# Patient Record
Sex: Female | Born: 1945 | Race: Black or African American | Hispanic: No | Marital: Married | Smoking: Former smoker
Health system: Southern US, Community
[De-identification: ages and names within clinical notes are randomized; demographics above are authoritative.]

## PROBLEM LIST (undated history)

## (undated) DIAGNOSIS — I10 Essential (primary) hypertension: Secondary | ICD-10-CM

## (undated) DIAGNOSIS — K219 Gastro-esophageal reflux disease without esophagitis: Secondary | ICD-10-CM

## (undated) DIAGNOSIS — M542 Cervicalgia: Secondary | ICD-10-CM

## (undated) DIAGNOSIS — M549 Dorsalgia, unspecified: Secondary | ICD-10-CM

## (undated) DIAGNOSIS — M1990 Unspecified osteoarthritis, unspecified site: Secondary | ICD-10-CM

## (undated) DIAGNOSIS — R42 Dizziness and giddiness: Secondary | ICD-10-CM

## (undated) DIAGNOSIS — Z9289 Personal history of other medical treatment: Secondary | ICD-10-CM

## (undated) DIAGNOSIS — J449 Chronic obstructive pulmonary disease, unspecified: Secondary | ICD-10-CM

## (undated) DIAGNOSIS — C801 Malignant (primary) neoplasm, unspecified: Secondary | ICD-10-CM

## (undated) DIAGNOSIS — R0602 Shortness of breath: Secondary | ICD-10-CM

## (undated) DIAGNOSIS — R51 Headache: Secondary | ICD-10-CM

## (undated) DIAGNOSIS — Z8719 Personal history of other diseases of the digestive system: Secondary | ICD-10-CM

## (undated) DIAGNOSIS — G8929 Other chronic pain: Secondary | ICD-10-CM

## (undated) DIAGNOSIS — Z87442 Personal history of urinary calculi: Secondary | ICD-10-CM

## (undated) DIAGNOSIS — R059 Cough, unspecified: Secondary | ICD-10-CM

## (undated) DIAGNOSIS — R002 Palpitations: Secondary | ICD-10-CM

## (undated) DIAGNOSIS — R0600 Dyspnea, unspecified: Secondary | ICD-10-CM

## (undated) DIAGNOSIS — F419 Anxiety disorder, unspecified: Secondary | ICD-10-CM

## (undated) DIAGNOSIS — IMO0002 Reserved for concepts with insufficient information to code with codable children: Secondary | ICD-10-CM

## (undated) DIAGNOSIS — R05 Cough: Secondary | ICD-10-CM

## (undated) HISTORY — PX: COLONOSCOPY: SHX174

## (undated) HISTORY — PX: ABDOMINAL HYSTERECTOMY: SHX81

## (undated) HISTORY — PX: EYE SURGERY: SHX253

## (undated) HISTORY — PX: CHOLECYSTECTOMY: SHX55

## (undated) HISTORY — PX: OTHER SURGICAL HISTORY: SHX169

## (undated) HISTORY — PX: FRACTURE SURGERY: SHX138

## (undated) HISTORY — PX: TONSILLECTOMY: SUR1361

## (undated) HISTORY — PX: KNEE SURGERY: SHX244

---

## 1998-04-17 ENCOUNTER — Ambulatory Visit (HOSPITAL_COMMUNITY): Admission: RE | Admit: 1998-04-17 | Discharge: 1998-04-17 | Payer: Self-pay | Admitting: Gastroenterology

## 1998-04-28 ENCOUNTER — Emergency Department (HOSPITAL_COMMUNITY): Admission: EM | Admit: 1998-04-28 | Discharge: 1998-04-28 | Payer: Self-pay | Admitting: Emergency Medicine

## 1998-05-30 ENCOUNTER — Ambulatory Visit (HOSPITAL_BASED_OUTPATIENT_CLINIC_OR_DEPARTMENT_OTHER): Admission: RE | Admit: 1998-05-30 | Discharge: 1998-05-30 | Payer: Self-pay | Admitting: Urology

## 1998-08-21 ENCOUNTER — Ambulatory Visit (HOSPITAL_COMMUNITY): Admission: RE | Admit: 1998-08-21 | Discharge: 1998-08-21 | Payer: Self-pay | Admitting: Cardiovascular Disease

## 1998-09-01 ENCOUNTER — Ambulatory Visit (HOSPITAL_COMMUNITY): Admission: RE | Admit: 1998-09-01 | Discharge: 1998-09-01 | Payer: Self-pay | Admitting: Neurosurgery

## 1998-09-01 ENCOUNTER — Encounter: Payer: Self-pay | Admitting: Neurosurgery

## 1998-09-04 ENCOUNTER — Other Ambulatory Visit: Admission: RE | Admit: 1998-09-04 | Discharge: 1998-09-04 | Payer: Self-pay | Admitting: Obstetrics

## 1998-09-13 ENCOUNTER — Encounter: Payer: Self-pay | Admitting: Urology

## 1998-09-13 ENCOUNTER — Ambulatory Visit (HOSPITAL_COMMUNITY): Admission: RE | Admit: 1998-09-13 | Discharge: 1998-09-13 | Payer: Self-pay | Admitting: Urology

## 1998-10-28 ENCOUNTER — Encounter: Admission: RE | Admit: 1998-10-28 | Discharge: 1998-12-11 | Payer: Self-pay | Admitting: Neurosurgery

## 1999-01-22 ENCOUNTER — Ambulatory Visit (HOSPITAL_BASED_OUTPATIENT_CLINIC_OR_DEPARTMENT_OTHER): Admission: RE | Admit: 1999-01-22 | Discharge: 1999-01-22 | Payer: Self-pay | Admitting: *Deleted

## 1999-10-22 ENCOUNTER — Other Ambulatory Visit: Admission: RE | Admit: 1999-10-22 | Discharge: 1999-10-22 | Payer: Self-pay | Admitting: Urology

## 2000-02-09 ENCOUNTER — Encounter: Payer: Self-pay | Admitting: Neurosurgery

## 2000-02-09 ENCOUNTER — Encounter: Admission: RE | Admit: 2000-02-09 | Discharge: 2000-02-09 | Payer: Self-pay | Admitting: Neurosurgery

## 2000-02-13 ENCOUNTER — Encounter: Admission: RE | Admit: 2000-02-13 | Discharge: 2000-03-23 | Payer: Self-pay | Admitting: Neurosurgery

## 2000-04-21 ENCOUNTER — Encounter: Payer: Self-pay | Admitting: Cardiovascular Disease

## 2000-04-21 ENCOUNTER — Encounter: Admission: RE | Admit: 2000-04-21 | Discharge: 2000-04-21 | Payer: Self-pay | Admitting: Cardiovascular Disease

## 2000-09-21 ENCOUNTER — Ambulatory Visit (HOSPITAL_COMMUNITY): Admission: RE | Admit: 2000-09-21 | Discharge: 2000-09-21 | Payer: Self-pay | Admitting: Gastroenterology

## 2000-10-04 ENCOUNTER — Emergency Department (HOSPITAL_COMMUNITY): Admission: EM | Admit: 2000-10-04 | Discharge: 2000-10-05 | Payer: Self-pay | Admitting: Emergency Medicine

## 2000-10-04 ENCOUNTER — Encounter: Payer: Self-pay | Admitting: Gastroenterology

## 2000-10-04 ENCOUNTER — Encounter: Admission: RE | Admit: 2000-10-04 | Discharge: 2000-10-04 | Payer: Self-pay | Admitting: Gastroenterology

## 2000-10-05 ENCOUNTER — Encounter: Payer: Self-pay | Admitting: Emergency Medicine

## 2000-11-24 ENCOUNTER — Encounter: Admission: RE | Admit: 2000-11-24 | Discharge: 2000-11-24 | Payer: Self-pay | Admitting: Cardiovascular Disease

## 2000-11-24 ENCOUNTER — Encounter: Payer: Self-pay | Admitting: Cardiovascular Disease

## 2001-01-19 ENCOUNTER — Encounter: Payer: Self-pay | Admitting: Emergency Medicine

## 2001-01-19 ENCOUNTER — Emergency Department (HOSPITAL_COMMUNITY): Admission: EM | Admit: 2001-01-19 | Discharge: 2001-01-20 | Payer: Self-pay | Admitting: Emergency Medicine

## 2001-09-27 ENCOUNTER — Encounter: Payer: Self-pay | Admitting: Gastroenterology

## 2001-09-27 ENCOUNTER — Ambulatory Visit (HOSPITAL_COMMUNITY): Admission: RE | Admit: 2001-09-27 | Discharge: 2001-09-27 | Payer: Self-pay | Admitting: Gastroenterology

## 2001-11-01 ENCOUNTER — Encounter (INDEPENDENT_AMBULATORY_CARE_PROVIDER_SITE_OTHER): Payer: Self-pay

## 2001-11-01 ENCOUNTER — Observation Stay (HOSPITAL_COMMUNITY): Admission: RE | Admit: 2001-11-01 | Discharge: 2001-11-02 | Payer: Self-pay | Admitting: Surgery

## 2001-12-26 ENCOUNTER — Encounter: Admission: RE | Admit: 2001-12-26 | Discharge: 2001-12-26 | Payer: Self-pay | Admitting: Gastroenterology

## 2001-12-26 ENCOUNTER — Encounter: Payer: Self-pay | Admitting: Gastroenterology

## 2002-11-23 HISTORY — PX: KNEE ARTHROSCOPY: SHX127

## 2003-05-16 ENCOUNTER — Encounter: Admission: RE | Admit: 2003-05-16 | Discharge: 2003-05-16 | Payer: Self-pay | Admitting: Gastroenterology

## 2003-05-16 ENCOUNTER — Encounter: Payer: Self-pay | Admitting: Gastroenterology

## 2003-11-04 ENCOUNTER — Emergency Department (HOSPITAL_COMMUNITY): Admission: EM | Admit: 2003-11-04 | Discharge: 2003-11-04 | Payer: Self-pay | Admitting: Emergency Medicine

## 2003-11-22 ENCOUNTER — Emergency Department (HOSPITAL_COMMUNITY): Admission: EM | Admit: 2003-11-22 | Discharge: 2003-11-23 | Payer: Self-pay | Admitting: Emergency Medicine

## 2004-02-02 ENCOUNTER — Emergency Department (HOSPITAL_COMMUNITY): Admission: EM | Admit: 2004-02-02 | Discharge: 2004-02-03 | Payer: Self-pay | Admitting: Emergency Medicine

## 2004-05-10 ENCOUNTER — Emergency Department (HOSPITAL_COMMUNITY): Admission: EM | Admit: 2004-05-10 | Discharge: 2004-05-10 | Payer: Self-pay | Admitting: Family Medicine

## 2004-06-13 ENCOUNTER — Emergency Department (HOSPITAL_COMMUNITY): Admission: EM | Admit: 2004-06-13 | Discharge: 2004-06-14 | Payer: Self-pay | Admitting: Emergency Medicine

## 2004-06-17 ENCOUNTER — Encounter: Admission: RE | Admit: 2004-06-17 | Discharge: 2004-06-17 | Payer: Self-pay | Admitting: Cardiovascular Disease

## 2004-06-24 ENCOUNTER — Encounter: Admission: RE | Admit: 2004-06-24 | Discharge: 2004-06-24 | Payer: Self-pay | Admitting: Cardiovascular Disease

## 2005-04-20 ENCOUNTER — Emergency Department (HOSPITAL_COMMUNITY): Admission: EM | Admit: 2005-04-20 | Discharge: 2005-04-21 | Payer: Self-pay | Admitting: Emergency Medicine

## 2005-05-08 ENCOUNTER — Encounter: Admission: RE | Admit: 2005-05-08 | Discharge: 2005-05-08 | Payer: Self-pay | Admitting: Neurosurgery

## 2005-05-16 ENCOUNTER — Emergency Department (HOSPITAL_COMMUNITY): Admission: EM | Admit: 2005-05-16 | Discharge: 2005-05-16 | Payer: Self-pay | Admitting: Family Medicine

## 2005-09-19 ENCOUNTER — Emergency Department (HOSPITAL_COMMUNITY): Admission: EM | Admit: 2005-09-19 | Discharge: 2005-09-19 | Payer: Self-pay | Admitting: *Deleted

## 2005-11-23 ENCOUNTER — Emergency Department (HOSPITAL_COMMUNITY): Admission: EM | Admit: 2005-11-23 | Discharge: 2005-11-23 | Payer: Self-pay | Admitting: Family Medicine

## 2006-07-25 ENCOUNTER — Emergency Department (HOSPITAL_COMMUNITY): Admission: EM | Admit: 2006-07-25 | Discharge: 2006-07-25 | Payer: Self-pay | Admitting: Emergency Medicine

## 2006-09-15 ENCOUNTER — Encounter (INDEPENDENT_AMBULATORY_CARE_PROVIDER_SITE_OTHER): Payer: Self-pay | Admitting: Specialist

## 2006-09-15 ENCOUNTER — Ambulatory Visit (HOSPITAL_COMMUNITY): Admission: RE | Admit: 2006-09-15 | Discharge: 2006-09-15 | Payer: Self-pay | Admitting: Gastroenterology

## 2006-10-23 ENCOUNTER — Emergency Department (HOSPITAL_COMMUNITY): Admission: EM | Admit: 2006-10-23 | Discharge: 2006-10-23 | Payer: Self-pay | Admitting: Family Medicine

## 2006-12-01 ENCOUNTER — Emergency Department (HOSPITAL_COMMUNITY): Admission: EM | Admit: 2006-12-01 | Discharge: 2006-12-01 | Payer: Self-pay | Admitting: Emergency Medicine

## 2006-12-15 ENCOUNTER — Inpatient Hospital Stay (HOSPITAL_COMMUNITY): Admission: EM | Admit: 2006-12-15 | Discharge: 2006-12-16 | Payer: Self-pay | Admitting: Emergency Medicine

## 2007-07-31 ENCOUNTER — Encounter: Admission: RE | Admit: 2007-07-31 | Discharge: 2007-07-31 | Payer: Self-pay | Admitting: Neurosurgery

## 2007-08-14 ENCOUNTER — Encounter: Admission: RE | Admit: 2007-08-14 | Discharge: 2007-08-14 | Payer: Self-pay | Admitting: Neurosurgery

## 2007-08-25 ENCOUNTER — Encounter: Admission: RE | Admit: 2007-08-25 | Discharge: 2007-08-25 | Payer: Self-pay | Admitting: Cardiovascular Disease

## 2007-09-16 ENCOUNTER — Ambulatory Visit (HOSPITAL_BASED_OUTPATIENT_CLINIC_OR_DEPARTMENT_OTHER): Admission: RE | Admit: 2007-09-16 | Discharge: 2007-09-16 | Payer: Self-pay | Admitting: Urology

## 2007-09-23 ENCOUNTER — Ambulatory Visit: Payer: Self-pay | Admitting: *Deleted

## 2007-09-23 ENCOUNTER — Encounter (INDEPENDENT_AMBULATORY_CARE_PROVIDER_SITE_OTHER): Payer: Self-pay | Admitting: Cardiovascular Disease

## 2007-09-23 ENCOUNTER — Ambulatory Visit (HOSPITAL_COMMUNITY): Admission: RE | Admit: 2007-09-23 | Discharge: 2007-09-23 | Payer: Self-pay | Admitting: Cardiovascular Disease

## 2007-09-27 ENCOUNTER — Inpatient Hospital Stay (HOSPITAL_COMMUNITY): Admission: EM | Admit: 2007-09-27 | Discharge: 2007-09-29 | Payer: Self-pay | Admitting: Emergency Medicine

## 2008-04-30 ENCOUNTER — Emergency Department (HOSPITAL_COMMUNITY): Admission: EM | Admit: 2008-04-30 | Discharge: 2008-04-30 | Payer: Self-pay | Admitting: Emergency Medicine

## 2008-05-12 ENCOUNTER — Emergency Department (HOSPITAL_COMMUNITY): Admission: EM | Admit: 2008-05-12 | Discharge: 2008-05-12 | Payer: Self-pay | Admitting: Emergency Medicine

## 2008-07-13 ENCOUNTER — Emergency Department (HOSPITAL_COMMUNITY): Admission: EM | Admit: 2008-07-13 | Discharge: 2008-07-14 | Payer: Self-pay | Admitting: Plastic Surgery

## 2008-07-22 ENCOUNTER — Emergency Department (HOSPITAL_COMMUNITY): Admission: EM | Admit: 2008-07-22 | Discharge: 2008-07-22 | Payer: Self-pay | Admitting: Family Medicine

## 2008-10-06 ENCOUNTER — Emergency Department (HOSPITAL_COMMUNITY): Admission: EM | Admit: 2008-10-06 | Discharge: 2008-10-06 | Payer: Self-pay | Admitting: Emergency Medicine

## 2008-11-23 DIAGNOSIS — Z9289 Personal history of other medical treatment: Secondary | ICD-10-CM

## 2008-11-23 HISTORY — DX: Personal history of other medical treatment: Z92.89

## 2008-12-09 ENCOUNTER — Emergency Department (HOSPITAL_COMMUNITY): Admission: EM | Admit: 2008-12-09 | Discharge: 2008-12-09 | Payer: Self-pay | Admitting: Emergency Medicine

## 2008-12-31 ENCOUNTER — Inpatient Hospital Stay (HOSPITAL_COMMUNITY): Admission: AD | Admit: 2008-12-31 | Discharge: 2009-01-02 | Payer: Self-pay | Admitting: Cardiovascular Disease

## 2009-01-01 ENCOUNTER — Encounter (INDEPENDENT_AMBULATORY_CARE_PROVIDER_SITE_OTHER): Payer: Self-pay | Admitting: Cardiovascular Disease

## 2009-01-15 ENCOUNTER — Encounter: Admission: RE | Admit: 2009-01-15 | Discharge: 2009-01-15 | Payer: Self-pay | Admitting: Cardiovascular Disease

## 2009-04-11 ENCOUNTER — Encounter: Admission: RE | Admit: 2009-04-11 | Discharge: 2009-04-11 | Payer: Self-pay

## 2009-04-23 ENCOUNTER — Encounter: Admission: RE | Admit: 2009-04-23 | Discharge: 2009-05-16 | Payer: Self-pay | Admitting: Neurosurgery

## 2009-08-26 ENCOUNTER — Encounter: Admission: RE | Admit: 2009-08-26 | Discharge: 2009-08-26 | Payer: Self-pay | Admitting: Unknown Physician Specialty

## 2010-02-20 ENCOUNTER — Ambulatory Visit (HOSPITAL_BASED_OUTPATIENT_CLINIC_OR_DEPARTMENT_OTHER): Admission: RE | Admit: 2010-02-20 | Discharge: 2010-02-20 | Payer: Self-pay | Admitting: General Surgery

## 2010-04-03 ENCOUNTER — Emergency Department (HOSPITAL_COMMUNITY): Admission: EM | Admit: 2010-04-03 | Discharge: 2010-04-03 | Payer: Self-pay | Admitting: Emergency Medicine

## 2010-11-08 ENCOUNTER — Emergency Department (HOSPITAL_COMMUNITY)
Admission: EM | Admit: 2010-11-08 | Discharge: 2010-11-08 | Payer: Self-pay | Source: Home / Self Care | Admitting: Emergency Medicine

## 2010-12-11 ENCOUNTER — Encounter
Admission: RE | Admit: 2010-12-11 | Discharge: 2010-12-11 | Payer: Self-pay | Source: Home / Self Care | Attending: Specialist | Admitting: Specialist

## 2010-12-17 ENCOUNTER — Encounter
Admission: RE | Admit: 2010-12-17 | Discharge: 2010-12-23 | Payer: Self-pay | Source: Home / Self Care | Attending: Specialist | Admitting: Specialist

## 2010-12-24 ENCOUNTER — Ambulatory Visit: Payer: Medicare Other | Attending: Physical Medicine and Rehabilitation | Admitting: Rehabilitation

## 2010-12-24 DIAGNOSIS — M2560 Stiffness of unspecified joint, not elsewhere classified: Secondary | ICD-10-CM | POA: Insufficient documentation

## 2010-12-24 DIAGNOSIS — IMO0001 Reserved for inherently not codable concepts without codable children: Secondary | ICD-10-CM | POA: Insufficient documentation

## 2010-12-24 DIAGNOSIS — M2550 Pain in unspecified joint: Secondary | ICD-10-CM | POA: Insufficient documentation

## 2010-12-25 ENCOUNTER — Ambulatory Visit: Payer: Medicare Other | Admitting: Rehabilitation

## 2010-12-30 ENCOUNTER — Ambulatory Visit: Payer: Medicare Other | Admitting: Rehabilitation

## 2011-01-01 ENCOUNTER — Encounter: Payer: Medicare Other | Admitting: Rehabilitation

## 2011-01-06 ENCOUNTER — Ambulatory Visit: Payer: Medicare Other | Admitting: Rehabilitation

## 2011-01-08 ENCOUNTER — Encounter: Payer: Medicare Other | Admitting: Rehabilitation

## 2011-01-14 ENCOUNTER — Ambulatory Visit: Payer: Medicare Other | Admitting: Rehabilitation

## 2011-01-15 ENCOUNTER — Ambulatory Visit: Payer: Medicare Other | Admitting: Rehabilitation

## 2011-01-19 ENCOUNTER — Emergency Department (HOSPITAL_COMMUNITY)
Admission: EM | Admit: 2011-01-19 | Discharge: 2011-01-20 | Disposition: A | Discharge status: Home / Self Care | Payer: Medicare Other | Attending: Emergency Medicine | Admitting: Emergency Medicine

## 2011-01-19 DIAGNOSIS — E119 Type 2 diabetes mellitus without complications: Secondary | ICD-10-CM | POA: Insufficient documentation

## 2011-01-19 DIAGNOSIS — I10 Essential (primary) hypertension: Secondary | ICD-10-CM | POA: Insufficient documentation

## 2011-01-19 DIAGNOSIS — R42 Dizziness and giddiness: Secondary | ICD-10-CM | POA: Insufficient documentation

## 2011-01-19 DIAGNOSIS — R51 Headache: Secondary | ICD-10-CM | POA: Insufficient documentation

## 2011-01-19 DIAGNOSIS — K219 Gastro-esophageal reflux disease without esophagitis: Secondary | ICD-10-CM | POA: Insufficient documentation

## 2011-01-19 DIAGNOSIS — Z79899 Other long term (current) drug therapy: Secondary | ICD-10-CM | POA: Insufficient documentation

## 2011-01-20 ENCOUNTER — Ambulatory Visit: Payer: Medicare Other | Admitting: Rehabilitation

## 2011-01-20 ENCOUNTER — Emergency Department (HOSPITAL_COMMUNITY): Payer: Medicare Other

## 2011-01-20 LAB — DIFFERENTIAL
Eosinophils Absolute: 0.3 10*3/uL (ref 0.0–0.7)
Eosinophils Relative: 3 % (ref 0–5)
Lymphocytes Relative: 40 % (ref 12–46)
Lymphs Abs: 4.1 10*3/uL — ABNORMAL HIGH (ref 0.7–4.0)
Monocytes Absolute: 0.4 10*3/uL (ref 0.1–1.0)
Monocytes Relative: 4 % (ref 3–12)

## 2011-01-20 LAB — CBC
HCT: 35.6 % — ABNORMAL LOW (ref 36.0–46.0)
MCH: 29.3 pg (ref 26.0–34.0)
MCV: 89 fL (ref 78.0–100.0)
RDW: 12.6 % (ref 11.5–15.5)
WBC: 10.2 10*3/uL (ref 4.0–10.5)

## 2011-01-20 LAB — BASIC METABOLIC PANEL
BUN: 12 mg/dL (ref 6–23)
Creatinine, Ser: 1.03 mg/dL (ref 0.4–1.2)
GFR calc non Af Amer: 54 mL/min — ABNORMAL LOW (ref >60)
Glucose, Bld: 134 mg/dL — ABNORMAL HIGH (ref 70–99)
Potassium: 3.8 mEq/L (ref 3.5–5.1)

## 2011-01-22 ENCOUNTER — Encounter: Payer: Medicare Other | Admitting: Rehabilitation

## 2011-01-27 ENCOUNTER — Ambulatory Visit: Payer: Medicare Other | Attending: Specialist | Admitting: Rehabilitation

## 2011-01-27 DIAGNOSIS — M2550 Pain in unspecified joint: Secondary | ICD-10-CM | POA: Insufficient documentation

## 2011-01-27 DIAGNOSIS — IMO0001 Reserved for inherently not codable concepts without codable children: Secondary | ICD-10-CM | POA: Insufficient documentation

## 2011-01-27 DIAGNOSIS — M2560 Stiffness of unspecified joint, not elsewhere classified: Secondary | ICD-10-CM | POA: Insufficient documentation

## 2011-01-29 ENCOUNTER — Ambulatory Visit: Payer: Medicare Other | Admitting: Rehabilitation

## 2011-02-03 ENCOUNTER — Ambulatory Visit: Payer: Medicare Other | Admitting: Rehabilitation

## 2011-02-05 ENCOUNTER — Ambulatory Visit: Payer: Medicare Other | Admitting: Rehabilitation

## 2011-02-10 ENCOUNTER — Encounter: Payer: Medicare Other | Admitting: Rehabilitation

## 2011-02-12 ENCOUNTER — Ambulatory Visit: Payer: Medicare Other | Admitting: Rehabilitation

## 2011-02-17 ENCOUNTER — Ambulatory Visit: Payer: Medicare Other | Admitting: Rehabilitation

## 2011-02-19 ENCOUNTER — Encounter: Payer: Medicare Other | Admitting: Rehabilitation

## 2011-02-24 ENCOUNTER — Encounter: Payer: Medicare Other | Admitting: Rehabilitation

## 2011-02-26 ENCOUNTER — Encounter: Payer: Medicare Other | Admitting: Rehabilitation

## 2011-03-09 LAB — COMPREHENSIVE METABOLIC PANEL
ALT: 10 U/L (ref 0–35)
AST: 17 U/L (ref 0–37)
Albumin: 3.8 g/dL (ref 3.5–5.2)
Alkaline Phosphatase: 116 U/L (ref 39–117)
BUN: 13 mg/dL (ref 6–23)
CO2: 24 mEq/L (ref 19–32)
Calcium: 9 mg/dL (ref 8.4–10.5)
Chloride: 98 mEq/L (ref 96–112)
Creatinine, Ser: 1.22 mg/dL — ABNORMAL HIGH (ref 0.4–1.2)
GFR calc Af Amer: 54 mL/min — ABNORMAL LOW (ref >60)
GFR calc non Af Amer: 45 mL/min — ABNORMAL LOW (ref >60)
Glucose, Bld: 137 mg/dL — ABNORMAL HIGH (ref 70–99)
Potassium: 3.1 mEq/L — ABNORMAL LOW (ref 3.5–5.1)
Sodium: 133 mEq/L — ABNORMAL LOW (ref 135–145)
Total Bilirubin: 0.4 mg/dL (ref 0.3–1.2)
Total Protein: 7.4 g/dL (ref 6.0–8.3)

## 2011-03-09 LAB — DIFFERENTIAL
Basophils Absolute: 0.1 10*3/uL (ref 0.0–0.1)
Basophils Relative: 1 % (ref 0–1)
Eosinophils Absolute: 0.4 10*3/uL (ref 0.0–0.7)
Eosinophils Relative: 4 % (ref 0–5)
Lymphocytes Relative: 31 % (ref 12–46)
Lymphs Abs: 3.2 10*3/uL (ref 0.7–4.0)
Monocytes Absolute: 0.6 10*3/uL (ref 0.1–1.0)
Monocytes Relative: 6 % (ref 3–12)
Neutro Abs: 6.1 10*3/uL (ref 1.7–7.7)
Neutrophils Relative %: 58 % (ref 43–77)

## 2011-03-09 LAB — URINE MICROSCOPIC-ADD ON

## 2011-03-09 LAB — URINALYSIS, ROUTINE W REFLEX MICROSCOPIC
Glucose, UA: NEGATIVE mg/dL
Ketones, ur: 15 mg/dL — AB
Nitrite: NEGATIVE
Protein, ur: NEGATIVE mg/dL
Specific Gravity, Urine: 1.024 (ref 1.005–1.030)
Urobilinogen, UA: 1 mg/dL (ref 0.0–1.0)
pH: 5.5 (ref 5.0–8.0)

## 2011-03-09 LAB — CBC
HCT: 38.2 % (ref 36.0–46.0)
Hemoglobin: 12.9 g/dL (ref 12.0–15.0)
MCHC: 33.9 g/dL (ref 30.0–36.0)
MCV: 86.9 fL (ref 78.0–100.0)
Platelets: 394 10*3/uL (ref 150–400)
RBC: 4.39 MIL/uL (ref 3.87–5.11)
RDW: 13.5 % (ref 11.5–15.5)
WBC: 10.4 10*3/uL (ref 4.0–10.5)

## 2011-03-10 LAB — CARDIAC PANEL(CRET KIN+CKTOT+MB+TROPI)
CK, MB: 1.1 ng/mL (ref 0.3–4.0)
Relative Index: 0.8 (ref 0.0–2.5)
Relative Index: 1 (ref 0.0–2.5)
Relative Index: INVALID (ref 0.0–2.5)
Total CK: 110 U/L (ref 7–177)
Troponin I: <0.01 ng/mL (ref 0.00–0.06)
Troponin I: <0.01 ng/mL (ref 0.00–0.06)

## 2011-03-10 LAB — CBC
HCT: 37 % (ref 36.0–46.0)
HCT: 37.7 % (ref 36.0–46.0)
Hemoglobin: 11.5 g/dL — ABNORMAL LOW (ref 12.0–15.0)
Hemoglobin: 12.5 g/dL (ref 12.0–15.0)
Hemoglobin: 12.7 g/dL (ref 12.0–15.0)
MCHC: 34.3 g/dL (ref 30.0–36.0)
MCV: 87.9 fL (ref 78.0–100.0)
MCV: 88 fL (ref 78.0–100.0)
Platelets: 382 10*3/uL (ref 150–400)
RBC: 3.8 MIL/uL — ABNORMAL LOW (ref 3.87–5.11)
RBC: 4.21 MIL/uL (ref 3.87–5.11)
RDW: 13.8 % (ref 11.5–15.5)
RDW: 14.2 % (ref 11.5–15.5)
WBC: 8.1 10*3/uL (ref 4.0–10.5)
WBC: 9.1 10*3/uL (ref 4.0–10.5)

## 2011-03-10 LAB — LIPID PANEL: VLDL: 15 mg/dL (ref 0–40)

## 2011-03-10 LAB — GLUCOSE, CAPILLARY
Glucose-Capillary: 103 mg/dL — ABNORMAL HIGH (ref 70–99)
Glucose-Capillary: 124 mg/dL — ABNORMAL HIGH (ref 70–99)
Glucose-Capillary: 126 mg/dL — ABNORMAL HIGH (ref 70–99)

## 2011-03-10 LAB — DIFFERENTIAL
Basophils Absolute: 0 10*3/uL (ref 0.0–0.1)
Eosinophils Relative: 1 % (ref 0–5)
Lymphocytes Relative: 36 % (ref 12–46)
Lymphs Abs: 3.2 10*3/uL (ref 0.7–4.0)
Monocytes Absolute: 0.4 10*3/uL (ref 0.1–1.0)
Neutro Abs: 5.4 10*3/uL (ref 1.7–7.7)

## 2011-03-10 LAB — BASIC METABOLIC PANEL
Calcium: 9.4 mg/dL (ref 8.4–10.5)
Chloride: 101 mEq/L (ref 96–112)
Creatinine, Ser: 1.06 mg/dL (ref 0.4–1.2)
GFR calc Af Amer: >60 mL/min (ref >60)
GFR calc Af Amer: >60 mL/min (ref >60)
GFR calc non Af Amer: 60 mL/min — ABNORMAL LOW (ref >60)
Potassium: 3.3 mEq/L — ABNORMAL LOW (ref 3.5–5.1)
Potassium: 4 mEq/L (ref 3.5–5.1)
Sodium: 137 mEq/L (ref 135–145)
Sodium: 137 mEq/L (ref 135–145)

## 2011-03-10 LAB — HEMOGLOBIN A1C: Mean Plasma Glucose: 128 mg/dL

## 2011-03-10 LAB — MAGNESIUM: Magnesium: 2.2 mg/dL (ref 1.5–2.5)

## 2011-03-10 LAB — PROTIME-INR: Prothrombin Time: 13.2 seconds (ref 11.6–15.2)

## 2011-03-10 LAB — HEPARIN LEVEL (UNFRACTIONATED): Heparin Unfractionated: 0.31 IU/mL (ref 0.30–0.70)

## 2011-03-10 LAB — TSH: TSH: 0.824 u[IU]/mL (ref 0.350–4.500)

## 2011-03-10 LAB — HEPARIN ANTI-XA: Heparin LMW: 0.37 IU/mL

## 2011-04-07 NOTE — Discharge Summary (Signed)
[name], [name]                   ACCOUNT NO.:  [id number]   MEDICAL RECORD NO.:  [id number]          PATIENT TYPE:  INP   LOCATION:  2036                         FACILITY:  [facility]   PHYSICIAN:  [name], M.D.  DATE OF BIRTH:  07-14-1946   DATE OF ADMISSION:  09/27/2007  DATE OF DISCHARGE:  09/29/2007                               DISCHARGE SUMMARY   FINAL DIAGNOSES:  1. Chest pain, noncardiac.  2. Hypertension.  3. Cervical disk disease.  4. Lumbar disk disease.  5. Obesity.   DISCHARGE MEDICATIONS:  1. Hyzaar 100/12.5 mg half daily.  2. K-Dur 20 mEq 2 daily.  3. Norvasc 2.5 mg 1 daily.  4. Metformin 500 mg 1 daily.  5. Flexeril 10 mg half twice daily.  6. Vicodin 5/500 mg 1 twice daily as needed.   CONDITION ON DISCHARGE:  Improved.   FOLLOW UP:  Dr. Orpah Cobb in 1 month.   HISTORY:  This 65 year old black female was admitted for chest pain and  left arm numbness.  The patient has risk factors of hypertension and  diabetes.   PHYSICAL EXAMINATION:  GENERAL:  Patient is a well-developed, well-  nourished black female in no acute distress.  HEENT:  Grossly unremarkable.  Eyes brown.  Conjunctivae pink.  Sclerae  are white.  NECK:.  No JVD, no carotid bruit.  LUNGS:  Clear bilaterally.  HEART:  Normal S1-S2.  ABDOMEN:  Soft and nontender.  EXTREMITIES:  No edema, cyanosis, clubbing.  CNS:  The patient moves all 4 extremities and has bilateral equal grips.   LABORATORY DATA:  Essentially unremarkable.   DIAGNOSTICS:  1. Nuclear stress test:  No reversible ischemia with normal left      ventricular systolic function.  2. EKG:  Normal sinus rhythm.   HOSPITAL COURSE:  The patient was admitted to telemetry unit.  Myocardial infarction was ruled out.  She underwent nuclear stress test  that failed to show any reversible ischemia.  The patient was discharged  home in satisfactory condition with adjustment in her medications and  follow up by me in 1  month.      [name], M.D.  Electronically Signed     ASK/MEDQ  D:  09/29/2007  T:  09/29/2007  Job:  [id number]

## 2011-04-07 NOTE — Op Note (Signed)
NAMESUKHMAN, KOCHER                   ACCOUNT NO.:  [id number]   MEDICAL RECORD NO.:  [id number]          PATIENT TYPE:  AMB   LOCATION:  NESC                         FACILITY:  [facility]   PHYSICIAN:  Lindaann Slough, M.D.  DATE OF BIRTH:  Mar 22, 1946   DATE OF PROCEDURE:  09/16/2007  DATE OF DISCHARGE:                               OPERATIVE REPORT   PREOPERATIVE DIAGNOSES:  1. Pelvic pain.  2. Meatal stenosis.   POSTOPERATIVE DIAGNOSES:  1. Pelvic pain.  2. Meatal stenosis.   PROCEDURES:  1. Cystoscopy.  2. HOD.  3. Meatal dilation.   SURGEON:  Danae Chen, M.D.   ANESTHESIA:  General.   INDICATIONS:  The patient is a 65 year old female who has been  complaining of pelvic pain and hesitancy, decreased force of the urinary  stream, straining on urination.  She was treated for urinary tract  infection; however, her symptoms have persisted.  She is scheduled today  for cystoscopy and meatal dilation and bladder distention.   Under general anesthesia, the patient was prepped and draped and placed  in the dorsal lithotomy position.  A panendoscope was inserted in the  bladder.  The bladder mucosa is normal.  There is no stone or tumor in  the bladder.  The ureteral orifices are in normal position and shape  with clear efflux.  There is no evidence of submucosal hemorrhage.  The  bladder was then distended for about 5 minutes and the bladder capacity  was 1200 mL.  The cystoscope was then removed.  The urethra was dilated  up to a #32-French.  Then bimanual examination was done and there was no  evidence of pelvic mass.   The patient tolerated the procedure well and left the OR in satisfactory  condition to post anesthesia care unit.   The patient was sent home on Pyridium Plus one tablet 3 times a day, and  she will be followed in the office.      Lindaann Slough, M.D.  Electronically Signed    MN/MEDQ  D:  09/16/2007  T:  09/17/2007  Job:  [id number]

## 2011-04-10 NOTE — Op Note (Signed)
[name], [name]                   ACCOUNT NO.:  [id number]   MEDICAL RECORD NO.:  [id number]          PATIENT TYPE:  AMB   LOCATION:  ENDO                         FACILITY:  [facility]   PHYSICIAN:  Anselmo Rod, M.D.  DATE OF BIRTH:  07/14/1946   DATE OF PROCEDURE:  09/15/2006  DATE OF DISCHARGE:  09/15/2006                                 OPERATIVE REPORT   PROCEDURE PERFORMED:  Colonoscopy with cold biopsies x2.   ENDOSCOPIST:  Anselmo Rod, M.D.   INSTRUMENT USED:  Olympus video colonoscope.   INDICATION FOR PROCEDURE:  [age]-year-old African American female with  a personal history of tubular adenomas, undergoing a surveillance  colonoscopy to rule out colon polyps, masses, etc.   PREPROCEDURE PREPARATION:  Informed consent was procured from the patient.  The patient was fasted for 8 hours prior to the procedure and prepped with  Dulcolax pills and a gallon of TriLyte the night prior to the procedure.  Risks and benefits of the procedure including a 10% missed rate of cancer in  polyps were discussed with the patient as well.   PREPROCEDURE PHYSICAL:  VITAL SIGNS:  The patient had stable vital signs.  NECK:  Supple.  CHEST:  Clear to auscultation.  CARDIAC:  S1 and S2 regular.  ABDOMEN:  Soft with normal bowel sounds.   DESCRIPTION OF THE PROCEDURE:  The patient was placed in the left lateral  decubitus position and sedated with 125 mcg of fentanyl and 12.5 mg of  Versed in slow incremental doses.  Once the patient was adequately sedate  and maintained on low-flow oxygen and continuous cardiac monitoring, the  Olympus video colonoscope was advanced from the rectum to the cecum.  There  was some residual stool in the colon; multiple washings were done.  Two  polyps were removed from the left colon (cold biopsies x2).  The rest of the  colonic mucosa appeared healthy.  Prominent internal hemorrhoids were seen  on retroflexion.  An erythematous spot was seen in the  mid right colon; this  is not seem typical of an AVM, but was not biopsied for fear of bleeding.  The patient tolerated the procedure well without complications.   IMPRESSION:  1. Prominent internal hemorrhoids.  2. Small erythematous spot in the mid right colon, not biopsied.  3. Left colon polyps removed by cone biopsies x2.   RECOMMENDATIONS:  1. Await pathology results.  2. Avoid all nonsteroidals including aspirin for now.  3. Repeat colonoscopy, depending on pathology results.  4. Outpatient followup as need arises in the future.      Anselmo Rod, M.D.  Electronically Signed     JNM/MEDQ  D:  09/15/2006  T:  09/17/2006  Job:  [id number]   cc:   [name], M.D.

## 2011-04-10 NOTE — Op Note (Signed)
[facility]  Patient:    [name], BARRE Visit Number: [id number] MRN: [id number]          Service Type: SUR Location: 3W 0375 02 Attending Physician:  Shelly Rubenstein Dictated by:   Abigail Miyamoto, M.D. Proc. Date: 11/01/01 Admit Date:  11/01/2001   CC:         Anselmo Rod, M.D.   Operative Report  PREOPERATIVE DIAGNOSIS:  Biliary dyskinesia.  POSTOPERATIVE DIAGNOSIS:  Biliary dyskinesia.  OPERATION:  Laparoscopic cholecystectomy.  SURGEON:  Abigail Miyamoto, M.D.  ANESTHESIA:  General endotracheal anesthesia, 0.25% Marcaine.  ESTIMATED BLOOD LOSS:  Minimal.  DESCRIPTION OF PROCEDURE:  The patient was brought to the operating room and identified as Salome Arnt.  She was placed on the operating table and general anesthesia was induced.  Her abdomen was then prepped and draped in the usual sterile fashion.  Using a #15 blade a small vertical incision was made below the umbilicus through a previous incision.  The incision was carried down through the fascia which was then opened with the scalpel.  A hemostat was then used to pass through to the peritoneal cavity.  A 0 Vicryl pursestring suture was placed around the fascial opening.  The Hasson port was then placed through the opening and insufflation of the abdomen as begun.  An 11 mm port was placed in the patients epigastrium and two 5 mm ports placed in the patients right flank under direct vision.  The gallbladder was then grasped and retracted out of the liver bed.  Dissection was then carried out at the base.  The cystic duct was dissected out and clipped three times proximally and once distally and transected with the scissors.  The cystic artery was then identified and clipped twice proximally and once distally and transected as well.  The gallbladder was then dissected free from the liver bed with the electrocautery.  The gallbladder was entered during the dissection and bile was  aspirated from the gallbladder.  Once the gallbladder was completely removed, it was placed in an Endosac and removed from the umbilicus.  Again the liver was examined and hemostasis was achieved.  The abdomen was then irrigated with normal saline.  All ports were then removed under direct vision and the abdomen was deflated.  All incisions were anesthetized with 0.25% Marcaine and closed with 4-0 Monocryl subcuticular sutures.  Prior to this, the 4-0 Vicryl at the umbilicus was tied and placed closing the fascial defect.  Steri-Strips and Tegaderm were applied to all incisions.  The patient tolerated the procedure well.  All sponge, needle and instrument counts were correct at the end of the procedure.  The patient was then extubated in the operating room and taken in stable condition to the recovery room. Dictated by:   Abigail Miyamoto, M.D. Attending Physician:  Shelly Rubenstein DD:  11/01/01 TD:  11/01/01 Job: [id number]

## 2011-04-10 NOTE — Discharge Summary (Signed)
[name], [name]                   ACCOUNT NO.:  [id number]   MEDICAL RECORD NO.:  [id number]          PATIENT TYPE:  INP   LOCATION:  2036                         FACILITY:  [facility]   PHYSICIAN:  [name], M.D.  DATE OF BIRTH:  05/22/1946   DATE OF ADMISSION:  09/27/2007  DATE OF DISCHARGE:  09/29/2007                               DISCHARGE SUMMARY   FINAL DIAGNOSES:  1. Chest pain.  2. Hypertension.  3. Diabetes mellitus type 2.  4. Tobacco use disorder.  5. Marked obesity.  6. Hyperlipidemia.  7. Esophageal reflux disease.   DISCHARGE MEDICATIONS:  1. Hyzaar 100/12.5 mg one-half daily.  2. K-Dur 20 mEq 2 daily.  3. Nexium 40 mg 1 daily.  4. Vicodin 5/50 one b.i.d. p.r.n.  5. Flexeril 10 mg one-half b.i.d.  6. Metformin 500 mg daily.  7. Norvasc 2.5 mg daily.   DISCHARGE DIET:  Low-sodium, heart-healthy diet.   DISCHARGE ACTIVITY:  Patient to increase activity slowly.   SPECIAL INSTRUCTIONS:  Patient to stop any activity that causes chest  pain, shortness of breath, dizziness, sweating, or excessive weakness.   CONDITION ON DISCHARGE:  Improved.   FOLLOWUP:  Dr. Orpah Cobb in 1 month.  Patient to call [phone] for  appointment.   HISTORY:  This is a 65 year old black female who presented with  retrosternal chest pain radiating to the left side of neck and left arm.   PHYSICAL EXAMINATION:  GENERAL:  The patient was alert, oriented x3, and  in no acute distress.  VITAL SIGNS:  Blood pressure 115/52, pulse 85 and regular.  HEENT:  Conjunctivae pink.  NECK:  Supple.  No jugular venous distention.  LUNGS:  Clear bilaterally.  HEART:  Normal S1 and S2 and a soft systolic murmur.  ABDOMEN:  Soft.  Bowel sounds present.  Nontender.  EXTREMITIES:  Negative edema, cyanosis, or clubbing.   LABORATORY DATA:  Normal hemoglobin and hematocrit.  White blood cell  count slightly elevated at 11,500, and platelet count slightly elevated  at 426,000.  Subsequent WBC  count and platelet counts were normal.  PT  and INR were normal.  Electrolytes were normal except for a slightly low  potassium of 3.2.  Subsequent potassium of 3.9.  Liver enzymes were near  normal with a slightly low albumin of 3.2.  Hemoglobin A1c was slightly  elevated at 6.4.  Cardiac enzymes were normal x3.  Cholesterol level was  also normal except for a slightly high LDL cholesterol of 114.   Her EKG was normal sinus rhythm.   Stress test was without reversible ischemia.   Peripheral vascular study of lower extremity was without any evidence of  deep venous thrombosis with Baker cyst.   Chest x-ray was normal in no acute finding.   HOSPITAL COURSE:  The patient was admitted to telemetry unit.  Myocardial infarction was ruled out.  She underwent nuclear stress test  that failed to show any reversible ischemia; hence, the patient was  discharged home in satisfactory condition with adjustment of medications  and to be followed by me  in 1 month.      [name], M.D.  Electronically Signed     ASK/MEDQ  D:  11/26/2007  T:  11/26/2007  Job:  [id number]

## 2011-04-10 NOTE — H&P (Signed)
[name], [name]                   ACCOUNT NO.:  [id number]   MEDICAL RECORD NO.:  [id number]          PATIENT TYPE:  INP   LOCATION:  3707                         FACILITY:  [facility]   PHYSICIAN:  [name], M.D.  DATE OF BIRTH:  1946-08-16   DATE OF ADMISSION:  12/15/2006  DATE OF DISCHARGE:                              HISTORY & PHYSICAL   HISTORY:  Back pain with fever and urinary symptoms in spite of taking  Cipro x10 days for pyelonephritis.  The patient denies nausea or  vomiting.  Has history of back pain.   PAST MEDICAL HISTORY:  Negative for diabetes.  Positive for hypertension  for 10 years.  Positive for smoking half a pack of cigarettes per day  for 30 years.  No history of alcohol intake or drug abuse.   PAST SURGICAL HISTORY:  1. Gallbladder surgery in 2000.  2. Right shoulder surgery, 2002.  3. Right knee arthroscopic surgery in 2004.  4. Cervical spine fusion in 1998.  5. Partial hysterectomy in 1998.   PERSONAL HISTORY:  The patient is married x1 for 30 years.  Her husband  Leory Plowman is 54 years old and healthy.  The patient has to 2 sons and 3  daughters.   FAMILY HISTORY:  Mother living at age 63, father living at age 29.  The  patient has 3 brothers and 6 sisters, all living and well.   MEDICATIONS:  1. Hyzaar 100/25 mg one daily.  2. Sular 10 mg one daily.  3. Lipitor 10 mg one daily.  4. Nexium 40 mg one daily.  5. K-Dur 10 mEq twice daily.  6. Vicodin 5/500 mg one twice daily as needed.   ALLERGIES:  None.   REVIEW OF SYSTEMS:  The patient denies recent weight gain or weight  loss.  Wears glasses.  No dentures.  Wears wigs.  Has crowns on the  front teeth.  No history of hemoptysis or hematemesis.  Positive history  of cough, occasional chest pain.  No palpitation.  No leg edema.  No  nausea, vomiting, diarrhea or constipation.  Ano history of hepatitis,  blood transfusion, kidney stones, strokes, seizures or psychiatric  admissions.   PHYSICAL EXAMINATION:  GENERAL:  The patient is a well-built, well-  nourished black female in no acute distress.  The patient is alert and  oriented x3.  VITAL SIGNS:  Pulse 80, respirations 14, blood pressure 140/70.  Height  5 feet 7 inches.  Weight 220 pounds.  Temperature 98 degree F.  HEENT:  The patient is normocephalic, atraumatic, has brown eyes, wears  glasses, has crowns on the front teeth and also wears a wig.  NECK:  No JVD, no carotid bruit.  Full range of motion.  Mild tenderness  over the back of the neck.  LUNGS:  Clear bilaterally.  HEART:  Normal S1 and S2 with grade 2/6 systolic murmur.  ABDOMEN:  Soft.  There is scar of gallbladder surgery.  BACK:  Bilateral CVA tenderness.  EXTREMITIES:  No edema or cyanosis, but positive clubbing.  CNS:  Cranial nerves  grossly intact.  The patient has bilateral equal  grips.  She is right-handed.   LABORATORY DATA:  Pending.   IMPRESSION:  1. Pyelonephritis.  2. Back pain.  3. Cervical disk disease.  4. Hypertension.  5. Tobacco use disorder.   PLAN:  To admit the patient to telemetry unit and get blood cultures,  check urinalysis and C&S, start IV Rocephin.      [name], M.D.  Electronically Signed     ASK/MEDQ  D:  12/15/2006  T:  12/16/2006  Job:  [id number]

## 2011-04-10 NOTE — Discharge Summary (Signed)
NAMEMELLISA, [name]                   ACCOUNT NO.:  [id number]   MEDICAL RECORD NO.:  [id number]          PATIENT TYPE:  INP   LOCATION:  3705                         FACILITY:  [facility]   PHYSICIAN:  [name], M.D.  DATE OF BIRTH:  Sep 05, 1946   DATE OF ADMISSION:  12/31/2008  DATE OF DISCHARGE:  01/02/2009                               DISCHARGE SUMMARY   FINAL DIAGNOSES:  1. Native vessel coronary atherosclerosis.  2. Diabetes mellitus, type 2.  3. Hypertension.  4. Asthma.  5. Lumbar disk disease.  6. Obesity.  7. Hyperlipidemia.  8. Family history of ischemic heart disease.   PRINCIPAL PROCEDURES:  1. Left heart catheterization.  2. Selective coronary angiography.  3. Left renal function study, done by Dr. Orpah Cobb on January 01, 2009.   DISCHARGE MEDICATIONS:  1. Hyzaar 100/12.5 mg 1 daily.  2. KCl (potassium pill) 20 mEq 2 daily.  3. Nexium 40 mg 1 daily.  4. Lipitor 40 mg 1 in the evening.  5. Hydrocodone 5/500 mg 1 twice daily as needed.  6. Metformin 500 mg 1 twice daily.  7. Claritin 10 mg 1 daily.  8. Mucinex 600 mg 1 twice daily.   DISCHARGE DIET:  Low-sodium heart-healthy diet.   DISCHARGE ACTIVITY:  1. The patient is to increase activity slowly and stop any activity      that causes chest pain, shortness of breath, dizziness, sweating,      or excessive weakness.  2. The patient is to notify right groin pain, swelling, or discharge.  3. Followup by Dr. Orpah Cobb in 1 month.  The patient is to call      [phone] for appointment.   HISTORY:  This 65 year old black female presented with chest pain, left  sided, radiating to left arm without nausea, vomiting, or shortness of  breath.   PAST MEDICAL HISTORY:  Positive for:  1. Diabetes.  2. Hypertension.  3. Cervical disc disease.  4. Arthritis.   PHYSICAL EXAMINATION:  VITAL SIGNS:  Temperature 98, pulse 68,  respirations 15, blood pressure 150/80, height 5 feet 7 inches, weight  216  pounds.  GENERAL:  The patient is well-built, well-nourished black female, in  mild distress.  HEENT:  The patient is normocephalic, atraumatic with pupils equally  reacting to light.  Extraocular movement intact.  Conjunctivae pink and  sclerae nonicteric.  Eyes brown in color.  NECK:  Supple.  No JVD.  LUNGS:  Clear bilaterally.  HEART:  Normal S1 and S2.  ABDOMEN:  Soft and nontender but distended.  EXTREMITIES:  No edema, cyanosis, or clubbing.  NEUROLOGIC:  The patient is alert and oriented x3.  Cranial nerves  grossly intact.   LABORATORY DATA:  Normal hemoglobin/hematocrit, WBC count, platelet  count.  Normal electrolytes, BUN, creatinine.  B natriuretic peptide  less than 30.  INR 1.0.  Potassium after supplement was up to 4.0 mEq/L.  Hemoglobin A1c 6.1.  Cardiac enzymes negative x3.  Lipid profile  essentially normal except for LDL cholesterol of 125.  Thyroid  stimulating  hormone was 0.8.   Chest x-ray, no active lung disease.   EKG revealed normal sinus rhythm.   Cardiac catheterization revealed mild multivessel native vessel coronary  artery disease with a normal LV systolic function.   HOSPITAL COURSE:  The patient was admitted to telemetry unit.  She  underwent cardiac catheterization that showed mild multivessel native  vessel coronary artery disease.  Her medications were adjusted, and she  was discharged home in satisfactory condition with a followup by me in 1  month.      [name], M.D.  Electronically Signed     ASK/MEDQ  D:  02/14/2009  T:  02/14/2009  Job:  [id number]

## 2011-04-10 NOTE — Discharge Summary (Signed)
[name], [name]                   ACCOUNT NO.:  [id number]   MEDICAL RECORD NO.:  [id number]          PATIENT TYPE:  INP   LOCATION:  3707                         FACILITY:  [facility]   PHYSICIAN:  [name], M.D.  DATE OF BIRTH:  1946/10/03   DATE OF ADMISSION:  12/15/2006  DATE OF DISCHARGE:  12/16/2006                               DISCHARGE SUMMARY   FINAL DIAGNOSES:  1. Pyelonephritis.  2. Back pain.  3. Cervical disk disease.  4. Hypertension.  5. Tobacco use disorder.  6. Obesity.   DISCHARGE MEDICATIONS:  1. Vicodin 5/500 mg, one 4 times daily.  2. Hyzaar 100 stress 12.5 mg one daily.  3. Sular 10 mg one daily.  4. Potassium 20 mEq, two daily.  5. Nexium 40 mg, one daily.  6. Fibercon one scoop in 8-ounce water, once or twice daily.  7. Flexeril 10 mg half or 1 tablet twice daily.   DISCHARGE ACTIVITY:  The patient to increase activity slowly.   DISCHARGE DIET:  Low sodium heart healthy diet.   FOLLOW-UP:  By Dr. Orpah Cobb in 2 weeks.  The patient will call [phone] for appointment and Dr. Cabbell/neurosurgery in 1-2 weeks.   HISTORY:  This 65 year old black female presented with a fever and  urinary symptoms, in spite of taking Cipro for 10 days for  pyelonephritis. The patient has a history of back pain.  The patient  denied any nausea, vomiting.  She does not have any history of diabetes  or alcohol intake or drug abuse, but she has a history of hypertension  for 10 years and smoking half a pack of cigarettes per day for 30 years.   PHYSICAL EXAMINATION:  GENERAL:  The patient is well-built, well-  nourished black female in no acute distress.  She is alert, oriented x3,  pulse 80, respirations 14, blood pressure 140x70, height 5 feet 7  inches, weight 220 pounds, temperature 98 degrees Fahrenheit.  HEENT:  The patient is normocephalic, atraumatic with brown eyes.  Conjunctivae pink.  Sclerae are white.  She wears glasses, has crowns on  the front  teeth and also wears a wig.  NECK:  No JVD, no carotid bruits, has full range of motion.  Mild  tenderness over the back of the neck.  LUNGS:  Clear bilaterally.  HEART:  Normal S1-S2 with grade 2/6 systolic murmur.  ABDOMEN:  Soft with a scar of gallbladder surgery.  BACK:  Bilateral costovertebral angle stent tenderness.  EXTREMITIES:  No edema or cyanosis, mild clubbing.  CNS:  Cranial nerves grossly intact.  The patient has bilateral equal  grips and his right-handed.   LABORATORY DATA:  Revealed normal hemoglobin, hematocrit, WBC count,  platelet count near normal at 403,000.  PT/INR normal, electrolytes near  normal with a potassium of 3.1 and a urinalysis negative for nitrites  and WBC.   HOSPITAL COURSE:  The patient was admitted to regular floor.  She was  started on IV Rocephin 1 gram daily.  Her condition improved and 24  hours. Recheck on urinalysis did not  show any nitrites or leukocytes;  hence, on December 16, 2006 she was discharged home with pain medications  and follow-up by a neurosurgeon for her chronic degenerative disk  disease of the cervical and lumbar spine.      [name], M.D.  Electronically Signed     ASK/MEDQ  D:  02/18/2007  T:  02/18/2007  Job:  [id number]

## 2011-07-13 ENCOUNTER — Other Ambulatory Visit: Payer: Self-pay | Admitting: Cardiovascular Disease

## 2011-07-13 ENCOUNTER — Ambulatory Visit
Admission: RE | Admit: 2011-07-13 | Discharge: 2011-07-13 | Disposition: A | Discharge status: Home / Self Care | Payer: Medicare Other | Source: Ambulatory Visit | Attending: Cardiovascular Disease | Admitting: Cardiovascular Disease

## 2011-07-13 DIAGNOSIS — R109 Unspecified abdominal pain: Secondary | ICD-10-CM

## 2011-07-16 ENCOUNTER — Encounter (HOSPITAL_COMMUNITY)
Admission: RE | Admit: 2011-07-16 | Discharge: 2011-07-16 | Disposition: A | Discharge status: Home / Self Care | Payer: Medicare Other | Source: Ambulatory Visit | Attending: Orthopaedic Surgery | Admitting: Orthopaedic Surgery

## 2011-07-16 ENCOUNTER — Ambulatory Visit (HOSPITAL_COMMUNITY)
Admission: RE | Admit: 2011-07-16 | Discharge: 2011-07-16 | Disposition: A | Discharge status: Home / Self Care | Payer: Medicare Other | Source: Ambulatory Visit | Attending: Orthopaedic Surgery | Admitting: Orthopaedic Surgery

## 2011-07-16 ENCOUNTER — Other Ambulatory Visit (HOSPITAL_COMMUNITY): Payer: Self-pay | Admitting: Orthopaedic Surgery

## 2011-07-16 DIAGNOSIS — M75101 Unspecified rotator cuff tear or rupture of right shoulder, not specified as traumatic: Secondary | ICD-10-CM

## 2011-07-16 DIAGNOSIS — I10 Essential (primary) hypertension: Secondary | ICD-10-CM | POA: Insufficient documentation

## 2011-07-16 DIAGNOSIS — S43429A Sprain of unspecified rotator cuff capsule, initial encounter: Secondary | ICD-10-CM | POA: Insufficient documentation

## 2011-07-16 DIAGNOSIS — X58XXXA Exposure to other specified factors, initial encounter: Secondary | ICD-10-CM | POA: Insufficient documentation

## 2011-07-16 DIAGNOSIS — Z01818 Encounter for other preprocedural examination: Secondary | ICD-10-CM | POA: Insufficient documentation

## 2011-07-16 DIAGNOSIS — Z01812 Encounter for preprocedural laboratory examination: Secondary | ICD-10-CM | POA: Insufficient documentation

## 2011-07-16 LAB — CBC
HCT: 39.4 % (ref 36.0–46.0)
Hemoglobin: 13.4 g/dL (ref 12.0–15.0)
MCH: 30 pg (ref 26.0–34.0)
MCHC: 34 g/dL (ref 30.0–36.0)
MCV: 88.3 fL (ref 78.0–100.0)
RDW: 13.3 % (ref 11.5–15.5)

## 2011-07-16 LAB — BASIC METABOLIC PANEL
BUN: 9 mg/dL (ref 6–23)
Calcium: 10.1 mg/dL (ref 8.4–10.5)
Creatinine, Ser: 1.08 mg/dL (ref 0.50–1.10)
GFR calc Af Amer: >60 mL/min (ref >60)
GFR calc non Af Amer: 51 mL/min — ABNORMAL LOW (ref >60)
Glucose, Bld: 119 mg/dL — ABNORMAL HIGH (ref 70–99)

## 2011-07-20 HISTORY — PX: SHOULDER OPEN ROTATOR CUFF REPAIR: SHX2407

## 2011-07-21 ENCOUNTER — Ambulatory Visit (HOSPITAL_COMMUNITY)
Admission: RE | Admit: 2011-07-21 | Discharge: 2011-07-22 | Disposition: A | Discharge status: Home / Self Care | Payer: Medicare Other | Source: Ambulatory Visit | Attending: Orthopaedic Surgery | Admitting: Orthopaedic Surgery

## 2011-07-21 DIAGNOSIS — F17200 Nicotine dependence, unspecified, uncomplicated: Secondary | ICD-10-CM | POA: Insufficient documentation

## 2011-07-21 DIAGNOSIS — M75110 Incomplete rotator cuff tear or rupture of unspecified shoulder, not specified as traumatic: Secondary | ICD-10-CM | POA: Insufficient documentation

## 2011-07-21 DIAGNOSIS — N390 Urinary tract infection, site not specified: Secondary | ICD-10-CM | POA: Insufficient documentation

## 2011-07-21 DIAGNOSIS — I10 Essential (primary) hypertension: Secondary | ICD-10-CM | POA: Insufficient documentation

## 2011-07-21 DIAGNOSIS — M25819 Other specified joint disorders, unspecified shoulder: Secondary | ICD-10-CM | POA: Insufficient documentation

## 2011-07-21 DIAGNOSIS — E119 Type 2 diabetes mellitus without complications: Secondary | ICD-10-CM | POA: Insufficient documentation

## 2011-07-21 DIAGNOSIS — K219 Gastro-esophageal reflux disease without esophagitis: Secondary | ICD-10-CM | POA: Insufficient documentation

## 2011-07-21 DIAGNOSIS — E669 Obesity, unspecified: Secondary | ICD-10-CM | POA: Insufficient documentation

## 2011-07-21 DIAGNOSIS — M25519 Pain in unspecified shoulder: Secondary | ICD-10-CM | POA: Insufficient documentation

## 2011-07-21 LAB — GLUCOSE, CAPILLARY
Glucose-Capillary: 98 mg/dL (ref 70–99)
Glucose-Capillary: 107 mg/dL — ABNORMAL HIGH (ref 70–99)
Glucose-Capillary: 110 mg/dL — ABNORMAL HIGH (ref 70–99)

## 2011-07-22 LAB — GLUCOSE, CAPILLARY
Glucose-Capillary: 124 mg/dL — ABNORMAL HIGH (ref 70–99)
Glucose-Capillary: 102 mg/dL — ABNORMAL HIGH (ref 70–99)

## 2011-07-28 NOTE — Op Note (Signed)
NAMESHELLE, [name] NO.:  [id number]  MEDICAL RECORD NO.:  [id number]  LOCATION:  5022                         FACILITY:  [facility]  PHYSICIAN:  [name], M.D.DATE OF BIRTH:  18-Aug-1946  DATE OF PROCEDURE:  07/21/2011 DATE OF DISCHARGE:  07/16/2011                              OPERATIVE REPORT   PREOPERATIVE DIAGNOSIS:  Left shoulder partial thickness rotator cuff tear and impingement syndrome.  POSTOPERATIVE DIAGNOSIS:  Left shoulder partial thickness rotator cuff tear and impingement syndrome.  PROCEDURE:  Left shoulder arthroscopy with extensive debridement and subacromial decompression.  SURGEON:  Kirsten Panda. Magnus Ivan, MD.  ANESTHESIA: 1. General. 2. Local with 0.25% plain Sensorcaine, clonidine, and 0.25%     Sensorcaine with epi, 4 mg of morphine, 1 mg of clonidine.  BLOOD LOSS:  Minimal.  COMPLICATIONS:  None.  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  She has had problems with overhead activities and even had a fall and MRI back in March did show partial thickness rotator cuff tear and significant impingement with type III acromion.  We have tried a series of injections, rest, antiinflammatories, ice, head, and time and things were just not improving her shoulder.  At this point, she wished to proceed with an arthroscopic intervention.  The risks and benefits of the surgery was explained to her in detail, and she did wish to proceed with surgery.  DESCRIPTION OF PROCEDURE:  After informed consent was obtained, appropriate left shoulder was marked.  She was brought to the operating room, placed supine on the operating table.  General anesthesia was then obtained.  She was then fashioned in a beach chair position with appropriate positioning of the head and neck and padding was done on the operative right arm.  There was bending at the waist and knees and palpable pulses in her  feet.  Her left arm was then placed in in-line skeletal traction using fishing pole traction device with 10 pounds of traction.  It was prepped and draped with DuraPrep and drapes already. A time-out was called. She identify the correct patient and correct left shoulder.  I then made a posterolateral arthroscopic portal and entered the glenohumeral joint.  Right away, you could see there was significant inflamed tissue in the anterior aspect of her shoulder and at least partial thickness articular surface of the rotator cuff tearing. Anterior portal was then made between the biceps tendon and subscapularis tendon and a soft tissue ablation wand was carried outside.  I performed debridement of inflamed tissue throughout the glenohumeral joint including debriding the undersurface of the rotator cuff.  Next, I entered the subacromial space through the posterior portal and then made separate lateral portal.  There was significant type III acromion with bone spurs and bursitis throughout the subacromial space.  Using a soft tissue ablation wand and arthroscopic shavers, I was able to debride this sterile.  I then performed a subacromial decompression with partial acromioplasty.  We gave adequate space of the rotator cuff and found the bursal surface to just have some fraying but there was no full thickness tear.  I then allowed  __________ shoulder and then removed all instrumentation.  I closed the portal sites with interrupted nylon sutures.  Xeroform followed by well-padded sterile dressing was applied and the patient's arm was placed on a sling.  She was awakened, extubated, and taken to recovery room in stable condition.  She will be admitted for observation.  All final counts were correct, and there were no complications noted.     [name], M.D.     CYB/MEDQ  D:  07/21/2011  T:  07/21/2011  Job:  [id number]  Electronically Signed by Doneen Poisson M.D. on  07/28/2011 12:34:26 PM

## 2011-07-28 NOTE — H&P (Signed)
  NAMEARAIYA, TILMON                   ACCOUNT NO.:  [id number]  MEDICAL RECORD NO.:  [id number]  LOCATION:  [facility]SC                         FACILITY:  [facility]  PHYSICIAN:  Vanita Panda. Magnus Ivan, M.D.DATE OF BIRTH:  [birth year]  DATE OF ADMISSION:  07/21/2011 DATE OF DISCHARGE:                             HISTORY & PHYSICAL   CHIEF COMPLAINT:  Severe left shoulder pain.  HISTORY OF PRESENT ILLNESS:  Ms. Pizzini is someone well known to me.  She has had severe left shoulder pain for some time.  We did obtain an MRI in the past in March of 2012 that did show a type III acromion with significant bursitis, but there was also moderate tendinopathy with intrasubstance partial thickness tear of the supraspinatus and infraspinatus tendons of the rotator cuff.  We have tried a series of injections in her shoulder.  Physical Therapy, rest, and anti- inflammatory, and her shoulder is just not getting better.  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  At this point with the failure of conservative treatment, she wishes to proceed with an operative intervention.  PAST MEDICAL HISTORY: 1. Diabetes type II. 2. High blood pressure. 3. Acid reflux. 4. Arthritis.  CURRENT MEDICATIONS: 1. Metformin. 2. Losartan. 3. Flexeril.  DRUG ALLERGIES:  None.  PREVIOUS SURGERIES:  She has had arthroscopic surgery on her right knee before.  FAMILY MEDICAL HISTORY:  Diabetes, heart disease, and high blood pressure.  SOCIAL HISTORY:  She is married.  She does smoke for the last 40 years and does not drink alcohol.  REVIEW OF SYSTEMS:  Negative for chest pain, shortness of breath, fevers, chills, nausea, and vomiting.  PHYSICAL EXAMINATION:  VITAL SIGNS:  She is afebrile with stable vital signs and her vital signs can be seen on healthcare encounter form here at the bedside. GENERAL:  She is alert, oriented x3, in no acute distress. HEENT:  Normocephalic, atraumatic.  Pupils are equal,  round, reactive to light.  Extraocular movements are intact. NECK:  Supple. LUNGS:  Clear to auscultation bilaterally. HEART:  Regular rate and rhythm. ABDOMEN:  Benign. EXTREMITIES:  Her left shoulder has severe pain with throughout herarc of motion with positive Neer and Hawkin signs.  There is no weakness of the rotator cuff.  IMPRESSION:  This is a 65 year old female with left shoulder pain and impingement with partial thickness rotator cuff tear.  PLAN:  Given the failure of conservative treatment, we will proceed with an operative intervention today of her left shoulder which would include arthroscopy with subacromial decompression, debridement, and repair of the rotator cuff if necessary.  She understands the risks and benefits of this and does wish to proceed with surgery today.     Vanita Panda. Magnus Ivan, M.D.     CYB/MEDQ  D:  07/21/2011  T:  07/21/2011  Job:  [id number]  Electronically Signed by Doneen Poisson M.D. on 07/28/2011 12:34:24 PM

## 2011-07-28 NOTE — Discharge Summary (Signed)
  [name], [name] NO.:  [id number]  MEDICAL RECORD NO.:  [id number]  LOCATION:  5022                         FACILITY:  [facility]  PHYSICIAN:  Vanita Panda. Magnus Ivan, M.D.DATE OF BIRTH:  11-20-1946  DATE OF ADMISSION:  07/21/2011 DATE OF DISCHARGE:  07/22/2011                              DISCHARGE SUMMARY   ADMITTING DIAGNOSIS:  Partial-thickness rotator cuff tear, left shoulder and impingement syndrome.  DISCHARGE DIAGNOSIS:  Partial-thickness rotator cuff tear, left shoulder and impingement syndrome.  PROCEDURE:  Left shoulder arthroscopy with debridement and subacromial decompression on the day of admission.  HOSPITAL COURSE:  [name] is a 65 year old female with debilitating impingement of her left shoulder as well as partial-thickness rotator cuff tear.  She has had multiple injections, antiinflammatories, rest, time, and physical therapy and this has not helped for her.  She wishes to proceed with shoulder intervention at this point.  She was taken to the operating room on the day of admission where she underwent a successful left shoulder arthroscopy.  She was admitted for 23-hour observation for pain control purposes.  On postoperative day 1, she was much more comfortable.  Her incisions were clean, dry, and intact.  It was felt she can be discharged safe to home.  DISPOSITION:  Discharged to home.  DISCHARGE INSTRUCTIONS:  While she is at home, she can get incisions wet.  She can wear sling but she can come out for range of motion of her shoulder.  A followup appointment is establishment in our office in a week.  DISCHARGE MEDICATIONS:  Percocet as needed for pain.     Vanita Panda. Magnus Ivan, M.D.     CYB/MEDQ  D:  07/22/2011  T:  07/22/2011  Job:  [id number]  Electronically Signed by Doneen Poisson M.D. on 07/28/2011 12:34:28 PM

## 2011-08-20 LAB — POCT URINALYSIS DIP (DEVICE)
Bilirubin Urine: NEGATIVE
Nitrite: NEGATIVE
Protein, ur: NEGATIVE
pH: 7

## 2011-09-01 LAB — CBC
HCT: 35.3 — ABNORMAL LOW
HCT: 36.3
HCT: 40.8
HCT: 41.5
Hemoglobin: 12.2
Hemoglobin: 13.8
MCV: 87.4
MCV: 87.7
MCV: 89.1
Platelets: 318
Platelets: 410 — ABNORMAL HIGH
RBC: 4.14
RBC: 4.67
RBC: 4.69
WBC: 11.2 — ABNORMAL HIGH
WBC: 11.5 — ABNORMAL HIGH
WBC: 8
WBC: 8

## 2011-09-01 LAB — POCT CARDIAC MARKERS
CKMB, poc: 1.3
Myoglobin, poc: 111
Operator id: 257131
Operator id: 257131
Operator id: 257131
Troponin i, poc: <0.05

## 2011-09-01 LAB — I-STAT 8, (EC8 V) (CONVERTED LAB)
BUN: 22
Bicarbonate: 27.5 — ABNORMAL HIGH
Glucose, Bld: 113 — ABNORMAL HIGH
Sodium: 138
TCO2: 29
pCO2, Ven: 41.2 — ABNORMAL LOW

## 2011-09-01 LAB — BASIC METABOLIC PANEL
BUN: 20
CO2: 28
Chloride: 105
Creatinine, Ser: 1.06

## 2011-09-01 LAB — COMPREHENSIVE METABOLIC PANEL
ALT: 20
AST: 35
Albumin: 3.2 — ABNORMAL LOW
Calcium: 9
Chloride: 104
Creatinine, Ser: 1.13
GFR calc Af Amer: 59 — ABNORMAL LOW
Sodium: 141
Total Bilirubin: 1

## 2011-09-01 LAB — DIFFERENTIAL
Eosinophils Absolute: 0.1
Eosinophils Relative: 1
Lymphocytes Relative: 32
Lymphs Abs: 3.7 — ABNORMAL HIGH
Monocytes Absolute: 0.4
Monocytes Relative: 4

## 2011-09-01 LAB — CARDIAC PANEL(CRET KIN+CKTOT+MB+TROPI): Troponin I: 0.02

## 2011-09-01 LAB — CK TOTAL AND CKMB (NOT AT ARMC): Relative Index: 1.1

## 2011-09-01 LAB — TROPONIN I: Troponin I: 0.03

## 2011-09-01 LAB — LIPID PANEL
HDL: 44
Total CHOL/HDL Ratio: 3.8

## 2011-09-01 LAB — PROTIME-INR
INR: 0.9
Prothrombin Time: 12.2

## 2011-09-01 LAB — HEPARIN LEVEL (UNFRACTIONATED): Heparin Unfractionated: 0.52

## 2011-09-01 LAB — POCT I-STAT CREATININE: Creatinine, Ser: 1.2

## 2011-09-02 LAB — POCT I-STAT 4, (NA,K, GLUC, HGB,HCT)
Hemoglobin: 16 — ABNORMAL HIGH
Potassium: 4

## 2011-09-22 ENCOUNTER — Ambulatory Visit
Admission: RE | Admit: 2011-09-22 | Discharge: 2011-09-22 | Disposition: A | Discharge status: Home / Self Care | Payer: Medicare Other | Source: Ambulatory Visit | Attending: Cardiovascular Disease | Admitting: Cardiovascular Disease

## 2011-09-22 ENCOUNTER — Other Ambulatory Visit: Payer: Self-pay | Admitting: Cardiovascular Disease

## 2011-09-22 DIAGNOSIS — J40 Bronchitis, not specified as acute or chronic: Secondary | ICD-10-CM

## 2011-11-07 ENCOUNTER — Emergency Department (HOSPITAL_COMMUNITY): Payer: Medicare Other

## 2011-11-07 ENCOUNTER — Emergency Department (HOSPITAL_COMMUNITY)
Admission: EM | Admit: 2011-11-07 | Discharge: 2011-11-07 | Disposition: A | Discharge status: Home / Self Care | Payer: Medicare Other | Attending: Emergency Medicine | Admitting: Emergency Medicine

## 2011-11-07 ENCOUNTER — Other Ambulatory Visit: Payer: Self-pay

## 2011-11-07 DIAGNOSIS — Z79899 Other long term (current) drug therapy: Secondary | ICD-10-CM | POA: Insufficient documentation

## 2011-11-07 DIAGNOSIS — Z9889 Other specified postprocedural states: Secondary | ICD-10-CM | POA: Insufficient documentation

## 2011-11-07 DIAGNOSIS — R209 Unspecified disturbances of skin sensation: Secondary | ICD-10-CM | POA: Insufficient documentation

## 2011-11-07 DIAGNOSIS — K219 Gastro-esophageal reflux disease without esophagitis: Secondary | ICD-10-CM | POA: Insufficient documentation

## 2011-11-07 DIAGNOSIS — E119 Type 2 diabetes mellitus without complications: Secondary | ICD-10-CM | POA: Insufficient documentation

## 2011-11-07 DIAGNOSIS — R200 Anesthesia of skin: Secondary | ICD-10-CM

## 2011-11-07 DIAGNOSIS — I10 Essential (primary) hypertension: Secondary | ICD-10-CM | POA: Insufficient documentation

## 2011-11-07 DIAGNOSIS — M129 Arthropathy, unspecified: Secondary | ICD-10-CM | POA: Insufficient documentation

## 2011-11-07 DIAGNOSIS — F17200 Nicotine dependence, unspecified, uncomplicated: Secondary | ICD-10-CM | POA: Insufficient documentation

## 2011-11-07 HISTORY — DX: Reserved for concepts with insufficient information to code with codable children: IMO0002

## 2011-11-07 HISTORY — DX: Unspecified osteoarthritis, unspecified site: M19.90

## 2011-11-07 HISTORY — DX: Essential (primary) hypertension: I10

## 2011-11-07 HISTORY — DX: Gastro-esophageal reflux disease without esophagitis: K21.9

## 2011-11-07 LAB — COMPREHENSIVE METABOLIC PANEL
ALT: 17 U/L (ref 0–35)
AST: 19 U/L (ref 0–37)
Albumin: 3.7 g/dL (ref 3.5–5.2)
CO2: 28 mEq/L (ref 19–32)
Calcium: 9.8 mg/dL (ref 8.4–10.5)
Chloride: 98 mEq/L (ref 96–112)
Creatinine, Ser: 1.11 mg/dL — ABNORMAL HIGH (ref 0.50–1.10)
GFR calc non Af Amer: 51 mL/min — ABNORMAL LOW (ref >90)
Sodium: 137 mEq/L (ref 135–145)

## 2011-11-07 LAB — CBC
MCHC: 34.2 g/dL (ref 30.0–36.0)
Platelets: 412 10*3/uL — ABNORMAL HIGH (ref 150–400)
RDW: 12.9 % (ref 11.5–15.5)
WBC: 9.2 10*3/uL (ref 4.0–10.5)

## 2011-11-07 LAB — DIFFERENTIAL
Basophils Absolute: 0 10*3/uL (ref 0.0–0.1)
Basophils Relative: 0 % (ref 0–1)
Lymphocytes Relative: 33 % (ref 12–46)
Monocytes Absolute: 0.5 10*3/uL (ref 0.1–1.0)
Neutro Abs: 5.2 10*3/uL (ref 1.7–7.7)

## 2011-11-07 LAB — URINE MICROSCOPIC-ADD ON

## 2011-11-07 LAB — URINALYSIS, ROUTINE W REFLEX MICROSCOPIC
Bilirubin Urine: NEGATIVE
Glucose, UA: NEGATIVE mg/dL
Specific Gravity, Urine: 1.009 (ref 1.005–1.030)
Urobilinogen, UA: 0.2 mg/dL (ref 0.0–1.0)

## 2011-11-07 NOTE — ED Notes (Signed)
Pt returned from ct

## 2011-11-07 NOTE — ED Provider Notes (Signed)
History     CSN: [id number] Arrival date & time: 11/07/2011  1:56 PM   First MD Initiated Contact with Patient 11/07/11 1418      Chief Complaint  Patient presents with  . Numbness    (Consider location/radiation/quality/duration/timing/severity/associated sxs/prior treatment) HPI The patient presents with 5 days of facial dysesthesia. She notes that her symptoms began insidiously, since onset has been persistent, not relieved by anything. She describes dysesthesia about the entire face, otherwise no notable complaints. She denies any concurrent confusion, nausea, fevers, chills, cough, visual changes, unilateral weakness she saw her physician in the interim, and had no medication changes. Past Medical History  Diagnosis Date  . Diabetes mellitus   . Hypertension   . GERD (gastroesophageal reflux disease)   . Herniated disc   . Arthritis     Past Surgical History  Procedure Date  . Shoulder surgery   . Cholecystectomy   . Knee surgery     right  . Neck surgery     History reviewed. No pertinent family history.  History  Substance Use Topics  . Smoking status: Current Everyday Smoker  . Smokeless tobacco: Not on file  . Alcohol Use: No    OB History    Grav Para Term Preterm Abortions TAB SAB Ect Mult Living                  Review of Systems  Constitutional:       HPI  HENT:       HPI otherwise negative  Eyes: Negative.   Respiratory:       HPI, otherwise negative  Cardiovascular:       HPI, otherwise nmegative  Gastrointestinal: Negative for vomiting.  Genitourinary:       HPI, otherwise negative  Musculoskeletal:       HPI, otherwise negative  Skin: Negative.   Neurological: Positive for numbness. Negative for dizziness, tremors, seizures, syncope, facial asymmetry, speech difficulty, weakness, light-headedness and headaches.    Allergies  Review of patient's allergies indicates no known allergies.  Home Medications   Current Outpatient Rx    Name Route Sig Dispense Refill  . ATORVASTATIN CALCIUM 20 MG PO TABS Oral Take 20 mg by mouth daily.      . CYCLOBENZAPRINE HCL 10 MG PO TABS Oral Take 10 mg by mouth 2 (two) times daily as needed. For muscle spasms.     Marland Kitchen ESOMEPRAZOLE MAGNESIUM 40 MG PO CPDR Oral Take 40 mg by mouth daily before breakfast.      . LOSARTAN POTASSIUM-HCTZ 100-12.5 MG PO TABS Oral Take 1 tablet by mouth daily.      Marland Kitchen METFORMIN HCL 500 MG PO TABS Oral Take 500 mg by mouth 2 (two) times daily with a meal.      . METOPROLOL TARTRATE 50 MG PO TABS Oral Take 50 mg by mouth 2 (two) times daily.      Marland Kitchen MONTELUKAST SODIUM 10 MG PO TABS Oral Take 10 mg by mouth at bedtime.      Marland Kitchen POTASSIUM CHLORIDE 10 MEQ PO TBCR Oral Take 20 mEq by mouth daily.        BP 140/79  Pulse 66  Temp(Src) 98.1 F (36.7 C) (Oral)  Resp 20  Ht 5\' 7"  (1.702 m)  Wt 220 lb (99.791 kg)  BMI 34.46 kg/m2  SpO2 100%  Physical Exam  Nursing note and vitals reviewed. Constitutional: She is oriented to person, place, and time. She appears well-developed and well-nourished. No  distress.  HENT:  Head: Normocephalic and atraumatic.  Nose: Nose normal.  Mouth/Throat: Oropharynx is clear and moist. No oropharyngeal exudate.  Eyes: Conjunctivae and EOM are normal. Right eye exhibits no discharge. Left eye exhibits no discharge. No scleral icterus.  Neck: Neck supple.  Cardiovascular: Normal rate and regular rhythm.   Pulmonary/Chest: Effort normal and breath sounds normal. No stridor.  Abdominal: Soft.  Musculoskeletal: She exhibits no edema and no tenderness.  Neurological: She is alert and oriented to person, place, and time. She has normal strength and normal reflexes. No cranial nerve deficit or sensory deficit. She exhibits normal muscle tone. She displays a negative Romberg sign. Coordination and gait normal.  Skin: Skin is warm and dry. She is not diaphoretic.  Psychiatric: She has a normal mood and affect.    ED Course  Procedures  (including critical care time)   Labs Reviewed  CBC  DIFFERENTIAL  COMPREHENSIVE METABOLIC PANEL  URINALYSIS, ROUTINE W REFLEX MICROSCOPIC   No results found.   No diagnosis found.    MDM  This 65 year old female with diabetes, hypertension presents with several days of facial dysesthesia. On exam the patient is in no distress with unremarkable vital signs, and no notable physical exam findings, including no focal neurologic deficits. The patient has not complained of nor does she demonstrate any unilateral deficiencies, nor any dyspnea, dysphasia, dysphonia. Absent any focal complaints beyond dysesthesia, and with no focal findings on exam, and with laboratory evaluation which is consistent with patient's prior labs, as well as with negative CT brain the patient was discharged.  A specific etiology was not found, and this was discussed with the patient, requiring additional physician evaluation and management as an outpatient, including consideration of possible advanced imaging or medication realignment.       Gerhard Munch, MD 11/08/11 [phone]

## 2011-11-07 NOTE — ED Notes (Signed)
Pt. Has developed rt. Facial numbness . Symptoms began 4 days ago. No other neuro symptoms noted. Denies any pain or discomfort.

## 2011-11-07 NOTE — ED Notes (Signed)
Patient transported to CT 

## 2011-11-07 NOTE — ED Notes (Signed)
Printed two old ekgs from [facility]. 

## 2011-11-10 ENCOUNTER — Inpatient Hospital Stay (HOSPITAL_COMMUNITY)
Admission: AD | Admit: 2011-11-10 | Discharge: 2011-11-11 | DRG: 093 | Disposition: A | Discharge status: Home / Self Care | Payer: Medicare Other | Source: Ambulatory Visit | Attending: Cardiovascular Disease | Admitting: Cardiovascular Disease

## 2011-11-10 ENCOUNTER — Encounter (HOSPITAL_COMMUNITY): Payer: Self-pay | Admitting: General Practice

## 2011-11-10 DIAGNOSIS — M5090 Cervical disc disorder, unspecified, unspecified cervical region: Secondary | ICD-10-CM | POA: Diagnosis present

## 2011-11-10 DIAGNOSIS — J0100 Acute maxillary sinusitis, unspecified: Secondary | ICD-10-CM | POA: Diagnosis present

## 2011-11-10 DIAGNOSIS — F411 Generalized anxiety disorder: Secondary | ICD-10-CM | POA: Diagnosis present

## 2011-11-10 DIAGNOSIS — R51 Headache: Secondary | ICD-10-CM | POA: Diagnosis present

## 2011-11-10 DIAGNOSIS — R202 Paresthesia of skin: Secondary | ICD-10-CM | POA: Diagnosis present

## 2011-11-10 DIAGNOSIS — R519 Headache, unspecified: Secondary | ICD-10-CM | POA: Diagnosis present

## 2011-11-10 DIAGNOSIS — I10 Essential (primary) hypertension: Secondary | ICD-10-CM | POA: Diagnosis present

## 2011-11-10 DIAGNOSIS — E669 Obesity, unspecified: Secondary | ICD-10-CM | POA: Diagnosis present

## 2011-11-10 DIAGNOSIS — E119 Type 2 diabetes mellitus without complications: Secondary | ICD-10-CM | POA: Diagnosis present

## 2011-11-10 DIAGNOSIS — R0602 Shortness of breath: Secondary | ICD-10-CM

## 2011-11-10 DIAGNOSIS — R200 Anesthesia of skin: Secondary | ICD-10-CM | POA: Diagnosis present

## 2011-11-10 DIAGNOSIS — R209 Unspecified disturbances of skin sensation: Principal | ICD-10-CM | POA: Diagnosis present

## 2011-11-10 HISTORY — DX: Dorsalgia, unspecified: M54.9

## 2011-11-10 HISTORY — DX: Shortness of breath: R06.02

## 2011-11-10 HISTORY — DX: Personal history of other diseases of the digestive system: Z87.19

## 2011-11-10 HISTORY — DX: Headache: R51

## 2011-11-10 HISTORY — DX: Other chronic pain: G89.29

## 2011-11-10 LAB — DIFFERENTIAL
Basophils Relative: 1 % (ref 0–1)
Lymphocytes Relative: 50 % — ABNORMAL HIGH (ref 12–46)
Lymphs Abs: 4.5 10*3/uL — ABNORMAL HIGH (ref 0.7–4.0)
Monocytes Relative: 6 % (ref 3–12)
Neutro Abs: 3.3 10*3/uL (ref 1.7–7.7)
Neutrophils Relative %: 36 % — ABNORMAL LOW (ref 43–77)

## 2011-11-10 LAB — CBC
HCT: 36 % (ref 36.0–46.0)
MCHC: 33.9 g/dL (ref 30.0–36.0)
MCV: 87.8 fL (ref 78.0–100.0)
Platelets: 369 10*3/uL (ref 150–400)
RDW: 12.9 % (ref 11.5–15.5)

## 2011-11-10 LAB — COMPREHENSIVE METABOLIC PANEL
Albumin: 3.4 g/dL — ABNORMAL LOW (ref 3.5–5.2)
BUN: 15 mg/dL (ref 6–23)
Calcium: 9.4 mg/dL (ref 8.4–10.5)
Creatinine, Ser: 1.09 mg/dL (ref 0.50–1.10)
Total Protein: 7.5 g/dL (ref 6.0–8.3)

## 2011-11-10 MED ORDER — ONDANSETRON HCL 4 MG PO TABS: 4.0000 mg | ORAL_TABLET | Freq: Four times a day (QID) | ORAL | Status: DC | PRN

## 2011-11-10 MED ORDER — LOSARTAN POTASSIUM 50 MG PO TABS
100.0000 mg | ORAL_TABLET | Freq: Every day | ORAL | Status: DC
  Administered 2011-11-11: 100 mg via ORAL
  Filled 2011-11-10: qty 2

## 2011-11-10 MED ORDER — SODIUM CHLORIDE 0.9 % IV SOLN: 1.0000 mg/h | Freq: Once | INTRAVENOUS | Status: DC

## 2011-11-10 MED ORDER — SODIUM CHLORIDE 0.9 % IJ SOLN
3.0000 mL | Freq: Two times a day (BID) | INTRAMUSCULAR | Status: DC
  Administered 2011-11-10 – 2011-11-11 (×2): 3 mL via INTRAVENOUS

## 2011-11-10 MED ORDER — POTASSIUM CHLORIDE CRYS ER 10 MEQ PO TBCR
10.0000 meq | EXTENDED_RELEASE_TABLET | Freq: Every day | ORAL | Status: DC
  Administered 2011-11-11: 10 meq via ORAL
  Filled 2011-11-10: qty 1

## 2011-11-10 MED ORDER — POTASSIUM CHLORIDE CRYS ER 20 MEQ PO TBCR
40.0000 meq | EXTENDED_RELEASE_TABLET | Freq: Once | ORAL | Status: AC
  Administered 2011-11-10: 40 meq via ORAL
  Filled 2011-11-10: qty 2

## 2011-11-10 MED ORDER — ASPIRIN EC 81 MG PO TBEC
81.0000 mg | DELAYED_RELEASE_TABLET | Freq: Every day | ORAL | Status: DC
  Administered 2011-11-10 – 2011-11-11 (×2): 81 mg via ORAL
  Filled 2011-11-10 (×2): qty 1

## 2011-11-10 MED ORDER — ROSUVASTATIN CALCIUM 20 MG PO TABS
20.0000 mg | ORAL_TABLET | Freq: Every day | ORAL | Status: DC
  Administered 2011-11-10 – 2011-11-11 (×2): 20 mg via ORAL
  Filled 2011-11-10 (×2): qty 1

## 2011-11-10 MED ORDER — HYDROCHLOROTHIAZIDE 12.5 MG PO CAPS
12.5000 mg | ORAL_CAPSULE | Freq: Every day | ORAL | Status: DC
  Administered 2011-11-11: 12.5 mg via ORAL
  Filled 2011-11-10: qty 1

## 2011-11-10 MED ORDER — CYCLOBENZAPRINE HCL 10 MG PO TABS: 10.0000 mg | ORAL_TABLET | Freq: Two times a day (BID) | ORAL | Status: DC | PRN

## 2011-11-10 MED ORDER — ZOLPIDEM TARTRATE 5 MG PO TABS: 5.0000 mg | ORAL_TABLET | Freq: Every evening | ORAL | Status: DC | PRN

## 2011-11-10 MED ORDER — SODIUM CHLORIDE 0.9 % IV SOLN: 250.0000 mL | INTRAVENOUS | Status: DC | PRN

## 2011-11-10 MED ORDER — ADULT MULTIVITAMIN W/MINERALS CH
1.0000 | ORAL_TABLET | Freq: Every day | ORAL | Status: DC
  Administered 2011-11-10 – 2011-11-11 (×2): 1 via ORAL
  Filled 2011-11-10 (×2): qty 1

## 2011-11-10 MED ORDER — HYDROCODONE-ACETAMINOPHEN 5-325 MG PO TABS
1.0000 | ORAL_TABLET | ORAL | Status: DC | PRN
  Administered 2011-11-10: 2 via ORAL
  Filled 2011-11-10: qty 2

## 2011-11-10 MED ORDER — SODIUM CHLORIDE 0.9 % IJ SOLN: 3.0000 mL | INTRAMUSCULAR | Status: DC | PRN

## 2011-11-10 MED ORDER — DOCUSATE SODIUM 100 MG PO CAPS
100.0000 mg | ORAL_CAPSULE | Freq: Two times a day (BID) | ORAL | Status: DC
  Administered 2011-11-10 – 2011-11-11 (×2): 100 mg via ORAL
  Filled 2011-11-10 (×2): qty 1

## 2011-11-10 MED ORDER — DIPHENHYDRAMINE HCL 25 MG PO CAPS
25.0000 mg | ORAL_CAPSULE | ORAL | Status: DC | PRN
  Administered 2011-11-11: 25 mg via ORAL
  Filled 2011-11-10: qty 1

## 2011-11-10 MED ORDER — HYDROMORPHONE HCL PF 1 MG/ML IJ SOLN
INTRAMUSCULAR | Status: AC
  Administered 2011-11-10: 1 mg
  Filled 2011-11-10: qty 1

## 2011-11-10 MED ORDER — ALUM & MAG HYDROXIDE-SIMETH 200-200-20 MG/5ML PO SUSP: 30.0000 mL | Freq: Four times a day (QID) | ORAL | Status: DC | PRN

## 2011-11-10 MED ORDER — PANTOPRAZOLE SODIUM 40 MG PO TBEC
40.0000 mg | DELAYED_RELEASE_TABLET | Freq: Every day | ORAL | Status: DC
  Administered 2011-11-10 – 2011-11-11 (×2): 40 mg via ORAL
  Filled 2011-11-10 (×2): qty 1

## 2011-11-10 MED ORDER — METFORMIN HCL 500 MG PO TABS
500.0000 mg | ORAL_TABLET | Freq: Two times a day (BID) | ORAL | Status: DC
  Administered 2011-11-10 – 2011-11-11 (×2): 500 mg via ORAL
  Filled 2011-11-10 (×3): qty 1

## 2011-11-10 MED ORDER — ONDANSETRON HCL 4 MG/2ML IJ SOLN: 4.0000 mg | Freq: Four times a day (QID) | INTRAMUSCULAR | Status: DC | PRN

## 2011-11-10 MED ORDER — ENOXAPARIN SODIUM 40 MG/0.4ML ~~LOC~~ SOLN
40.0000 mg | SUBCUTANEOUS | Status: DC
  Administered 2011-11-10: 40 mg via SUBCUTANEOUS
  Filled 2011-11-10 (×2): qty 0.4

## 2011-11-10 NOTE — H&P (Signed)
[name] is an 65 y.o. female.   Chief Complaint: Right sided numbness including facial numbness.  HPI: 65 years old black female with 3 day history of right sided tingling and heaviness. Today she noticed facial numbness. No fever, no loss of consciousness. No speech or vision problem.  Past Medical History  Diagnosis Date  . Diabetes mellitus   . Hypertension   . GERD (gastroesophageal reflux disease)   . Herniated disc   . Arthritis   . Shortness of breath on exertion 11/10/11    "sometimes"  . H/O hiatal hernia   . Headache   . Hemorrhoids   . Chronic back pain greater than 3 months duration       Past Surgical History  Procedure Date  . Knee surgery     right  . Shoulder open rotator cuff repair 07/20/2011    left  . Fracture surgery 2004?    right shoulder  . Knee arthroscopy 2004    right  . Tonsillectomy   . Cholecystectomy ~ 2008  . Abdominal hysterectomy 1997?    "partial"    History reviewed. No pertinent family history. Social History:  reports that she has been smoking Cigarettes.  She has a 10 pack-year smoking history. She has never used smokeless tobacco. She reports that she does not drink alcohol or use illicit drugs.  Allergies: No Known Allergies  Medications Prior to Admission  Medication Dose Route Frequency Provider Last Rate Last Dose  . 0.9 %  sodium chloride infusion  250 mL Intravenous PRN [name], MD      . alum & mag hydroxide-simeth (MAALOX/MYLANTA) 200-200-20 MG/5ML suspension 30 mL  30 mL Oral Q6H PRN [name], MD      . aspirin EC tablet 81 mg  81 mg Oral Daily [name], MD      . cyclobenzaprine (FLEXERIL) tablet 10 mg  10 mg Oral BID PRN [name], MD      . docusate sodium (COLACE) capsule 100 mg  100 mg Oral BID [name], MD      . enoxaparin (LOVENOX) injection 40 mg  40 mg Subcutaneous Q24H [name], MD      . hydrochlorothiazide (MICROZIDE) capsule 12.5 mg  12.5 mg Oral Daily [name],  MD      . HYDROcodone-acetaminophen (NORCO) 5-325 MG per tablet 1-2 tablet  1-2 tablet Oral Q4H PRN [name], MD   2 tablet at 11/10/11 2024  . losartan (COZAAR) tablet 100 mg  100 mg Oral Daily [name], MD      . metFORMIN (GLUCOPHAGE) tablet 500 mg  500 mg Oral BID [name], MD      . mulitivitamin with minerals tablet 1 tablet  1 tablet Oral Daily [name], MD      . ondansetron (ZOFRAN) tablet 4 mg  4 mg Oral Q6H PRN [name], MD       Or  . ondansetron (ZOFRAN) injection 4 mg  4 mg Intravenous Q6H PRN [name], MD      . pantoprazole (PROTONIX) EC tablet 40 mg  40 mg Oral Q1200 [name], MD      . potassium chloride (K-DUR,KLOR-CON) CR tablet 10 mEq  10 mEq Oral Daily [name], MD      . rosuvastatin (CRESTOR) tablet 20 mg  20 mg Oral Daily [name], MD      .  sodium chloride 0.9 % injection 3 mL  3 mL Intravenous Q12H [name], MD      . sodium chloride 0.9 % injection 3 mL  3 mL Intravenous PRN [name], MD      . zolpidem (AMBIEN) tablet 5 mg  5 mg Oral QHS PRN [name], MD       Medications Prior to Admission  Medication Sig Dispense Refill  . atorvastatin (LIPITOR) 20 MG tablet Take 20 mg by mouth daily.        . cyclobenzaprine (FLEXERIL) 10 MG tablet Take 10 mg by mouth 2 (two) times daily as needed. For muscle spasms.       Marland Kitchen esomeprazole (NEXIUM) 40 MG capsule Take 40 mg by mouth daily before breakfast.        . losartan-hydrochlorothiazide (HYZAAR) 100-12.5 MG per tablet Take 1 tablet by mouth daily.        . metFORMIN (GLUCOPHAGE) 500 MG tablet Take 500 mg by mouth 2 (two) times daily with a meal.        . metoprolol (LOPRESSOR) 50 MG tablet Take 50 mg by mouth 2 (two) times daily.        . montelukast (SINGULAIR) 10 MG tablet Take 10 mg by mouth at bedtime.        . potassium chloride (KLOR-CON) 10 MEQ CR tablet Take 20 mEq by mouth daily.          Results for orders placed during the hospital  encounter of 11/10/11 (from the past 48 hour(s))  CBC     Status: Normal   Collection Time   11/10/11  8:18 PM      Component Value Range Comment   WBC 8.9  4.0 - 10.5 (K/uL)    RBC 4.10  3.87 - 5.11 (MIL/uL)    Hemoglobin 12.2  12.0 - 15.0 (g/dL)    HCT 04.5  40.9 - 81.1 (%)    MCV 87.8  78.0 - 100.0 (fL)    MCH 29.8  26.0 - 34.0 (pg)    MCHC 33.9  30.0 - 36.0 (g/dL)    RDW 91.4  78.2 - 95.6 (%)    Platelets 369  150 - 400 (K/uL)   COMPREHENSIVE METABOLIC PANEL     Status: Abnormal   Collection Time   11/10/11  8:18 PM      Component Value Range Comment   Sodium 135  135 - 145 (mEq/L)    Potassium 3.0 (*) 3.5 - 5.1 (mEq/L)    Chloride 98  96 - 112 (mEq/L)    CO2 28  19 - 32 (mEq/L)    Glucose, Bld 143 (*) 70 - 99 (mg/dL)    BUN 15  6 - 23 (mg/dL)    Creatinine, Ser 2.13  0.50 - 1.10 (mg/dL)    Calcium 9.4  8.4 - 10.5 (mg/dL)    Total Protein 7.5  6.0 - 8.3 (g/dL)    Albumin 3.4 (*) 3.5 - 5.2 (g/dL)    AST 15  0 - 37 (U/L)    ALT 11  0 - 35 (U/L)    Alkaline Phosphatase 139 (*) 39 - 117 (U/L)    Total Bilirubin 0.2 (*) 0.3 - 1.2 (mg/dL)    GFR calc non Af Amer 52 (*) >90 (mL/min)    GFR calc Af Amer 61 (*) >90 (mL/min)   DIFFERENTIAL     Status: Abnormal   Collection Time   11/10/11  8:18 PM  Component Value Range Comment   Neutrophils Relative 36 (*) 43 - 77 (%)    Neutro Abs 3.3  1.7 - 7.7 (K/uL)    Lymphocytes Relative 50 (*) 12 - 46 (%)    Lymphs Abs 4.5 (*) 0.7 - 4.0 (K/uL)    Monocytes Relative 6  3 - 12 (%)    Monocytes Absolute 0.6  0.1 - 1.0 (K/uL)    Eosinophils Relative 6 (*) 0 - 5 (%)    Eosinophils Absolute 0.6  0.0 - 0.7 (K/uL)    Basophils Relative 1  0 - 1 (%)    Basophils Absolute 0.1  0.0 - 0.1 (K/uL)    No results found.  @ROS @  Blood pressure 124/59, pulse 63, temperature 98.4 F (36.9 C), temperature source Oral, resp. rate 21, SpO2 100.00%. General appearance: alert, cooperative and appears stated age Head: Normocephalic, without  obvious abnormality, atraumatic Eyes: Brown eyes, conjunctivae/corneas clear. PERRL, EOM's intact.  Neck: no adenopathy, no carotid bruit, no JVD, supple, symmetrical, trachea midline and thyroid not enlarged, scar of surgery. Resp: clear to auscultation bilaterally Cardio: regular rate and rhythm, S1, S2 normal, II / VI systolic murmur,  GI: soft, non-tender; bowel sounds normal; no masses,  no organomegaly. Scar of GB surgery. Extremities: extremities normal, atraumatic, no cyanosis or edema. + clubbing. Skin: Skin color, texture, turgor normal. No rashes or lesions Neurologic: Alert and oriented X 3, normal strength and tone. Normal coordination and gait  Assessment Right sided numbness Facial numbness Anxiety Cervical disc disease DM, II Hypertension Obesity  Plan MRI/MRA brain Home medications  [place] Acklin S 11/10/2011, 9:26 PM

## 2011-11-11 ENCOUNTER — Inpatient Hospital Stay (HOSPITAL_COMMUNITY): Payer: Medicare Other

## 2011-11-11 LAB — BASIC METABOLIC PANEL
BUN: 16 mg/dL (ref 6–23)
CO2: 29 mEq/L (ref 19–32)
Chloride: 101 mEq/L (ref 96–112)
GFR calc Af Amer: 55 mL/min — ABNORMAL LOW (ref >90)
Glucose, Bld: 107 mg/dL — ABNORMAL HIGH (ref 70–99)
Potassium: 3.4 mEq/L — ABNORMAL LOW (ref 3.5–5.1)

## 2011-11-11 LAB — GLUCOSE, CAPILLARY: Glucose-Capillary: 120 mg/dL — ABNORMAL HIGH (ref 70–99)

## 2011-11-11 MED ORDER — HYDROMORPHONE HCL PF 1 MG/ML IJ SOLN
1.0000 mg | Freq: Once | INTRAMUSCULAR | Status: AC
  Administered 2011-11-11: 1 mg via INTRAVENOUS

## 2011-11-11 MED ORDER — LORATADINE-PSEUDOEPHEDRINE ER 5-120 MG PO TB12: 1.0000 | ORAL_TABLET | Freq: Two times a day (BID) | ORAL | Status: DC

## 2011-11-11 MED ORDER — HYDROMORPHONE HCL PF 1 MG/ML IJ SOLN
INTRAMUSCULAR | Status: AC
  Filled 2011-11-11: qty 1

## 2011-11-11 MED ORDER — AZITHROMYCIN 250 MG PO TABS: ORAL_TABLET | ORAL | Status: AC

## 2011-11-11 MED ORDER — POTASSIUM CHLORIDE 10 MEQ PO TBCR: 20.0000 meq | EXTENDED_RELEASE_TABLET | Freq: Every day | ORAL | Status: DC

## 2011-11-11 NOTE — Discharge Summary (Signed)
Physician Discharge Summary  Patient ID: [name] MRN: [id number] DOB/AGE: 64-Aug-1947 64 y.o.  Admit date: 11/10/2011 Discharge date: 11/11/2011  Admission Diagnoses: Principal Problem:  *Numbness and tingling in right hand Active Problems:  Hypertension  Cervical disc disease  Headache  DM II (diabetes mellitus, type II), controlled  Discharge Diagnoses:  Principal Problem:  *Numbness and tingling in right hand Active Problems:  Hypertension  Cervical disc disease  Headache  DM II (diabetes mellitus, type II), controlled Acute maxillary Sinusitis  Discharged Condition: good  Hospital Course: 65 years old black female was placed in observation for right sided numbness and facial twitching. MRI of brain showed acute sinusitis and no stroke or bleed.  Consults: none  Significant Diagnostic Studies: radiology: MRI: Brain- neg for stroke  Treatments: analgesia: Dilaudid  Discharge Exam: Blood pressure 115/61, pulse 63, temperature 97.9 F (36.6 C), temperature source Oral, resp. rate 18, weight 99.791 kg (220 lb), SpO2 97.00%. Head: Normocephalic, without obvious abnormality, atraumatic  Eyes: Brown eyes, conjunctivae/corneas clear. PERRL, EOM's intact.  Sinuses; Mild right maxillary tenderness. Neck: no adenopathy, no carotid bruit, no JVD, supple, symmetrical, trachea midline and thyroid not enlarged, scar of surgery.  Resp: clear to auscultation bilaterally  Cardio: regular rate and rhythm, S1, S2 normal, II / VI systolic murmur,  GI: soft, non-tender; bowel sounds normal; no masses, no organomegaly. Scar of GB surgery.  Extremities: extremities normal, atraumatic, no cyanosis or edema. + clubbing.  Skin: Skin color, texture, turgor normal. No rashes or lesions  Neurologic: Alert and oriented X 3, normal strength and tone. Normal coordination and gait    Disposition: Home or Self Care   Medication List  As of 11/11/2011  1:56 PM   START taking these  medications         azithromycin 250 MG tablet   Commonly known as: ZITHROMAX   Two tablets day one, the one daily      loratadine-pseudoephedrine 5-120 MG per tablet   Commonly known as: CLARITIN-D 12-hour   Take 1 tablet by mouth 2 (two) times daily.         CONTINUE taking these medications         atorvastatin 20 MG tablet   Commonly known as: LIPITOR      cyclobenzaprine 10 MG tablet   Commonly known as: FLEXERIL      esomeprazole 40 MG capsule   Commonly known as: NEXIUM      losartan-hydrochlorothiazide 100-12.5 MG per tablet   Commonly known as: HYZAAR      metFORMIN 500 MG tablet   Commonly known as: GLUCOPHAGE      metoprolol 50 MG tablet   Commonly known as: LOPRESSOR      montelukast 10 MG tablet   Commonly known as: SINGULAIR      potassium chloride 10 MEQ CR tablet   Commonly known as: KLOR-CON   Take 2 tablets (20 mEq total) by mouth daily.          Where to get your medications    These are the prescriptions that you need to pick up.   You [name] get these medications from any pharmacy.         azithromycin 250 MG tablet   loratadine-pseudoephedrine 5-120 MG per tablet   potassium chloride 10 MEQ CR tablet           Follow-up Information    Follow up with [facility] S, MD in 1 month.   Contact information:   [address] [place] [address] [phone]          Signed: [name] 11/11/2011, 1:56 PM

## 2011-11-11 NOTE — Progress Notes (Signed)
Discharged home,home discharge instruction given, no question verbalized. Alert and oriented,not in any distress, denies any pain.

## 2012-02-21 ENCOUNTER — Encounter (HOSPITAL_COMMUNITY): Payer: Self-pay

## 2012-02-21 ENCOUNTER — Emergency Department (HOSPITAL_COMMUNITY)
Admission: EM | Admit: 2012-02-21 | Discharge: 2012-02-21 | Disposition: A | Discharge status: Home / Self Care | Payer: Medicare Other | Attending: Emergency Medicine | Admitting: Emergency Medicine

## 2012-02-21 DIAGNOSIS — K219 Gastro-esophageal reflux disease without esophagitis: Secondary | ICD-10-CM | POA: Insufficient documentation

## 2012-02-21 DIAGNOSIS — X58XXXA Exposure to other specified factors, initial encounter: Secondary | ICD-10-CM | POA: Insufficient documentation

## 2012-02-21 DIAGNOSIS — I10 Essential (primary) hypertension: Secondary | ICD-10-CM | POA: Insufficient documentation

## 2012-02-21 DIAGNOSIS — E119 Type 2 diabetes mellitus without complications: Secondary | ICD-10-CM | POA: Insufficient documentation

## 2012-02-21 DIAGNOSIS — S335XXA Sprain of ligaments of lumbar spine, initial encounter: Secondary | ICD-10-CM | POA: Insufficient documentation

## 2012-02-21 DIAGNOSIS — IMO0002 Reserved for concepts with insufficient information to code with codable children: Secondary | ICD-10-CM

## 2012-02-21 LAB — URINALYSIS, ROUTINE W REFLEX MICROSCOPIC
Glucose, UA: NEGATIVE mg/dL
Ketones, ur: NEGATIVE mg/dL
Leukocytes, UA: NEGATIVE
Nitrite: NEGATIVE
Protein, ur: 30 mg/dL — AB
Specific Gravity, Urine: 1.021 (ref 1.005–1.030)
Urobilinogen, UA: 1 mg/dL (ref 0.0–1.0)
pH: 6 (ref 5.0–8.0)

## 2012-02-21 LAB — POCT I-STAT, CHEM 8
Chloride: 103 mEq/L (ref 96–112)
Creatinine, Ser: 1.2 mg/dL — ABNORMAL HIGH (ref 0.50–1.10)
Glucose, Bld: 114 mg/dL — ABNORMAL HIGH (ref 70–99)
HCT: 43 % (ref 36.0–46.0)
Potassium: 3.7 mEq/L (ref 3.5–5.1)

## 2012-02-21 LAB — CBC
HCT: 39 % (ref 36.0–46.0)
Hemoglobin: 13.1 g/dL (ref 12.0–15.0)
MCH: 29.3 pg (ref 26.0–34.0)
MCHC: 33.6 g/dL (ref 30.0–36.0)
MCV: 87.2 fL (ref 78.0–100.0)
Platelets: 348 10*3/uL (ref 150–400)
RBC: 4.47 MIL/uL (ref 3.87–5.11)
RDW: 14 % (ref 11.5–15.5)
WBC: 8.1 K/uL (ref 4.0–10.5)

## 2012-02-21 LAB — URINE MICROSCOPIC-ADD ON

## 2012-02-21 MED ORDER — FENTANYL CITRATE 0.05 MG/ML IJ SOLN
100.0000 ug | Freq: Once | INTRAMUSCULAR | Status: AC
  Administered 2012-02-21: 100 ug via INTRAVENOUS
  Filled 2012-02-21: qty 2

## 2012-02-21 MED ORDER — OXYCODONE-ACETAMINOPHEN 5-325 MG PO TABS: ORAL_TABLET | ORAL | Status: DC

## 2012-02-21 MED ORDER — KETOROLAC TROMETHAMINE 30 MG/ML IJ SOLN
INTRAMUSCULAR | Status: AC
  Administered 2012-02-21: 13:00:00
  Filled 2012-02-21: qty 1

## 2012-02-21 MED ORDER — KETOROLAC TROMETHAMINE 15 MG/ML IJ SOLN
15.0000 mg | INTRAMUSCULAR | Status: DC
  Filled 2012-02-21: qty 1

## 2012-02-21 MED ORDER — IBUPROFEN 400 MG PO TABS: 400.0000 mg | ORAL_TABLET | Freq: Three times a day (TID) | ORAL | Status: AC

## 2012-02-21 MED ORDER — DIAZEPAM 5 MG PO TABS
5.0000 mg | ORAL_TABLET | Freq: Once | ORAL | Status: AC
  Administered 2012-02-21: 5 mg via ORAL
  Filled 2012-02-21: qty 1

## 2012-02-21 NOTE — ED Notes (Signed)
Pt states that this morning at 0300 she had onset of left lower flank pain that woke her up. She denies any hematuria. No n/v/d. No meds pta.

## 2012-02-21 NOTE — ED Provider Notes (Signed)
History     CSN: [id number]  Arrival date & time 02/21/12  1120   First MD Initiated Contact with Patient 02/21/12 1239      Chief Complaint  Patient presents with  . Flank Pain    (Consider location/radiation/quality/duration/timing/severity/associated sxs/prior treatment) HPI Comments: Patient reports that around 4:00 this morning she was woken up with left-sided low back pain that radiated little bit into her left thigh region. She reports that she has had kidney stones in the past and this does not feel like kidney stone pain. She denies nausea or vomiting. She has had an increase in urinary frequency but denies dysuria or hematuria. She does have a history of diabetes type 2 and takes oral medications. She denies recent fall or injury. She does have a history of chronic back pain. She reports, move her poor so, bear weight, stand, walk all causes her back pain to get worse. She denies specifically numbness or distal weakness. No difficulty urinating.  No fevers or chills.  Patient is a 66 y.o. female presenting with flank pain. The history is provided by the patient.  Flank Pain Pertinent negatives include no abdominal pain.    Past Medical History  Diagnosis Date  . Diabetes mellitus   . Hypertension   . GERD (gastroesophageal reflux disease)   . Herniated disc   . Arthritis   . Shortness of breath on exertion 11/10/11    "sometimes"  . H/O hiatal hernia   . Headache   . Hemorrhoids   . Chronic back pain greater than 3 months duration     Past Surgical History  Procedure Date  . Knee surgery     right  . Shoulder open rotator cuff repair 07/20/2011    left  . Fracture surgery 2004?    right shoulder  . Knee arthroscopy 2004    right  . Tonsillectomy   . Cholecystectomy ~ 2008  . Abdominal hysterectomy 1997?    "partial"    History reviewed. No pertinent family history.  History  Substance Use Topics  . Smoking status: Current Everyday Smoker -- 0.2  packs/day for 40 years    Types: Cigarettes  . Smokeless tobacco: Never Used   Comment: smoking cessation consult entered  . Alcohol Use: No    OB History    Grav Para Term Preterm Abortions TAB SAB Ect Mult Living                  Review of Systems  Constitutional: Negative.  Negative for fever and chills.  Gastrointestinal: Negative for nausea, vomiting, abdominal pain and diarrhea.  Genitourinary: Positive for frequency. Negative for dysuria, hematuria and decreased urine volume.  Skin: Negative for rash and wound.  Neurological: Negative for weakness and numbness.    Allergies  Hydrocodone  Home Medications   Current Outpatient Rx  Name Route Sig Dispense Refill  . ATORVASTATIN CALCIUM 20 MG PO TABS Oral Take 20 mg by mouth daily.      . CYCLOBENZAPRINE HCL 10 MG PO TABS Oral Take 10 mg by mouth 2 (two) times daily as needed. For muscle spasms.     Marland Kitchen ESOMEPRAZOLE MAGNESIUM 40 MG PO CPDR Oral Take 40 mg by mouth daily before breakfast.      . LORATADINE-PSEUDOEPHEDRINE ER 5-120 MG PO TB12 Oral Take 1 tablet by mouth 2 (two) times daily.    Marland Kitchen LOSARTAN POTASSIUM-HCTZ 100-12.5 MG PO TABS Oral Take 1 tablet by mouth daily.      Marland Kitchen  METFORMIN HCL 500 MG PO TABS Oral Take 500 mg by mouth 2 (two) times daily with a meal.      . POTASSIUM CHLORIDE 10 MEQ PO TBCR Oral Take 20 mEq by mouth daily.    . IBUPROFEN 400 MG PO TABS Oral Take 1 tablet (400 mg total) by mouth every 8 (eight) hours. Taken with food 21 tablet 0  . OXYCODONE-ACETAMINOPHEN 5-325 MG PO TABS  1-2 tablets po q 6 hours prn moderate to severe pain 20 tablet 0    BP 140/73  Pulse 66  Temp(Src) 98.3 F (36.8 C) (Oral)  Resp 20  SpO2 98%  Physical Exam  Nursing note and vitals reviewed. Constitutional: She is oriented to person, place, and time. She appears well-developed and well-nourished.  HENT:  Head: Normocephalic and atraumatic.  Abdominal: Soft. She exhibits no distension. There is no tenderness. There  is no rebound and no guarding.  Musculoskeletal:       Lumbar back: She exhibits tenderness, pain and spasm. She exhibits no swelling, no deformity and normal pulse.       Back:       + leg raise on left leg  Neurological: She is alert and oriented to person, place, and time. She has normal strength. She displays normal reflexes. No sensory deficit.  Skin: Skin is warm and dry. No rash noted.    ED Course  Procedures (including critical care time)  Labs Reviewed  URINALYSIS, ROUTINE W REFLEX MICROSCOPIC - Abnormal; Notable for the following:    Hgb urine dipstick MODERATE (*)    Bilirubin Urine SMALL (*)    Protein, ur 30 (*)    All other components within normal limits  URINE MICROSCOPIC-ADD ON - Abnormal; Notable for the following:    Squamous Epithelial / LPF FEW (*)    All other components within normal limits  POCT I-STAT, CHEM 8 - Abnormal; Notable for the following:    Creatinine, Ser 1.20 (*)    Glucose, Bld 114 (*)    All other components within normal limits  CBC   No results found.   1. Lumbar sprain and strain     1:32 PM Pt feels improved.  She reports feeling slightly SOB after IV meds, possibly fentanyl, however no rash, no stridor, no wheezing, RA sats remain 100%.  Pt reports she has muscle relaxants at home.  IF she is able to ambulate, will d/c home.  She can follow up with her own PCP or back specialist next week.    MDM  Exam is consistent with lumbar spasm and strain and she has a positive straight leg lift test. Pain is actually worse when I tried to lower her leg causing severe spasms of her lumbar back on the left. Patient is given low dose of Toradol, fentanyl and Valium for her pain. Patient's urinalysis shows no evidence of urinary tract infection. There are 3-6 red blood cells but still within normal limits. I do not think this is ureteral colic. She has no fever and vital signs are otherwise normal.       Gavin Pound. Nikeisha Klutz, MD 02/21/12 1341

## 2012-02-21 NOTE — Discharge Instructions (Signed)
Back Pain, Adult Low back pain is very common. About 1 in 5 people have back pain.The cause of low back pain is rarely dangerous. The pain often gets better over time.About half of people with a sudden onset of back pain feel better in just 2 weeks. About 8 in 10 people feel better by 6 weeks.  CAUSES Some common causes of back pain include:  Strain of the muscles or ligaments supporting the spine.   Wear and tear (degeneration) of the spinal discs.   Arthritis.   Direct injury to the back.  DIAGNOSIS Most of the time, the direct cause of low back pain is not known.However, back pain can be treated effectively even when the exact cause of the pain is unknown.Answering your caregiver's questions about your overall health and symptoms is one of the most accurate ways to make sure the cause of your pain is not dangerous. If your caregiver needs more information, he or she Consuello order lab work or imaging tests (X-rays or MRIs).However, even if imaging tests show changes in your back, this usually does not require surgery. HOME CARE INSTRUCTIONS For many people, back pain returns.Since low back pain is rarely dangerous, it is often a condition that people can learn to manageon their own.   Remain active. It is stressful on the back to sit or stand in one place. Do not sit, drive, or stand in one place for more than 30 minutes at a time. Take short walks on level surfaces as soon as pain allows.Try to increase the length of time you walk each day.   Do not stay in bed.Resting more than 1 or 2 days can delay your recovery.   Do not avoid exercise or work.Your body is made to move.It is not dangerous to be active, even though your back Jailyn hurt.Your back will likely heal faster if you return to being active before your pain is gone.   Pay attention to your body when you bend and lift. Many people have less discomfortwhen lifting if they bend their knees, keep the load close to their  bodies,and avoid twisting. Often, the most comfortable positions are those that put less stress on your recovering back.   Find a comfortable position to sleep. Use a firm mattress and lie on your side with your knees slightly bent. If you lie on your back, put a pillow under your knees.   Only take over-the-counter or prescription medicines as directed by your caregiver. Over-the-counter medicines to reduce pain and inflammation are often the most helpful.Your caregiver Yamilka prescribe muscle relaxant drugs.These medicines help dull your pain so you can more quickly return to your normal activities and healthy exercise.   Put ice on the injured area.   Put ice in a plastic bag.   Place a towel between your skin and the bag.   Leave the ice on for 15 to 20 minutes, 3 to 4 times a day for the first 2 to 3 days. After that, ice and heat Peightyn be alternated to reduce pain and spasms.   Ask your caregiver about trying back exercises and gentle massage. This Braeden be of some benefit.   Avoid feeling anxious or stressed.Stress increases muscle tension and can worsen back pain.It is important to recognize when you are anxious or stressed and learn ways to manage it.Exercise is a great option.  SEEK MEDICAL CARE IF:  You have pain that is not relieved with rest or medicine.   You have   pain that does not improve in 1 week.   You have new symptoms.   You are generally not feeling well.  SEEK IMMEDIATE MEDICAL CARE IF:   You have pain that radiates from your back into your legs.   You develop new bowel or bladder control problems.   You have unusual weakness or numbness in your arms or legs.   You develop nausea or vomiting.   You develop abdominal pain.   You feel faint.  Document Released: 11/09/2005 Document Revised: 10/29/2011 Document Reviewed: 03/30/2011 Exit[employer] Patient Information 2012 [employer].   Narcotic and benzodiazepine use Shanena cause drowsiness, slowed breathing or  dependence.  Please use with caution and do not drive, operate machinery or watch young children alone while taking them.  Taking combinations of these medications or drinking alcohol will potentiate these effects.    

## 2012-04-07 ENCOUNTER — Encounter (HOSPITAL_COMMUNITY): Payer: Self-pay | Admitting: *Deleted

## 2012-04-07 ENCOUNTER — Emergency Department (HOSPITAL_COMMUNITY): Payer: Medicare Other

## 2012-04-07 ENCOUNTER — Emergency Department (HOSPITAL_COMMUNITY)
Admission: EM | Admit: 2012-04-07 | Discharge: 2012-04-08 | Disposition: A | Discharge status: Home / Self Care | Payer: Medicare Other | Attending: Emergency Medicine | Admitting: Emergency Medicine

## 2012-04-07 DIAGNOSIS — Z79899 Other long term (current) drug therapy: Secondary | ICD-10-CM | POA: Insufficient documentation

## 2012-04-07 DIAGNOSIS — H9209 Otalgia, unspecified ear: Secondary | ICD-10-CM | POA: Insufficient documentation

## 2012-04-07 DIAGNOSIS — R51 Headache: Secondary | ICD-10-CM | POA: Insufficient documentation

## 2012-04-07 DIAGNOSIS — E119 Type 2 diabetes mellitus without complications: Secondary | ICD-10-CM | POA: Insufficient documentation

## 2012-04-07 DIAGNOSIS — R42 Dizziness and giddiness: Secondary | ICD-10-CM

## 2012-04-07 DIAGNOSIS — M129 Arthropathy, unspecified: Secondary | ICD-10-CM | POA: Insufficient documentation

## 2012-04-07 DIAGNOSIS — I10 Essential (primary) hypertension: Secondary | ICD-10-CM | POA: Insufficient documentation

## 2012-04-07 DIAGNOSIS — F17200 Nicotine dependence, unspecified, uncomplicated: Secondary | ICD-10-CM | POA: Insufficient documentation

## 2012-04-07 DIAGNOSIS — K219 Gastro-esophageal reflux disease without esophagitis: Secondary | ICD-10-CM | POA: Insufficient documentation

## 2012-04-07 LAB — CBC
Hemoglobin: 12.9 g/dL (ref 12.0–15.0)
MCH: 29.1 pg (ref 26.0–34.0)
MCV: 86.9 fL (ref 78.0–100.0)
RBC: 4.43 MIL/uL (ref 3.87–5.11)

## 2012-04-07 LAB — DIFFERENTIAL
Eosinophils Absolute: 0.5 10*3/uL (ref 0.0–0.7)
Lymphocytes Relative: 46 % (ref 12–46)
Lymphs Abs: 4.7 10*3/uL — ABNORMAL HIGH (ref 0.7–4.0)
Monocytes Relative: 6 % (ref 3–12)
Neutrophils Relative %: 43 % (ref 43–77)

## 2012-04-07 LAB — BASIC METABOLIC PANEL
BUN: 19 mg/dL (ref 6–23)
CO2: 27 mEq/L (ref 19–32)
GFR calc non Af Amer: 50 mL/min — ABNORMAL LOW (ref >90)
Glucose, Bld: 102 mg/dL — ABNORMAL HIGH (ref 70–99)
Potassium: 3.9 mEq/L (ref 3.5–5.1)

## 2012-04-07 MED ORDER — LORAZEPAM 1 MG PO TABS: 1.0000 mg | ORAL_TABLET | Freq: Three times a day (TID) | ORAL | Status: AC | PRN

## 2012-04-07 MED ORDER — LORAZEPAM 2 MG/ML IJ SOLN
1.0000 mg | Freq: Once | INTRAMUSCULAR | Status: AC
  Administered 2012-04-07: 1 mg via INTRAVENOUS
  Filled 2012-04-07: qty 1

## 2012-04-07 MED ORDER — METHOCARBAMOL 500 MG PO TABS: 500.0000 mg | ORAL_TABLET | Freq: Three times a day (TID) | ORAL | Status: AC | PRN

## 2012-04-07 NOTE — ED Notes (Signed)
Pt c/o dizziness, sweating, hypertension, hyperglycemia, and ear ache.  Was seen at PCP on Monday for same.  Given meclizine for dizziness but has not resolved.  Vision blurry

## 2012-04-07 NOTE — Discharge Instructions (Signed)
Stop taking cyclobenzaprine. If you have muscle spasms, take methocarbamol instead. If symptoms are not improving, discuss with Dr. Algie Coffer about possible referral to a neurologist.  Dizziness Dizziness is a common problem. It is a feeling of unsteadiness or lightheadedness. You [name] feel like you are about to faint. Dizziness can lead to injury if you stumble or fall. A person of any age group can suffer from dizziness, but dizziness is more common in older adults. CAUSES  Dizziness can be caused by many different things, including:  Middle ear problems.   Standing for too long.   Infections.   An allergic reaction.   Aging.   An emotional response to something, such as the sight of blood.   Side effects of medicines.   Fatigue.   Problems with circulation or blood pressure.   Excess use of alcohol, medicines, or illegal drug use.   Breathing too fast (hyperventilation).   An arrhythmia or problems with your heart rhythm.   Low red blood cell count (anemia).   Pregnancy.   Vomiting, diarrhea, fever, or other illnesses that cause dehydration.   Diseases or conditions such as Parkinson's disease, high blood pressure (hypertension), diabetes, and thyroid problems.   Exposure to extreme heat.  DIAGNOSIS  To find the cause of your dizziness, your caregiver Zahra do a physical exam, lab tests, radiologic imaging scans, or an electrocardiography test (ECG).  TREATMENT  Treatment of dizziness depends on the cause of your symptoms and can vary greatly. HOME CARE INSTRUCTIONS   Drink enough fluids to keep your urine clear or pale yellow. This is especially important in very hot weather. In the elderly, it is also important in cold weather.   If your dizziness is caused by medicines, take them exactly as directed. When taking blood pressure medicines, it is especially important to get up slowly.   Rise slowly from chairs and steady yourself until you feel okay.   In the  morning, first sit up on the side of the bed. When this seems okay, stand slowly while holding onto something until you know your balance is fine.   If you need to stand in one place for a long time, be sure to move your legs often. Tighten and relax the muscles in your legs while standing.   If dizziness continues to be a problem, have someone stay with you for a day or two. Do this until you feel you are well enough to stay alone. Have the person call your caregiver if he or she notices changes in you that are concerning.   Do not drive or use heavy machinery if you feel dizzy.  SEEK IMMEDIATE MEDICAL CARE IF:   Your dizziness or lightheadedness gets worse.   You feel nauseous or vomit.   You develop problems with talking, walking, weakness, or using your arms, hands, or legs.   You are not thinking clearly or you have difficulty forming sentences. It [name] take a friend or family member to determine if your thinking is normal.   You develop chest pain, abdominal pain, shortness of breath, or sweating.   Your vision changes.   You notice any bleeding.   You have side effects from medicine that seems to be getting worse rather than better.  MAKE SURE YOU:   Understand these instructions.   Will watch your condition.   Will get help right away if you are not doing well or get worse.  Document Released: 05/05/2001 Document Revised: 10/29/2011 Document Reviewed:  05/29/2011 Exit[employer] Patient Information 2012 [place], [place].

## 2012-04-07 NOTE — ED Notes (Addendum)
Glucose 103 mg/dl

## 2012-04-07 NOTE — ED Provider Notes (Signed)
History     CSN: [id number]  Arrival date & time 04/07/12  1949   First MD Initiated Contact with Patient 04/07/12 2036      Chief Complaint  Patient presents with  . Dizziness    (Consider location/radiation/quality/duration/timing/severity/associated sxs/prior treatment) The history is provided by the patient.   66 year old female comes in because of dizziness for the last 3 days. She describes a lightheaded feeling which is worse when standing up. She has felt off balance. She denies nausea, vomiting, chest pain. She has had a left-sided earache. She saw her PCP 3 days ago who started her on meclizine but it is not helped her. Symptoms are getting worse today. Symptoms are moderate to severe.  Past Medical History  Diagnosis Date  . Diabetes mellitus   . Hypertension   . GERD (gastroesophageal reflux disease)   . Herniated disc   . Arthritis   . Shortness of breath on exertion 11/10/11    "sometimes"  . H/O hiatal hernia   . Headache   . Hemorrhoids   . Chronic back pain greater than 3 months duration     Past Surgical History  Procedure Date  . Knee surgery     right  . Shoulder open rotator cuff repair 07/20/2011    left  . Fracture surgery 2004?    right shoulder  . Knee arthroscopy 2004    right  . Tonsillectomy   . Cholecystectomy ~ 2008  . Abdominal hysterectomy 1997?    "partial"  . Neck fusion     History reviewed. No pertinent family history.  History  Substance Use Topics  . Smoking status: Current Everyday Smoker -- 0.2 packs/day for 40 years    Types: Cigarettes  . Smokeless tobacco: Never Used   Comment: smoking cessation consult entered  . Alcohol Use: No    OB History    Grav Para Term Preterm Abortions TAB SAB Ect Mult Living                  Review of Systems  All other systems reviewed and are negative.    Allergies  Hydrocodone  Home Medications   Current Outpatient Rx  Name Route Sig Dispense Refill  . ATORVASTATIN  CALCIUM 20 MG PO TABS Oral Take 20 mg by mouth daily.      . CYCLOBENZAPRINE HCL 10 MG PO TABS Oral Take 10 mg by mouth 2 (two) times daily as needed. For muscle spasms.     Marland Kitchen ESOMEPRAZOLE MAGNESIUM 40 MG PO CPDR Oral Take 40 mg by mouth daily before breakfast.      . GLIMEPIRIDE 2 MG PO TABS Oral Take 2 mg by mouth daily before breakfast.    . LORATADINE-PSEUDOEPHEDRINE ER 5-120 MG PO TB12 Oral Take 1 tablet by mouth 2 (two) times daily.    Marland Kitchen LOSARTAN POTASSIUM-HCTZ 100-12.5 MG PO TABS Oral Take 1 tablet by mouth daily.      Marland Kitchen MECLIZINE HCL 12.5 MG PO TABS Oral Take 12.5 mg by mouth 3 (three) times daily as needed. For dizzy per patient    . METFORMIN HCL 500 MG PO TABS Oral Take 500 mg by mouth 2 (two) times daily with a meal.      . OXYCODONE-ACETAMINOPHEN 5-325 MG PO TABS  1-2 tablets po q 6 hours prn moderate to severe pain 20 tablet 0  . POTASSIUM CHLORIDE 10 MEQ PO TBCR Oral Take 20 mEq by mouth 2 (two) times daily.  BP 131/72  Pulse 66  Temp(Src) 98.2 F (36.8 C) (Oral)  Resp 20  SpO2 99%  Physical Exam  Nursing note and vitals reviewed.  66 year old female who is resting comfortably and in no acute distress. Vital signs are significant for mild hypertension with blood pressure 153/74. Oxygen saturation is 99% which is normal. Head is normocephalic and atraumatic. PERRLA, EOMI. There is no nystagmus. Fundi show no hemorrhage or exudate or papilledema. TMs are clear. Oropharynx is clear. Neck is nontender and supple without adenopathy or bruit. Dizziness is reproduced by head movement. Lungs are clear without rales, wheezes, rhonchi. Heart has regular rate rhythm without murmur. Abdomen is soft, flat, nontender without masses or hepatosplenomegaly. Extremities have no cyanosis or edema, full range of motion is present. Skin is warm and dry without rash. Neurologic: Mental status is normal, cranial nerves are intact. There no focal motor or sensory deficits. Finger to nose testing is  normal. On Romberg test, she tends to fall backward.  ED Course  Procedures (including critical care time)  Results for orders placed during the hospital encounter of 04/07/12  GLUCOSE, CAPILLARY      Component Value Range   Glucose-Capillary 103 (*) 70 - 99 (mg/dL)  CBC      Component Value Range   WBC 10.3  4.0 - 10.5 (K/uL)   RBC 4.43  3.87 - 5.11 (MIL/uL)   Hemoglobin 12.9  12.0 - 15.0 (g/dL)   HCT 09.8  11.9 - 14.7 (%)   MCV 86.9  78.0 - 100.0 (fL)   MCH 29.1  26.0 - 34.0 (pg)   MCHC 33.5  30.0 - 36.0 (g/dL)   RDW 82.9  56.2 - 13.0 (%)   Platelets 386  150 - 400 (K/uL)  DIFFERENTIAL      Component Value Range   Neutrophils Relative 43  43 - 77 (%)   Neutro Abs 4.4  1.7 - 7.7 (K/uL)   Lymphocytes Relative 46  12 - 46 (%)   Lymphs Abs 4.7 (*) 0.7 - 4.0 (K/uL)   Monocytes Relative 6  3 - 12 (%)   Monocytes Absolute 0.6  0.1 - 1.0 (K/uL)   Eosinophils Relative 5  0 - 5 (%)   Eosinophils Absolute 0.5  0.0 - 0.7 (K/uL)   Basophils Relative 1  0 - 1 (%)   Basophils Absolute 0.1  0.0 - 0.1 (K/uL)  BASIC METABOLIC PANEL      Component Value Range   Sodium 134 (*) 135 - 145 (mEq/L)   Potassium 3.9  3.5 - 5.1 (mEq/L)   Chloride 97  96 - 112 (mEq/L)   CO2 27  19 - 32 (mEq/L)   Glucose, Bld 102 (*) 70 - 99 (mg/dL)   BUN 19  6 - 23 (mg/dL)   Creatinine, Ser 8.65 (*) 0.50 - 1.10 (mg/dL)   Calcium 9.8  8.4 - 78.4 (mg/dL)   GFR calc non Af Amer 50 (*) >90 (mL/min)   GFR calc Af Amer 58 (*) >90 (mL/min)   Mr Brain Wo Contrast  04/07/2012  *RADIOLOGY REPORT*  Clinical Data: Severe dizziness.  Left sided headache.  Left ear ache.  MRI HEAD WITHOUT CONTRAST  Technique:  Multiplanar, multiecho pulse sequences of the brain and surrounding structures were obtained according to standard protocol without intravenous contrast.  Comparison: MRI brain 11/11/2011.  Findings: The diffusion weighted images demonstrate no evidence for acute or subacute infarction.  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  A sub centimeter pineal cyst is stable.  No hemorrhage or mass lesion is present.  There is no significant white matter disease.  The ventricles are of normal size.  No significant extra-axial fluid collection is present.  Mild mucosal thickening is present in the maxillary sinuses bilaterally.  There is minimal mucosal thickening in the anterior ethmoid air cells.  The paranasal sinuses are otherwise clear.  The mastoid air cells are clear.  IMPRESSION:  1.  Normal MRI appearance of the brain. 2.  Minimal sinus disease.  Original Report Authenticated By: Jamesetta Orleans. MATTERN, M.D.      ECG shows normal sinus rhythm with a rate of 58, no ectopy. Normal axis. Normal P wave. Normal QRS. Normal intervals. Normal ST and T waves. Impression: normal ECG. When compared with ECG of 11/07/2011, no significant changes are seen.   1. Vertigo       MDM  Dizziness which is worrisome for central vertigo given her failure to respond to meclizine and significant balance problem. MRI scan has been ordered. Old records are reviewed and it is noted that she was admitted in December for facial numbness and had a negative MRI at that time.  MRI is negative for stroke. He was given a dose of Ativan with slight subjective improvement. It is also noted that she is taking cyclobenzaprine on an as needed basis. She did take it yesterday. This might be causing some of her dizziness as well. She is advised to discontinue the cyclobenzaprine and she is given a prescription for methocarbamol to replace it. She is sent with a prescription for lorazepam and is to followup with her PCP.        Dione Booze, MD 04/07/12 [id number]

## 2012-04-07 NOTE — ED Notes (Addendum)
BS taken noted to be 103; Orthostatic VS taken; Family and Dr. Preston Fleeting at bedside.

## 2012-04-07 NOTE — ED Notes (Signed)
Pt watching TV. Family at bedside.

## 2012-04-07 NOTE — ED Notes (Signed)
Patient transported to MRI 

## 2012-04-07 NOTE — ED Notes (Signed)
Lab at bedside

## 2012-04-28 ENCOUNTER — Encounter (HOSPITAL_COMMUNITY): Payer: Self-pay | Admitting: *Deleted

## 2012-04-28 ENCOUNTER — Emergency Department (HOSPITAL_COMMUNITY)
Admission: EM | Admit: 2012-04-28 | Discharge: 2012-04-29 | Disposition: A | Discharge status: Home / Self Care | Payer: Medicare Other | Attending: Emergency Medicine | Admitting: Emergency Medicine

## 2012-04-28 DIAGNOSIS — F17200 Nicotine dependence, unspecified, uncomplicated: Secondary | ICD-10-CM | POA: Insufficient documentation

## 2012-04-28 DIAGNOSIS — R42 Dizziness and giddiness: Secondary | ICD-10-CM | POA: Insufficient documentation

## 2012-04-28 DIAGNOSIS — R262 Difficulty in walking, not elsewhere classified: Secondary | ICD-10-CM | POA: Insufficient documentation

## 2012-04-28 DIAGNOSIS — E119 Type 2 diabetes mellitus without complications: Secondary | ICD-10-CM | POA: Insufficient documentation

## 2012-04-28 DIAGNOSIS — I10 Essential (primary) hypertension: Secondary | ICD-10-CM | POA: Insufficient documentation

## 2012-04-28 MED ORDER — DIAZEPAM 5 MG PO TABS
5.0000 mg | ORAL_TABLET | Freq: Once | ORAL | Status: AC
  Administered 2012-04-28: 5 mg via ORAL
  Filled 2012-04-28: qty 1

## 2012-04-28 NOTE — ED Notes (Signed)
Pt was seen here on 5/16 for tx of dizziness.  She stated they did an MRI and they found that "there wasn't good blood flow to her brain", but they released her.  Since then, the symptoms have not resolved, but only gotten worse.  Pt states she came in tonight b/c she felt dizzy even while laying down.  Pt appears sleepy.

## 2012-04-28 NOTE — ED Provider Notes (Signed)
History     CSN: [id number]  Arrival date & time 04/28/12  2235   First MD Initiated Contact with Patient 04/28/12 2304      Chief Complaint  Patient presents with  . Dizziness      HPI  the patient reports approximately one month of dizziness.  When his she describes her d difficulty with her balance and walking as well as a sense that the room is spinning.  She had an MRI scan of her brain done on 04/07/2012 that showed no evidence of an acute infarct.  Her MRI was done at that time secondary to her symptoms she reports of the same.  She reports her symptoms persist.  At that time she was discharged home with Ativan and she reports some improvement with her Ativan.  She saw her primary care physician who placed her back on meclizine despite the fact meclizine was not helping in the first place.  She presents now with ongoing symptoms.  Her neurology followup is not until 2 weeks from today.  She denies new unilateral weakness.  She denies fevers or chills.  She has no neck pain or neck stiffness.  She denies chest pain shortness of breath.  She denies difficulty with her speech or thought.  Her symptoms are mild to moderate.    Past Medical History  Diagnosis Date  . Diabetes mellitus   . Hypertension   . GERD (gastroesophageal reflux disease)   . Herniated disc   . Arthritis   . Shortness of breath on exertion 11/10/11    "sometimes"  . H/O hiatal hernia   . Headache   . Hemorrhoids   . Chronic back pain greater than 3 months duration     Past Surgical History  Procedure Date  . Knee surgery     right  . Shoulder open rotator cuff repair 07/20/2011    left  . Fracture surgery 2004?    right shoulder  . Knee arthroscopy 2004    right  . Tonsillectomy   . Cholecystectomy ~ 2008  . Abdominal hysterectomy 1997?    "partial"  . Neck fusion     No family history on file.  History  Substance Use Topics  . Smoking status: Current Everyday Smoker -- 0.2 packs/day for 40  years    Types: Cigarettes  . Smokeless tobacco: Never Used   Comment: smoking cessation consult entered  . Alcohol Use: No    OB History    Grav Para Term Preterm Abortions TAB SAB Ect Mult Living                  Review of Systems  All other systems reviewed and are negative.    Allergies  Hydrocodone and Percocet  Home Medications   Current Outpatient Rx  Name Route Sig Dispense Refill  . ATORVASTATIN CALCIUM 20 MG PO TABS Oral Take 20 mg by mouth daily.      Marland Kitchen ESOMEPRAZOLE MAGNESIUM 40 MG PO CPDR Oral Take 40 mg by mouth daily before breakfast.      . GLIMEPIRIDE 2 MG PO TABS Oral Take 2 mg by mouth daily before breakfast.    . LORATADINE-PSEUDOEPHEDRINE ER 5-120 MG PO TB12 Oral Take 1 tablet by mouth 2 (two) times daily.    Marland Kitchen LOSARTAN POTASSIUM-HCTZ 100-12.5 MG PO TABS Oral Take 1 tablet by mouth daily.      Marland Kitchen MECLIZINE HCL 12.5 MG PO TABS Oral Take 12.5 mg by mouth 3 (three)  times daily as needed. For dizzy per patient    . METFORMIN HCL 500 MG PO TABS Oral Take 500 mg by mouth 2 (two) times daily with a meal.      . METHOCARBAMOL 500 MG PO TABS Oral Take 500 mg by mouth daily as needed. For muscle cramps    . POTASSIUM CHLORIDE 10 MEQ PO TBCR Oral Take 20 mEq by mouth 2 (two) times daily.       BP 145/69  Pulse 54  Temp(Src) 97.9 F (36.6 C) (Oral)  Resp 16  SpO2 100%  Physical Exam  Nursing note and vitals reviewed. Constitutional: She is oriented to person, place, and time. She appears well-developed and well-nourished. No distress.  HENT:  Head: Normocephalic and atraumatic.  Eyes: EOM are normal. Pupils are equal, round, and reactive to light.  Neck: Normal range of motion.  Cardiovascular: Normal rate, regular rhythm and normal heart sounds.   Pulmonary/Chest: Effort normal and breath sounds normal.  Abdominal: Soft. She exhibits no distension. There is no tenderness.  Musculoskeletal: Normal range of motion.  Neurological: She is alert and oriented  to person, place, and time.       5/5 strength in major muscle groups of  bilateral upper and lower extremities. Speech normal. No facial asymetry.  Normal finger to nose bilaterally.  Gait normal  Skin: Skin is warm and dry.  Psychiatric: She has a normal mood and affect. Judgment normal.    ED Course  Procedures (including critical care time)  Labs Reviewed  GLUCOSE, CAPILLARY - Abnormal; Notable for the following:    Glucose-Capillary 113 (*)    All other components within normal limits   No results found.   1. Vertigo       MDM  The pt feels much better after Valium in the emergency department.  Her MRI the other day was without evidence of acute stroke.  I suspect this is vertigo.  She was instructed in the Apley maneuver.  She will attempt this at home.  There is no indication for repeat imaging today.  Her neuro exam is otherwise normal.  Close followup with the neurologist is recommended.  PCP followup as needed        Lyanne Co, MD 04/29/12 [phone]

## 2012-04-29 MED ORDER — DIAZEPAM 5 MG PO TABS: 5.0000 mg | ORAL_TABLET | Freq: Four times a day (QID) | ORAL | Status: AC | PRN

## 2012-04-29 NOTE — Discharge Instructions (Signed)
[employer] Patient Information 2012 [place], [place].Vertigo Vertigo means you feel like you or your surroundings are moving when they are not. Vertigo can be dangerous if it occurs when you are at work, driving, or performing difficult activities.  CAUSES  Vertigo occurs when there is a conflict of signals sent to your brain from the visual and sensory systems in your body. There are many different causes of vertigo, including:  Infections, especially in the inner ear.   A bad reaction to a drug or misuse of alcohol and medicines.   Withdrawal from drugs or alcohol.   Rapidly changing positions, such as lying down or rolling over in bed.   A migraine headache.   Decreased blood flow to the brain.   Increased pressure in the brain from a head injury, infection, tumor, or bleeding.  SYMPTOMS  You Averianna feel as though the world is spinning around or you are falling to the ground. Because your balance is upset, vertigo can cause nausea and vomiting. You Sehaj have involuntary eye movements (nystagmus). DIAGNOSIS  Vertigo is usually diagnosed by physical exam. If the cause of your vertigo is unknown, your caregiver Keltie perform imaging tests, such as an MRI scan (magnetic resonance imaging). TREATMENT  Most cases of vertigo resolve on their own, without treatment. Depending on the cause, your caregiver Celestine prescribe certain medicines. If your vertigo is related to body position issues, your caregiver Carolyne recommend movements or procedures to correct the problem. In rare cases, if your vertigo is caused by certain inner ear problems, you Karigan need surgery. HOME CARE INSTRUCTIONS   Follow your caregiver's instructions.   Avoid driving.   Avoid operating heavy machinery.   Avoid performing any tasks that would be dangerous to you or others during a vertigo episode.   Tell your caregiver if you notice that certain medicines seem to be causing your vertigo. Some of the medicines used to treat vertigo  episodes can actually make them worse in some people.  SEEK IMMEDIATE MEDICAL CARE IF:   Your medicines do not relieve your vertigo or are making it worse.   You develop problems with talking, walking, weakness, or using your arms, hands, or legs.   You develop severe headaches.   Your nausea or vomiting continues or gets worse.   You develop visual changes.   A family member notices behavioral changes.   Your condition gets worse.  MAKE SURE YOU:  Understand these instructions.   Will watch your condition.   Will get help right away if you are not doing well or get worse.  Document Released: 08/19/2005 Document Revised: 10/29/2011 Document Reviewed: 05/28/2011 [facility] Patient Information 2012 [place], [place].Vertigo Vertigo means you feel like you or your surroundings are moving when they are not. Vertigo can be dangerous if it occurs when you are at work, driving, or performing difficult activities.  CAUSES  Vertigo occurs when there is a conflict of signals sent to your brain from the visual and sensory systems in your body. There are many different causes of vertigo, including:  Infections, especially in the inner ear.   A bad reaction to a drug or misuse of alcohol and medicines.   Withdrawal from drugs or alcohol.   Rapidly changing positions, such as lying down or rolling over in bed.   A migraine headache.   Decreased blood flow to the brain.   Increased pressure in the brain from a head injury, infection, tumor, or bleeding.  SYMPTOMS  You Alaine feel  as though the world is spinning around or you are falling to the ground. Because your balance is upset, vertigo can cause nausea and vomiting. You Aleia have involuntary eye movements (nystagmus). DIAGNOSIS  Vertigo is usually diagnosed by physical exam. If the cause of your vertigo is unknown, your caregiver Quintessa perform imaging tests, such as an MRI scan (magnetic resonance imaging). TREATMENT  Most cases of vertigo  resolve on their own, without treatment. Depending on the cause, your caregiver Alya prescribe certain medicines. If your vertigo is related to body position issues, your caregiver Nadalyn recommend movements or procedures to correct the problem. In rare cases, if your vertigo is caused by certain inner ear problems, you Seletha need surgery. HOME CARE INSTRUCTIONS   Follow your caregiver's instructions.   Avoid driving.   Avoid operating heavy machinery.   Avoid performing any tasks that would be dangerous to you or others during a vertigo episode.   Tell your caregiver if you notice that certain medicines seem to be causing your vertigo. Some of the medicines used to treat vertigo episodes can actually make them worse in some people.  SEEK IMMEDIATE MEDICAL CARE IF:   Your medicines do not relieve your vertigo or are making it worse.   You develop problems with talking, walking, weakness, or using your arms, hands, or legs.   You develop severe headaches.   Your nausea or vomiting continues or gets worse.   You develop visual changes.   A family member notices behavioral changes.   Your condition gets worse.  MAKE SURE YOU:  Understand these instructions.   Will watch your condition.   Will get help right away if you are not doing well or get worse.  Document Released: 08/19/2005 Document Revised: 10/29/2011 Document Reviewed: 05/28/2011 [facility] Patient Information 2012 [place], [place].

## 2012-04-29 NOTE — ED Notes (Signed)
Pt denies any further questions upon discharge. 

## 2012-06-27 ENCOUNTER — Ambulatory Visit: Payer: Medicare Other | Admitting: Physical Therapy

## 2012-07-01 ENCOUNTER — Ambulatory Visit: Payer: Medicare Other | Attending: Otolaryngology | Admitting: Physical Therapy

## 2012-07-01 DIAGNOSIS — R279 Unspecified lack of coordination: Secondary | ICD-10-CM | POA: Insufficient documentation

## 2012-07-01 DIAGNOSIS — R42 Dizziness and giddiness: Secondary | ICD-10-CM | POA: Insufficient documentation

## 2012-07-01 DIAGNOSIS — IMO0001 Reserved for inherently not codable concepts without codable children: Secondary | ICD-10-CM | POA: Insufficient documentation

## 2012-07-12 ENCOUNTER — Ambulatory Visit: Payer: Medicare Other | Admitting: Physical Therapy

## 2012-07-19 ENCOUNTER — Ambulatory Visit: Payer: Medicare Other | Admitting: Physical Therapy

## 2012-07-26 ENCOUNTER — Ambulatory Visit: Payer: Medicare Other | Attending: Otolaryngology | Admitting: Physical Therapy

## 2012-07-26 DIAGNOSIS — R279 Unspecified lack of coordination: Secondary | ICD-10-CM | POA: Insufficient documentation

## 2012-07-26 DIAGNOSIS — IMO0001 Reserved for inherently not codable concepts without codable children: Secondary | ICD-10-CM | POA: Insufficient documentation

## 2012-07-26 DIAGNOSIS — R42 Dizziness and giddiness: Secondary | ICD-10-CM | POA: Insufficient documentation

## 2012-08-02 ENCOUNTER — Encounter: Payer: Medicare Other | Admitting: Physical Therapy

## 2012-08-09 ENCOUNTER — Encounter: Payer: Medicare Other | Admitting: Physical Therapy

## 2012-08-16 ENCOUNTER — Encounter: Payer: Medicare Other | Admitting: Physical Therapy

## 2013-04-26 ENCOUNTER — Other Ambulatory Visit: Payer: Self-pay | Admitting: Neurosurgery

## 2013-04-26 DIAGNOSIS — M542 Cervicalgia: Secondary | ICD-10-CM

## 2013-05-08 ENCOUNTER — Other Ambulatory Visit: Payer: Medicare Other

## 2013-05-17 ENCOUNTER — Other Ambulatory Visit: Payer: Medicare Other

## 2013-05-23 ENCOUNTER — Ambulatory Visit
Admission: RE | Admit: 2013-05-23 | Discharge: 2013-05-23 | Disposition: A | Discharge status: Home / Self Care | Payer: Medicare Other | Source: Ambulatory Visit | Attending: Neurosurgery | Admitting: Neurosurgery

## 2013-05-23 DIAGNOSIS — M542 Cervicalgia: Secondary | ICD-10-CM

## 2013-06-17 ENCOUNTER — Observation Stay (HOSPITAL_COMMUNITY)
Admission: EM | Admit: 2013-06-17 | Discharge: 2013-06-18 | Disposition: A | Discharge status: Home / Self Care | Payer: Medicare Other | Attending: Internal Medicine | Admitting: Internal Medicine

## 2013-06-17 ENCOUNTER — Emergency Department (HOSPITAL_COMMUNITY): Payer: Medicare Other

## 2013-06-17 ENCOUNTER — Encounter (HOSPITAL_COMMUNITY): Payer: Self-pay | Admitting: Emergency Medicine

## 2013-06-17 DIAGNOSIS — Z79899 Other long term (current) drug therapy: Secondary | ICD-10-CM | POA: Insufficient documentation

## 2013-06-17 DIAGNOSIS — F411 Generalized anxiety disorder: Secondary | ICD-10-CM | POA: Insufficient documentation

## 2013-06-17 DIAGNOSIS — E1149 Type 2 diabetes mellitus with other diabetic neurological complication: Secondary | ICD-10-CM | POA: Insufficient documentation

## 2013-06-17 DIAGNOSIS — R42 Dizziness and giddiness: Secondary | ICD-10-CM | POA: Insufficient documentation

## 2013-06-17 DIAGNOSIS — I10 Essential (primary) hypertension: Secondary | ICD-10-CM | POA: Insufficient documentation

## 2013-06-17 DIAGNOSIS — E1142 Type 2 diabetes mellitus with diabetic polyneuropathy: Secondary | ICD-10-CM | POA: Insufficient documentation

## 2013-06-17 DIAGNOSIS — E119 Type 2 diabetes mellitus without complications: Secondary | ICD-10-CM | POA: Diagnosis present

## 2013-06-17 DIAGNOSIS — G459 Transient cerebral ischemic attack, unspecified: Secondary | ICD-10-CM

## 2013-06-17 DIAGNOSIS — I951 Orthostatic hypotension: Principal | ICD-10-CM | POA: Insufficient documentation

## 2013-06-17 LAB — COMPREHENSIVE METABOLIC PANEL
AST: 20 U/L (ref 0–37)
BUN: 19 mg/dL (ref 6–23)
CO2: 27 mEq/L (ref 19–32)
Calcium: 10 mg/dL (ref 8.4–10.5)
Chloride: 99 mEq/L (ref 96–112)
Creatinine, Ser: 1.05 mg/dL (ref 0.50–1.10)
GFR calc Af Amer: 63 mL/min — ABNORMAL LOW (ref >90)
GFR calc non Af Amer: 54 mL/min — ABNORMAL LOW (ref >90)
Glucose, Bld: 139 mg/dL — ABNORMAL HIGH (ref 70–99)
Total Bilirubin: 0.4 mg/dL (ref 0.3–1.2)

## 2013-06-17 LAB — URINALYSIS, ROUTINE W REFLEX MICROSCOPIC
Glucose, UA: NEGATIVE mg/dL
Ketones, ur: NEGATIVE mg/dL
Nitrite: NEGATIVE
Protein, ur: 30 mg/dL — AB
Specific Gravity, Urine: 1.024 (ref 1.005–1.030)
Urobilinogen, UA: 0.2 mg/dL (ref 0.0–1.0)
pH: 5.5 (ref 5.0–8.0)

## 2013-06-17 LAB — POCT I-STAT, CHEM 8
BUN: 19 mg/dL (ref 6–23)
Calcium, Ion: 1.22 mmol/L (ref 1.13–1.30)
Chloride: 103 mEq/L (ref 96–112)
Creatinine, Ser: 1.3 mg/dL — ABNORMAL HIGH (ref 0.50–1.10)
Glucose, Bld: 142 mg/dL — ABNORMAL HIGH (ref 70–99)
HCT: 44 % (ref 36.0–46.0)
Hemoglobin: 15 g/dL (ref 12.0–15.0)
Potassium: 4 mEq/L (ref 3.5–5.1)
Sodium: 140 mEq/L (ref 135–145)
TCO2: 27 mmol/L (ref 0–100)

## 2013-06-17 LAB — CBC WITH DIFFERENTIAL/PLATELET
Eosinophils Relative: 3 % (ref 0–5)
HCT: 40.7 % (ref 36.0–46.0)
Hemoglobin: 14.3 g/dL (ref 12.0–15.0)
Lymphocytes Relative: 32 % (ref 12–46)
Lymphs Abs: 2.8 10*3/uL (ref 0.7–4.0)
MCV: 88.5 fL (ref 78.0–100.0)
Monocytes Absolute: 0.5 10*3/uL (ref 0.1–1.0)
Monocytes Relative: 6 % (ref 3–12)
Neutro Abs: 5.1 10*3/uL (ref 1.7–7.7)
WBC: 8.6 10*3/uL (ref 4.0–10.5)

## 2013-06-17 LAB — GLUCOSE, CAPILLARY
Glucose-Capillary: 131 mg/dL — ABNORMAL HIGH (ref 70–99)
Glucose-Capillary: 106 mg/dL — ABNORMAL HIGH (ref 70–99)

## 2013-06-17 LAB — PROTIME-INR
INR: 0.98 (ref 0.00–1.49)
Prothrombin Time: 12.8 seconds (ref 11.6–15.2)

## 2013-06-17 LAB — URINE MICROSCOPIC-ADD ON

## 2013-06-17 MED ORDER — MORPHINE SULFATE 4 MG/ML IJ SOLN
4.0000 mg | Freq: Once | INTRAMUSCULAR | Status: AC
  Administered 2013-06-17: 4 mg via INTRAVENOUS
  Filled 2013-06-17 (×2): qty 1

## 2013-06-17 MED ORDER — LORAZEPAM 1 MG PO TABS: 1.0000 mg | ORAL_TABLET | Freq: Four times a day (QID) | ORAL | Status: DC | PRN

## 2013-06-17 MED ORDER — LORAZEPAM 1 MG PO TABS
1.0000 mg | ORAL_TABLET | Freq: Once | ORAL | Status: AC
Start: 2013-06-17 — End: 2013-06-17
  Administered 2013-06-17: 1 mg via ORAL
  Filled 2013-06-17: qty 1

## 2013-06-17 MED ORDER — ONDANSETRON HCL 4 MG PO TABS: 4.0000 mg | ORAL_TABLET | Freq: Four times a day (QID) | ORAL | Status: DC | PRN

## 2013-06-17 MED ORDER — ONDANSETRON HCL 4 MG/2ML IJ SOLN: 4.0000 mg | Freq: Four times a day (QID) | INTRAMUSCULAR | Status: DC | PRN

## 2013-06-17 MED ORDER — ATORVASTATIN CALCIUM 20 MG PO TABS
20.0000 mg | ORAL_TABLET | Freq: Every day | ORAL | Status: DC
  Administered 2013-06-18: 20 mg via ORAL
  Filled 2013-06-17 (×2): qty 1

## 2013-06-17 MED ORDER — NAPROXEN 500 MG PO TABS
500.0000 mg | ORAL_TABLET | Freq: Two times a day (BID) | ORAL | Status: DC | PRN
  Administered 2013-06-17 – 2013-06-18 (×2): 500 mg via ORAL
  Filled 2013-06-17 (×2): qty 1

## 2013-06-17 MED ORDER — SODIUM CHLORIDE 0.9 % IV BOLUS (SEPSIS)
500.0000 mL | Freq: Once | INTRAVENOUS | Status: AC
  Administered 2013-06-17: 500 mL via INTRAVENOUS

## 2013-06-17 MED ORDER — ACETAMINOPHEN 325 MG PO TABS
650.0000 mg | ORAL_TABLET | Freq: Four times a day (QID) | ORAL | Status: DC | PRN
  Filled 2013-06-17: qty 2

## 2013-06-17 MED ORDER — SODIUM CHLORIDE 0.9 % IJ SOLN
3.0000 mL | Freq: Two times a day (BID) | INTRAMUSCULAR | Status: DC
  Administered 2013-06-18: 3 mL via INTRAVENOUS

## 2013-06-17 MED ORDER — INSULIN ASPART 100 UNIT/ML ~~LOC~~ SOLN: 0.0000 [IU] | Freq: Three times a day (TID) | SUBCUTANEOUS | Status: DC

## 2013-06-17 MED ORDER — MECLIZINE HCL 12.5 MG PO TABS
12.5000 mg | ORAL_TABLET | Freq: Three times a day (TID) | ORAL | Status: DC | PRN
  Filled 2013-06-17: qty 1

## 2013-06-17 MED ORDER — ENOXAPARIN SODIUM 40 MG/0.4ML ~~LOC~~ SOLN
40.0000 mg | SUBCUTANEOUS | Status: DC
  Administered 2013-06-17: 40 mg via SUBCUTANEOUS
  Filled 2013-06-17 (×2): qty 0.4

## 2013-06-17 MED ORDER — ACETAMINOPHEN 650 MG RE SUPP: 650.0000 mg | Freq: Four times a day (QID) | RECTAL | Status: DC | PRN

## 2013-06-17 MED ORDER — SODIUM CHLORIDE 0.9 % IV SOLN
INTRAVENOUS | Status: DC
  Administered 2013-06-17 – 2013-06-18 (×2): via INTRAVENOUS

## 2013-06-17 NOTE — ED Provider Notes (Signed)
Medical screening examination/treatment/procedure(s) were conducted as a shared visit with non-physician practitioner(s) and myself.  I personally evaluated the patient during the encounter   Loren Racer, MD 06/17/13 1500

## 2013-06-17 NOTE — ED Notes (Addendum)
Pt reports began yesterday with dizziness in the evening. Pt continue to have dizziness at present and a headache with numbness around her mouth. Pt has left sided facial droop that according to family is not normal. Pt talking in complete sentences without slurring. Equal grips B/L, no arm drift.

## 2013-06-17 NOTE — H&P (Signed)
Date: 06/17/2013               Patient Name:  [name] MRN: [id number]  DOB: [birth year] Age / Sex: 67 y.o., female   PCP: [name], MD         Medical Service: Internal Medicine Teaching Service         Attending Physician: Dr. Josem Kaufmann    First Contact: Dr. Aundria Rud Pager: [phone]  Second Contact: Dr. Manson Passey  Pager: [phone]       After Hours (After 5p/  First Contact Pager: [phone]  weekends / holidays): Second Contact Pager: [phone]   Chief Complaint: dizziness  History of Present Illness:  [name] is a 67 y/o woman with history of DM2, HTN who presents with dizziness x 1 day.  Pt states that her dizziness came on around noon yesterday when she was sitting still, has been worsening in intensity since.  Pt describes the dizziness as "like floating" but that the room was not spinning around her.  Pt does note that the dizziness was worse when she had her head leaning to the right in bed last night.  She states that this morning, she felt like she was going to lose her balance on the way to the bathroom but fortunately was able to make it back to the bed and sit down; there was no improvement with sitting.  Pt did not fall or hit her head, no LOC.  Pt denies any new medications other than restarting her metformin about one week ago.  She has not had nausea/vomiting but did have diarrhea for about 3 days after restarting metformin (which ended about 2 days prior to onset of dizziness).  She has also been diaphoretic since dizziness started.  Pt also states she has had some numbness around her mouth. Also numbness in her feet bilaterally, but this is a chronic problem from her diabetes.  Pt denies fever and ear pain.    Pt is also complaining of frontal headache that feels like a band around her head.  Headache came on around the same time as the dizziness and feels like a pressure sensation.  She tried tylenol for the pain which eased it some but back now.   In terms of her diabetes,  pt states that she checks her BG daily which has ranged from 130s to low 200s in past week during which time she has been regularly taking her metformin.  She had previously self-discontinued her metformin when her BGs had been below 100.   Of note, pt was unable to make her last check up appt with her PCP about a month and a half ago due to a death in the family.  She has been trying to re-schedule.  Meds: No current facility-administered medications for this encounter.   Current Outpatient Prescriptions  Medication Sig Dispense Refill  . amLODipine (NORVASC) 5 MG tablet Take 5 mg by mouth daily.      . cholecalciferol (VITAMIN D) 1000 UNITS tablet Take 1,000 Units by mouth daily.      . cyclobenzaprine (FLEXERIL) 10 MG tablet Take 10 mg by mouth 2 (two) times daily as needed for muscle spasms.      Marland Kitchen losartan-hydrochlorothiazide (HYZAAR) 100-12.5 MG per tablet Take 1 tablet by mouth daily.        . meclizine (ANTIVERT) 12.5 MG tablet Take 12.5 mg by mouth 3 (three) times daily as needed for dizziness.       Marland Kitchen  metFORMIN (GLUCOPHAGE) 500 MG tablet Take 500 mg by mouth 2 (two) times daily with a meal.        . potassium chloride (KLOR-CON) 10 MEQ CR tablet Take 20 mEq by mouth daily.         Allergies: Allergies as of 06/17/2013 - Review Complete 06/17/2013  Allergen Reaction Noted  . Hydrocodone Itching 02/21/2012  . Percocet (oxycodone-acetaminophen) Itching 04/28/2012   Past Medical History  Diagnosis Date  . Diabetes mellitus   . Hypertension   . GERD (gastroesophageal reflux disease)   . Herniated disc   . Arthritis   . Shortness of breath on exertion 11/10/11    "sometimes"  . H/O hiatal hernia   . Headache(784.0)   . Hemorrhoids   . Chronic back pain greater than 3 months duration    Past Surgical History  Procedure Laterality Date  . Knee surgery      right  . Shoulder open rotator cuff repair  07/20/2011    left  . Fracture surgery  2004?    right shoulder  . Knee  arthroscopy  2004    right  . Tonsillectomy    . Cholecystectomy  ~ 2008  . Abdominal hysterectomy  1997?    "partial"  . Neck fusion     No family history on file. History   Social History  . Marital Status: Married    Spouse Name: N/A    Number of Children: N/A  . Years of Education: N/A   Occupational History  . Not on file.   Social History Main Topics  . Smoking status: Current Every Day Smoker -- 0.20 packs/day for 40 years    Types: Cigarettes  . Smokeless tobacco: Never Used     Comment: smoking cessation consult entered  . Alcohol Use: No  . Drug Use: No  . Sexually Active: No   Other Topics Concern  . Not on file   Social History Narrative  . No narrative on file    Review of Systems: ROS General: no changes in weight, changes in appetite Skin: no rash HEENT: per HPI Pulm: no dyspnea, coughing, wheezing CV: no chest pain, palpitations, shortness of breath Abd: no abdominal pain, nausea/vomiting GU: no dysuria, hematuria, polyuria Ext: no arthralgias, myalgias Neuro: per HPI  Physical Exam: Blood pressure 135/61, pulse 57, temperature 98.3 F (36.8 C), temperature source Oral, resp. rate 17, height 5\' 7"  (1.702 m), weight 222 lb (100.699 kg), SpO2 98.00%. General: alert, cooperative, and in no apparent distress HEENT: pupils equal round and reactive to light, vision grossly intact, oropharynx clear and non-erythematous  Neck: supple, no lymphadenopathy, JVD, or carotid bruits Lungs: clear to ascultation bilaterally, normal work of respiration, no wheezes, rales, ronchi Heart: regular rate and rhythm, no murmurs, gallops, or rubs Abdomen: soft, non-tender, non-distended, normal bowel sounds Extremities: 2+ DP/PT pulses bilaterally, no cyanosis, clubbing, or edema Neurologic: alert & oriented X3, cranial nerves II-XII intact, strength 5/5 throughout, sensation intact to light touch, FTN, RAM normal bilaterally; negative Dix Hallpike maneuver *When we  sat pt up to perform lung exam, dizziness became acutely worse.   Lab results: Basic Metabolic Panel:  Recent Labs  16/10/96 1309 06/17/13 1310  NA 137 140  K 4.1 4.0  CL 99 103  CO2 27  --   GLUCOSE 139* 142*  BUN 19 19  CREATININE 1.05 1.30*  CALCIUM 10.0  --    Liver Function Tests:  Recent Labs  06/17/13 1309  AST 20  ALT 11  ALKPHOS 130*  BILITOT 0.4  PROT 7.8  ALBUMIN 3.9    Recent Labs  06/17/13 1309 06/17/13 1310  WBC 8.6  --   NEUTROABS 5.1  --   HGB 14.3 15.0  HCT 40.7 44.0  MCV 88.5  --   PLT 374  --    CBG:  Recent Labs  06/17/13 1218  GLUCAP 131*   Coagulation:  Recent Labs  06/17/13 1309  LABPROT 12.8  INR 0.98   Urinalysis:  Recent Labs  06/17/13 1318  COLORURINE AMBER*  LABSPEC 1.024  PHURINE 5.5  GLUCOSEU NEGATIVE  HGBUR SMALL*  BILIRUBINUR SMALL*  KETONESUR NEGATIVE  PROTEINUR 30*  UROBILINOGEN 0.2  NITRITE NEGATIVE  LEUKOCYTESUR SMALL*    Imaging results:  Ct Head Wo Contrast  06/17/2013   *RADIOLOGY REPORT*  Clinical Data: Dizziness and headache.  Numbness surround the mouth.  Left facial droop  CT HEAD WITHOUT CONTRAST  Technique:  Contiguous axial images were obtained from the base of the skull through the vertex without contrast.  Comparison: 11/07/2011  Findings: There is no mass effect, midline shift, or acute intracranial hemorrhage.  Brain parenchyma and ventricles system is within normal limits for age.  Visualized paranasal sinuses and mastoid air cells are clear.  IMPRESSION: No acute intracranial pathology.  Negative head CT.   Original Report Authenticated By: Jolaine Click, M.D.    Other results: EKG: sinus bradycardia, mild left axis deviation   Assessment & Plan by Problem: Ms. Guilbert is a 67 y/o woman with DM2 and HTN who presents with dizziness, likely orthostatic hypotension.  #Dizziness- Orthostatic hypotension vs. BPPV.  CT head on 7/26 negative for acute intracranial pathology.  Pt had similar  symptoms of perioral numbness in 12/12, MRI brain negative for stroke. Pt also came to the ED in 5/13 for dizziness very similar to this episode, had another MRI brain negative for stroke at that time; pt was also given meclazine with little improvement and lorazepam with some improvement.  This admission, pt's dizziness seems likely due to orthostatic hypotension in context of diarrhea for three days ending a couple of days ago and diaphoresis since dizziness started.  On exam, pt noted that her dizziness was markedly worse when we sat her up.  Will give IVF and check orthostatic vitals, see if dizziness resolves.  Less likely BPPV given negative Dix Hallpike maneuver, little relief with meclazine, attempted Apley maneuvers in past.  Of note, pt seemed to have anxiety about her dizziness and was even tearful at the end of our interview about fear of having a stroke.  For these reasons and since pt got some relief from dizziness in the past with lorazepam, will try this to see if we can get dizziness controlled.  -telemetry in case dizziness secondary to a fib or arrhythmia -orthostatic vitals -holding antihypertensives in setting of possible orthostatic hypotension -PT/OT vestibular eval -continue meclazine -continue cyclobenzaprine prn for neck strain -lorazepam prn for anxiety  -acetominophen, naprosen prn for headache -will consider gabapentin at discharge for diabetic neuropathy if dizziness is resolved (since dizziness is a side effect of this medication in a significant proportion of pts)  #Diarrhea, resolved- Pt had about 3 days of diarrhea starting about a week ago when she restarted her Metformin.  Possible gastroenteritis vs. Metformin use.  -holding metformin given inpatient status  #DM- last A1C 6.1% in 2/10.  Pt takes metformin only at home and had not been taking this consistently until about one week  ago.  -check A1C -SSI, CBGs -continue statin  #HTN- holding antihypertensives  (amlodipine, losartan-hctz) in context of possible orthostatic hypotension.  -will restart once orthostatics negative  #DVT PPX- lovenox  Dispo: Disposition is deferred at this time, awaiting improvement of current medical problems. Anticipated discharge in approximately 1-2 day(s).   The patient does have a current PCP [name], MD) and does need an [facility] hospital follow-up appointment after discharge.   Signed: Rocco Serene, MD 06/17/2013, 3:54 PM

## 2013-06-17 NOTE — H&P (Signed)
Date: 06/17/2013               Patient Name:  [name] MRN: [id number]  DOB: May 20, 1946 Age / Sex: 67 y.o., female   PCP: [name], MD              Medical Service: Internal Medicine Teaching Service              Attending Physician: Dr.Klima    First Contact: Dr. Aundria Rud Pager: [phone]  Second Contact: Dr. Manson Passey Pager: [phone]            After Hours (After 5p/  First Contact Pager: [phone]  weekends / holidays): Second Contact Pager: [phone]   Chief Complaint:   Dizziness and headache   History of Present Illness:   [name] is a 67 year old female with a PMH of DM and HTN who presents with dizziness and headache for 1 day. She reports acute onset of dizziness yesterday while sitting, that has been constant and worsening in severity. She describes the dizziness as lightheaded, experiencing a "floating" sensation, but denies room spinning. The dizziness worsens upon standing and walking around but is not relieved by anything, including sitting or meclazine. On walking to the bathroom this morning, she felt unsteady and had to brace herself due to the dizziness. But denies falling or losing consciousness. She reports accompanying numbness and tingling around her mouth and up the right side of her face to her eye, describing it as a pulling feeling. She endorses numbness and tingling in her feet, stating they feel heavy and weak. She denies any other focal numbness or weakness. Additionally, [name] reports headache for several days that worsened with the dizziness. She describes the headache as a pressure in her forehead and temples, endorsing it feels like a tight band. She attributed it to sinus pain and took tylenol sinus with no relief. Otherwise, she denies dizziness with sudden changes in head movements. She denies nausea or vomiting, any changes in vision, hearing or speech with onset. 3 days prior to onset of headache and dizziness, [name] admits to having nausea  and diarrhea, that have since resolved. She admits her blood sugar has run high for the past week (ranging between 130-200), for which she started taking her metformin. She had not been taking her metformin regularly prior. She endorses taking all her other medications regularly, and denies recent new medications.   CT was done in the ED and was negative. CBC and BMET within normal limits on admission.    Meds: Current Facility-Administered Medications  Medication Dose Route Frequency Provider Last Rate Last Dose  . sodium chloride 0.9 % bolus 500 mL  500 mL Intravenous Once Dorthula Matas, PA-C 500 mL/hr at 06/17/13 1453 500 mL at 06/17/13 1453   Current Outpatient Prescriptions  Medication Sig Dispense Refill  . amLODipine (NORVASC) 5 MG tablet Take 5 mg by mouth daily.      . cholecalciferol (VITAMIN D) 1000 UNITS tablet Take 1,000 Units by mouth daily.      . cyclobenzaprine (FLEXERIL) 10 MG tablet Take 10 mg by mouth 2 (two) times daily as needed for muscle spasms.      Marland Kitchen losartan-hydrochlorothiazide (HYZAAR) 100-12.5 MG per tablet Take 1 tablet by mouth daily.        . meclizine (ANTIVERT) 12.5 MG tablet Take 12.5 mg by mouth 3 (three) times daily as needed for dizziness.       . metFORMIN (  GLUCOPHAGE) 500 MG tablet Take 500 mg by mouth 2 (two) times daily with a meal.        . potassium chloride (KLOR-CON) 10 MEQ CR tablet Take 20 mEq by mouth daily.         Allergies: Allergies as of 06/17/2013 - Review Complete 06/17/2013  Allergen Reaction Noted  . Hydrocodone Itching 02/21/2012  . Percocet (oxycodone-acetaminophen) Itching 04/28/2012   Past Medical History  Diagnosis Date  . Diabetes mellitus   . Hypertension   . GERD (gastroesophageal reflux disease)   . Herniated disc   . Arthritis   . Shortness of breath on exertion 11/10/11    "sometimes"  . H/O hiatal hernia   . Headache(784.0)   . Hemorrhoids   . Chronic back pain greater than 3 months duration    Past  Surgical History  Procedure Laterality Date  . Knee surgery      right  . Shoulder open rotator cuff repair  07/20/2011    left  . Fracture surgery  2004?    right shoulder  . Knee arthroscopy  2004    right  . Tonsillectomy    . Cholecystectomy  ~ 2008  . Abdominal hysterectomy  1997?    "partial"  . Neck fusion     No family history on file. History   Social History  . Marital Status: Married    Spouse Name: N/A    Number of Children: N/A  . Years of Education: N/A   Occupational History  . Not on file.   Social History Main Topics  . Smoking status: Current Every Day Smoker -- 0.20 packs/day for 40 years    Types: Cigarettes  . Smokeless tobacco: Never Used     Comment: smoking cessation consult entered  . Alcohol Use: No  . Drug Use: No  . Sexually Active: No   Other Topics Concern  . Not on file   Social History Narrative  . No narrative on file    Review of Systems: As per HPI, in addition to:  General:  Reported generalized weakness/ heavy limbs, no fever, chills fatigue, no weight change or change in appetite  Skin: no rashes, no skin, hair or nail changes  Head: frontal/ temporal headache x3days  Eyes: no acute changes in vision, no blurriness, no pain or itching  Ears: No ear pain, no discharge, no hearing loss, tinnitus or vertigo  Nose, Sinuses: no congestion, no itching, allergy or epistaxis  Mouth, throat, neck: no sore throat, no hoarseness  Cardiac: no chest pain, palpitations, or dyspnea  Respiratory: no shortness of breath, no wheezing, no cough GI: nausea and diarrhea X3 days, resolved, no abdominal pain  Urinary: no frequency, no hesitancy, no pain or burning Neuro: positional dizziness   Physical Exam: Blood pressure 144/130, pulse 59, temperature 98.3 F (36.8 C), temperature source Oral, resp. rate 15, height 5\' 7"  (1.702 m), weight 100.699 kg (222 lb), SpO2 99.00%.  General: anxious, but alert and engaged and in no acute  distress HEENT: pupils equal, round and reactive to light, vision grossly intact, no scleral icterus or conjunctiva, moist mucous membranes Heart: regular rate and rhythm, no gallops or rubs appreciated  Lungs: good respiratory effort, lungs clear to ascultation bilaterally,no wheezes, rales or rhonchi  Abdomen: soft, non-tender abdomen, normal active bowel sounds, no rebound or guarding  Extremities: no peripheral edema bilaterally, 2+ pulses bilaterally, no clubbing or cyanosis  Neuro: alert and oriented x3, cranial nerves II-XII intact, strength 5/5  in upper and lower extremities bilaterally, sensation intact to light touch, normal FTN and RAM, negative dicks hallpike   Lab results: @LABTEST @  Imaging results:  Ct Head Wo Contrast  06/17/2013   *RADIOLOGY REPORT*  Clinical Data: Dizziness and headache.  Numbness surround the mouth.  Left facial droop  CT HEAD WITHOUT CONTRAST  Technique:  Contiguous axial images were obtained from the base of the skull through the vertex without contrast.  Comparison: 11/07/2011  Findings: There is no mass effect, midline shift, or acute intracranial hemorrhage.  Brain parenchyma and ventricles system is within normal limits for age.  Visualized paranasal sinuses and mastoid air cells are clear.  IMPRESSION: No acute intracranial pathology.  Negative head CT.   Original Report Authenticated By: Jolaine Click, M.D.    Other results: EKG: normal EKG, normal sinus rhythm, unchanged from previous tracings.  Assessment & Plan by Problem:  Mrs. Britain is a 67 year old female with a PMH of DM and HTN who presents with dizziness and headache for 1 day with accompanying numbness and tingling in her face and feet.   #Dizziness and headache  Patient presents with dizziness, described as lightheadedness that worsens upon standing and walking. Dizziness is most consistent with orthostatic hypotension due to volume depletion in the context of several days of diarrhea  following restarting routine metformin. Anxiety is also likely contributing to the presentation, as the patient appears overly anxious about a stroke. To note, during previous episodes of similar symptoms, the patient has experienced subjective relief with ativan.  BPPV was also considered due to documented history of vertigo, however it is less likely as the patient is not experiencing sensation of room spinning, dix Hallpike maneuver was negative (failed to reproduce symptoms or nystagmus), and the dizziness is not relieved by meclizine. TIA and ischemic stroke were considered, but ultimately a stoke workup was not initiated following a negative CT scan, and history of 2 negative MRI scans with similar presentation of dizziness in the past two years. The dizziness is positional in nature with no accompanying focal numbness or weakness. The facial numbness and tingling is less concerning due to similar presentation previously. Medication side effect is considered as the patient is taking cyclobenzaprine PRN for back pain, however less likely as the patient does not report use in the past several days.  - Orthostatic BP measurement - IV NS 100 ml/hr  - PT/OT vestbular evaluation - Telemetry overnight to rule out cardiac causes  - continue meclazine PRN - lorazepam for anxiety  - naproxen PRN for headache   # Diarrhea   Patient experienced 3 days of nausea and diarrhea prior to onset of symptoms. Diarrhea most likely due to restarting metformin but gastroenteritis is also a likely cause.  - IV NS 128ml/hr  #Diabetes  Patient reports elevated blood sugars lately, for which she has restarted taking her metformin. She reports new numbness and tingling in her feet bilaterally consistent with diabetic neuropathy. Hemoglobin A1C was last checked in 2010. Recorded at 6.1%.  - Sensitive sliding scale insulin for hospital stay - Hb A1c to gauge status of diabetes control  - Consider gabapentin for neuropathy  upon discharge, hold inpatient in the context of her dizziness  #HTN  Patient has history of hypertension and is maintaining normotensive blood pressures in the 140s. - Hold amlodipine and losartan-hydrochlorothiazide in the context of orthostatic hypotension   This is a Psychologist, occupational Note.  The care of the patient was discussed with Dr.  Rogers and the assessment and plan was formulated with their assistance.  Please see their note for official documentation of the patient encounter.   Signed: Everardo All, Med Student 06/17/2013, 3:42 PM

## 2013-06-17 NOTE — ED Provider Notes (Signed)
CSN: [id number]     Arrival date & time 06/17/13  1146 History     First MD Initiated Contact with Patient 06/17/13 1233     Chief Complaint  Patient presents with  . Dizziness   (Consider location/radiation/quality/duration/timing/severity/associated sxs/prior Treatment) HPI  [name] is a 67 y.o.female with a significant PMH of diabetes, hypertension, GERD, arthritis, headaches, dizziness, hemorrhoids, back pain presents to the ER with complaints of headache, generalized fatigue, dizziness with numbness around her mouth, dysuria that started yesterday. Patient has had problems with dizziness off and on for over a year and takes meclizine which she does not feel like help her dizziness.  The patient's husband said that for a small period of time before her arrival to the ER she had a facial droop which resolved. The patient was not aware that this happened.  The patient however does not feel like this dizziness is like any dizziness but she has had before. Is accompanied by headache and what she describes as difficulty ambulating. She also has a headache which is new for her and is described as down to light and strong. She's not had any focal weakness, dysphasia, aphasia, change in vision, nausea, vomiting, diarrhea, URI symptoms, fevers.  Past Medical History  Diagnosis Date  . Diabetes mellitus   . Hypertension   . GERD (gastroesophageal reflux disease)   . Herniated disc   . Arthritis   . Shortness of breath on exertion 11/10/11    "sometimes"  . H/O hiatal hernia   . Headache(784.0)   . Hemorrhoids   . Chronic back pain greater than 3 months duration    Past Surgical History  Procedure Laterality Date  . Knee surgery      right  . Shoulder open rotator cuff repair  07/20/2011    left  . Fracture surgery  2004?    right shoulder  . Knee arthroscopy  2004    right  . Tonsillectomy    . Cholecystectomy  ~ 2008  . Abdominal hysterectomy  1997?    "partial"  . Neck  fusion     No family history on file. History  Substance Use Topics  . Smoking status: Current Every Day Smoker -- 0.20 packs/day for 40 years    Types: Cigarettes  . Smokeless tobacco: Never Used     Comment: smoking cessation consult entered  . Alcohol Use: No   OB History   Grav Para Term Preterm Abortions TAB SAB Ect Mult Living                 Review of Systems Review of systems is negative unless otherwise mentioned in history of present illness Allergies  Hydrocodone and Percocet  Home Medications   Current Outpatient Rx  Name  Route  Sig  Dispense  Refill  . amLODipine (NORVASC) 5 MG tablet   Oral   Take 5 mg by mouth daily.         . cholecalciferol (VITAMIN D) 1000 UNITS tablet   Oral   Take 1,000 Units by mouth daily.         . cyclobenzaprine (FLEXERIL) 10 MG tablet   Oral   Take 10 mg by mouth 2 (two) times daily as needed for muscle spasms.         Marland Kitchen losartan-hydrochlorothiazide (HYZAAR) 100-12.5 MG per tablet   Oral   Take 1 tablet by mouth daily.           . meclizine (ANTIVERT)  12.5 MG tablet   Oral   Take 12.5 mg by mouth 3 (three) times daily as needed for dizziness.          . metFORMIN (GLUCOPHAGE) 500 MG tablet   Oral   Take 500 mg by mouth 2 (two) times daily with a meal.           . potassium chloride (KLOR-CON) 10 MEQ CR tablet   Oral   Take 20 mEq by mouth daily.           BP 126/66  Pulse 58  Temp(Src) 98.3 F (36.8 C) (Oral)  Resp 13  Ht 5\' 7"  (1.702 m)  Wt 222 lb (100.699 kg)  BMI 34.76 kg/m2  SpO2 100% Physical Exam  Nursing note and vitals reviewed. Constitutional: She is oriented to person, place, and time. She appears well-developed and well-nourished. No distress.  HENT:  Head: Normocephalic and atraumatic.  Eyes: Pupils are equal, round, and reactive to light.  Neck: Normal range of motion. Neck supple.  Cardiovascular: Normal rate and regular rhythm.   Pulmonary/Chest: Effort normal.  Abdominal:  Soft.  Neurological: She is alert and oriented to person, place, and time. She has normal strength. No cranial nerve deficit or sensory deficit. She displays a negative Romberg sign. GCS eye subscore is 4. GCS verbal subscore is 5. GCS motor subscore is 6.  And has no neurological deficits at this time.  Skin: Skin is warm and dry.    ED Course   Procedures (including critical care time)  Labs Reviewed  GLUCOSE, CAPILLARY - Abnormal; Notable for the following:    Glucose-Capillary 131 (*)    All other components within normal limits  COMPREHENSIVE METABOLIC PANEL - Abnormal; Notable for the following:    Glucose, Bld 139 (*)    Alkaline Phosphatase 130 (*)    GFR calc non Af Amer 54 (*)    GFR calc Af Amer 63 (*)    All other components within normal limits  POCT I-STAT, CHEM 8 - Abnormal; Notable for the following:    Creatinine, Ser 1.30 (*)    Glucose, Bld 142 (*)    All other components within normal limits  CBC WITH DIFFERENTIAL  PROTIME-INR  URINALYSIS, ROUTINE W REFLEX MICROSCOPIC   Ct Head Wo Contrast  06/17/2013   *RADIOLOGY REPORT*  Clinical Data: Dizziness and headache.  Numbness surround the mouth.  Left facial droop  CT HEAD WITHOUT CONTRAST  Technique:  Contiguous axial images were obtained from the base of the skull through the vertex without contrast.  Comparison: 11/07/2011  Findings: There is no mass effect, midline shift, or acute intracranial hemorrhage.  Brain parenchyma and ventricles system is within normal limits for age.  Visualized paranasal sinuses and mastoid air cells are clear.  IMPRESSION: No acute intracranial pathology.  Negative head CT.   Original Report Authenticated By: Jolaine Click, M.D.   1. TIA (transient ischemic attack)     MDM  Differential diagnosis is : Vertigo, stroke, infection.  Patient has normal head CT. Lab work does not show any significant findings.  My attending has evaluated patient as well. Possible posterior stroke, will  need MRI.  Patient admitted, inpatient, Internal Medicine, [place]    Dorthula Matas, PA-C 06/17/13 1417

## 2013-06-18 DIAGNOSIS — I951 Orthostatic hypotension: Secondary | ICD-10-CM

## 2013-06-18 LAB — BASIC METABOLIC PANEL
BUN: 19 mg/dL (ref 6–23)
Chloride: 105 mEq/L (ref 96–112)
Glucose, Bld: 117 mg/dL — ABNORMAL HIGH (ref 70–99)
Potassium: 3.6 mEq/L (ref 3.5–5.1)

## 2013-06-18 LAB — GLUCOSE, CAPILLARY: Glucose-Capillary: 106 mg/dL — ABNORMAL HIGH (ref 70–99)

## 2013-06-18 LAB — URINE CULTURE
Colony Count: NO GROWTH
Culture: NO GROWTH

## 2013-06-18 MED ORDER — CITALOPRAM HYDROBROMIDE 10 MG PO TABS: 10.0000 mg | ORAL_TABLET | Freq: Every day | ORAL | Status: DC

## 2013-06-18 MED ORDER — ATORVASTATIN CALCIUM 20 MG PO TABS: 20.0000 mg | ORAL_TABLET | Freq: Every day | ORAL | Status: DC

## 2013-06-18 MED ORDER — CITALOPRAM HYDROBROMIDE 10 MG PO TABS
10.0000 mg | ORAL_TABLET | Freq: Every day | ORAL | Status: DC
  Administered 2013-06-18: 10 mg via ORAL
  Filled 2013-06-18: qty 1

## 2013-06-18 NOTE — Plan of Care (Signed)
Problem: Acute Rehab PT Goals Goal: Patient Will Transfer Sit To/From Stand 0/10 dizziness Goal: Pt Will Ambulate 0/10 dizziness

## 2013-06-18 NOTE — Discharge Summary (Signed)
Name: [name] MRN: [id number] DOB: 1946/07/01 67 y.o. PCP: [name], MD  Date of Admission: 06/17/2013 12:26 PM Date of Discharge: 06/18/2013 Attending Physician: Rocco Serene, MD  Discharge Diagnosis: 1. Orthostatic hypotension- IVF, discharged [name] on citalopram for anxiety about dizziness being stroke given multiple negative workups for stroke with similar sx; discontinued Flexeril as this Khristian cause dizziness 2. DM2, controlled- discontinued metformin due to A1C of 6.4 on 7/26 and inconsistent use of this medication causing diarrhea leading to volume depletion 3. HTN- held antihypertensives in context of orthostatic hypotension, re-started at discharge   Discharge Medications:   Medication List    STOP taking these medications       cyclobenzaprine 10 MG tablet  Commonly known as:  FLEXERIL     metFORMIN 500 MG tablet  Commonly known as:  GLUCOPHAGE      TAKE these medications       amLODipine 5 MG tablet  Commonly known as:  NORVASC  Take 5 mg by mouth daily.     atorvastatin 20 MG tablet  Commonly known as:  LIPITOR  Take 1 tablet (20 mg total) by mouth daily.     cholecalciferol 1000 UNITS tablet  Commonly known as:  VITAMIN D  Take 1,000 Units by mouth daily.     citalopram 10 MG tablet  Commonly known as:  CELEXA  Take 1 tablet (10 mg total) by mouth daily.     losartan-hydrochlorothiazide 100-12.5 MG per tablet  Commonly known as:  HYZAAR  Take 1 tablet by mouth daily.     meclizine 12.5 MG tablet  Commonly known as:  ANTIVERT  Take 12.5 mg by mouth 3 (three) times daily as needed for dizziness.     potassium chloride 10 MEQ CR tablet  Commonly known as:  KLOR-CON  Take 20 mEq by mouth daily.        Disposition and follow-up:   [name] was discharged from [facility] in Stable condition.  At the hospital follow up visit please address:  1.  Resolution of dizziness and initiation of [name] (given rx at discharge  per [name] recs); consider referral to ENT if sx do not resolve  2. Compliance with citalopram for anxiety   3.  Labs / imaging needed at time of follow-up: none  4.  Pending labs/ test needing follow-up: none  Follow-up Appointments: Follow-up Information   Schedule an appointment as soon as possible for a visit with [name], MD.   Contact information:   [address] [place] [address] [phone]     [name] will call Dr. Roseanne Kaufman office tomorrow to make an appt in August.   Discharge Instructions: Discharge Orders   Future Orders Complete By Expires     Call MD for:  persistant dizziness or light-headedness  As directed     Diet - low sodium heart healthy  As directed     Increase activity slowly  As directed         Procedures Performed:  Ct Head Wo Contrast  06/17/2013   *RADIOLOGY REPORT*  Clinical Data: Dizziness and headache.  Numbness surround the mouth.  Left facial droop  CT HEAD WITHOUT CONTRAST  Technique:  Contiguous axial images were obtained from the base of the skull through the vertex without contrast.  Comparison: 11/07/2011  Findings: There is no mass effect, midline shift, or acute intracranial hemorrhage.  Brain parenchyma and ventricles system is within normal limits  for age.  Visualized paranasal sinuses and mastoid air cells are clear.  IMPRESSION: No acute intracranial pathology.  Negative head CT.   Original Report Authenticated By: Jolaine Click, M.D.   Mr Cervical Spine Wo Contrast  05/23/2013   *RADIOLOGY REPORT*  Clinical Data: Increasing left-sided neck pain, left arm numbness  MRI CERVICAL SPINE WITHOUT CONTRAST  Technique:  Multiplanar and multiecho pulse sequences of the cervical spine, to include the craniocervical junction and cervicothoracic junction, were obtained according to standard protocol without intravenous contrast.  Comparison: None.  Findings: Vertebral bodies are normally aligned with preservation of the normal cervical  lordosis.  Vertebral body heights are preserved.  Degenerative disc desiccation with end plate irregularity is seen at the C5-6 level. Signal intensity from the vertebral body bone marrow and spinal cord is normal.  At C2-3, there is no canal or foraminal stenosis.  At C3-4, there is mild bilateral facet arthrosis and degenerative disc osteophyte complex.  No significant canal or neural foraminal stenosis.  At C4-5, there is left-sided facet arthrosis with mild degenerative disc osteophyte.  There is resultant moderate left neural foraminal narrowing.  There is no significant central canal or right neural foraminal stenosis.  At C5-6, there is broad-based degenerative disc osteophyte complex which partially effaces the ventral CSF and results in mild canal stenosis.  There is mild bilateral foraminal narrowing as wel.  At C6-7, there is degenerative disc osteophyte complex with a small superimposed shallow disc protrusion which flattens the left the ventral thecal sac and results in mild left neural foraminal stenosis.  No significant right neural foraminal narrowing.  At C7-T1, there is no canal or foraminal stenosis.  IMPRESSION: Degenerative disc disease with prominent left-sided facet arthrosis at C4-5 with resultant moderate left foraminal narrowing.  Additional mild multilevel degenerative disc disease as above.   Original Report Authenticated By: Rise Mu, M.D.    Admission HPI:  [name] is a 67 y/o woman with history of DM2, HTN who presents with dizziness x 1 day. [name] states that her dizziness came on around noon yesterday when she was sitting still, has been worsening in intensity since. [name] describes the dizziness as "like floating" but that the room was not spinning around her. [name] does note that the dizziness was worse when she had her head leaning to the right in bed last night. She states that this morning, she felt like she was going to lose her balance on the way to the bathroom but  fortunately was able to make it back to the bed and sit down; there was no improvement with sitting. [name] did not fall or hit her head, no LOC. [name] denies any new medications other than restarting her metformin about one week ago. She has not had nausea/vomiting but did have diarrhea for about 3 days after restarting metformin (which ended about 2 days prior to onset of dizziness). She has also been diaphoretic since dizziness started. [name] also states she has had some numbness around her mouth. Also numbness in her feet bilaterally, but this is a chronic problem from her diabetes. [name] denies fever and ear pain.  [name] is also complaining of frontal headache that feels like a band around her head. Headache came on around the same time as the dizziness and feels like a pressure sensation. She tried tylenol for the pain which eased it some but back now.  In terms of her diabetes, [name] states that she checks her BG daily which has ranged from  130s to low 200s in past week during which time she has been regularly taking her metformin. She had previously self-discontinued her metformin when her BGs had been below 100.  Of note, [name] was unable to make her last check up appt with her PCP about a month and a half ago due to a death in the family. She has been trying to re-schedule   Hospital Course by problem list:  1. Orthostatic hypotension- [name] presented to ED with dizziness x 1 day, felt like she "was floating" but room not spinning around her; also some associated perioral numbness.  CT head on 7/26 negative for acute intracranial pathology. [name] had similar symptoms of perioral numbness in 12/12, MRI brain negative for stroke. [name] also came to the ED in 5/13 for dizziness very similar to this episode, had another MRI brain negative for stroke at that time; [name] was also given meclazine with little improvement and lorazepam with some improvement. This admission, [name]'s dizziness seems likely due to orthostatic hypotension in  context of diarrhea for three days ending a couple of days ago after re-starting her metformin. On exam, [name] noted that her dizziness was markedly worse when we sat her up. Orthostatics yesterday afternoon and this morning have both been negative; however, [name] had already received fluid bolus prior to first set of orthostatic vitals and was receiving continuous fluids overnight prior to second set. Given [name]'s anxiety about her symptoms being stroke-related after multiple negative work-ups for stroke with similar symtoms in addition to [name]'s subjective decrease in dizziness with lorazepam (now twice- this admission and last), started citalopram 10mg  daily.  Less likely BPPV given negative Dix Hallpike maneuver, little relief with meclazine, and [name]'s resolving dizziness with IVF.  [name]/OT vestibular evaluation revealed no signs of BPPV but peripheral vestibular impairment possible.  [name] discharged with prescription for outpatient [name] and should follow-up with ENT if symptoms do not resolve.  We also discussed discontinuation of cyclobenzaprine with [name] as this Krizia exacerbate her dizziness.   2. DM2- last A1C 6.1% in 2/10, 6/4% this admission. [name] takes metformin only at home and had not been taking this medication consistently until about one week ago.  Given her A1C <7, discontinued this medication at discharge to prevent further diarrhea leading to orthostatic hypotension. [name] advised to continue her statin.   3. HTN- [name]'s antihypertensives (amlodipine, losartan-hctz) were held in context of orthostatic hypotension but re-started at discharge.    Discharge Vitals:   BP 127/57  Pulse 63  Temp(Src) 98.3 F (36.8 C) (Oral)  Resp 18  Ht 5\' 7"  (1.702 m)  Wt 222 lb (100.699 kg)  BMI 34.76 kg/m2  SpO2 98%  Discharge Labs:  Results for orders placed during the hospital encounter of 06/17/13 (from the past 24 hour(s))  GLUCOSE, CAPILLARY     Status: Abnormal   Collection Time    06/17/13  4:39 PM      Result  Value Range   Glucose-Capillary 106 (*) 70 - 99 mg/dL   Comment 1 Documented in Chart     Comment 2 Notify RN    GLUCOSE, CAPILLARY     Status: Abnormal   Collection Time    06/17/13  8:02 PM      Result Value Range   Glucose-Capillary 181 (*) 70 - 99 mg/dL  HEMOGLOBIN Z6X     Status: Abnormal   Collection Time    06/18/13  6:15 AM      Result Value Range  Hemoglobin A1C 6.4 (*) <5.7 %   Mean Plasma Glucose 137 (*) <117 mg/dL  BASIC METABOLIC PANEL     Status: Abnormal   Collection Time    06/18/13  6:15 AM      Result Value Range   Sodium 138  135 - 145 mEq/L   Potassium 3.6  3.5 - 5.1 mEq/L   Chloride 105  96 - 112 mEq/L   CO2 27  19 - 32 mEq/L   Glucose, Bld 117 (*) 70 - 99 mg/dL   BUN 19  6 - 23 mg/dL   Creatinine, Ser 4.09  0.50 - 1.10 mg/dL   Calcium 8.8  8.4 - 81.1 mg/dL   GFR calc non Af Amer 61 (*) >90 mL/min   GFR calc Af Amer 71 (*) >90 mL/min  GLUCOSE, CAPILLARY     Status: Abnormal   Collection Time    06/18/13  7:55 AM      Result Value Range   Glucose-Capillary 114 (*) 70 - 99 mg/dL   Comment 1 Documented in Chart     Comment 2 Notify RN    GLUCOSE, CAPILLARY     Status: Abnormal   Collection Time    06/18/13 12:41 PM      Result Value Range   Glucose-Capillary 106 (*) 70 - 99 mg/dL   Comment 1 Documented in Chart     Comment 2 Notify RN      Signed: Rocco Serene, MD 06/18/2013, 3:38 PM   Time Spent on Discharge: 35 minutes Services Ordered on Discharge: none Equipment Ordered on Discharge: none

## 2013-06-18 NOTE — Evaluation (Addendum)
Physical Therapy Evaluation Patient Details Name: Natane F Hecht MRN: [id number] DOB: August 08, 1946 Today's Date: 06/18/2013 Time: 4098-1191 PT Time Calculation (min): 48 min  PT Assessment / Plan / Recommendation History of Present Illness    Mrs. Lennox is a 67 year old female with a PMH of DM and HTN who presents with dizziness and headache for 1 day. She reports acute onset of dizziness yesterday while sitting, that has been constant and worsening in severity. She describes the dizziness as lightheaded, experiencing a "floating" sensation, but denies room spinning. The dizziness worsens upon standing and walking around but is not relieved by anything, including sitting or meclazine. On walking to the bathroom this morning, she felt unsteady and had to brace herself due to the dizziness. But denies falling or losing consciousness. She reports accompanying numbness and tingling around her mouth and up the right side of her face to her eye, describing it as a pulling feeling  Clinical Impression  Pt now c/o increasing HA and increasing dizziness when PT entered room.  Pt reports room not spinning but constant dizziness feeling with numbness in lips and twitching motion in lips.  Performed the following test:  Smooth pursuit + left side nystagmus, Head thrust + L/R with 10/10 dizziness, saccade -. No signs of BPPV noted however symptoms  possible peripheral vestibular impairment vs migraine.  Unable to r/o central vestibular impairment and will need MRI to r/o.  Difficult to assess due to pt increase dizziness limiting overall assessment.  However due recommend ENT f/u as well as OPPT for further assessment. Kelyse benefit from neurologist f/u if symptoms continue depending on MD's recommendations.     PT Assessment  Patient needs continued PT services    Follow Up Recommendations  Outpatient PT;Supervision - Intermittent, ENT f/u    Does the patient have the potential to tolerate intense rehabilitation       Equipment Recommendations  None recommended by PT    Frequency Min 3X/week    Precautions / Restrictions Precautions Precautions: None   Pertinent Vitals/Pain No c/o pain;       Mobility  Bed Mobility Bed Mobility: Supine to Sit Rolling Right: 6: Modified independent (Device/Increase time);With rail Right Sidelying to Sit: 6: Modified independent (Device/Increase time);With rails Sit to Sidelying Right: 6: Modified independent (Device/Increase time);With rail Details for Bed Mobility Assistance: Cues for visual fixation with turning Transfers Transfers: Sit to Stand;Stand to Sit Sit to Stand: 4: Min guard;From bed Stand to Sit: 4: Min guard;To bed Details for Transfer Assistance: Minguard for safety Ambulation/Gait Ambulation/Gait Assistance: 4: Min guard Ambulation Distance (Feet): 20 Feet Assistive device: None Ambulation/Gait Assistance Details: Minguard for safety due to dizziness Gait Pattern: Step-through pattern;Shuffle;Wide base of support Gait velocity: decreased General Gait Details: guarded gait Stairs: No    Exercises     PT Diagnosis: Difficulty walking  PT Problem List: Decreased activity tolerance;Decreased balance;Decreased mobility PT Treatment Interventions: Gait training;Functional mobility training;Therapeutic activities;Therapeutic exercise;Balance training;Neuromuscular re-education     PT Goals(Current goals can be found in the care plan section) Acute Rehab PT Goals Patient Stated Goal: To figure out what's wrong with me PT Goal Formulation: With patient Time For Goal Achievement: 06/25/13 Potential to Achieve Goals: Good  Visit Information  Last PT Received On: 06/18/13 Assistance Needed: +1 History of Present Illness:   Mrs. Searing is a 67 year old female with a PMH of DM and HTN who presents with dizziness and headache for 1 day. She reports acute onset of  dizziness yesterday while sitting, that has been constant and worsening in  severity. She describes the dizziness as lightheaded, experiencing a "floating" sensation, but denies room spinning. The dizziness worsens upon standing and walking around but is not relieved by anything, including sitting or meclazine. On walking to the bathroom this morning, she felt unsteady and had to brace herself due to the dizziness. But denies falling or losing consciousness. She reports accompanying numbness and tingling around her mouth and up the right side of her face to her eye, describing it as a pulling feeling       Prior Functioning  Home Living Family/patient expects to be discharged to:: Private residence Living Arrangements: Spouse/significant other;Children Available Help at Discharge: Family;Available PRN/intermittently Type of Home: House Home Access: Stairs to enter [employer] of Steps: 2 Entrance Stairs-Rails: None Home Layout: One level Home Equipment: None Prior Function Level of Independence: Independent    Cognition  Cognition Arousal/Alertness: Awake/alert Behavior During Therapy: WFL for tasks assessed/performed Overall Cognitive Status: Within Functional Limits for tasks assessed    Extremity/Trunk Assessment     Balance    End of Session PT - End of Session Equipment Utilized During Treatment: Gait belt Activity Tolerance: Other (comment) (Limited by dizziness) Patient left: in bed;with call bell/phone within reach Nurse Communication: Mobility status  GP Functional Limitation: Mobility: Walking and moving around Mobility: Walking and Moving Around Current Status [phone]): At least 20 percent but less than 40 percent impaired, limited or restricted Mobility: Walking and Moving Around Goal Status [phone]): At least 1 percent but less than 20 percent impaired, limited or restricted   Tamica Covell 06/18/2013, 2:54 PM  [place], PT DPT [phone]

## 2013-06-18 NOTE — Progress Notes (Signed)
Subjective:  Overnight [name] remained stable. This morning she reports feeling much better. Severity of the dizziness and headache have improved, although she continues to experience mild symptoms upon sitting or standing up from laying. She reports resolution of the numbness and tingling in her face and feet. She denies any nausea, vomiting or diarrhea and reports normal appetitie. She was reassured that White tests came back normal, and is ready for discharge today. To note, PT will conduct a vestibular eval before discharge.   Objective: Vital signs in last 24 hours: Filed Vitals:   06/18/13 0400 06/18/13 0449 06/18/13 0500 06/18/13 0600  BP: 112/57 133/60 124/71 116/60  Pulse: 66  56 53  Temp:   97.8 F (36.6 C)   TempSrc:   Oral   Resp:   20   Height:      Weight:      SpO2:   97%    Weight change:   Intake/Output Summary (Last 24 hours) at 06/18/13 1048 Last data filed at 06/18/13 1011  Gross per 24 hour  Intake      3 ml  Output      0 ml  Net      3 ml   Physical Exam: General: well appearing, alert and engaged, in no acute distress HEENT: pupils equal, round and reactive to light, vision grossly intact, moist mucous membranes  Heart: regular rate and rhythm, no gallops or rubs appreciated  Lungs: good respiratory effort, lungs clear to ascultation bilaterally,no wheezes, rales or rhonchi  Abdomen: soft, non-tender abdomen, normal active bowel sounds, no rebound or guarding  Extremities: no peripheral edema bilaterally, 2+ bilateral pulses Neuro: alert and oriented x3, cranial nerves II-XII grossly intact  Lab Results: @LABTEST2 @ Micro Results: No results found for this or any previous visit (from the past 240 hour(s)). Studies/Results: Ct Head Wo Contrast  06/17/2013   *RADIOLOGY REPORT*  Clinical Data: Dizziness and headache.  Numbness surround the mouth.  Left facial droop  CT HEAD WITHOUT CONTRAST  Technique:  Contiguous axial images were obtained from the  base of the skull through the vertex without contrast.  Comparison: 11/07/2011  Findings: There is no mass effect, midline shift, or acute intracranial hemorrhage.  Brain parenchyma and ventricles system is within normal limits for age.  Visualized paranasal sinuses and mastoid air cells are clear.  IMPRESSION: No acute intracranial pathology.  Negative head CT.   Original Report Authenticated By: Jolaine Click, M.D.   Medications: I have reviewed the patient's current medications. Scheduled Meds: . atorvastatin  20 mg Oral Daily  . citalopram  10 mg Oral Daily  . enoxaparin (LOVENOX) injection  40 mg Subcutaneous Q24H  . insulin aspart  0-9 Units Subcutaneous TID WC  . sodium chloride  3 mL Intravenous Q12H   Continuous Infusions:  PRN Meds:.acetaminophen, acetaminophen, LORazepam, meclizine, naproxen, ondansetron (ZOFRAN) IV, ondansetron Assessment/Plan:  [name] is a 67 year old female with a PMH of DM and HTN who presents with dizziness and headache for 1 day with accompanying numbness and tingling in her face and feet.   #Dizziness and headache   Patient presents with dizziness, described as lightheadedness that worsens upon standing and walking. Dizziness most consistent with orthostatic hypotension due to volume depletion in the context of several days of diarrhea following restarting routine metformin. Orthostatic vitals were negative yesterday and this morning and symptoms have significantly improved, following bolus and then continued IV fluids overnight. Anxiety is also likely contributing to the presentation,  as the patient appears overly anxious about a stroke. To note, during previous episodes of similar symptoms, the patient has experienced subjective relief with ativan. Given anxiety, patient will be started on an SSRI.    BPPV was also considered due to documented history of vertigo, however it is less likely as the patient is not experiencing sensation of room spinning, dix  Hallpike maneuver was negative (failed to reproduce symptoms or nystagmus), and the dizziness is not relieved by meclizine. PT will conduct evaluation vestibular evaluation before discharge.TIA and ischemic stroke were considered, but ultimately a stoke workup was not initiated following a negative CT scan, and history of 2 negative MRI scans with similar presentation of dizziness in the past two years. The dizziness is positional in nature with no accompanying focal numbness or weakness. The facial numbness and tingling is less concerning due to similar presentation previously. Medication side effect is considered as the patient is taking cyclobenzaprine PRN for back pain, however less likely as the patient does not report use in the past several days.   - Discharge today  - PT/OT vestbular evaluation before discharge - Start citalopram 10mg  Qday for anxiety, follow up with PCP - Continue meclazine PRN  - Discontinue cyclobenzaprine at discharge  # Diarrhea  Patient experienced 3 days of nausea and diarrhea prior to onset of symptoms, that has since resolved. Diarrhea most likely due to restarting metformin but gastroenteritis is also a likely cause.   #Diabetes  Patient reports elevated blood sugars lately, for which she has restarted taking her metformin. She reports new numbness and tingling in her feet bilaterally consistent with diabetic neuropathy. Hemoglobin A1C was last checked in 2010. Recorded at 6.1%.Current HbA1c pending.  - Discontinue home metformin which was the likely cause of symptoms due to diarrhea and volume depletion - Continue statin - HgA1c follow up with PCP. If >7 consider glipizide, otherwise diabetes well controlled without medications   #HTN  Patient has history of hypertension and is maintaining normotensive blood pressures in the 120s. Dizziness has improved and patient no longer orthostatic. - Continue home amlodipine and lorsartan-HCTZ   This is a Ad[employer] executive Note.  The care of the patient was discussed with Dr. Aundria Rud and the assessment and plan formulated with their assistance.  Please see their attached note for official documentation of the daily encounter.   LOS: 1 day   [name], Med Student 06/18/2013, 10:48 AM

## 2013-06-18 NOTE — H&P (Signed)
I saw and evaluated the patient. I reviewed the resident's note and confirmed the resident's findings.  I agree with the assessment and plan as documented in the resident's note.  [name] was admitted with orthostatic dizziness after 3 days of diarrhea.  She received IVFs before orthostatic vitals were taken and this Walter have lead to a lack of significant change in the values, but on examination and by history her symptoms were clearly orthostatic.  She is very anxious about having a stroke and has had 2 similar presentations which lead to MRI scans that ruled out a stroke.  Her symptoms are markedly improved this morning after IV hydration.  I agree with discharge home today and the initiation of an SSRI for anxiety.  Follow-up will be with her Primary Care Provider.

## 2013-06-18 NOTE — H&P (Signed)
Medical Service: Internal Medicine Teaching Service     I have reviewed the note by Roxanne Mins and was present during the interview and physical exam.  Please see my separate note for full details.   Signed: Rocco Serene, MD 06/18/2013, 3:20 PM

## 2013-06-18 NOTE — Progress Notes (Signed)
   I have seen the patient and reviewed the daily progress note by Roxanne Mins and discussed the care of the patient with them.  See my separate note for full details.   Rocco Serene, MD 06/18/2013, 3:21 PM

## 2013-06-18 NOTE — Progress Notes (Signed)
Subjective: [name] is doing quite well this morning.  She states that her headache is gone and her dizziness is much decreased.  She is only experiencing dizziness when she stands, and it is much milder than yesterday.    Objective: Vital signs in last 24 hours: Filed Vitals:   06/18/13 0400 06/18/13 0449 06/18/13 0500 06/18/13 0600  BP: 112/57 133/60 124/71 116/60  Pulse: 66  56 53  Temp:   97.8 F (36.6 C)   TempSrc:   Oral   Resp:   20   Height:      Weight:      SpO2:   97%    Weight change:   Intake/Output Summary (Last 24 hours) at 06/18/13 1020 Last data filed at 06/18/13 1011  Gross per 24 hour  Intake      3 ml  Output      0 ml  Net      3 ml   PEX General: alert, cooperative, and in no apparent distress HEENT: vision grossly intact, oropharynx clear and non-erythematous  Neck: supple, no lymphadenopathy, JVD, or carotid bruits Lungs: clear to ascultation bilaterally, normal work of respiration, no wheezes, rales, ronchi Heart: regular rate and rhythm, no murmurs, gallops, or rubs Abdomen: soft, non-tender, non-distended, normal bowel sounds Extremities: no cyanosis, clubbing, or edema Neurologic: alert & oriented X3, cranial nerves II-XII intact, strength grossly intact, sensation intact to light touch   Lab Results: Basic Metabolic Panel:  Recent Labs Lab 06/17/13 1309 06/17/13 1310 06/18/13 0615  NA 137 140 138  K 4.1 4.0 3.6  CL 99 103 105  CO2 27  --  27  GLUCOSE 139* 142* 117*  BUN 19 19 19   CREATININE 1.05 1.30* 0.95  CALCIUM 10.0  --  8.8   Liver Function Tests:  Recent Labs Lab 06/17/13 1309  AST 20  ALT 11  ALKPHOS 130*  BILITOT 0.4  PROT 7.8  ALBUMIN 3.9   CBC:  Recent Labs Lab 06/17/13 1309 06/17/13 1310  WBC 8.6  --   NEUTROABS 5.1  --   HGB 14.3 15.0  HCT 40.7 44.0  MCV 88.5  --   PLT 374  --    CBG:  Recent Labs Lab 06/17/13 1218 06/17/13 1639 06/17/13 2002 06/18/13 0755  GLUCAP 131* 106* 181* 114*     Coagulation:  Recent Labs Lab 06/17/13 1309  LABPROT 12.8  INR 0.98   Urinalysis:  Recent Labs Lab 06/17/13 1318  COLORURINE AMBER*  LABSPEC 1.024  PHURINE 5.5  GLUCOSEU NEGATIVE  HGBUR SMALL*  BILIRUBINUR SMALL*  KETONESUR NEGATIVE  PROTEINUR 30*  UROBILINOGEN 0.2  NITRITE NEGATIVE  LEUKOCYTESUR SMALL*   Studies/Results: Ct Head Wo Contrast  06/17/2013   *RADIOLOGY REPORT*  Clinical Data: Dizziness and headache.  Numbness surround the mouth.  Left facial droop  CT HEAD WITHOUT CONTRAST  Technique:  Contiguous axial images were obtained from the base of the skull through the vertex without contrast.  Comparison: 11/07/2011  Findings: There is no mass effect, midline shift, or acute intracranial hemorrhage.  Brain parenchyma and ventricles system is within normal limits for age.  Visualized paranasal sinuses and mastoid air cells are clear.  IMPRESSION: No acute intracranial pathology.  Negative head CT.   Original Report Authenticated By: Jolaine Click, M.D.   Medications: I have reviewed the patient's current medications. Scheduled Meds: . atorvastatin  20 mg Oral Daily  . enoxaparin (LOVENOX) injection  40 mg Subcutaneous Q24H  . insulin  aspart  0-9 Units Subcutaneous TID WC  . sodium chloride  3 mL Intravenous Q12H   Continuous Infusions: . sodium chloride 100 mL/hr at 06/18/13 1610   PRN Meds:.acetaminophen, acetaminophen, LORazepam, meclizine, naproxen, ondansetron (ZOFRAN) IV, ondansetron Assessment/Plan: [name] is a 67 y/o woman with DM2 and HTN who was admitted for dizziness, likely due to orthostatic hypotension.  # Dizziness- likely 2/2 orthostatic hypotension. Orthostatics yesterday afternoon and this morning have both been negative; however, pt had already received fluid bolus prior to first set of orthostatic vitals and has been receiving continuous fluids overnight prior to second set. Given pt's anxiety about her symptoms being stroke-related after  multiple negative work-ups for stroke with similar symtoms in addition to pt's subjective decrease in dizziness with lorazepam (now twice- this admission and last), will start SSRI.  Will have PT see pt for vestibular eval to rule out BPPV, but this is unlikely given negative Dix Hallpike, no response to meclazine, and pt's gradually resolving dizziness with IVF.  -discontinue telemetry -continue meclazine -start citalopram 10 mg daily, follow-up as outpatient -awaiting PT vestibular eval before discharge -discontinue cyclobenzaprine at discharge  #DM2, controlled- last A1C 6.1% in 2/10, A1C this admission pending.  Pt takes metformin at home but does not take it consistently.  When she restarted it, caused 3 days of diarrhea which likely led to volume depletion and caused her dizziness; therefore, will discontinue this mediation.   -continue statin -awaiting A1C; if >7, will start pt on glipizide (if <7, will discharge on no diabetes medications) -discontinue metformin at discharge -follow-up as outpatient  #HTN- Pt has been normotensive this admission but will restart her home meds now since her dizziness is improved, and she does not have orthostatic hypotension as measured today.  -re-start amlodipine and losartan-hctz at discharge  Dispo: Disposition today.   The patient does have a current PCP [name], MD) and does not need an [facility] hospital follow-up appointment after discharge.   .Services Needed at time of discharge: Y = Yes, Blank = No PT:   OT:   RN:   Equipment:   Other:     LOS: 1 day   Rocco Serene, MD 06/18/2013, 10:20 AM

## 2013-07-27 ENCOUNTER — Emergency Department (HOSPITAL_COMMUNITY)
Admission: EM | Admit: 2013-07-27 | Discharge: 2013-07-27 | Disposition: A | Discharge status: Home / Self Care | Payer: Medicare Other | Attending: Emergency Medicine | Admitting: Emergency Medicine

## 2013-07-27 ENCOUNTER — Emergency Department (HOSPITAL_COMMUNITY): Payer: Medicare Other

## 2013-07-27 DIAGNOSIS — G8929 Other chronic pain: Secondary | ICD-10-CM | POA: Insufficient documentation

## 2013-07-27 DIAGNOSIS — M129 Arthropathy, unspecified: Secondary | ICD-10-CM | POA: Insufficient documentation

## 2013-07-27 DIAGNOSIS — Z79899 Other long term (current) drug therapy: Secondary | ICD-10-CM | POA: Insufficient documentation

## 2013-07-27 DIAGNOSIS — R300 Dysuria: Secondary | ICD-10-CM | POA: Insufficient documentation

## 2013-07-27 DIAGNOSIS — E119 Type 2 diabetes mellitus without complications: Secondary | ICD-10-CM | POA: Insufficient documentation

## 2013-07-27 DIAGNOSIS — I10 Essential (primary) hypertension: Secondary | ICD-10-CM | POA: Insufficient documentation

## 2013-07-27 DIAGNOSIS — Z8719 Personal history of other diseases of the digestive system: Secondary | ICD-10-CM | POA: Insufficient documentation

## 2013-07-27 DIAGNOSIS — N898 Other specified noninflammatory disorders of vagina: Secondary | ICD-10-CM

## 2013-07-27 DIAGNOSIS — N390 Urinary tract infection, site not specified: Secondary | ICD-10-CM

## 2013-07-27 DIAGNOSIS — N899 Noninflammatory disorder of vagina, unspecified: Secondary | ICD-10-CM | POA: Insufficient documentation

## 2013-07-27 DIAGNOSIS — F17200 Nicotine dependence, unspecified, uncomplicated: Secondary | ICD-10-CM | POA: Insufficient documentation

## 2013-07-27 LAB — WET PREP, GENITAL
Clue Cells Wet Prep HPF POC: NONE SEEN
Trich, Wet Prep: NONE SEEN
WBC, Wet Prep HPF POC: NONE SEEN
Yeast Wet Prep HPF POC: NONE SEEN

## 2013-07-27 LAB — CBC WITH DIFFERENTIAL/PLATELET
Basophils Relative: 1 % (ref 0–1)
Eosinophils Absolute: 0.6 10*3/uL (ref 0.0–0.7)
MCH: 30.4 pg (ref 26.0–34.0)
MCHC: 34.5 g/dL (ref 30.0–36.0)
Neutrophils Relative %: 51 % (ref 43–77)
Platelets: 344 10*3/uL (ref 150–400)

## 2013-07-27 LAB — BASIC METABOLIC PANEL
BUN: 12 mg/dL (ref 6–23)
CO2: 28 mEq/L (ref 19–32)
Calcium: 9.8 mg/dL (ref 8.4–10.5)
GFR calc non Af Amer: 53 mL/min — ABNORMAL LOW (ref >90)
Glucose, Bld: 144 mg/dL — ABNORMAL HIGH (ref 70–99)
Potassium: 3.8 mEq/L (ref 3.5–5.1)

## 2013-07-27 LAB — URINALYSIS, ROUTINE W REFLEX MICROSCOPIC
Glucose, UA: NEGATIVE mg/dL
Specific Gravity, Urine: 1.015 (ref 1.005–1.030)

## 2013-07-27 LAB — URINE MICROSCOPIC-ADD ON

## 2013-07-27 MED ORDER — PHENAZOPYRIDINE HCL 200 MG PO TABS: 200.0000 mg | ORAL_TABLET | Freq: Three times a day (TID) | ORAL | Status: DC

## 2013-07-27 MED ORDER — HYDROCODONE-ACETAMINOPHEN 5-325 MG PO TABS: 2.0000 | ORAL_TABLET | ORAL | Status: DC | PRN

## 2013-07-27 MED ORDER — FLUCONAZOLE 100 MG PO TABS
150.0000 mg | ORAL_TABLET | Freq: Once | ORAL | Status: AC
  Administered 2013-07-27: 150 mg via ORAL
  Filled 2013-07-27: qty 2

## 2013-07-27 NOTE — ED Notes (Signed)
Family at bedside. 

## 2013-07-27 NOTE — ED Notes (Signed)
Per pt states that she has been having vaginal burning and pelvic pain.  She reports that she was dx'd with a UTI yesterday by Dr. Algie Coffer.  She reports that she has been taking her antibiotics as prescribed.

## 2013-07-27 NOTE — ED Provider Notes (Signed)
CSN: [id number]     Arrival date & time 07/27/13  1308 History   First MD Initiated Contact with Patient 07/27/13 [phone]     Chief Complaint  Patient presents with  . Vaginal Pain   (Consider location/radiation/quality/duration/timing/severity/associated sxs/prior Treatment) HPI Comments: Patient presents with vaginal and urinary pain for the past 2 months. She's been treated multiple times for UTIs and was started on Macrodantin yesterday by Dr. could not get in. As she says this is her fourth antibiotic for UTI. She denies any nausea, vomiting or fever. She has suprapubic tenderness. Good by mouth intake. She denies any vaginal bleeding or vaginal discharge. She's had a partial hysterectomy. She states she is sexually active on occasion. Denies any back pain, chest pain, shortness of breath, fever chills. Nothing makes the pain better or worse. She denies any rashes.  The history is provided by the patient.    Past Medical History  Diagnosis Date  . Diabetes mellitus   . Hypertension   . GERD (gastroesophageal reflux disease)   . Herniated disc   . Arthritis   . Shortness of breath on exertion 11/10/11    "sometimes"  . H/O hiatal hernia   . Headache(784.0)   . Hemorrhoids   . Chronic back pain greater than 3 months duration    Past Surgical History  Procedure Laterality Date  . Knee surgery      right  . Shoulder open rotator cuff repair  07/20/2011    left  . Fracture surgery  2004?    right shoulder  . Knee arthroscopy  2004    right  . Tonsillectomy    . Cholecystectomy  ~ 2008  . Abdominal hysterectomy  1997?    "partial"  . Neck fusion     No family history on file. History  Substance Use Topics  . Smoking status: Current Every Day Smoker -- 0.20 packs/day for 40 years    Types: Cigarettes  . Smokeless tobacco: Never Used     Comment: smoking cessation consult entered  . Alcohol Use: No   OB History   Grav Para Term Preterm Abortions TAB SAB Ect Mult Living                  Review of Systems  Constitutional: Negative for activity change and appetite change.  HENT: Negative for congestion and rhinorrhea.   Respiratory: Negative for cough, chest tightness and shortness of breath.   Cardiovascular: Negative for chest pain.  Gastrointestinal: Negative for nausea, vomiting and abdominal pain.  Genitourinary: Positive for dysuria and vaginal pain. Negative for hematuria, flank pain, vaginal bleeding, vaginal discharge and genital sores.  Musculoskeletal: Negative for back pain.  Neurological: Negative for dizziness, weakness and headaches.  A complete 10 system review of systems was obtained and all systems are negative except as noted in the HPI and PMH.    Allergies  Hydrocodone and Percocet  Home Medications   Current Outpatient Rx  Name  Route  Sig  Dispense  Refill  . amLODipine (NORVASC) 5 MG tablet   Oral   Take 5 mg by mouth daily.         . cholecalciferol (VITAMIN D) 1000 UNITS tablet   Oral   Take 1,000 Units by mouth daily.         Marland Kitchen losartan-hydrochlorothiazide (HYZAAR) 100-12.5 MG per tablet   Oral   Take 1 tablet by mouth daily.           . meclizine (  ANTIVERT) 12.5 MG tablet   Oral   Take 12.5 mg by mouth 3 (three) times daily as needed for dizziness.          . nitrofurantoin (MACRODANTIN) 100 MG capsule   Oral   Take 100 mg by mouth 2 (two) times daily. On 7 day course for UTI, started on 07-26-13.         Marland Kitchen potassium chloride (KLOR-CON) 10 MEQ CR tablet   Oral   Take 20 mEq by mouth daily.          . rosuvastatin (CRESTOR) 10 MG tablet   Oral   Take 10 mg by mouth daily.         Marland Kitchen HYDROcodone-acetaminophen (NORCO/VICODIN) 5-325 MG per tablet   Oral   Take 2 tablets by mouth every 4 (four) hours as needed for pain.   10 tablet   0   . phenazopyridine (PYRIDIUM) 200 MG tablet   Oral   Take 1 tablet (200 mg total) by mouth 3 (three) times daily.   6 tablet   0    BP 121/60  Pulse 63   Temp(Src) 98.8 F (37.1 C) (Oral)  Resp 17  SpO2 100% Physical Exam  Vitals reviewed. Constitutional: She is oriented to person, place, and time. She appears well-developed and well-nourished. No distress.  HENT:  Head: Normocephalic and atraumatic.  Mouth/Throat: Oropharynx is clear and moist. No oropharyngeal exudate.  Eyes: Conjunctivae and EOM are normal. Pupils are equal, round, and reactive to light.  Neck: Normal range of motion. Neck supple.  Cardiovascular: Normal rate, regular rhythm and normal heart sounds.   No murmur heard. Pulmonary/Chest: Effort normal and breath sounds normal. No respiratory distress.  Abdominal: Soft. There is no tenderness. There is no rebound and no guarding.  Genitourinary: Vaginal discharge found.  Normal external genitalia, no rashes.  Copious white discharge, cervix absent.  No lateralizing adnexal tenderness Chaperone present  Musculoskeletal: Normal range of motion. She exhibits no edema and no tenderness.  5/5 strength in bilateral lower extremities. Ankle plantar and dorsiflexion intact. Great toe extension intact bilaterally. +2 DP and PT pulses. +2 patellar reflexes bilaterally. Normal gait.   Neurological: She is alert and oriented to person, place, and time. No cranial nerve deficit. She exhibits normal muscle tone. Coordination normal.  Skin: Skin is warm.    ED Course  Procedures (including critical care time) Labs Review Labs Reviewed  URINALYSIS, ROUTINE W REFLEX MICROSCOPIC - Abnormal; Notable for the following:    Hgb urine dipstick SMALL (*)    Nitrite POSITIVE (*)    All other components within normal limits  CBC WITH DIFFERENTIAL - Abnormal; Notable for the following:    Eosinophils Relative 7 (*)    All other components within normal limits  BASIC METABOLIC PANEL - Abnormal; Notable for the following:    Glucose, Bld 144 (*)    GFR calc non Af Amer 53 (*)    GFR calc Af Amer 62 (*)    All other components within normal  limits  WET PREP, GENITAL  GC/CHLAMYDIA PROBE AMP  URINE CULTURE  URINE MICROSCOPIC-ADD ON   Imaging Review US Transvaginal Non-ob  07/27/2013   *RADIOLOGY REPORT*  Clinical Data: Pelvic pain  TRANSABDOMINAL AND TRANSVAGINAL ULTRASOUND OF PELVIS Technique:  Both transabdominal and transvaginal ultrasound examinations of the pelvis were performed. Transabdominal technique was performed for global imaging of the pelvis including uterus, ovaries, adnexal regions, and pelvic cul-de-sac.  It was necessary to proceed  with endovaginal exam following the transabdominal exam to visualize the left ovary, right ovary.  Comparison:  The CT 07/13/2011  Findings:  Uterus: Surgically removed  Endometrium: Surgically removed  Right ovary:  Normal in size at 2.0 x 1.4 x 0.9 cm.  There is a 1.3 cm anechoic cyst associated with the right ovary.  Left ovary: Not identified transvaginally or transabdominally.  Other findings: No free fluid  IMPRESSION:  1.  Post hysterectomy. 2.  Normal right ovary. 3.  Left ovary not visualized. 4.  No acute findings in the pelvis. 5.  Small cyst associated with the right ovary.   This is almost certainly benign, but follow up ultrasound is recommended in 1 year according to the Society of Radiologists in Ultrasound 2010 Consensus Conference Statement (D Lenis Noon et al.  Management of Asymptomatic Ovarian and Other Adnexal Cysts Imaged at [place]:  Society of Radiologists in Ultrasound Consensus Conference Statement 2010. Radiology 256 (Sept 2010):  943-954.).   Original Report Authenticated By: Genevive Bi, M.D.   US Pelvis Complete  07/27/2013   *RADIOLOGY REPORT*  Clinical Data: Pelvic pain  TRANSABDOMINAL AND TRANSVAGINAL ULTRASOUND OF PELVIS Technique:  Both transabdominal and transvaginal ultrasound examinations of the pelvis were performed. Transabdominal technique was performed for global imaging of the pelvis including uterus, ovaries, adnexal regions, and pelvic cul-de-sac.  It was  necessary to proceed with endovaginal exam following the transabdominal exam to visualize the left ovary, right ovary.  Comparison:  The CT 07/13/2011  Findings:  Uterus: Surgically removed  Endometrium: Surgically removed  Right ovary:  Normal in size at 2.0 x 1.4 x 0.9 cm.  There is a 1.3 cm anechoic cyst associated with the right ovary.  Left ovary: Not identified transvaginally or transabdominally.  Other findings: No free fluid  IMPRESSION:  1.  Post hysterectomy. 2.  Normal right ovary. 3.  Left ovary not visualized. 4.  No acute findings in the pelvis. 5.  Small cyst associated with the right ovary.   This is almost certainly benign, but follow up ultrasound is recommended in 1 year according to the Society of Radiologists in Ultrasound 2010 Consensus Conference Statement (D Lenis Noon et al.  Management of Asymptomatic Ovarian and Other Adnexal Cysts Imaged at [place]:  Society of Radiologists in Ultrasound Consensus Conference Statement 2010. Radiology 256 (Sept 2010):  [phone].).   Original Report Authenticated By: Genevive Bi, M.D.    MDM   1. Vaginal irritation   2. Urinary tract infection    2 months of vaginal burning and dysuria. Multiple rounds of antibiotics for UTI. No fevers or vomiting.  UA shows positive nitrate.  Patient currently on macrobid.  Will send culture.  Pelvic exam is benign.  No rashes or lesions. Patient informed of right ovarian cyst which is likely not causing her symptoms. Needs followup ultrasound one year.  Will refer to gynecology. She will be given Pyridium for symptom control  Glynn Octave, MD 07/27/13 1228

## 2013-07-28 LAB — GC/CHLAMYDIA PROBE AMP: CT Probe RNA: NEGATIVE

## 2013-07-28 LAB — URINE CULTURE: Culture: NO GROWTH

## 2013-09-07 ENCOUNTER — Encounter: Payer: Medicare Other | Admitting: Family Medicine

## 2014-01-31 ENCOUNTER — Encounter (HOSPITAL_COMMUNITY): Payer: Self-pay | Admitting: Emergency Medicine

## 2014-01-31 ENCOUNTER — Emergency Department (HOSPITAL_COMMUNITY)
Admission: EM | Admit: 2014-01-31 | Discharge: 2014-01-31 | Disposition: A | Discharge status: Home / Self Care | Payer: Medicare Other | Attending: Emergency Medicine | Admitting: Emergency Medicine

## 2014-01-31 DIAGNOSIS — I10 Essential (primary) hypertension: Secondary | ICD-10-CM | POA: Insufficient documentation

## 2014-01-31 DIAGNOSIS — E119 Type 2 diabetes mellitus without complications: Secondary | ICD-10-CM | POA: Insufficient documentation

## 2014-01-31 DIAGNOSIS — G8929 Other chronic pain: Secondary | ICD-10-CM | POA: Insufficient documentation

## 2014-01-31 DIAGNOSIS — R682 Dry mouth, unspecified: Secondary | ICD-10-CM

## 2014-01-31 DIAGNOSIS — Z8719 Personal history of other diseases of the digestive system: Secondary | ICD-10-CM | POA: Insufficient documentation

## 2014-01-31 DIAGNOSIS — Z79899 Other long term (current) drug therapy: Secondary | ICD-10-CM | POA: Insufficient documentation

## 2014-01-31 DIAGNOSIS — K117 Disturbances of salivary secretion: Secondary | ICD-10-CM | POA: Insufficient documentation

## 2014-01-31 DIAGNOSIS — F17200 Nicotine dependence, unspecified, uncomplicated: Secondary | ICD-10-CM | POA: Insufficient documentation

## 2014-01-31 DIAGNOSIS — R42 Dizziness and giddiness: Secondary | ICD-10-CM | POA: Insufficient documentation

## 2014-01-31 DIAGNOSIS — Z8739 Personal history of other diseases of the musculoskeletal system and connective tissue: Secondary | ICD-10-CM | POA: Insufficient documentation

## 2014-01-31 NOTE — ED Notes (Signed)
Pt. Stated, i've had a dry mouth for 4 weeks.  i went to UC at [facility] and they said i had the thrush and gave me some medicine but it no better. Tongue feels swollen on the left side.

## 2014-01-31 NOTE — ED Provider Notes (Signed)
CSN: [id number]     Arrival date & time 01/31/14  1550 History  This chart was scribed for non-physician practitioner, Sherrie George, PA-C working with Dot Lanes, MD by Frederich Balding, ED scribe. This patient was seen in room TR08C/TR08C and the patient's care was started at 7:51 PM.   Chief Complaint  Patient presents with  . Dry mouth    The history is provided by the patient. No language interpreter was used.   HPI Comments: [name] is a 68 y.o. female with history of diabetes and hypertension who presents to the Emergency Department complaining of dry tongue that started about 4 weeks ago. She states her tongue is mildly swollen and it has started to get black underneath. Pt was seen for the same 3 weeks ago at an Urgent Care and given a mouth wash and nystatin pills for Thrush. She finished the pills yesterday. She has also had intermittent congestion and mild sore throat that started around the same time as her other symptoms. Pt has not taken any OTC medications for her symptoms. She has smoked cigarettes for 30 years and is down to 2 cigarettes per day. Pt was diagnosed with diabetes in 2007. She states her sugars have been good. Reports her sugar earlier today was around 107.  Past Medical History  Diagnosis Date  . Diabetes mellitus   . Hypertension   . GERD (gastroesophageal reflux disease)   . Herniated disc   . Arthritis   . Shortness of breath on exertion 11/10/11    "sometimes"  . H/O hiatal hernia   . Headache(784.0)   . Hemorrhoids   . Chronic back pain greater than 3 months duration    Past Surgical History  Procedure Laterality Date  . Knee surgery      right  . Shoulder open rotator cuff repair  07/20/2011    left  . Fracture surgery  2004?    right shoulder  . Knee arthroscopy  2004    right  . Tonsillectomy    . Cholecystectomy  ~ 2008  . Abdominal hysterectomy  1997?    "partial"  . Neck fusion     History reviewed. No pertinent family  history. History  Substance Use Topics  . Smoking status: Current Every Day Smoker -- 0.20 packs/day for 40 years    Types: Cigarettes  . Smokeless tobacco: Never Used     Comment: smoking cessation consult entered  . Alcohol Use: No   OB History   Grav Para Term Preterm Abortions TAB SAB Ect Mult Living                 Review of Systems  All other systems reviewed and are negative.   Allergies  Hydrocodone and Percocet  Home Medications   Current Outpatient Rx  Name  Route  Sig  Dispense  Refill  . amLODipine (NORVASC) 5 MG tablet   Oral   Take 5 mg by mouth daily.         . cholecalciferol (VITAMIN D) 1000 UNITS tablet   Oral   Take 1,000 Units by mouth daily.         Marland Kitchen losartan-hydrochlorothiazide (HYZAAR) 100-12.5 MG per tablet   Oral   Take 1 tablet by mouth daily.           . meclizine (ANTIVERT) 12.5 MG tablet   Oral   Take 12.5 mg by mouth 3 (three) times daily as needed for dizziness.          Marland Kitchen  metFORMIN (GLUCOPHAGE) 500 MG tablet   Oral   Take 500 mg by mouth 2 (two) times daily.         . metoprolol tartrate (LOPRESSOR) 25 MG tablet   Oral   Take 25 mg by mouth 2 (two) times daily.         . potassium chloride (KLOR-CON) 10 MEQ CR tablet   Oral   Take 20 mEq by mouth daily.          . rosuvastatin (CRESTOR) 10 MG tablet   Oral   Take 10 mg by mouth daily.          BP 160/63  Pulse 70  Temp(Src) 98.5 F (36.9 C) (Oral)  Resp 20  Wt 225 lb (102.059 kg)  SpO2 100%  Physical Exam  Nursing note and vitals reviewed. Constitutional: She is oriented to person, place, and time. She appears well-developed and well-nourished. No distress.  HENT:  Head: Normocephalic and atraumatic.  Right Ear: Tympanic membrane and ear canal normal.  Left Ear: Tympanic membrane and ear canal normal.  Nose: Nose normal. Right sinus exhibits no maxillary sinus tenderness and no frontal sinus tenderness. Left sinus exhibits no maxillary sinus  tenderness and no frontal sinus tenderness.  Mouth/Throat: Uvula is midline, oropharynx is clear and moist and mucous membranes are normal. No oral lesions. No trismus in the jaw. No dental abscesses or uvula swelling. No oropharyngeal exudate, posterior oropharyngeal edema or posterior oropharyngeal erythema.  No white residue or evidence of thrush noted. Underneath area of tongue that patient complain is "black" is normal in color. Believe patient is referring the vasculature/blood vessels beneath the tongue that are deep purple in color.     Eyes: Conjunctivae are normal. Right eye exhibits no discharge. Left eye exhibits no discharge. No scleral icterus.  Neck: Phonation normal. Neck supple. No JVD present. No rigidity. No tracheal deviation, no edema and no erythema present.  Cardiovascular: Normal rate and regular rhythm.  Exam reveals no gallop and no friction rub.   No murmur heard. Pulmonary/Chest: Effort normal. No stridor. No respiratory distress. She has no wheezes. She has no rhonchi. She has no rales.  Musculoskeletal: Normal range of motion. She exhibits no edema.  Lymphadenopathy:    She has no cervical adenopathy.  Neurological: She is alert and oriented to person, place, and time.  Skin: Skin is warm and dry. She is not diaphoretic.  Psychiatric: She has a normal mood and affect. Her behavior is normal.    ED Course  Procedures (including critical care time)  DIAGNOSTIC STUDIES: Oxygen Saturation is 100% on RA, normal by my interpretation.    COORDINATION OF CARE: 8:00 PM-Discussed treatment plan which includes PCP follow up with pt at bedside and pt agreed to plan.   Labs Review Labs Reviewed - No data to display Imaging Review No results found.   EKG Interpretation None      MDM   Final diagnoses:  Dry mouth   Patient afebrile.  No evidence of thrush, pharyngitis, or oral lesions on exam.  Patient takes meclizine for vertigo symptoms. Discussed how  antihistamines can have a drying effect on the mucosa. Patient advised to drink plenty of water, and follow up with her PCP should sxs continue. Discharged in good condition.    I personally performed the services described in this documentation, which was scribed in my presence. The recorded information has been reviewed and is accurate.  Sherrie George, PA-C 02/03/14 1530

## 2014-01-31 NOTE — ED Notes (Signed)
Pt A&Ox4, ambulatory with steady gait at discharge, verbalizing no complaints at this time.

## 2014-01-31 NOTE — Discharge Instructions (Signed)
Sore or Dry Mouth Care A sore or dry mouth Mitzie happen for many different reasons. Sometimes, treatment for other health problems Jamillah have to stop until your sore or dry mouth gets better.  HOME CARE  Do not smoke or chew tobacco.  Use fake (artificial) saliva when your mouth feels dry.  Use a humidifier in your bedroom at night.  Eat small meals and snacks.  Eat food cold or at room temperature.  Suck on ice-chips or try frozen ice pops or juice bars, ice-cream, and watermelon. Do not have citrus flavors.  Suck on hard, sugarless, sour candy, or chew sugarless gum to help make more saliva.  Eat soft foods such as yogurt, bananas, canned fruit, mashed potatoes, oatmeal, rice, eggs, cottage cheese, macaroni and cheese, jello, and pudding.  Microwave vegetables and fruits to soften them.  Puree cooked food in a blender if needed.  Make dry food moist by using olive oil, gravy, or mild sauces. Dip foods in liquids.  Keep a glass of water or squirt bottle nearby. Take sips often throughout the day.  Limit caffeine.  Avoid:  Pop or fizzy drinks.  Alcohol.  Citrus juices.  Acidic food.  Salty or spicy food.  Foods or drinks that are very hot.  Hard or crunchy food. Mouth Care  Wash your hands well with soap and water before doing mouth care.  Use fake saliva as told by your doctor.  Use medicine on the sore places.  Brush your teeth at least 2 times a day. Brush after each meal if possible. Rinse your mouth with water after each meal and after drinking a sweet drink.  Brush slowly and gently in small circles. Do not brush side-to-side.  Use regular toothpastes, but stay away from ones that have sodium laurel sulfate in them.  Gargle with a baking soda mouthwash ( teaspoon baking soda mixed in with 4 cups of water).  Gargle with medicated mouthwash.  Use dental floss or dental tape to clean between your teeth every day.  Use a lanolin-based lip balm to keep  your lips from getting dry.  If you wear dentures or bridges:  You Leanette need to leave them out until your doctor tells you to start wearing them again.  Take them out at night if you wear them daily. Soak them in warm water or denture solution. Take your dentures out as much as you can during the day. Take them out when you use mouthwash.  After each meal, brush your gums gently with a soft brush and rinse your mouth with water.  If your dentures rub on your gums and cause a sore spot, have your dentist check and fix your dentures right away. GET HELP RIGHT AWAY IF:   Your mouth gets more painful or dry.  You have questions. MAKE SURE YOU:  Understand these instructions.  Will watch your condition.  Will get help right away if you are not doing well or get worse. Document Released: 09/06/2009 Document Revised: 02/01/2012 Document Reviewed: 09/06/2009 [facility] Patient Information 2014 [place], [place].

## 2014-01-31 NOTE — ED Notes (Signed)
Pt in c/o dry tongue and feels like it is turning black under her tongue, no visible changes noted, seen at urgent care for same three weeks ago without improvement

## 2014-02-11 ENCOUNTER — Emergency Department (HOSPITAL_COMMUNITY): Payer: Medicare Other

## 2014-02-11 ENCOUNTER — Emergency Department (HOSPITAL_COMMUNITY)
Admission: EM | Admit: 2014-02-11 | Discharge: 2014-02-11 | Disposition: A | Discharge status: Home / Self Care | Payer: Medicare Other | Attending: Emergency Medicine | Admitting: Emergency Medicine

## 2014-02-11 ENCOUNTER — Encounter (HOSPITAL_COMMUNITY): Payer: Self-pay | Admitting: Emergency Medicine

## 2014-02-11 DIAGNOSIS — F17200 Nicotine dependence, unspecified, uncomplicated: Secondary | ICD-10-CM | POA: Insufficient documentation

## 2014-02-11 DIAGNOSIS — M545 Low back pain, unspecified: Secondary | ICD-10-CM | POA: Insufficient documentation

## 2014-02-11 DIAGNOSIS — Z79899 Other long term (current) drug therapy: Secondary | ICD-10-CM | POA: Insufficient documentation

## 2014-02-11 DIAGNOSIS — Z8719 Personal history of other diseases of the digestive system: Secondary | ICD-10-CM | POA: Insufficient documentation

## 2014-02-11 DIAGNOSIS — G8929 Other chronic pain: Secondary | ICD-10-CM | POA: Insufficient documentation

## 2014-02-11 DIAGNOSIS — Z8739 Personal history of other diseases of the musculoskeletal system and connective tissue: Secondary | ICD-10-CM | POA: Insufficient documentation

## 2014-02-11 DIAGNOSIS — E119 Type 2 diabetes mellitus without complications: Secondary | ICD-10-CM | POA: Insufficient documentation

## 2014-02-11 DIAGNOSIS — I10 Essential (primary) hypertension: Secondary | ICD-10-CM | POA: Insufficient documentation

## 2014-02-11 DIAGNOSIS — M549 Dorsalgia, unspecified: Secondary | ICD-10-CM

## 2014-02-11 DIAGNOSIS — K219 Gastro-esophageal reflux disease without esophagitis: Secondary | ICD-10-CM | POA: Insufficient documentation

## 2014-02-11 LAB — CBC WITH DIFFERENTIAL/PLATELET
Basophils Absolute: 0 10*3/uL (ref 0.0–0.1)
Basophils Relative: 0 % (ref 0–1)
EOS ABS: 0.4 10*3/uL (ref 0.0–0.7)
EOS PCT: 5 % (ref 0–5)
HCT: 39.3 % (ref 36.0–46.0)
HEMOGLOBIN: 13.4 g/dL (ref 12.0–15.0)
LYMPHS ABS: 3.6 10*3/uL (ref 0.7–4.0)
Lymphocytes Relative: 38 % (ref 12–46)
MCH: 30.2 pg (ref 26.0–34.0)
MCHC: 34.1 g/dL (ref 30.0–36.0)
MCV: 88.5 fL (ref 78.0–100.0)
MONOS PCT: 5 % (ref 3–12)
Monocytes Absolute: 0.5 10*3/uL (ref 0.1–1.0)
Neutro Abs: 4.9 10*3/uL (ref 1.7–7.7)
Neutrophils Relative %: 52 % (ref 43–77)
PLATELETS: 357 10*3/uL (ref 150–400)
RBC: 4.44 MIL/uL (ref 3.87–5.11)
RDW: 12.9 % (ref 11.5–15.5)
WBC: 9.4 10*3/uL (ref 4.0–10.5)

## 2014-02-11 LAB — COMPREHENSIVE METABOLIC PANEL
ALT: 10 U/L (ref 0–35)
AST: 17 U/L (ref 0–37)
Albumin: 3.6 g/dL (ref 3.5–5.2)
Alkaline Phosphatase: 155 U/L — ABNORMAL HIGH (ref 39–117)
BUN: 12 mg/dL (ref 6–23)
CALCIUM: 9.6 mg/dL (ref 8.4–10.5)
CO2: 29 mEq/L (ref 19–32)
CREATININE: 0.95 mg/dL (ref 0.50–1.10)
Chloride: 95 mEq/L — ABNORMAL LOW (ref 96–112)
GFR calc non Af Amer: 61 mL/min — ABNORMAL LOW (ref >90)
GFR, EST AFRICAN AMERICAN: 71 mL/min — AB (ref >90)
GLUCOSE: 158 mg/dL — AB (ref 70–99)
Potassium: 3.8 mEq/L (ref 3.7–5.3)
Sodium: 138 mEq/L (ref 137–147)
TOTAL PROTEIN: 7.9 g/dL (ref 6.0–8.3)
Total Bilirubin: 0.3 mg/dL (ref 0.3–1.2)

## 2014-02-11 LAB — URINE MICROSCOPIC-ADD ON

## 2014-02-11 LAB — URINALYSIS, ROUTINE W REFLEX MICROSCOPIC
Bilirubin Urine: NEGATIVE
Glucose, UA: >1000 mg/dL — AB
LEUKOCYTES UA: NEGATIVE
NITRITE: NEGATIVE
PH: 5 (ref 5.0–8.0)
Protein, ur: NEGATIVE mg/dL
SPECIFIC GRAVITY, URINE: 1.039 — AB (ref 1.005–1.030)
Urobilinogen, UA: 0.2 mg/dL (ref 0.0–1.0)

## 2014-02-11 LAB — LIPASE, BLOOD: Lipase: 30 U/L (ref 11–59)

## 2014-02-11 MED ORDER — DEXAMETHASONE SODIUM PHOSPHATE 10 MG/ML IJ SOLN
10.0000 mg | Freq: Once | INTRAMUSCULAR | Status: AC
  Administered 2014-02-11: 10 mg via INTRAMUSCULAR
  Filled 2014-02-11: qty 1

## 2014-02-11 MED ORDER — ONDANSETRON 4 MG PO TBDP
4.0000 mg | ORAL_TABLET | Freq: Once | ORAL | Status: AC
  Administered 2014-02-11: 4 mg via ORAL
  Filled 2014-02-11: qty 1

## 2014-02-11 MED ORDER — TRAMADOL HCL 50 MG PO TABS: 50.0000 mg | ORAL_TABLET | Freq: Four times a day (QID) | ORAL | Status: DC | PRN

## 2014-02-11 MED ORDER — HYDROMORPHONE HCL PF 1 MG/ML IJ SOLN
1.0000 mg | Freq: Once | INTRAMUSCULAR | Status: AC
  Administered 2014-02-11: 1 mg via INTRAMUSCULAR
  Filled 2014-02-11: qty 1

## 2014-02-11 NOTE — Discharge Instructions (Signed)
Back Pain, Adult Low back pain is very common. About 1 in 5 people have back pain.The cause of low back pain is rarely dangerous. The pain often gets better over time.About half of people with a sudden onset of back pain feel better in just 2 weeks. About 8 in 10 people feel better by 6 weeks.  CAUSES Some common causes of back pain include:  Strain of the muscles or ligaments supporting the spine.  Wear and tear (degeneration) of the spinal discs.  Arthritis.  Direct injury to the back. DIAGNOSIS Most of the time, the direct cause of low back pain is not known.However, back pain can be treated effectively even when the exact cause of the pain is unknown.Answering your caregiver's questions about your overall health and symptoms is one of the most accurate ways to make sure the cause of your pain is not dangerous. If your caregiver needs more information, he or she Brenn order lab work or imaging tests (X-rays or MRIs).However, even if imaging tests show changes in your back, this usually does not require surgery. HOME CARE INSTRUCTIONS For many people, back pain returns.Since low back pain is rarely dangerous, it is often a condition that people can learn to manageon their own.   Remain active. It is stressful on the back to sit or stand in one place. Do not sit, drive, or stand in one place for more than 30 minutes at a time. Take short walks on level surfaces as soon as pain allows.Try to increase the length of time you walk each day.  Do not stay in bed.Resting more than 1 or 2 days can delay your recovery.  Do not avoid exercise or work.Your body is made to move.It is not dangerous to be active, even though your back Alyshia hurt.Your back will likely heal faster if you return to being active before your pain is gone.  Pay attention to your body when you bend and lift. Many people have less discomfortwhen lifting if they bend their knees, keep the load close to their bodies,and  avoid twisting. Often, the most comfortable positions are those that put less stress on your recovering back.  Find a comfortable position to sleep. Use a firm mattress and lie on your side with your knees slightly bent. If you lie on your back, put a pillow under your knees.  Only take over-the-counter or prescription medicines as directed by your caregiver. Over-the-counter medicines to reduce pain and inflammation are often the most helpful.Your caregiver Chanae prescribe muscle relaxant drugs.These medicines help dull your pain so you can more quickly return to your normal activities and healthy exercise.  Put ice on the injured area.  Put ice in a plastic bag.  Place a towel between your skin and the bag.  Leave the ice on for 15-20 minutes, 03-04 times a day for the first 2 to 3 days. After that, ice and heat Debrah be alternated to reduce pain and spasms.  Ask your caregiver about trying back exercises and gentle massage. This Darrian be of some benefit.  Avoid feeling anxious or stressed.Stress increases muscle tension and can worsen back pain.It is important to recognize when you are anxious or stressed and learn ways to manage it.Exercise is a great option. SEEK MEDICAL CARE IF:  You have pain that is not relieved with rest or medicine.  You have pain that does not improve in 1 week.  You have new symptoms.  You are generally not feeling well. SEEK   IMMEDIATE MEDICAL CARE IF:   You have pain that radiates from your back into your legs.  You develop new bowel or bladder control problems.  You have unusual weakness or numbness in your arms or legs.  You develop nausea or vomiting.  You develop abdominal pain.  You feel faint. Document Released: 11/09/2005 Document Revised: 05/10/2012 Document Reviewed: 03/30/2011 [employer] Patient Information 2014 [employer].  

## 2014-02-11 NOTE — ED Provider Notes (Signed)
CSN: [id number]     Arrival date & time 02/11/14  1055 History   First MD Initiated Contact with Patient 02/11/14 1155     Chief Complaint  Patient presents with  . Flank Pain     (Consider location/radiation/quality/duration/timing/severity/associated sxs/prior Treatment) HPI Comments: Patient presents with left-sided back pain. She states it started about 2 days ago. It radiates down her bilateral thighs. She denies any numbness or weakness in her legs. She states it's worse with movement. She denies abdominal pain. She's had some urinary frequency but no hematuria or burning on urination. She's been taking some over-the-counter medicines without relief. She's had a history of back problems in the past as well as neck surgery. She previously took OxyContin for pain relief but hasn't taken that in several years. She denies a recent injuries to her back. She denies any loss of bowel or bladder control. She denies any nausea vomiting or fevers.  Patient is a 68 y.o. female presenting with flank pain.  Flank Pain Pertinent negatives include no chest pain, no abdominal pain, no headaches and no shortness of breath.    Past Medical History  Diagnosis Date  . Diabetes mellitus   . Hypertension   . GERD (gastroesophageal reflux disease)   . Herniated disc   . Arthritis   . Shortness of breath on exertion 11/10/11    "sometimes"  . H/O hiatal hernia   . Headache(784.0)   . Hemorrhoids   . Chronic back pain greater than 3 months duration    Past Surgical History  Procedure Laterality Date  . Knee surgery      right  . Shoulder open rotator cuff repair  07/20/2011    left  . Fracture surgery  2004?    right shoulder  . Knee arthroscopy  2004    right  . Tonsillectomy    . Cholecystectomy  ~ 2008  . Abdominal hysterectomy  1997?    "partial"  . Neck fusion     No family history on file. History  Substance Use Topics  . Smoking status: Current Every Day Smoker -- 0.20 packs/day  for 40 years    Types: Cigarettes  . Smokeless tobacco: Never Used     Comment: smoking cessation consult entered  . Alcohol Use: No   OB History   Grav Para Term Preterm Abortions TAB SAB Ect Mult Living                 Review of Systems  Constitutional: Negative for fever, chills, diaphoresis and fatigue.  HENT: Negative for congestion, rhinorrhea and sneezing.   Eyes: Negative.   Respiratory: Negative for cough, chest tightness and shortness of breath.   Cardiovascular: Negative for chest pain and leg swelling.  Gastrointestinal: Negative for nausea, vomiting, abdominal pain, diarrhea and blood in stool.  Genitourinary: Negative for frequency, hematuria, flank pain and difficulty urinating.  Musculoskeletal: Positive for back pain. Negative for arthralgias.  Skin: Negative for rash.  Neurological: Negative for dizziness, speech difficulty, weakness, numbness and headaches.      Allergies  Percocet  Home Medications   Current Outpatient Rx  Name  Route  Sig  Dispense  Refill  . amLODipine (NORVASC) 5 MG tablet   Oral   Take 5 mg by mouth daily.         . cholecalciferol (VITAMIN D) 1000 UNITS tablet   Oral   Take 1,000 Units by mouth daily.         Marland Kitchen esomeprazole (  NEXIUM) 40 MG capsule   Oral   Take 40 mg by mouth daily at 12 noon.         Marland Kitchen losartan-hydrochlorothiazide (HYZAAR) 100-12.5 MG per tablet   Oral   Take 1 tablet by mouth daily.           . meclizine (ANTIVERT) 12.5 MG tablet   Oral   Take 12.5 mg by mouth 3 (three) times daily as needed for dizziness.          . metFORMIN (GLUCOPHAGE) 500 MG tablet   Oral   Take 500 mg by mouth 2 (two) times daily.         . metoprolol tartrate (LOPRESSOR) 25 MG tablet   Oral   Take 25 mg by mouth 2 (two) times daily.         . potassium chloride (KLOR-CON) 10 MEQ CR tablet   Oral   Take 20 mEq by mouth daily.          . rosuvastatin (CRESTOR) 10 MG tablet   Oral   Take 10 mg by mouth  daily.         . traMADol (ULTRAM) 50 MG tablet   Oral   Take 1 tablet (50 mg total) by mouth every 6 (six) hours as needed.   15 tablet   0    BP 126/63  Pulse 67  Temp(Src) 98.1 F (36.7 C) (Oral)  Resp 16  SpO2 94% Physical Exam  Constitutional: She is oriented to person, place, and time. She appears well-developed and well-nourished.  HENT:  Head: Normocephalic and atraumatic.  Eyes: Pupils are equal, round, and reactive to light.  Neck: Normal range of motion. Neck supple.  Cardiovascular: Normal rate, regular rhythm and normal heart sounds.   Pulmonary/Chest: Effort normal and breath sounds normal. No respiratory distress. She has no wheezes. She has no rales. She exhibits no tenderness.  Abdominal: Soft. Bowel sounds are normal. There is no tenderness. There is no rebound and no guarding.  Musculoskeletal: Normal range of motion. She exhibits no edema.  Positive tenderness to the left lower back and left lumbar paraspinal area. No step-offs or deformities are noted. Negative straight leg raise bilaterally. No rashes are noted. Patellar reflexes symmetric. She has symmetric pedal pulses. Has normal sensation in the lower extremes bilaterally. Normal motor function in the legs.  Lymphadenopathy:    She has no cervical adenopathy.  Neurological: She is alert and oriented to person, place, and time.  Skin: Skin is warm and dry. No rash noted.  Psychiatric: She has a normal mood and affect.    ED Course  Procedures (including critical care time) Labs Review Results for orders placed during the hospital encounter of 02/11/14  CBC WITH DIFFERENTIAL      Result Value Ref Range   WBC 9.4  4.0 - 10.5 K/uL   RBC 4.44  3.87 - 5.11 MIL/uL   Hemoglobin 13.4  12.0 - 15.0 g/dL   HCT 39.3  36.0 - 46.0 %   MCV 88.5  78.0 - 100.0 fL   MCH 30.2  26.0 - 34.0 pg   MCHC 34.1  30.0 - 36.0 g/dL   RDW 12.9  11.5 - 15.5 %   Platelets 357  150 - 400 K/uL   Neutrophils Relative % 52  43 -  77 %   Neutro Abs 4.9  1.7 - 7.7 K/uL   Lymphocytes Relative 38  12 - 46 %   Lymphs Abs 3.6  0.7 - 4.0 K/uL   Monocytes Relative 5  3 - 12 %   Monocytes Absolute 0.5  0.1 - 1.0 K/uL   Eosinophils Relative 5  0 - 5 %   Eosinophils Absolute 0.4  0.0 - 0.7 K/uL   Basophils Relative 0  0 - 1 %   Basophils Absolute 0.0  0.0 - 0.1 K/uL  COMPREHENSIVE METABOLIC PANEL      Result Value Ref Range   Sodium 138  137 - 147 mEq/L   Potassium 3.8  3.7 - 5.3 mEq/L   Chloride 95 (*) 96 - 112 mEq/L   CO2 29  19 - 32 mEq/L   Glucose, Bld 158 (*) 70 - 99 mg/dL   BUN 12  6 - 23 mg/dL   Creatinine, Ser 0.95  0.50 - 1.10 mg/dL   Calcium 9.6  8.4 - 10.5 mg/dL   Total Protein 7.9  6.0 - 8.3 g/dL   Albumin 3.6  3.5 - 5.2 g/dL   AST 17  0 - 37 U/L   ALT 10  0 - 35 U/L   Alkaline Phosphatase 155 (*) 39 - 117 U/L   Total Bilirubin 0.3  0.3 - 1.2 mg/dL   GFR calc non Af Amer 61 (*) >90 mL/min   GFR calc Af Amer 71 (*) >90 mL/min  URINALYSIS, ROUTINE W REFLEX MICROSCOPIC      Result Value Ref Range   Color, Urine YELLOW  YELLOW   APPearance CLEAR  CLEAR   Specific Gravity, Urine 1.039 (*) 1.005 - 1.030   pH 5.0  5.0 - 8.0   Glucose, UA >1000 (*) NEGATIVE mg/dL   Hgb urine dipstick SMALL (*) NEGATIVE   Bilirubin Urine NEGATIVE  NEGATIVE   Ketones, ur >80 (*) NEGATIVE mg/dL   Protein, ur NEGATIVE  NEGATIVE mg/dL   Urobilinogen, UA 0.2  0.0 - 1.0 mg/dL   Nitrite NEGATIVE  NEGATIVE   Leukocytes, UA NEGATIVE  NEGATIVE  LIPASE, BLOOD      Result Value Ref Range   Lipase 30  11 - 59 U/L  URINE MICROSCOPIC-ADD ON      Result Value Ref Range   Squamous Epithelial / LPF RARE  RARE   WBC, UA 0-2  <3 WBC/hpf   RBC / HPF 3-6  <3 RBC/hpf   Bacteria, UA RARE  RARE   Ct Abdomen Pelvis Wo Contrast  02/11/2014   CLINICAL DATA:  68 year old female with left flank, abdominal and pelvic pain. Hematuria  EXAM: CT ABDOMEN AND PELVIS WITHOUT CONTRAST  TECHNIQUE: Multidetector CT imaging of the abdomen and pelvis was  performed following the standard protocol without intravenous contrast.  COMPARISON:  07/13/2011  FINDINGS: The lung bases are clear.  The liver, spleen, kidneys, adrenal glands, and pancreas are unremarkable except for a probable left renal cyst. There is no evidence of hydronephrosis or ureteral calculi.  Please note that parenchymal abnormalities Mildred be missed without intravenous contrast.  Patient is status post cholecystectomy and hysterectomy.  There is no evidence of free fluid, enlarged lymph nodes, biliary dilation or abdominal aortic aneurysm.  The bowel, bladder and appendix are unremarkable. There is no evidence of bowel obstruction, abscess or pneumoperitoneum.  No acute or suspicious bony abnormalities.  IMPRESSION: No acute abnormalities or significant abnormalities.   Electronically Signed   By: Hassan Rowan M.D.   On: 02/11/2014 15:47   Dg Lumbar Spine Complete  02/11/2014   CLINICAL DATA:  Lower left side neck pain radiating into  leg, no injury  EXAM: LUMBAR SPINE - COMPLETE 4+ VIEW  COMPARISON:  01/15/2009  FINDINGS: Bones appear mildly demineralized.  5 non-rib-bearing lumbar vertebrae.  Mild facet degenerative changes lower lumbar spine.  Mild disc space narrowing and minimal anterolisthesis at L4-5 unchanged.  Small anterior spurs at L5-S1.  Vertebral body heights maintained without fracture or additional subluxation.  No bone destruction.  SI joints symmetric.  Atherosclerotic calcifications again noted at aorta and iliac arteries.  IMPRESSION: Osseous demineralization with mild degenerative disc and facet disease changes of the lower lumbar spine as above.  No acute abnormalities or significant interval change.   Electronically Signed   By: Lavonia Dana M.D.   On: 02/11/2014 12:36     Imaging Review Ct Abdomen Pelvis Wo Contrast  02/11/2014   CLINICAL DATA:  68 year old female with left flank, abdominal and pelvic pain. Hematuria  EXAM: CT ABDOMEN AND PELVIS WITHOUT CONTRAST  TECHNIQUE:  Multidetector CT imaging of the abdomen and pelvis was performed following the standard protocol without intravenous contrast.  COMPARISON:  07/13/2011  FINDINGS: The lung bases are clear.  The liver, spleen, kidneys, adrenal glands, and pancreas are unremarkable except for a probable left renal cyst. There is no evidence of hydronephrosis or ureteral calculi.  Please note that parenchymal abnormalities Jamelyn be missed without intravenous contrast.  Patient is status post cholecystectomy and hysterectomy.  There is no evidence of free fluid, enlarged lymph nodes, biliary dilation or abdominal aortic aneurysm.  The bowel, bladder and appendix are unremarkable. There is no evidence of bowel obstruction, abscess or pneumoperitoneum.  No acute or suspicious bony abnormalities.  IMPRESSION: No acute abnormalities or significant abnormalities.   Electronically Signed   By: Hassan Rowan M.D.   On: 02/11/2014 15:47   Dg Lumbar Spine Complete  02/11/2014   CLINICAL DATA:  Lower left side neck pain radiating into leg, no injury  EXAM: LUMBAR SPINE - COMPLETE 4+ VIEW  COMPARISON:  01/15/2009  FINDINGS: Bones appear mildly demineralized.  5 non-rib-bearing lumbar vertebrae.  Mild facet degenerative changes lower lumbar spine.  Mild disc space narrowing and minimal anterolisthesis at L4-5 unchanged.  Small anterior spurs at L5-S1.  Vertebral body heights maintained without fracture or additional subluxation.  No bone destruction.  SI joints symmetric.  Atherosclerotic calcifications again noted at aorta and iliac arteries.  IMPRESSION: Osseous demineralization with mild degenerative disc and facet disease changes of the lower lumbar spine as above.  No acute abnormalities or significant interval change.   Electronically Signed   By: Lavonia Dana M.D.   On: 02/11/2014 12:36     EKG Interpretation None      MDM   Final diagnoses:  Back pain    Patient presents with lower back pain which seems to be musculoskeletal in  nature. His new neurologic deficits. It's worse with movement. She was given Decadron and pain medication in the ED with improvement in symptoms. She did have some hematuria so I did do a CT scan to rule out kidney stone and this was negative. She does have a history of kidney stones but none were noted today as a cause for her symptoms. She did have some ketones and glucose in her urine which would explain urinary frequency. She has no symptoms suggestive of DKA. She's otherwise well-appearing. She was encouraged to increase her water intake. She was advised to followup with her primary care physician within the next 2-3 days for recheck. I did advise her she'll need to  have a recheck on her urine to make sure the blood clears. I did note her to have some trace hematuria on prior urinalysis. She was given prescription for Ultram to use at home for the pain.    Malvin Johns, MD 02/11/14 [phone]

## 2014-02-11 NOTE — ED Notes (Signed)
Pt presents to department for evaluation of L sided flank pain. Onset yesterday. States urinary frequency. Denies hematuria. 9/10 pain at the time. Pt is conscious alert and oriented x4.

## 2014-02-12 NOTE — ED Provider Notes (Signed)
Medical screening examination/treatment/procedure(s) were performed by non-physician practitioner and as supervising physician I was immediately available for consultation/collaboration.   Dot Lanes, MD 02/12/14 [phone]

## 2014-05-21 ENCOUNTER — Encounter (HOSPITAL_COMMUNITY): Payer: Self-pay | Admitting: Emergency Medicine

## 2014-05-21 ENCOUNTER — Emergency Department (HOSPITAL_COMMUNITY)
Admission: EM | Admit: 2014-05-21 | Discharge: 2014-05-21 | Disposition: A | Discharge status: Home / Self Care | Payer: Medicare Other | Attending: Emergency Medicine | Admitting: Emergency Medicine

## 2014-05-21 DIAGNOSIS — R42 Dizziness and giddiness: Secondary | ICD-10-CM | POA: Insufficient documentation

## 2014-05-21 HISTORY — DX: Dizziness and giddiness: R42

## 2014-05-21 LAB — CBG MONITORING, ED: GLUCOSE-CAPILLARY: 196 mg/dL — AB (ref 70–99)

## 2014-05-21 NOTE — ED Notes (Signed)
Patient was brought a wheelchair and advised she would be placed back into the lobby until a room became available. Patient stated "I'm going back out there? I'll just leave, I can see my doctor tomorrow." Patient ambulated out of the department without difficulty, with family.

## 2014-05-21 NOTE — ED Notes (Addendum)
Patient c/o dizziness, states she was here with her sister when symptoms started. Patient felt like her blood pressure was high initially. BP normal at this time. Patient with hx of vertigo x1 episode, states this feels different.

## 2014-06-25 ENCOUNTER — Other Ambulatory Visit: Payer: Self-pay | Admitting: Cardiovascular Disease

## 2014-06-25 ENCOUNTER — Ambulatory Visit
Admission: RE | Admit: 2014-06-25 | Discharge: 2014-06-25 | Disposition: A | Discharge status: Home / Self Care | Payer: Medicare Other | Source: Ambulatory Visit | Attending: Cardiovascular Disease | Admitting: Cardiovascular Disease

## 2014-06-25 DIAGNOSIS — R0602 Shortness of breath: Secondary | ICD-10-CM

## 2014-07-12 ENCOUNTER — Emergency Department (HOSPITAL_COMMUNITY)
Admission: EM | Admit: 2014-07-12 | Discharge: 2014-07-12 | Discharge status: Against Medical Advice | Payer: Medicare Other | Attending: Emergency Medicine | Admitting: Emergency Medicine

## 2014-07-12 ENCOUNTER — Encounter (HOSPITAL_COMMUNITY): Payer: Self-pay | Admitting: Emergency Medicine

## 2014-07-12 DIAGNOSIS — F17200 Nicotine dependence, unspecified, uncomplicated: Secondary | ICD-10-CM | POA: Insufficient documentation

## 2014-07-12 DIAGNOSIS — I10 Essential (primary) hypertension: Secondary | ICD-10-CM | POA: Insufficient documentation

## 2014-07-12 DIAGNOSIS — E119 Type 2 diabetes mellitus without complications: Secondary | ICD-10-CM | POA: Insufficient documentation

## 2014-07-12 DIAGNOSIS — G8929 Other chronic pain: Secondary | ICD-10-CM | POA: Diagnosis not present

## 2014-07-12 DIAGNOSIS — R51 Headache: Secondary | ICD-10-CM | POA: Insufficient documentation

## 2014-07-12 NOTE — ED Notes (Signed)
Pt reports having a headache and "feeling like the room is spinning" since 530pm, pt states she checked her BP at home and it was 177/90.  Pt alert and oriented X 4 upon arrival to triage- neuro exam negative.

## 2014-11-17 ENCOUNTER — Emergency Department (HOSPITAL_COMMUNITY): Payer: Medicare Other

## 2014-11-17 ENCOUNTER — Emergency Department (HOSPITAL_COMMUNITY)
Admission: EM | Admit: 2014-11-17 | Discharge: 2014-11-17 | Disposition: A | Discharge status: Home / Self Care | Payer: Medicare Other | Source: Home / Self Care | Attending: Emergency Medicine | Admitting: Emergency Medicine

## 2014-11-17 ENCOUNTER — Encounter (HOSPITAL_COMMUNITY): Payer: Self-pay | Admitting: Family Medicine

## 2014-11-17 DIAGNOSIS — G8929 Other chronic pain: Secondary | ICD-10-CM | POA: Insufficient documentation

## 2014-11-17 DIAGNOSIS — J45901 Unspecified asthma with (acute) exacerbation: Secondary | ICD-10-CM | POA: Diagnosis not present

## 2014-11-17 DIAGNOSIS — Z79899 Other long term (current) drug therapy: Secondary | ICD-10-CM | POA: Insufficient documentation

## 2014-11-17 DIAGNOSIS — K219 Gastro-esophageal reflux disease without esophagitis: Secondary | ICD-10-CM | POA: Insufficient documentation

## 2014-11-17 DIAGNOSIS — Z720 Tobacco use: Secondary | ICD-10-CM

## 2014-11-17 DIAGNOSIS — I10 Essential (primary) hypertension: Secondary | ICD-10-CM

## 2014-11-17 DIAGNOSIS — E119 Type 2 diabetes mellitus without complications: Secondary | ICD-10-CM

## 2014-11-17 DIAGNOSIS — M1990 Unspecified osteoarthritis, unspecified site: Secondary | ICD-10-CM

## 2014-11-17 DIAGNOSIS — J209 Acute bronchitis, unspecified: Secondary | ICD-10-CM

## 2014-11-17 DIAGNOSIS — R059 Cough, unspecified: Secondary | ICD-10-CM

## 2014-11-17 DIAGNOSIS — R0602 Shortness of breath: Secondary | ICD-10-CM | POA: Diagnosis not present

## 2014-11-17 DIAGNOSIS — J40 Bronchitis, not specified as acute or chronic: Secondary | ICD-10-CM

## 2014-11-17 DIAGNOSIS — R05 Cough: Secondary | ICD-10-CM

## 2014-11-17 HISTORY — DX: Other chronic pain: G89.29

## 2014-11-17 HISTORY — DX: Personal history of other medical treatment: Z92.89

## 2014-11-17 HISTORY — DX: Cervicalgia: M54.2

## 2014-11-17 MED ORDER — PREDNISONE 20 MG PO TABS: 40.0000 mg | ORAL_TABLET | Freq: Every day | ORAL | Status: DC

## 2014-11-17 MED ORDER — AEROCHAMBER PLUS FLO-VU LARGE MISC
1.0000 | Freq: Once | Status: DC
  Filled 2014-11-17 (×2): qty 1

## 2014-11-17 MED ORDER — PREDNISONE 20 MG PO TABS
60.0000 mg | ORAL_TABLET | Freq: Once | ORAL | Status: AC
  Administered 2014-11-17: 60 mg via ORAL
  Filled 2014-11-17: qty 3

## 2014-11-17 MED ORDER — BENZONATATE 100 MG PO CAPS: 100.0000 mg | ORAL_CAPSULE | Freq: Three times a day (TID) | ORAL | Status: DC | PRN

## 2014-11-17 MED ORDER — IPRATROPIUM-ALBUTEROL 0.5-2.5 (3) MG/3ML IN SOLN
3.0000 mL | Freq: Once | RESPIRATORY_TRACT | Status: AC
  Administered 2014-11-17: 3 mL via RESPIRATORY_TRACT
  Filled 2014-11-17: qty 3

## 2014-11-17 MED ORDER — AEROCHAMBER PLUS W/MASK MISC
1.0000 | Freq: Once | Status: AC
  Administered 2014-11-17: 1
  Filled 2014-11-17: qty 1

## 2014-11-17 MED ORDER — ALBUTEROL SULFATE HFA 108 (90 BASE) MCG/ACT IN AERS
2.0000 | INHALATION_SPRAY | RESPIRATORY_TRACT | Status: AC
  Administered 2014-11-17: 2 via RESPIRATORY_TRACT
  Filled 2014-11-17: qty 6.7

## 2014-11-17 MED ORDER — AEROCHAMBER Z-STAT PLUS/MEDIUM MISC: 1.0000 | Freq: Once | Status: DC

## 2014-11-17 MED ORDER — ALBUTEROL SULFATE (2.5 MG/3ML) 0.083% IN NEBU
2.5000 mg | INHALATION_SOLUTION | Freq: Once | RESPIRATORY_TRACT | Status: AC
  Administered 2014-11-17: 2.5 mg via RESPIRATORY_TRACT
  Filled 2014-11-17: qty 3

## 2014-11-17 NOTE — ED Notes (Signed)
Per pt sts cough and congestion since yesterday. sts coughing up some phlegm. sts she took some cold meds.

## 2014-11-17 NOTE — ED Notes (Signed)
Pt. Demonstrated proper use of aerochamber with inhaler.

## 2014-11-17 NOTE — ED Provider Notes (Signed)
CSN: [id number]     Arrival date & time 11/17/14  1001 History   First MD Initiated Contact with Patient 11/17/14 1233     Chief Complaint  Patient presents with  . Cough  . Wheezing    HPI  Pt was seen at 1250. Per pt, c/o gradual onset and persistence of constant cough for the past 2 days. Has been associated with "wheezing," runny/stuffy nose and sinus congestion. Pt continues to smoke cigarettes. Denies SOB, no CP/palpitations, no abd pain, no N/V/D, no fevers, no back pain, no rash, no sore throat.   Past Medical History  Diagnosis Date  . Diabetes mellitus   . Hypertension   . GERD (gastroesophageal reflux disease)   . Herniated disc   . Arthritis   . Shortness of breath on exertion 11/10/11    "sometimes"  . H/O hiatal hernia   . Headache(784.0)   . Hemorrhoids   . Chronic back pain greater than 3 months duration   . Vertigo   . Chronic neck pain   . Hx of echocardiogram 2010    normal EF   Past Surgical History  Procedure Laterality Date  . Knee surgery      right  . Shoulder open rotator cuff repair  07/20/2011    left  . Fracture surgery  2004?    right shoulder  . Knee arthroscopy  2004    right  . Tonsillectomy    . Cholecystectomy  ~ 2008  . Abdominal hysterectomy  1997?    "partial"  . Neck fusion      History  Substance Use Topics  . Smoking status: Current Every Day Smoker -- 0.20 packs/day for 40 years    Types: Cigarettes  . Smokeless tobacco: Never Used     Comment: smoking cessation consult entered  . Alcohol Use: No    Review of Systems ROS: Statement: All systems negative except as marked or noted in the HPI; Constitutional: Negative for fever and chills. ; ; Eyes: Negative for eye pain, redness and discharge. ; ; ENMT: Negative for ear pain, hoarseness, sore throat. +nasal congestion, sinus pressure. ; ; Cardiovascular: Negative for chest pain, palpitations, diaphoresis, dyspnea and peripheral edema. ; ; Respiratory: +cough, wheezing.  Negative for stridor. ; ; Gastrointestinal: Negative for nausea, vomiting, diarrhea, abdominal pain, blood in stool, hematemesis, jaundice and rectal bleeding. . ; ; Genitourinary: Negative for dysuria, flank pain and hematuria. ; ; Musculoskeletal: Negative for back pain and neck pain. Negative for swelling and trauma.; ; Skin: Negative for pruritus, rash, abrasions, blisters, bruising and skin lesion.; ; Neuro: Negative for headache, lightheadedness and neck stiffness. Negative for weakness, altered level of consciousness , altered mental status, extremity weakness, paresthesias, involuntary movement, seizure and syncope.      Allergies  Percocet  Home Medications   Prior to Admission medications   Medication Sig Start Date End Date Taking? Authorizing Provider  amLODipine (NORVASC) 5 MG tablet Take 5 mg by mouth daily.   Yes Historical Provider, MD  cholecalciferol (VITAMIN D) 1000 UNITS tablet Take 1,000 Units by mouth daily.   Yes Historical Provider, MD  esomeprazole (NEXIUM) 40 MG capsule Take 40 mg by mouth daily at 12 noon.   Yes Historical Provider, MD  losartan-hydrochlorothiazide (HYZAAR) 100-12.5 MG per tablet Take 1 tablet by mouth daily.     Yes Historical Provider, MD  metFORMIN (GLUCOPHAGE) 500 MG tablet Take 500 mg by mouth 2 (two) times daily. 01/06/14  Yes Historical Provider, MD  metoprolol tartrate (LOPRESSOR) 25 MG tablet Take 25 mg by mouth 2 (two) times daily.   Yes Historical Provider, MD  potassium chloride (KLOR-CON) 10 MEQ CR tablet Take 20 mEq by mouth daily.  11/11/11  Yes Birdie Riddle, MD  rosuvastatin (CRESTOR) 10 MG tablet Take 10 mg by mouth daily.   Yes Historical Provider, MD  traMADol (ULTRAM) 50 MG tablet Take 1 tablet (50 mg total) by mouth every 6 (six) hours as needed. 02/11/14   Malvin Johns, MD   BP 147/68 mmHg  Pulse 65  Temp(Src) 97.9 F (36.6 C) (Oral)  Resp 19  SpO2 98% Physical Exam  1255; Physical examination:  Nursing notes reviewed;  Vital signs and O2 SAT reviewed;  Constitutional: Well developed, Well nourished, Well hydrated, In no acute distress; Head:  Normocephalic, atraumatic; Eyes: EOMI, PERRL, No scleral icterus; ENMT: TM's clear bilat. +edemetous nasal turbinates bilat with clear rhinorrhea. Mouth and pharynx normal, Mucous membranes moist; Neck: Supple, Full range of motion, No lymphadenopathy; Cardiovascular: Regular rate and rhythm, No murmur, rub, or gallop; Respiratory: Breath sounds diminished & equal bilaterally, faint scattered exp wheezes. +moist cough with occasional audible exp wheeze. No retrax or access mm use. Speaking full sentences with ease, Normal respiratory effort/excursion; Chest: Nontender, Movement normal; Abdomen: Soft, Nontender, Nondistended, Normal bowel sounds; Genitourinary: No CVA tenderness; Extremities: Pulses normal, No tenderness, No edema, No calf edema or asymmetry.; Neuro: AA&Ox3, Major CN grossly intact.  Speech clear. No gross focal motor or sensory deficits in extremities.; Skin: Color normal, Warm, Dry.   ED Course  Procedures     EKG Interpretation None      MDM  MDM Reviewed: previous chart, vitals and nursing note Reviewed previous: x-ray Interpretation: x-ray     Dg Chest 2 View 11/17/2014   CLINICAL DATA:  Acute productive cough for 2 days with hypertension diabetes  EXAM: CHEST  2 VIEW  COMPARISON:  06/25/2014  FINDINGS: The heart size and mediastinal contours are within normal limits. Both lungs are clear. The visualized skeletal structures are unremarkable. Atherosclerotic changes of the aorta. Degenerative changes noted of the spine. Prior cholecystectomy evident. Overall stable exam.  IMPRESSION: No active cardiopulmonary disease.   Electronically Signed   By: Daryll Brod M.D.   On: 11/17/2014 14:21    1435:  Pt states she "feels better" after neb.  NAD, lungs CTA bilat, no wheezing, resps easy, speaking full sentences, Sats 98% R/A.  Pt states she wants to go  home now. Pt without known COPD, but does continue to smoke. Will tx for bronchitis at this time. Dx and testing d/w pt and family.  Questions answered.  Verb understanding, agreeable to d/c home with outpt f/u.    Francine Graven, DO 11/20/14 1451

## 2014-11-17 NOTE — Discharge Instructions (Signed)
°Emergency Department Resource Guide °1) Find a Doctor and Pay Out of Pocket °Although you won't have to find out who is covered by your insurance plan, it is a good idea to ask around and get recommendations. You will then need to call the office and see if the doctor you have chosen will accept you as a new patient and what types of options they offer for patients who are self-pay. Some doctors offer discounts or will set up payment plans for their patients who do not have insurance, but you will need to ask so you aren't surprised when you get to your appointment. ° °2) Contact Your Local Health Department °Not all health departments have doctors that can see patients for sick visits, but many do, so it is worth a call to see if yours does. If you don't know where your local health department is, you can check in your phone book. The CDC also has a tool to help you locate your state's health department, and many state websites also have listings of all of their local health departments. ° °3) Find a Walk-in Clinic °If your illness is not likely to be very severe or complicated, you [place] want to try a walk in clinic. These are popping up all over the country in pharmacies, drugstores, and shopping centers. They're usually staffed by nurse practitioners or physician assistants that have been trained to treat common illnesses and complaints. They're usually fairly quick and inexpensive. However, if you have serious medical issues or chronic medical problems, these are probably not your best option. ° °No Primary Care Doctor: °- Call [facility] at  832-8000 - they can help you locate a primary care doctor that  accepts your insurance, provides certain services, etc. °- Physician Referral Service- 1-800-533-3463 ° °Chronic Pain Problems: °Organization         Address  Phone   Notes  °[facility]  ([phone]) [phone] Patients need to be referred by their primary care doctor.  ° °Medication  Assistance: °Organization         Address  Phone   Notes  °[place] [facility] [address] °[place], [place] [address] (336) 641-8030 --Must be a resident of [place] °-- Must have NO insurance coverage whatsoever (no Medicaid/ Medicare, etc.) °-- The pt. MUST have a primary care doctor that directs their care regularly and follows them in the community °  °[facility]  (866) 331-1348   °[employer]  (888) 892-1162   ° °Agencies that provide inexpensive medical care: °Organization         Address  Phone   Notes  °[facility]  (336) 832-8035   °[facility]    (336) 832-7272   °[facility] [address] °[place], [place] [address] (336) 832-4777   °[facility] [address], °[place] (336) 271-4999   °[facility]    (336) 373-0678   °[facility]    (336) 272-1050   °[facility] ° [address], [place] Phone:  (336) 832-4444, Fax:  (336) 832-4440 Hours of Operation:  9 am - 6 pm, M-F.  Also accepts Medicaid/Medicare and self-pay.  °[facility] ° [address], [place] Phone: (336) [phone], Fax: (336) [phone]. Hours of Operation:  8:30 am - 5:30 pm, M-F.  Also accepts Medicaid and self-pay.  °[facility] [place] [address], [place] Phone: (336) 878-6027   °[facility] [address], [place], [place] (336)723-1848, Ext. 123 Mondays & Thursdays: 7-9 AM.  First 15 patients are seen on a first come, first serve basis. °  ° °Medicaid-accepting [place] Providers: ° °Organization         Address  Phone   Notes  °[facility] [address], [place] (336) 641-2100 Also accepts self-pay patients.  °[facility] [address], [place] ° (336) 856-9996   °[facility] [address], [place]  (336) 288-8857   °[facility] [address] [place] [address], [place] (336) 299-7000   °Veita Bland [address], [place]  ° (336) 373-1557 Only accepts [place] Access Medicaid patients after they have their name applied to their card.  ° °Self-Pay (no insurance) in [place]: ° °Organization         Address  Phone   Notes  °Sickle Cell Patients, [facility] [address], [place] (336) 832-1970   °[facility] [address], [place] (336) 832-4400   °[facility] ° [address], [place] (336) 992-4800   °[facility]/Dr. Osei-Bonsu ° [address] [place] [address], [place] or [address], [place] (336) [phone] Phone number for both [place] and [place] locations is the same.  °[facility] [address], [place] (336) 299-0000   °[facility] [address] [place] [address], [place] or [address] (336) 852-7530 °(336) 878-2260   °[facility] [address], [place] (336) 350-1642, phone; (336) 294-5005, fax Sees patients 1st and 3rd Saturday of every month.  Must not qualify for public or private insurance (i.e. Medicaid, Medicare, [place] Health Choice, Veterans' Benefits) • Household income should be no more than 200% of the poverty level •The clinic cannot treat you if you are pregnant or think you are pregnant • Sexually transmitted diseases are not treated at the clinic.  ° ° °Dental Care: °Organization         Address  Phone  Notes  °[place] [facility] [address], [place] (336) 641-6152 Accepts children up to age 21 who are enrolled in Medicaid or  [place] Health Choice; pregnant women with a Medicaid card; and children who have applied for Medicaid or [place] Health Choice, but were declined, whose parents can pay a reduced fee at time of service.  °[place]  [facility] [place]  [address], [place] (336) 641-7733 Accepts children up to age 21 who are enrolled in Medicaid or [place] Health Choice; pregnant women with a Medicaid card; and children who have applied for Medicaid or [place] Health Choice, but were declined, whose parents can pay a reduced fee at time of service.  °[facility] ° [address],  (336) 641-4533 Patients are seen by appointment only. Walk-ins are not accepted. [facility] will see patients 18 years of age and older. °Monday - Tuesday (8am-5pm) °Most Wednesdays (8:30-5pm) °$30 per visit, cash only  °[facility] ° [address], [place] (336) [phone] Patients are seen by appointment only. Walk-ins are not accepted. [facility] will see patients 18 years of age and older. °One   Wednesday Evening (Monthly: Volunteer Based).  $30 per visit, cash only  °[facility]  (919) [phone] for adults; Children under age 4, call Graduate Pediatric Dentistry at ([phone]) [phone]. Children aged 4-14, please call ([phone]) [phone] to request a pediatric application. ° Dental services are provided in all areas of dental care including fillings, crowns and bridges, complete and partial dentures, implants, gum treatment, root canals, and extractions. Preventive care is also provided. Treatment is provided to both adults and children. °Patients are selected via a lottery and there is often a waiting list. °  °[facility] [address], °[place] ° (336) 763-8833 [email or web address] °  °[facility] [address], [place], [place] (336)723-1848, Ext. 123 Second and Fourth Thursday of each month, opens at 6:30 AM; Clinic ends at 9 AM.  Patients are seen on a first-come first-served basis, and a limited number are seen during each clinic.  ° °[facility] ° [address], [place], [place] (336) [phone]    Eligibility Requirements °You must have lived in [place], or [place] counties for at least the last three months. °  You cannot be eligible for state or federal sponsored healthcare insurance, including Veterans Administration, Medicaid, or Medicare. °  You generally cannot be eligible for healthcare insurance through your employer.  °  How to apply: °Eligibility screenings are held every Tuesday and Wednesday afternoon from 1:00 pm until 4:00 pm. You do not need an appointment for the interview!  °[facility] [address], [place], [place] 336-631-2330   °[facility]  336-342-8273   °[facility]  336-703-3100   °[facility]  336-570-6415   ° °Behavioral Health Resources in the Community: °Intensive Outpatient Programs °Organization         Address  Phone  Notes  °[facility] [address], [place], [place] 336-878-6098   °[facility] [address], [place], [place] 336-832-9800   °[facility] [address], [place], [place] ° 336-882-2125   °[facility] [address],  °[place], [place] 1-800-853-5163 or 336-641-4981   °Substance Abuse Resources °Organization         Address  Phone  Notes  °[facility]  336-882-2125   °[facility]  336-784-9470   °[facility]  336-285-9073   °[facility]  336-845-3988   °Residential & Outpatient Substance Abuse Program  1-800-659-3381   °Psychological Services °Organization         Address  Phone  Notes  °[facility]  336- 832-9600   °[facility]  336- 378-7881   °[facility] [address], [place] 1-800-853-5163 or 336-641-4981   ° °Mobile Crisis Teams °Organization         Address  Phone  Notes  °[facility], Mobile Crisis Care Unit  1-877-626-1772   °Assertive °[facility] ° [address]  [place], [place] 336-834-9664   °Sharon DeEsch [address] °[place] 336-554-5454   ° °Self-Help/Support Groups °Organization         Address  Phone             Notes  °[facility] - variety of support groups  336- 373-1402 Call for more information  °Narcotics Anonymous (NA), [facility] [address], °[place]  2 meetings at this location  ° °  Residential Treatment Programs Organization         Address  Phone  Notes  [facility] [address]    [place]  1-606-385-2404   [facility]  [address] [place] [address], [place], [place]   [place], [place] [phone] Admissions: 8am-3pm M-F  [facility] [place] [address],    [place], [place] [phone]   [facility] [address] [place], [place], [place]   [facility] [address],  [place], [place]   [facility] - Intensive Outpatient [address], [place], [place]   [facility]  [place], [place] 1-321-269-9499 or 807-292-6944   [facility] [address], [place], [place] Accepts Medicaid  [facility] [address],  [place] 1-857-125-4571 Substance Abuse/Addiction Treatment   [facility]         Address  Phone  Notes  [facility]  603-652-6652   Domenic Schwab, PhD [address] [place], [place]   (331) 052-1674 or 936-139-5390   [facility] [place], [place] [phone]   [facility] [address], [place], [place] [phone] Insurance/Medicaid/sponsorship through [facility] [address]                                    [place], [place] [phone] [place] [address] [place], [place] [phone]) [phone]    Dr. Adele Schilder  (870)[phone]   [facility] Dept. 1) [address] [place] 2) [place] 3)  [address], [place] [phone]  [phone]   [place] (279)467-9285 or 816-808-2772 (After Hours)      Take the prescriptions as directed.  Use your albuterol inhaler (2 to 4 puffs) every 4 hours for the next 7 days, then as needed for cough, wheezing, or shortness of breath. Try to stop smoking. Call your regular medical doctor tomorrow morning to schedule a follow up appointment within the next 3 days.  Return to the Emergency Department immediately sooner if worsening.

## 2014-11-19 ENCOUNTER — Inpatient Hospital Stay (HOSPITAL_COMMUNITY)
Admission: AD | Admit: 2014-11-19 | Discharge: 2014-11-25 | DRG: 203 | Disposition: A | Discharge status: Home / Self Care | Payer: Medicare Other | Source: Ambulatory Visit | Attending: Cardiovascular Disease | Admitting: Cardiovascular Disease

## 2014-11-19 ENCOUNTER — Inpatient Hospital Stay (HOSPITAL_COMMUNITY): Payer: Medicare Other

## 2014-11-19 DIAGNOSIS — F419 Anxiety disorder, unspecified: Secondary | ICD-10-CM | POA: Diagnosis present

## 2014-11-19 DIAGNOSIS — K219 Gastro-esophageal reflux disease without esophagitis: Secondary | ICD-10-CM | POA: Diagnosis present

## 2014-11-19 DIAGNOSIS — M545 Low back pain: Secondary | ICD-10-CM | POA: Diagnosis present

## 2014-11-19 DIAGNOSIS — Z6834 Body mass index (BMI) 34.0-34.9, adult: Secondary | ICD-10-CM

## 2014-11-19 DIAGNOSIS — J209 Acute bronchitis, unspecified: Secondary | ICD-10-CM | POA: Diagnosis present

## 2014-11-19 DIAGNOSIS — R0602 Shortness of breath: Secondary | ICD-10-CM | POA: Diagnosis present

## 2014-11-19 DIAGNOSIS — I10 Essential (primary) hypertension: Secondary | ICD-10-CM | POA: Diagnosis present

## 2014-11-19 DIAGNOSIS — M1990 Unspecified osteoarthritis, unspecified site: Secondary | ICD-10-CM | POA: Diagnosis present

## 2014-11-19 DIAGNOSIS — E8770 Fluid overload, unspecified: Secondary | ICD-10-CM | POA: Diagnosis not present

## 2014-11-19 DIAGNOSIS — Z981 Arthrodesis status: Secondary | ICD-10-CM

## 2014-11-19 DIAGNOSIS — J45901 Unspecified asthma with (acute) exacerbation: Secondary | ICD-10-CM | POA: Diagnosis present

## 2014-11-19 DIAGNOSIS — E119 Type 2 diabetes mellitus without complications: Secondary | ICD-10-CM

## 2014-11-19 DIAGNOSIS — J208 Acute bronchitis due to other specified organisms: Secondary | ICD-10-CM | POA: Diagnosis present

## 2014-11-19 LAB — CBC WITH DIFFERENTIAL/PLATELET
BASOS PCT: 0 % (ref 0–1)
Basophils Absolute: 0 10*3/uL (ref 0.0–0.1)
Eosinophils Absolute: 0.1 10*3/uL (ref 0.0–0.7)
Eosinophils Relative: 1 % (ref 0–5)
HEMATOCRIT: 39.5 % (ref 36.0–46.0)
HEMOGLOBIN: 13.1 g/dL (ref 12.0–15.0)
LYMPHS ABS: 2.1 10*3/uL (ref 0.7–4.0)
LYMPHS PCT: 26 % (ref 12–46)
MCH: 29.8 pg (ref 26.0–34.0)
MCHC: 33.2 g/dL (ref 30.0–36.0)
MCV: 90 fL (ref 78.0–100.0)
MONO ABS: 0.8 10*3/uL (ref 0.1–1.0)
MONOS PCT: 10 % (ref 3–12)
NEUTROS ABS: 5.2 10*3/uL (ref 1.7–7.7)
Neutrophils Relative %: 63 % (ref 43–77)
Platelets: 336 10*3/uL (ref 150–400)
RBC: 4.39 MIL/uL (ref 3.87–5.11)
RDW: 13.2 % (ref 11.5–15.5)
WBC: 8.1 10*3/uL (ref 4.0–10.5)

## 2014-11-19 LAB — COMPREHENSIVE METABOLIC PANEL
ALK PHOS: 120 U/L — AB (ref 39–117)
ALT: 15 U/L (ref 0–35)
ANION GAP: 11 (ref 5–15)
AST: 24 U/L (ref 0–37)
Albumin: 3.5 g/dL (ref 3.5–5.2)
BUN: 12 mg/dL (ref 6–23)
CO2: 23 mmol/L (ref 19–32)
Calcium: 9.1 mg/dL (ref 8.4–10.5)
Chloride: 106 mEq/L (ref 96–112)
Creatinine, Ser: 1.16 mg/dL — ABNORMAL HIGH (ref 0.50–1.10)
GFR calc Af Amer: 55 mL/min — ABNORMAL LOW (ref >90)
GFR calc non Af Amer: 48 mL/min — ABNORMAL LOW (ref >90)
Glucose, Bld: 150 mg/dL — ABNORMAL HIGH (ref 70–99)
POTASSIUM: 3.7 mmol/L (ref 3.5–5.1)
SODIUM: 140 mmol/L (ref 135–145)
TOTAL PROTEIN: 6.8 g/dL (ref 6.0–8.3)
Total Bilirubin: 0.1 mg/dL — ABNORMAL LOW (ref 0.3–1.2)

## 2014-11-19 LAB — GLUCOSE, CAPILLARY
GLUCOSE-CAPILLARY: 131 mg/dL — AB (ref 70–99)
Glucose-Capillary: 198 mg/dL — ABNORMAL HIGH (ref 70–99)

## 2014-11-19 MED ORDER — AMLODIPINE BESYLATE 5 MG PO TABS
5.0000 mg | ORAL_TABLET | Freq: Every day | ORAL | Status: DC
  Filled 2014-11-19 (×2): qty 1

## 2014-11-19 MED ORDER — DOCUSATE SODIUM 100 MG PO CAPS
100.0000 mg | ORAL_CAPSULE | Freq: Two times a day (BID) | ORAL | Status: DC
  Administered 2014-11-20 – 2014-11-25 (×8): 100 mg via ORAL
  Filled 2014-11-19 (×15): qty 1

## 2014-11-19 MED ORDER — ALUM & MAG HYDROXIDE-SIMETH 200-200-20 MG/5ML PO SUSP: 30.0000 mL | Freq: Four times a day (QID) | ORAL | Status: DC | PRN

## 2014-11-19 MED ORDER — ROSUVASTATIN CALCIUM 10 MG PO TABS
10.0000 mg | ORAL_TABLET | Freq: Every day | ORAL | Status: DC
  Administered 2014-11-19 – 2014-11-25 (×7): 10 mg via ORAL
  Filled 2014-11-19 (×7): qty 1

## 2014-11-19 MED ORDER — HEPARIN SODIUM (PORCINE) 5000 UNIT/ML IJ SOLN
5000.0000 [IU] | Freq: Three times a day (TID) | INTRAMUSCULAR | Status: DC
  Administered 2014-11-20 – 2014-11-25 (×10): 5000 [IU] via SUBCUTANEOUS
  Filled 2014-11-19 (×19): qty 1

## 2014-11-19 MED ORDER — LOSARTAN POTASSIUM-HCTZ 100-12.5 MG PO TABS: 1.0000 | ORAL_TABLET | Freq: Every day | ORAL | Status: DC

## 2014-11-19 MED ORDER — POTASSIUM CHLORIDE CRYS ER 10 MEQ PO TBCR
10.0000 meq | EXTENDED_RELEASE_TABLET | Freq: Every day | ORAL | Status: DC
  Administered 2014-11-20 – 2014-11-25 (×6): 10 meq via ORAL
  Filled 2014-11-19 (×7): qty 1

## 2014-11-19 MED ORDER — INSULIN ASPART 100 UNIT/ML ~~LOC~~ SOLN
0.0000 [IU] | Freq: Three times a day (TID) | SUBCUTANEOUS | Status: DC
  Administered 2014-11-20: 3 [IU] via SUBCUTANEOUS
  Administered 2014-11-20: 7 [IU] via SUBCUTANEOUS
  Administered 2014-11-21: 4 [IU] via SUBCUTANEOUS
  Administered 2014-11-21 (×2): 3 [IU] via SUBCUTANEOUS
  Administered 2014-11-22 – 2014-11-23 (×2): 7 [IU] via SUBCUTANEOUS
  Administered 2014-11-23: 4 [IU] via SUBCUTANEOUS
  Administered 2014-11-24 – 2014-11-25 (×3): 3 [IU] via SUBCUTANEOUS

## 2014-11-19 MED ORDER — PANTOPRAZOLE SODIUM 40 MG PO TBEC
40.0000 mg | DELAYED_RELEASE_TABLET | Freq: Every day | ORAL | Status: DC
  Administered 2014-11-20 – 2014-11-25 (×6): 40 mg via ORAL
  Filled 2014-11-19 (×4): qty 1

## 2014-11-19 MED ORDER — ONDANSETRON HCL 4 MG/2ML IJ SOLN: 4.0000 mg | Freq: Four times a day (QID) | INTRAMUSCULAR | Status: DC | PRN

## 2014-11-19 MED ORDER — METFORMIN HCL 500 MG PO TABS
500.0000 mg | ORAL_TABLET | Freq: Two times a day (BID) | ORAL | Status: DC
  Administered 2014-11-20 – 2014-11-25 (×11): 500 mg via ORAL
  Filled 2014-11-19 (×14): qty 1

## 2014-11-19 MED ORDER — CEFTRIAXONE SODIUM IN DEXTROSE 20 MG/ML IV SOLN
1.0000 g | INTRAVENOUS | Status: DC
  Administered 2014-11-19 – 2014-11-24 (×6): 1 g via INTRAVENOUS
  Filled 2014-11-19 (×8): qty 50

## 2014-11-19 MED ORDER — BENZONATATE 100 MG PO CAPS
100.0000 mg | ORAL_CAPSULE | Freq: Three times a day (TID) | ORAL | Status: DC | PRN
  Administered 2014-11-19 – 2014-11-21 (×3): 100 mg via ORAL
  Filled 2014-11-19 (×5): qty 1

## 2014-11-19 MED ORDER — ADULT MULTIVITAMIN W/MINERALS CH
1.0000 | ORAL_TABLET | Freq: Every day | ORAL | Status: DC
  Administered 2014-11-19 – 2014-11-25 (×7): 1 via ORAL
  Filled 2014-11-19 (×7): qty 1

## 2014-11-19 MED ORDER — ASPIRIN EC 81 MG PO TBEC
81.0000 mg | DELAYED_RELEASE_TABLET | Freq: Every day | ORAL | Status: DC
  Administered 2014-11-20 – 2014-11-25 (×6): 81 mg via ORAL
  Filled 2014-11-19 (×6): qty 1

## 2014-11-19 MED ORDER — VITAMIN D3 25 MCG (1000 UNIT) PO TABS
1000.0000 [IU] | ORAL_TABLET | Freq: Every day | ORAL | Status: DC
  Administered 2014-11-19 – 2014-11-25 (×7): 1000 [IU] via ORAL
  Filled 2014-11-19 (×7): qty 1

## 2014-11-19 MED ORDER — ONDANSETRON HCL 4 MG PO TABS: 4.0000 mg | ORAL_TABLET | Freq: Four times a day (QID) | ORAL | Status: DC | PRN

## 2014-11-19 MED ORDER — METOPROLOL TARTRATE 25 MG PO TABS
25.0000 mg | ORAL_TABLET | Freq: Two times a day (BID) | ORAL | Status: DC
  Administered 2014-11-20 – 2014-11-25 (×9): 25 mg via ORAL
  Filled 2014-11-19 (×13): qty 1

## 2014-11-19 MED ORDER — HYDROCHLOROTHIAZIDE 12.5 MG PO CAPS
12.5000 mg | ORAL_CAPSULE | Freq: Every day | ORAL | Status: DC
  Administered 2014-11-19 – 2014-11-25 (×5): 12.5 mg via ORAL
  Filled 2014-11-19 (×7): qty 1

## 2014-11-19 MED ORDER — LOSARTAN POTASSIUM 50 MG PO TABS
100.0000 mg | ORAL_TABLET | Freq: Every day | ORAL | Status: DC
  Administered 2014-11-20 – 2014-11-25 (×6): 100 mg via ORAL
  Filled 2014-11-19 (×7): qty 2

## 2014-11-19 MED ORDER — TRAMADOL HCL 50 MG PO TABS
50.0000 mg | ORAL_TABLET | Freq: Four times a day (QID) | ORAL | Status: DC | PRN
  Administered 2014-11-19 – 2014-11-21 (×4): 50 mg via ORAL
  Filled 2014-11-19 (×5): qty 1

## 2014-11-19 MED ORDER — SODIUM CHLORIDE 0.9 % IV SOLN
INTRAVENOUS | Status: DC
Start: 2014-11-19 — End: 2014-11-25
  Administered 2014-11-19 – 2014-11-22 (×4): via INTRAVENOUS

## 2014-11-19 NOTE — H&P (Signed)
Referring Physician:  Semya F White is an 68 y.o. female.                       Chief Complaint: Cough and shortness of breath.  HPI: 68 year old with 4 day history of cough and cold not responding to medications. Also had similar symptoms 2 weeks ago treated with Amoxicillin and Tessalon pearls. Short of breath and wheezing with and without activity. PMH of DM, II, Hypertension, Neck and Low back pain, Obesity and GERD. No fever. Some chest pain with cough.  Past Medical History  Diagnosis Date  . Diabetes mellitus   . Hypertension   . GERD (gastroesophageal reflux disease)   . Herniated disc   . Arthritis   . Shortness of breath on exertion 11/10/11    "sometimes"  . H/O hiatal hernia   . Headache(784.0)   . Hemorrhoids   . Chronic back pain greater than 3 months duration   . Vertigo   . Chronic neck pain   . Hx of echocardiogram 2010    normal EF      Past Surgical History  Procedure Laterality Date  . Knee surgery      right  . Shoulder open rotator cuff repair  07/20/2011    left  . Fracture surgery  2004?    right shoulder  . Knee arthroscopy  2004    right  . Tonsillectomy    . Cholecystectomy  ~ 2008  . Abdominal hysterectomy  1997?    "partial"  . Neck fusion      No family history on file. Social History:  reports that she has been smoking Cigarettes.  She has a 8 pack-year smoking history. She has never used smokeless tobacco. She reports that she does not drink alcohol or use illicit drugs.  Allergies:  Allergies  Allergen Reactions  . Percocet [Oxycodone-Acetaminophen] Itching    Medications Prior to Admission  Medication Sig Dispense Refill  . amLODipine (NORVASC) 5 MG tablet Take 5 mg by mouth daily.    . benzonatate (TESSALON) 100 MG capsule Take 1 capsule (100 mg total) by mouth 3 (three) times daily as needed for cough. 15 capsule 0  . cholecalciferol (VITAMIN D) 1000 UNITS tablet Take 1,000 Units by mouth daily.    Marland Kitchen esomeprazole (NEXIUM) 40  MG capsule Take 40 mg by mouth daily at 12 noon.    Marland Kitchen losartan-hydrochlorothiazide (HYZAAR) 100-12.5 MG per tablet Take 1 tablet by mouth daily.      . metFORMIN (GLUCOPHAGE) 500 MG tablet Take 500 mg by mouth 2 (two) times daily.    . metoprolol tartrate (LOPRESSOR) 25 MG tablet Take 25 mg by mouth 2 (two) times daily.    . potassium chloride (KLOR-CON) 10 MEQ CR tablet Take 20 mEq by mouth daily.     . predniSONE (DELTASONE) 20 MG tablet Take 2 tablets (40 mg total) by mouth daily. Start 11/18/2014 10 tablet 0  . rosuvastatin (CRESTOR) 10 MG tablet Take 10 mg by mouth daily.    . traMADol (ULTRAM) 50 MG tablet Take 1 tablet (50 mg total) by mouth every 6 (six) hours as needed. 15 tablet 0    Results for orders placed or performed during the hospital encounter of 11/19/14 (from the past 48 hour(s))  Glucose, capillary     Status: Abnormal   Collection Time: 11/19/14  6:01 PM  Result Value Ref Range   Glucose-Capillary 198 (H) 70 - 99 mg/dL  No results found.  Review Of Systems Constitutional: Negative for fever and chills.  Pulmonary: Positive for Asthma, COPD, Cough. Gastrointestinal: Negative for nausea, vomiting, abdominal pain and diarrhea.  Genitourinary: Positive for frequency. Negative for dysuria, hematuria and decreased urine volume.  Skin: Negative for rash and wound.  Neurological: Negative for weakness and numbness.   Blood pressure 156/59, pulse 65, temperature 98.2 F (36.8 C), temperature source Oral, resp. rate 16, height 5\' 7"  (1.702 m), weight 104.418 kg (230 lb 3.2 oz), SpO2 95 %. General appearance: alert, cooperative and appears stated age Head: Normocephalic, without obvious abnormality, atraumatic Eyes: Brown eyes, conjunctivae/corneas clear. PERRL, EOM's intact. Sclera-white Neck: No adenopathy, no carotid bruit, no JVD, supple, symmetrical, trachea midline and thyroid not enlarged, scar of surgery. Resp: Wheezing on auscultation bilaterally Cardio: regular  rate and rhythm, S1, S2 normal, II / VI systolic murmur,  GI: soft, non-tender; bowel sounds normal; no masses, no organomegaly. Scar of GB surgery. Extremities: extremities normal, atraumatic, no cyanosis or edema. + clubbing. Skin: Skin color, texture, turgor normal. No rashes or lesions Neurologic: Alert and oriented X 3, normal strength and tone. Normal coordination and gait. Psychiatry: Appears anxious.  Assessment/Plan  Acute Bronchitis R/O Pneumonia Anxiety Cervical disc disease DM, II Hypertension Obesity  Admit/CXR/Antibiotics  Jimya Ciani S, MD  11/19/2014, 6:05 PM

## 2014-11-19 NOTE — Progress Notes (Signed)
NURSING PROGRESS NOTE  Janaria AZAYLAH STAILEY [id number] Admission Data: 11/19/2014 6:06 PM Attending Provider: Birdie Riddle, MD XJD:[id number],MVVK S, MD Code Status: FULL   Labresha F Traxler is a 68 y.o. female patient admitted from ED:  -No acute distress noted.  -No complaints of shortness of breath.  -No complaints of chest pain.    Blood pressure 156/59, pulse 65, temperature 98.2 F (36.8 C), temperature source Oral, resp. rate 16, height 5\' 7"  (1.702 m), weight 104.418 kg (230 lb 3.2 oz), SpO2 95 %.   Allergies:  Percocet  Past Medical History:   has a past medical history of Diabetes mellitus; Hypertension; GERD (gastroesophageal reflux disease); Herniated disc; Arthritis; Shortness of breath on exertion (11/10/11); H/O hiatal hernia; Headache(784.0); Hemorrhoids; Chronic back pain greater than 3 months duration; Vertigo; Chronic neck pain; and echocardiogram (2010).  Past Surgical History:   has past surgical history that includes Knee surgery; Shoulder open rotator cuff repair (07/20/2011); Fracture surgery (2004?); Knee arthroscopy (2004); Tonsillectomy; Cholecystectomy (~ 2008); Abdominal hysterectomy (1997?); and neck fusion.  Skin: intact  Patient/Family orientated to room. Information packet given to patient/family. Admission inpatient armband information verified with patient/family to include name and date of birth and placed on patient arm. Side rails up x 2, fall assessment and education completed with patient/family. Patient/family able to verbalize understanding of risk associated with falls and verbalized understanding to call for assistance before getting out of bed. Call light within reach. Patient/family able to voice and demonstrate understanding of unit orientation instructions.    Will continue to evaluate and treat per MD orders.  Wallie Renshaw, RN

## 2014-11-20 ENCOUNTER — Encounter (HOSPITAL_COMMUNITY): Payer: Self-pay | Admitting: *Deleted

## 2014-11-20 LAB — BASIC METABOLIC PANEL
Anion gap: 11 (ref 5–15)
BUN: 13 mg/dL (ref 6–23)
CHLORIDE: 102 meq/L (ref 96–112)
CO2: 25 mmol/L (ref 19–32)
CREATININE: 1.1 mg/dL (ref 0.50–1.10)
Calcium: 8.9 mg/dL (ref 8.4–10.5)
GFR calc Af Amer: 59 mL/min — ABNORMAL LOW (ref >90)
GFR calc non Af Amer: 51 mL/min — ABNORMAL LOW (ref >90)
GLUCOSE: 97 mg/dL (ref 70–99)
Potassium: 3.7 mmol/L (ref 3.5–5.1)
Sodium: 138 mmol/L (ref 135–145)

## 2014-11-20 LAB — CBC
HCT: 36.1 % (ref 36.0–46.0)
Hemoglobin: 11.9 g/dL — ABNORMAL LOW (ref 12.0–15.0)
MCH: 29.1 pg (ref 26.0–34.0)
MCHC: 33 g/dL (ref 30.0–36.0)
MCV: 88.3 fL (ref 78.0–100.0)
Platelets: 318 10*3/uL (ref 150–400)
RBC: 4.09 MIL/uL (ref 3.87–5.11)
RDW: 13.3 % (ref 11.5–15.5)
WBC: 8.3 10*3/uL (ref 4.0–10.5)

## 2014-11-20 LAB — GLUCOSE, CAPILLARY
GLUCOSE-CAPILLARY: 113 mg/dL — AB (ref 70–99)
GLUCOSE-CAPILLARY: 196 mg/dL — AB (ref 70–99)
Glucose-Capillary: 137 mg/dL — ABNORMAL HIGH (ref 70–99)
Glucose-Capillary: 237 mg/dL — ABNORMAL HIGH (ref 70–99)

## 2014-11-20 LAB — HEMOGLOBIN A1C
Hgb A1c MFr Bld: 7 % — ABNORMAL HIGH (ref <5.7)
MEAN PLASMA GLUCOSE: 154 mg/dL — AB (ref <117)

## 2014-11-20 LAB — PROTIME-INR
INR: 0.98 (ref 0.00–1.49)
Prothrombin Time: 13.1 seconds (ref 11.6–15.2)

## 2014-11-20 MED ORDER — AMLODIPINE BESYLATE 5 MG PO TABS
5.0000 mg | ORAL_TABLET | Freq: Every day | ORAL | Status: DC
  Administered 2014-11-20 – 2014-11-24 (×5): 5 mg via ORAL
  Filled 2014-11-20 (×6): qty 1

## 2014-11-20 MED ORDER — PREDNISONE 20 MG PO TABS
20.0000 mg | ORAL_TABLET | Freq: Two times a day (BID) | ORAL | Status: AC
  Administered 2014-11-20 – 2014-11-23 (×6): 20 mg via ORAL
  Filled 2014-11-20 (×6): qty 1

## 2014-11-20 MED ORDER — HYDROMORPHONE HCL 1 MG/ML IJ SOLN
1.0000 mg | Freq: Once | INTRAMUSCULAR | Status: AC
  Administered 2014-11-20: 1 mg via INTRAVENOUS
  Filled 2014-11-20: qty 1

## 2014-11-20 MED ORDER — METHYLPREDNISOLONE SODIUM SUCC 125 MG IJ SOLR
60.0000 mg | Freq: Once | INTRAMUSCULAR | Status: AC
  Administered 2014-11-20: 60 mg via INTRAVENOUS

## 2014-11-20 NOTE — Progress Notes (Signed)
Patient complaining of pain of "6" "from coughing per patient.". No current prn orders. Paged Dr. Doylene Canard who gave telephone order for tramadol 50mg  PO q6h for moderate pain.

## 2014-11-20 NOTE — Progress Notes (Signed)
Patient complaining that PRN Ultram "not really helping with pain" Notified MD Doylene Canard. Orders received.

## 2014-11-20 NOTE — Progress Notes (Signed)
Utilization review completed. Ednamae Schiano, RN, BSN. 

## 2014-11-20 NOTE — Progress Notes (Signed)
Patient requesting to take amlodipine at 5PM instead of 10 AM. Pharmacy contacted to reschedule.

## 2014-11-20 NOTE — Progress Notes (Signed)
Ref: Kirsten Shinsato S, MD   Subjective:  Cough and wheezing not improving. Afebrile. No acute process on chest x-ray.  Objective:  Vital Signs in the last 24 hours: Temp:  [98.2 F (36.8 C)-98.3 F (36.8 C)] 98.3 F (36.8 C) (12/29 0553) Pulse Rate:  [57-65] 57 (12/29 0553) Cardiac Rhythm:  [-]  Resp:  [16-20] 20 (12/29 0553) BP: (115-156)/(54-60) 115/54 mmHg (12/29 1115) SpO2:  [95 %-99 %] 99 % (12/29 0553) Weight:  [104.418 kg (230 lb 3.2 oz)-105 kg (231 lb 7.7 oz)] 105 kg (231 lb 7.7 oz) (12/29 0500)  Physical Exam: BP Readings from Last 1 Encounters:  11/20/14 115/54    Wt Readings from Last 1 Encounters:  11/20/14 105 kg (231 lb 7.7 oz)    Weight change:   HEENT: [place]/AT, Eyes-Brown, PERL, EOMI, Conjunctiva-Pink, Sclera-Non-icteric Neck: No JVD, No bruit, Trachea midline. Lungs:  Wheezing, Bilateral. Cardiac:  Regular rhythm, normal S1 and S2, no S3. II/VI systolic murmur Abdomen:  Soft, non-tender. Extremities:  No edema present. No cyanosis. + clubbing. CNS: AxOx3, Cranial nerves grossly intact, moves all 4 extremities. Right handed. Skin: Warm and dry.   Intake/Output from previous day: 12/28 0701 - 12/29 0700 In: 600 [P.O.:600] Out: -     Lab Results: BMET    Component Value Date/Time   NA 138 11/20/2014 0317   NA 140 11/19/2014 2030   NA 138 02/11/2014 1150   K 3.7 11/20/2014 0317   K 3.7 11/19/2014 2030   K 3.8 02/11/2014 1150   CL 102 11/20/2014 0317   CL 106 11/19/2014 2030   CL 95* 02/11/2014 1150   CO2 25 11/20/2014 0317   CO2 23 11/19/2014 2030   CO2 29 02/11/2014 1150   GLUCOSE 97 11/20/2014 0317   GLUCOSE 150* 11/19/2014 2030   GLUCOSE 158* 02/11/2014 1150   BUN 13 11/20/2014 0317   BUN 12 11/19/2014 2030   BUN 12 02/11/2014 1150   CREATININE 1.10 11/20/2014 0317   CREATININE 1.16* 11/19/2014 2030   CREATININE 0.95 02/11/2014 1150   CALCIUM 8.9 11/20/2014 0317   CALCIUM 9.1 11/19/2014 2030   CALCIUM 9.6 02/11/2014 1150   GFRNONAA 51*  11/20/2014 0317   GFRNONAA 48* 11/19/2014 2030   GFRNONAA 61* 02/11/2014 1150   GFRAA 59* 11/20/2014 0317   GFRAA 55* 11/19/2014 2030   GFRAA 71* 02/11/2014 1150   CBC    Component Value Date/Time   WBC 8.3 11/20/2014 0317   RBC 4.09 11/20/2014 0317   HGB 11.9* 11/20/2014 0317   HCT 36.1 11/20/2014 0317   PLT 318 11/20/2014 0317   MCV 88.3 11/20/2014 0317   MCH 29.1 11/20/2014 0317   MCHC 33.0 11/20/2014 0317   RDW 13.3 11/20/2014 0317   LYMPHSABS 2.1 11/19/2014 2030   MONOABS 0.8 11/19/2014 2030   EOSABS 0.1 11/19/2014 2030   BASOSABS 0.0 11/19/2014 2030   HEPATIC Function Panel  Recent Labs  02/11/14 1150 11/19/14 2030  PROT 7.9 6.8   HEMOGLOBIN A1C No components found for: HGA1C,  MPG CARDIAC ENZYMES Lab Results  Component Value Date   CKTOTAL 95 01/01/2009   CKMB 0.8 01/01/2009   TROPONINI <0.01        NO INDICATION OF MYOCARDIAL INJURY. 01/01/2009   TROPONINI <0.01        NO INDICATION OF MYOCARDIAL INJURY. 01/01/2009   TROPONINI <0.01        NO INDICATION OF MYOCARDIAL INJURY. 12/31/2008   BNP No results for input(White): PROBNP in the last  8760 hours. TSH No results for input(White): TSH in the last 8760 hours. CHOLESTEROL No results for input(White): CHOL in the last 8760 hours.  Scheduled Meds: . amLODipine  5 mg Oral QAC supper  . aspirin EC  81 mg Oral Daily  . cefTRIAXone (ROCEPHIN)  IV  1 g Intravenous Q24H  . cholecalciferol  1,000 Units Oral Daily  . docusate sodium  100 mg Oral BID  . heparin  5,000 Units Subcutaneous 3 times per day  . hydrochlorothiazide  12.5 mg Oral Daily  . insulin aspart  0-20 Units Subcutaneous TID WC  . losartan  100 mg Oral Daily  . metFORMIN  500 mg Oral BID WC  . methylPREDNISolone (SOLU-MEDROL) injection  60 mg Intravenous Once  . metoprolol tartrate  25 mg Oral BID  . multivitamin with minerals  1 tablet Oral Daily  . pantoprazole  40 mg Oral QAC breakfast  . potassium chloride  10 mEq Oral Daily  . predniSONE   20 mg Oral BID WC  . rosuvastatin  10 mg Oral Daily   Continuous Infusions: . sodium chloride 50 mL/hr at 11/19/14 2250   PRN Meds:.alum & mag hydroxide-simeth, benzonatate, ondansetron **OR** ondansetron (ZOFRAN) IV, traMADol  Assessment/Plan: Acute exacerbation of Bronchitis/Asthma Pneumonia ruled out Anxiety Cervical disc disease DM, II Hypertension Obesity  Add Solumedrol/Prednisone    LOS: 1 day    Dixie Dials  MD  11/20/2014, 12:03 PM

## 2014-11-21 LAB — GLUCOSE, CAPILLARY
GLUCOSE-CAPILLARY: 171 mg/dL — AB (ref 70–99)
GLUCOSE-CAPILLARY: 160 mg/dL — AB (ref 70–99)
Glucose-Capillary: 137 mg/dL — ABNORMAL HIGH (ref 70–99)
Glucose-Capillary: 127 mg/dL — ABNORMAL HIGH (ref 70–99)

## 2014-11-21 MED ORDER — HYDROCODONE-ACETAMINOPHEN 5-325 MG PO TABS
1.0000 | ORAL_TABLET | Freq: Four times a day (QID) | ORAL | Status: DC | PRN
  Administered 2014-11-21 – 2014-11-24 (×3): 1 via ORAL
  Filled 2014-11-21 (×3): qty 1

## 2014-11-21 MED ORDER — AZITHROMYCIN 500 MG PO TABS
500.0000 mg | ORAL_TABLET | Freq: Every day | ORAL | Status: AC
  Administered 2014-11-21: 500 mg via ORAL
  Filled 2014-11-21 (×2): qty 1

## 2014-11-21 MED ORDER — GUAIFENESIN ER 600 MG PO TB12
600.0000 mg | ORAL_TABLET | Freq: Two times a day (BID) | ORAL | Status: DC
  Administered 2014-11-21 – 2014-11-25 (×9): 600 mg via ORAL
  Filled 2014-11-21 (×11): qty 1

## 2014-11-21 MED ORDER — AZITHROMYCIN 250 MG PO TABS
250.0000 mg | ORAL_TABLET | Freq: Every day | ORAL | Status: AC
  Administered 2014-11-22 – 2014-11-25 (×4): 250 mg via ORAL
  Filled 2014-11-21 (×5): qty 1

## 2014-11-21 NOTE — Progress Notes (Signed)
Ref: Birdie Riddle, MD   Subjective:  Feeling same. Wheezing and coughing continues. Vital signs are stable.  Objective:  Vital Signs in the last 24 hours: Temp:  [98 F (36.7 C)-98.3 F (36.8 C)] 98 F (36.7 C) (12/30 0605) Pulse Rate:  [59-64] 59 (12/30 0605) Cardiac Rhythm:  [-]  Resp:  [15-20] 15 (12/30 0605) BP: (115-145)/(54-76) 128/58 mmHg (12/30 0605) SpO2:  [96 %-97 %] 97 % (12/30 0605) Weight:  [103.9 kg (229 lb 0.9 oz)] 103.9 kg (229 lb 0.9 oz) (12/30 5397)  Physical Exam: BP Readings from Last 1 Encounters:  11/21/14 128/58    Wt Readings from Last 1 Encounters:  11/21/14 103.9 kg (229 lb 0.9 oz)    Weight change: -0.518 kg (-1 lb 2.3 oz)  HEENT: [place]/AT, Eyes-Brown, PERL, EOMI, Conjunctiva-Pink, Sclera-Non-icteric Neck: No JVD, No bruit, Trachea midline. Lungs:  Wheezing, Bilateral. Cardiac:  Regular rhythm, normal S1 and S2, no S3. II/VI systolic murmur. Abdomen:  Soft, non-tender. Extremities:  No edema present. No cyanosis. No clubbing. CNS: AxOx3, Cranial nerves grossly intact, moves all 4 extremities. Right handed. Skin: Warm and dry.   Intake/Output from previous day: 12/29 0701 - 12/30 0700 In: 882 [P.O.:882] Out: 500 [Urine:500]    Lab Results: BMET    Component Value Date/Time   NA 138 11/20/2014 0317   NA 140 11/19/2014 2030   NA 138 02/11/2014 1150   K 3.7 11/20/2014 0317   K 3.7 11/19/2014 2030   K 3.8 02/11/2014 1150   CL 102 11/20/2014 0317   CL 106 11/19/2014 2030   CL 95* 02/11/2014 1150   CO2 25 11/20/2014 0317   CO2 23 11/19/2014 2030   CO2 29 02/11/2014 1150   GLUCOSE 97 11/20/2014 0317   GLUCOSE 150* 11/19/2014 2030   GLUCOSE 158* 02/11/2014 1150   BUN 13 11/20/2014 0317   BUN 12 11/19/2014 2030   BUN 12 02/11/2014 1150   CREATININE 1.10 11/20/2014 0317   CREATININE 1.16* 11/19/2014 2030   CREATININE 0.95 02/11/2014 1150   CALCIUM 8.9 11/20/2014 0317   CALCIUM 9.1 11/19/2014 2030   CALCIUM 9.6 02/11/2014 1150   GFRNONAA 51* 11/20/2014 0317   GFRNONAA 48* 11/19/2014 2030   GFRNONAA 61* 02/11/2014 1150   GFRAA 59* 11/20/2014 0317   GFRAA 55* 11/19/2014 2030   GFRAA 71* 02/11/2014 1150   CBC    Component Value Date/Time   WBC 8.3 11/20/2014 0317   RBC 4.09 11/20/2014 0317   HGB 11.9* 11/20/2014 0317   HCT 36.1 11/20/2014 0317   PLT 318 11/20/2014 0317   MCV 88.3 11/20/2014 0317   MCH 29.1 11/20/2014 0317   MCHC 33.0 11/20/2014 0317   RDW 13.3 11/20/2014 0317   LYMPHSABS 2.1 11/19/2014 2030   MONOABS 0.8 11/19/2014 2030   EOSABS 0.1 11/19/2014 2030   BASOSABS 0.0 11/19/2014 2030   HEPATIC Function Panel  Recent Labs  02/11/14 1150 11/19/14 2030  PROT 7.9 6.8   HEMOGLOBIN A1C No components found for: HGA1C,  MPG CARDIAC ENZYMES Lab Results  Component Value Date   CKTOTAL 95 01/01/2009   CKMB 0.8 01/01/2009   TROPONINI <0.01        NO INDICATION OF MYOCARDIAL INJURY. 01/01/2009   TROPONINI <0.01        NO INDICATION OF MYOCARDIAL INJURY. 01/01/2009   TROPONINI <0.01        NO INDICATION OF MYOCARDIAL INJURY. 12/31/2008   BNP No results for input(s): PROBNP in the last 8760 hours. TSH  No results for input(s): TSH in the last 8760 hours. CHOLESTEROL No results for input(s): CHOL in the last 8760 hours.  Scheduled Meds: . amLODipine  5 mg Oral QAC supper  . aspirin EC  81 mg Oral Daily  . cefTRIAXone (ROCEPHIN)  IV  1 g Intravenous Q24H  . cholecalciferol  1,000 Units Oral Daily  . docusate sodium  100 mg Oral BID  . guaiFENesin  600 mg Oral BID  . heparin  5,000 Units Subcutaneous 3 times per day  . hydrochlorothiazide  12.5 mg Oral Daily  . insulin aspart  0-20 Units Subcutaneous TID WC  . losartan  100 mg Oral Daily  . metFORMIN  500 mg Oral BID WC  . metoprolol tartrate  25 mg Oral BID  . multivitamin with minerals  1 tablet Oral Daily  . pantoprazole  40 mg Oral QAC breakfast  . potassium chloride  10 mEq Oral Daily  . predniSONE  20 mg Oral BID WC  .  rosuvastatin  10 mg Oral Daily   Continuous Infusions: . sodium chloride 50 mL/hr at 11/20/14 1821   PRN Meds:.alum & mag hydroxide-simeth, ondansetron **OR** ondansetron (ZOFRAN) IV, traMADol  Assessment/Plan: Acute exacerbation of Bronchitis/Asthma Pneumonia ruled out Anxiety Cervical disc disease DM, II Hypertension Obesity  Add mucinex.     LOS: 2 days    Dixie Dials  MD  11/21/2014, 8:35 AM

## 2014-11-22 LAB — GLUCOSE, CAPILLARY
GLUCOSE-CAPILLARY: 245 mg/dL — AB (ref 70–99)
Glucose-Capillary: 108 mg/dL — ABNORMAL HIGH (ref 70–99)
Glucose-Capillary: 122 mg/dL — ABNORMAL HIGH (ref 70–99)
Glucose-Capillary: 150 mg/dL — ABNORMAL HIGH (ref 70–99)

## 2014-11-22 MED ORDER — LEVALBUTEROL HCL 1.25 MG/0.5ML IN NEBU
1.2500 mg | INHALATION_SOLUTION | Freq: Three times a day (TID) | RESPIRATORY_TRACT | Status: DC
  Administered 2014-11-22 – 2014-11-24 (×5): 1.25 mg via RESPIRATORY_TRACT
  Filled 2014-11-22 (×9): qty 0.5

## 2014-11-22 NOTE — Progress Notes (Signed)
ThSubjective:  Patient complains of cough chest congestion and wheezing. Denies any fever or chills. States overall feels little better.  Objective:  Vital Signs in the last 24 hours: Temp:  [97.9 F (36.6 C)-99 F (37.2 C)] 99 F (37.2 C) (12/31 1324) Pulse Rate:  [56-60] 60 (12/31 0534) Resp:  [15-20] 16 (12/31 1324) BP: (126-145)/(57-67) 145/67 mmHg (12/31 1324) SpO2:  [97 %-100 %] 97 % (12/31 1324) Weight:  [104.645 kg (230 lb 11.2 oz)] 104.645 kg (230 lb 11.2 oz) (12/31 0526)  Intake/Output from previous day: 12/30 0701 - 12/31 0700 In: 1718.3 [P.O.:720; I.V.:948.3; IV Piggyback:50] Out: -  Intake/Output from this shift: Total I/O In: 360 [P.O.:360] Out: -   Physical Exam: Neck: no adenopathy, no carotid bruit, no JVD and supple, symmetrical, trachea midline Lungs: Bilateral expiratory wheezing noted Heart: regular rate and rhythm, S1, S2 normal and 2/6 systolic murmur noted Abdomen: soft, non-tender; bowel sounds normal; no masses,  no organomegaly Extremities: extremities normal, atraumatic, no cyanosis or edema  Lab Results:  Recent Labs  11/19/14 2030 11/20/14 0317  WBC 8.1 8.3  HGB 13.1 11.9*  PLT 336 318    Recent Labs  11/19/14 2030 11/20/14 0317  NA 140 138  K 3.7 3.7  CL 106 102  CO2 23 25  GLUCOSE 150* 97  BUN 12 13  CREATININE 1.16* 1.10   No results for input(s): TROPONINI in the last 72 hours.  Invalid input(s): CK, MB Hepatic Function Panel  Recent Labs  11/19/14 2030  PROT 6.8  ALBUMIN 3.5  AST 24  ALT 15  ALKPHOS 120*  BILITOT 0.1*   No results for input(s): CHOL in the last 72 hours. No results for input(s): PROTIME in the last 72 hours.  Imaging: Imaging results have been reviewed and No results found.  Cardiac Studies:  Assessment/Plan:  Acute exacerbation of asthmatic bronchitis Hypertension Diabetes mellitus Morbid obesity Degenerative joint disease Anxiety disorder Plan Start xopenex  by nebulizer as per  orders Check labs in a.m.   LOS: 3 days    Kumari Sculley N 11/22/2014, 2:16 PM

## 2014-11-23 LAB — BASIC METABOLIC PANEL
Anion gap: 11 (ref 5–15)
BUN: 18 mg/dL (ref 6–23)
CO2: 25 mmol/L (ref 19–32)
Calcium: 9.2 mg/dL (ref 8.4–10.5)
Chloride: 104 mEq/L (ref 96–112)
Creatinine, Ser: 1.13 mg/dL — ABNORMAL HIGH (ref 0.50–1.10)
GFR calc non Af Amer: 49 mL/min — ABNORMAL LOW (ref >90)
GFR, EST AFRICAN AMERICAN: 57 mL/min — AB (ref >90)
Glucose, Bld: 127 mg/dL — ABNORMAL HIGH (ref 70–99)
Potassium: 3.6 mmol/L (ref 3.5–5.1)
Sodium: 140 mmol/L (ref 135–145)

## 2014-11-23 LAB — CBC
HCT: 39.8 % (ref 36.0–46.0)
Hemoglobin: 13.2 g/dL (ref 12.0–15.0)
MCH: 29.1 pg (ref 26.0–34.0)
MCHC: 33.2 g/dL (ref 30.0–36.0)
MCV: 87.7 fL (ref 78.0–100.0)
PLATELETS: 323 10*3/uL (ref 150–400)
RBC: 4.54 MIL/uL (ref 3.87–5.11)
RDW: 13 % (ref 11.5–15.5)
WBC: 12 10*3/uL — AB (ref 4.0–10.5)

## 2014-11-23 LAB — GLUCOSE, CAPILLARY
GLUCOSE-CAPILLARY: 211 mg/dL — AB (ref 70–99)
Glucose-Capillary: 119 mg/dL — ABNORMAL HIGH (ref 70–99)
Glucose-Capillary: 195 mg/dL — ABNORMAL HIGH (ref 70–99)
Glucose-Capillary: 149 mg/dL — ABNORMAL HIGH (ref 70–99)

## 2014-11-23 LAB — BRAIN NATRIURETIC PEPTIDE: B Natriuretic Peptide: 184.1 pg/mL — ABNORMAL HIGH (ref 0.0–100.0)

## 2014-11-23 NOTE — Progress Notes (Signed)
Subjective:   patient complains of cough and chest congestion states wheezing has improved overall feels little better.    Objective:  Vital Signs in the last 24 hours: Temp:  [98.1 F (36.7 C)-99 F (37.2 C)] 98.1 F (36.7 C) (12/31 2152) Pulse Rate:  [55-60] 60 (12/31 2152) Resp:  [16] 16 (12/31 2152) BP: (137-145)/(53-67) 144/61 mmHg (12/31 2238) SpO2:  [97 %-98 %] 98 % (12/31 2152) Weight:  [103.5 kg (228 lb 2.8 oz)] 103.5 kg (228 lb 2.8 oz) (01/01 0500)  Intake/Output from previous day: 12/31 0701 - 01/01 0700 In: 2983.3 [P.O.:1600; I.V.:1383.3] Out: -  Intake/Output from this shift:    Physical Exam: Neck: no adenopathy, no carotid bruit, no JVD and supple, symmetrical, trachea midline Lungs: Decreased breath sound at bases with bilateral expiratory wheezing improved Heart: regular rate and rhythm, S1, S2 normal and 2/6 systolic murmur noted Abdomen: soft, non-tender; bowel sounds normal; no masses,  no organomegaly Extremities: extremities normal, atraumatic, no cyanosis or edema  Lab Results:  Recent Labs  11/23/14 0558  WBC 12.0*  HGB 13.2  PLT 323    Recent Labs  11/23/14 0558  NA 140  K 3.6  CL 104  CO2 25  GLUCOSE 127*  BUN 18  CREATININE 1.13*   No results for input(s): TROPONINI in the last 72 hours.  Invalid input(s): CK, MB Hepatic Function Panel No results for input(s): PROT, ALBUMIN, AST, ALT, ALKPHOS, BILITOT, BILIDIR, IBILI in the last 72 hours. No results for input(s): CHOL in the last 72 hours. No results for input(s): PROTIME in the last 72 hours.  Imaging: Imaging results have been reviewed and No results found.  Cardiac Studies:  Assessment/Plan:  Acute exacerbation of asthmatic bronchitis Hypertension Diabetes mellitus Morbid obesity Degenerative joint disease Anxiety disorder Mild volume overload Plan Continue present management Reduce IV fluids to KVO  LOS: 4 days    Brinlynn Gorton N 11/23/2014, 8:30 AM

## 2014-11-24 LAB — GLUCOSE, CAPILLARY
GLUCOSE-CAPILLARY: 147 mg/dL — AB (ref 70–99)
GLUCOSE-CAPILLARY: 115 mg/dL — AB (ref 70–99)
Glucose-Capillary: 125 mg/dL — ABNORMAL HIGH (ref 70–99)
Glucose-Capillary: 142 mg/dL — ABNORMAL HIGH (ref 70–99)
Glucose-Capillary: 97 mg/dL (ref 70–99)

## 2014-11-24 MED ORDER — LEVALBUTEROL HCL 1.25 MG/0.5ML IN NEBU
1.2500 mg | INHALATION_SOLUTION | Freq: Two times a day (BID) | RESPIRATORY_TRACT | Status: DC
  Administered 2014-11-24 – 2014-11-25 (×2): 1.25 mg via RESPIRATORY_TRACT
  Filled 2014-11-24 (×4): qty 0.5

## 2014-11-24 MED ORDER — BUDESONIDE-FORMOTEROL FUMARATE 160-4.5 MCG/ACT IN AERO
2.0000 | INHALATION_SPRAY | Freq: Two times a day (BID) | RESPIRATORY_TRACT | Status: DC
  Administered 2014-11-24 – 2014-11-25 (×2): 2 via RESPIRATORY_TRACT
  Filled 2014-11-24: qty 6

## 2014-11-24 NOTE — Progress Notes (Signed)
Subjective:  Patient complained of pleuritic chest pain earlier no anginal pain. States overall feeling area but not ready to go home today. Cough chest condition and wheezing as markedly improved. Denies fever or chills  Objective:  Vital Signs in the last 24 hours: Temp:  [98 F (36.7 C)-98.1 F (36.7 C)] 98 F (36.7 C) (01/02 3818) Pulse Rate:  [50-60] 57 (01/02 0953) Resp:  [16-18] 18 (01/02 0613) BP: (114-162)/(51-63) 132/58 mmHg (01/02 0953) SpO2:  [97 %-100 %] 100 % (01/02 0857) Weight:  [103.4 kg (227 lb 15.3 oz)] 103.4 kg (227 lb 15.3 oz) (01/02 2993)  Intake/Output from previous day: 01/01 0701 - 01/02 0700 In: 1440 [P.O.:1440] Out: -  Intake/Output from this shift: Total I/O In: 120 [P.O.:120] Out: -   Physical Exam: Neck: no adenopathy, no carotid bruit, no JVD and supple, symmetrical, trachea midline Lungs: Decreased breath sound at bases with mild expiratory wheezing Heart: regular rate and rhythm, S1, S2 normal and 2/6 systolic murmur noted Abdomen: soft, non-tender; bowel sounds normal; no masses,  no organomegaly Extremities: extremities normal, atraumatic, no cyanosis or edema  Lab Results:  Recent Labs  11/23/14 0558  WBC 12.0*  HGB 13.2  PLT 323    Recent Labs  11/23/14 0558  NA 140  K 3.6  CL 104  CO2 25  GLUCOSE 127*  BUN 18  CREATININE 1.13*   No results for input(s): TROPONINI in the last 72 hours.  Invalid input(s): CK, MB Hepatic Function Panel No results for input(s): PROT, ALBUMIN, AST, ALT, ALKPHOS, BILITOT, BILIDIR, IBILI in the last 72 hours. No results for input(s): CHOL in the last 72 hours. No results for input(s): PROTIME in the last 72 hours.  Imaging: Imaging results have been reviewed and No results found.  Cardiac Studies:  Assessment/Plan:  Resolving Acute exacerbation of asthmatic bronchitis Hypertension Diabetes mellitus Morbid obesity Degenerative joint disease Anxiety disorder Mild volume  overload Plan As per orders  LOS: 5 days     [name] N 11/24/2014, 10:23 AM

## 2014-11-24 NOTE — Progress Notes (Signed)
Patient complained of sudden pain shooting from left neck/jaw to head and large beads of sweat coming down her neck and face.  Also complained of tightness in chest starting at 8/10 and now 5/10.  VS showed elevated SBP in 160s. CBG obtained with result of 125.  EKG obtained showing Sinus Brady/borderline EKG. MD notified.  No new orders.  MD stated that he thought it was pleuritic pain. Pain meds given; will continue to monitor patient.

## 2014-11-25 LAB — GLUCOSE, CAPILLARY
GLUCOSE-CAPILLARY: 148 mg/dL — AB (ref 70–99)
Glucose-Capillary: 144 mg/dL — ABNORMAL HIGH (ref 70–99)

## 2014-11-25 MED ORDER — BUDESONIDE-FORMOTEROL FUMARATE 160-4.5 MCG/ACT IN AERO
2.0000 | INHALATION_SPRAY | Freq: Two times a day (BID) | RESPIRATORY_TRACT | Status: DC
Start: 2014-11-25 — End: 2015-03-21

## 2014-11-25 MED ORDER — PREDNISONE 20 MG PO TABS: 20.0000 mg | ORAL_TABLET | Freq: Every day | ORAL | Status: DC

## 2014-11-25 NOTE — Discharge Instructions (Signed)
Asthma Asthma is a recurring condition in which the airways tighten and narrow. Asthma can make it difficult to breathe. It can cause coughing, wheezing, and shortness of breath. Asthma episodes, also called asthma attacks, range from minor to life-threatening. Asthma cannot be cured, but medicines and lifestyle changes can help control it. CAUSES Asthma is believed to be caused by inherited (genetic) and environmental factors, but its exact cause is unknown. Asthma Chesney be triggered by allergens, lung infections, or irritants in the air. Asthma triggers are different for each person. Common triggers include:   Animal dander.  Dust mites.  Cockroaches.  Pollen from trees or grass.  Mold.  Smoke.  Air pollutants such as dust, household cleaners, hair sprays, aerosol sprays, paint fumes, strong chemicals, or strong odors.  Cold air, weather changes, and winds (which increase molds and pollens in the air).  Strong emotional expressions such as crying or laughing hard.  Stress.  Certain medicines (such as aspirin) or types of drugs (such as beta-blockers).  Sulfites in foods and drinks. Foods and drinks that Marigr[place] contain sulfites include dried fruit, potato chips, and sparkling grape juice.  Infections or inflammatory conditions such as the flu, a cold, or an inflammation of the nasal membranes (rhinitis).  Gastroesophageal reflux disease (GERD).  Exercise or strenuous activity. SYMPTOMS Symptoms Aronda occur immediately after asthma is triggered or many hours later. Symptoms include:  Wheezing.  Excessive nighttime or early morning coughing.  Frequent or severe coughing with a common cold.  Chest tightness.  Shortness of breath. DIAGNOSIS  The diagnosis of asthma is made by a review of your medical history and a physical exam. Tests Dawnelle also be performed. These Jodee include:  Lung function studies. These tests show how much air you breathe in and out.  Allergy  tests.  Imaging tests such as X-rays. TREATMENT  Asthma cannot be cured, but it can usually be controlled. Treatment involves identifying and avoiding your asthma triggers. It also involves medicines. There are 2 classes of medicine used for asthma treatment:   Controller medicines. These prevent asthma symptoms from occurring. They are usually taken every day.  Reliever or rescue medicines. These quickly relieve asthma symptoms. They are used as needed and provide short-term relief. Your health care provider will help you create an asthma action plan. An asthma action plan is a written plan for managing and treating your asthma attacks. It includes a list of your asthma triggers and how they [place] be avoided. It also includes information on when medicines should be taken and when their dosage should be changed. An action plan Kathleen also involve the use of a device called a peak flow meter. A peak flow meter measures how well the lungs are working. It helps you monitor your condition. HOME CARE INSTRUCTIONS   Take medicines only as directed by your health care provider. Speak with your health care provider if you have questions about how or when to take the medicines.  Use a peak flow meter as directed by your health care provider. Record and keep track of readings.  Understand and use the action plan to help minimize or stop an asthma attack without needing to seek medical care.  Control your home environment in the following ways to help prevent asthma attacks:  Do not smoke. Avoid being exposed to secondhand smoke.  Change your heating and air conditioning filter regularly.  Limit your use of fireplaces and wood stoves.  Get rid of pests (such as roaches and   mice) and their droppings.  Throw away plants if you see mold on them.  Clean your floors and dust regularly. Use unscented cleaning products.  Try to have someone else vacuum for you regularly. Stay out of rooms while they are  being vacuumed and for a short while afterward. If you vacuum, use a dust mask from a hardware store, a double-layered or microfilter vacuum cleaner bag, or a vacuum cleaner with a HEPA filter.  Replace carpet with wood, tile, or vinyl flooring. Carpet can trap dander and dust.  Use allergy-proof pillows, mattress covers, and box spring covers.  Wash bed sheets and blankets every week in hot water and dry them in a dryer.  Use blankets that are made of polyester or cotton.  Clean bathrooms and kitchens with bleach. If possible, have someone repaint the walls in these rooms with mold-resistant paint. Keep out of the rooms that are being cleaned and painted.  Wash hands frequently. SEEK MEDICAL CARE IF:   You have wheezing, shortness of breath, or a cough even if taking medicine to prevent attacks.  The colored mucus you cough up (sputum) is thicker than usual.  Your sputum changes from clear or white to yellow, green, gray, or bloody.  You have any problems that Arihana be related to the medicines you are taking (such as a rash, itching, swelling, or trouble breathing).  You are using a reliever medicine more than 2-3 times per week.  Your peak flow is still at 50-79% of your personal best after following your action plan for 1 hour.  You have a fever. SEEK IMMEDIATE MEDICAL CARE IF:   You seem to be getting worse and are unresponsive to treatment during an asthma attack.  You are short of breath even at rest.  You get short of breath when doing very little physical activity.  You have difficulty eating, drinking, or talking due to asthma symptoms.  You develop chest pain.  You develop a fast heartbeat.  You have a bluish color to your lips or fingernails.  You are light-headed, dizzy, or faint.  Your peak flow is less than 50% of your personal best. MAKE SURE YOU:   Understand these instructions.  Will watch your condition.  Will get help right away if you are not  doing well or get worse. Document Released: 11/09/2005 Document Revised: 03/26/2014 Document Reviewed: 06/08/2013 Exit[employer] Patient Information 2015 [employer]. This information is not intended to replace advice given to you by your health care provider. Make sure you discuss any questions you have with your health care provider.  

## 2014-11-25 NOTE — Discharge Summary (Signed)
[name], [name]                   ACCOUNT NO.:  [id number]  MEDICAL RECORD NO.:  [id number]  LOCATION:  5W35C                        FACILITY:  [facility]  PHYSICIAN:  Allegra Lai. Terrence Dupont, M.D. DATE OF BIRTH:  1945/12/21  DATE OF ADMISSION:  11/19/2014 DATE OF DISCHARGE:  11/25/2014                              DISCHARGE SUMMARY   ADMITTING DIAGNOSES: 1. Acute bronchitis, rule out pneumonia. 2. Hypertension. 3. Diabetes mellitus. 4. Anxiety disorder. 5. Cervical disk disease. 6. Morbid obesity.  DISCHARGE DIAGNOSES: 1. Status post acute asthmatic bronchitis. 2. Hypertension. 3. Diabetes mellitus. 4. Morbid obesity. 5. Degenerative joint disease. 6. Anxiety disorder. 7. Status post mild volume overload.  DISCHARGE MEDICATIONS: 1. Symbicort 2 puffs twice daily. 2. Xanax 0.25 mg 1 tablet twice daily as before. 3. Amlodipine 5 mg daily. 4. Tessalon capsule 100 mg 3 times daily. 5. Vitamin D 1000 units daily. 6. Flexeril 10 mg twice daily as needed for muscle spasm. 7. Nexium 40 mg one capsule daily. 8. Losartan 100 mg daily. 9. Metformin 500 mg twice daily. 10.Crestor 10 mg daily. 11.Metoprolol 25 mg twice daily. 12.Prednisone 20 mg daily.  DIET:  Low salt, low cholesterol 1800 calories ADA diet.  FOLLOWUP:  The patient has been advised to follow with Dr. Doylene Canard in 2 weeks.  CONDITION AT DISCHARGE:  Stable.  She will wean off prednisone as outpatient as instructed.  Condition on discharge is stable.  BRIEF HISTORY AND HOSPITAL COURSE:  [name] is a 69 year old female with past medical history significant for hypertension, diabetes mellitus, GERD, herniated disk, arthritis, history of hiatus hernia, was admitted by Dr. Doylene Canard on November 19, 2014 because of 4-day history of cough and cold, not responding to over-the-counter medication.  Also had similar symptoms 2 weeks ago treated with amoxicillin without improvement.  Also complains of shortness of breath associated  with wheezing.  PAST MEDICAL HISTORY:  As above.  PHYSICAL EXAMINATION:  GENERAL:  She was alert, awake, oriented x3. VITAL SIGNS:  Blood pressure was 156/59, pulse was 69.  She was afebrile. HEENT:  Conjunctivae were pink. NECK:  Supple.  No JVD.  No bruit. LUNGS:  She has wheezing on auscultation bilaterally. CARDIOVASCULAR:  S1, S2 was normal.  There was 2/6 systolic murmur. ABDOMEN:  Soft.  Bowel sounds were present.  Nontender. EXTREMITIES:  There was no clubbing, cyanosis, or edema. NEUROLOGIC:  Grossly intact.  LABORATORY DATA:  Sodium was 140, potassium 3.7, BUN 12, creatinine 1.16.  Hemoglobin was 13.1, hematocrit 39.5, white count of 8.1.  Her blood sugar was 150.  BNP was 184.  Chest x-ray showed no acute cardiopulmonary process.  BRIEF HOSPITAL COURSE:  The patient was admitted to regular floor, was started on IV Zosyn and Rocephin.  Pancultures were obtained.  The patient was also started on breathing treatment and steroids with improvement in her symptoms.  The patient remained afebrile during the hospital stay.  Her wheezing has completely resolved.  The patient is ambulating in room without any problems.  The patient will be discharged home on above medications and will be followed up by Dr. Doylene Canard in 2 weeks.     Allegra Lai. Terrence Dupont, M.D.  MNH/MEDQ  D:  11/25/2014  T:  11/25/2014  Job:  [id number]

## 2014-11-25 NOTE — Discharge Summary (Signed)
Discharge summary dictated on 11/25/2014 dictation number is [phone]

## 2015-01-25 ENCOUNTER — Encounter (HOSPITAL_COMMUNITY): Payer: Self-pay | Admitting: General Practice

## 2015-01-25 ENCOUNTER — Observation Stay (HOSPITAL_COMMUNITY): Payer: Medicare HMO

## 2015-01-25 ENCOUNTER — Observation Stay (HOSPITAL_COMMUNITY)
Admission: AD | Admit: 2015-01-25 | Discharge: 2015-01-26 | Disposition: A | Discharge status: Home / Self Care | Payer: Medicare HMO | Source: Ambulatory Visit | Attending: Cardiovascular Disease | Admitting: Cardiovascular Disease

## 2015-01-25 DIAGNOSIS — Z87891 Personal history of nicotine dependence: Secondary | ICD-10-CM | POA: Diagnosis not present

## 2015-01-25 DIAGNOSIS — R197 Diarrhea, unspecified: Secondary | ICD-10-CM | POA: Diagnosis not present

## 2015-01-25 DIAGNOSIS — M5090 Cervical disc disorder, unspecified, unspecified cervical region: Secondary | ICD-10-CM | POA: Insufficient documentation

## 2015-01-25 DIAGNOSIS — I10 Essential (primary) hypertension: Secondary | ICD-10-CM | POA: Diagnosis not present

## 2015-01-25 DIAGNOSIS — M1990 Unspecified osteoarthritis, unspecified site: Secondary | ICD-10-CM | POA: Diagnosis not present

## 2015-01-25 DIAGNOSIS — R531 Weakness: Secondary | ICD-10-CM | POA: Diagnosis not present

## 2015-01-25 DIAGNOSIS — E860 Dehydration: Secondary | ICD-10-CM | POA: Diagnosis present

## 2015-01-25 DIAGNOSIS — J45909 Unspecified asthma, uncomplicated: Secondary | ICD-10-CM | POA: Insufficient documentation

## 2015-01-25 DIAGNOSIS — Z79899 Other long term (current) drug therapy: Secondary | ICD-10-CM | POA: Insufficient documentation

## 2015-01-25 DIAGNOSIS — R51 Headache: Secondary | ICD-10-CM | POA: Insufficient documentation

## 2015-01-25 DIAGNOSIS — K219 Gastro-esophageal reflux disease without esophagitis: Secondary | ICD-10-CM | POA: Diagnosis not present

## 2015-01-25 DIAGNOSIS — R42 Dizziness and giddiness: Secondary | ICD-10-CM | POA: Diagnosis not present

## 2015-01-25 DIAGNOSIS — E119 Type 2 diabetes mellitus without complications: Principal | ICD-10-CM

## 2015-01-25 DIAGNOSIS — F419 Anxiety disorder, unspecified: Secondary | ICD-10-CM | POA: Insufficient documentation

## 2015-01-25 LAB — COMPREHENSIVE METABOLIC PANEL
ALBUMIN: 3.5 g/dL (ref 3.5–5.2)
ALT: 10 U/L (ref 0–35)
AST: 20 U/L (ref 0–37)
Alkaline Phosphatase: 104 U/L (ref 39–117)
Anion gap: 7 (ref 5–15)
BUN: 9 mg/dL (ref 6–23)
CALCIUM: 9.1 mg/dL (ref 8.4–10.5)
CHLORIDE: 106 mmol/L (ref 96–112)
CO2: 27 mmol/L (ref 19–32)
CREATININE: 1.09 mg/dL (ref 0.50–1.10)
GFR calc Af Amer: 59 mL/min — ABNORMAL LOW (ref >90)
GFR calc non Af Amer: 51 mL/min — ABNORMAL LOW (ref >90)
Glucose, Bld: 98 mg/dL (ref 70–99)
Potassium: 3.3 mmol/L — ABNORMAL LOW (ref 3.5–5.1)
Sodium: 140 mmol/L (ref 135–145)
Total Bilirubin: 0.3 mg/dL (ref 0.3–1.2)
Total Protein: 6.4 g/dL (ref 6.0–8.3)

## 2015-01-25 LAB — CBC WITH DIFFERENTIAL/PLATELET
Basophils Absolute: 0 10*3/uL (ref 0.0–0.1)
Basophils Relative: 1 % (ref 0–1)
Eosinophils Absolute: 0.3 10*3/uL (ref 0.0–0.7)
Eosinophils Relative: 4 % (ref 0–5)
HCT: 37.2 % (ref 36.0–46.0)
Hemoglobin: 12.4 g/dL (ref 12.0–15.0)
LYMPHS ABS: 3.4 10*3/uL (ref 0.7–4.0)
Lymphocytes Relative: 43 % (ref 12–46)
MCH: 29.5 pg (ref 26.0–34.0)
MCHC: 33.3 g/dL (ref 30.0–36.0)
MCV: 88.4 fL (ref 78.0–100.0)
MONOS PCT: 6 % (ref 3–12)
Monocytes Absolute: 0.5 10*3/uL (ref 0.1–1.0)
NEUTROS PCT: 46 % (ref 43–77)
Neutro Abs: 3.7 10*3/uL (ref 1.7–7.7)
Platelets: 349 10*3/uL (ref 150–400)
RBC: 4.21 MIL/uL (ref 3.87–5.11)
RDW: 13.1 % (ref 11.5–15.5)
WBC: 8 10*3/uL (ref 4.0–10.5)

## 2015-01-25 LAB — URINALYSIS, ROUTINE W REFLEX MICROSCOPIC
Bilirubin Urine: NEGATIVE
Glucose, UA: NEGATIVE mg/dL
KETONES UR: NEGATIVE mg/dL
NITRITE: NEGATIVE
Protein, ur: NEGATIVE mg/dL
Urobilinogen, UA: 0.2 mg/dL (ref 0.0–1.0)
pH: 6 (ref 5.0–8.0)

## 2015-01-25 LAB — URINE MICROSCOPIC-ADD ON

## 2015-01-25 LAB — GLUCOSE, CAPILLARY: Glucose-Capillary: 93 mg/dL (ref 70–99)

## 2015-01-25 MED ORDER — HEPARIN SODIUM (PORCINE) 5000 UNIT/ML IJ SOLN
5000.0000 [IU] | Freq: Three times a day (TID) | INTRAMUSCULAR | Status: DC
  Administered 2015-01-25 – 2015-01-26 (×2): 5000 [IU] via SUBCUTANEOUS
  Filled 2015-01-25 (×4): qty 1

## 2015-01-25 MED ORDER — BUDESONIDE-FORMOTEROL FUMARATE 160-4.5 MCG/ACT IN AERO
2.0000 | INHALATION_SPRAY | Freq: Two times a day (BID) | RESPIRATORY_TRACT | Status: DC
  Administered 2015-01-25 – 2015-01-26 (×2): 2 via RESPIRATORY_TRACT
  Filled 2015-01-25 (×2): qty 6

## 2015-01-25 MED ORDER — ONDANSETRON HCL 4 MG/2ML IJ SOLN: 4.0000 mg | Freq: Four times a day (QID) | INTRAMUSCULAR | Status: DC | PRN

## 2015-01-25 MED ORDER — LOSARTAN POTASSIUM 50 MG PO TABS: 100.0000 mg | ORAL_TABLET | Freq: Every day | ORAL | Status: DC

## 2015-01-25 MED ORDER — ONDANSETRON HCL 4 MG PO TABS: 4.0000 mg | ORAL_TABLET | Freq: Four times a day (QID) | ORAL | Status: DC | PRN

## 2015-01-25 MED ORDER — INSULIN ASPART 100 UNIT/ML ~~LOC~~ SOLN
0.0000 [IU] | Freq: Three times a day (TID) | SUBCUTANEOUS | Status: DC
  Administered 2015-01-26: 3 [IU] via SUBCUTANEOUS

## 2015-01-25 MED ORDER — SODIUM CHLORIDE 0.9 % IJ SOLN
3.0000 mL | Freq: Two times a day (BID) | INTRAMUSCULAR | Status: DC
  Administered 2015-01-26: 3 mL via INTRAVENOUS

## 2015-01-25 MED ORDER — ALUM & MAG HYDROXIDE-SIMETH 200-200-20 MG/5ML PO SUSP: 30.0000 mL | Freq: Four times a day (QID) | ORAL | Status: DC | PRN

## 2015-01-25 MED ORDER — PANTOPRAZOLE SODIUM 40 MG PO TBEC
40.0000 mg | DELAYED_RELEASE_TABLET | Freq: Every day | ORAL | Status: DC
  Administered 2015-01-26: 40 mg via ORAL
  Filled 2015-01-25: qty 1

## 2015-01-25 MED ORDER — ALPRAZOLAM 0.25 MG PO TABS: 0.2500 mg | ORAL_TABLET | Freq: Two times a day (BID) | ORAL | Status: DC | PRN

## 2015-01-25 MED ORDER — DOCUSATE SODIUM 100 MG PO CAPS
100.0000 mg | ORAL_CAPSULE | Freq: Two times a day (BID) | ORAL | Status: DC
  Administered 2015-01-26: 100 mg via ORAL
  Filled 2015-01-25 (×3): qty 1

## 2015-01-25 MED ORDER — AMLODIPINE BESYLATE 5 MG PO TABS
5.0000 mg | ORAL_TABLET | Freq: Every day | ORAL | Status: DC
  Administered 2015-01-25 – 2015-01-26 (×2): 5 mg via ORAL
  Filled 2015-01-25 (×2): qty 1

## 2015-01-25 MED ORDER — METOPROLOL TARTRATE 50 MG PO TABS
50.0000 mg | ORAL_TABLET | Freq: Two times a day (BID) | ORAL | Status: DC
  Administered 2015-01-25 – 2015-01-26 (×2): 50 mg via ORAL
  Filled 2015-01-25 (×3): qty 1

## 2015-01-25 MED ORDER — ADULT MULTIVITAMIN W/MINERALS CH
1.0000 | ORAL_TABLET | Freq: Every day | ORAL | Status: DC
  Administered 2015-01-25 – 2015-01-26 (×2): 1 via ORAL
  Filled 2015-01-25 (×2): qty 1

## 2015-01-25 MED ORDER — BENZONATATE 100 MG PO CAPS
100.0000 mg | ORAL_CAPSULE | Freq: Three times a day (TID) | ORAL | Status: DC | PRN
  Filled 2015-01-25: qty 1

## 2015-01-25 MED ORDER — ROSUVASTATIN CALCIUM 10 MG PO TABS
10.0000 mg | ORAL_TABLET | Freq: Every day | ORAL | Status: DC
  Administered 2015-01-25 – 2015-01-26 (×2): 10 mg via ORAL
  Filled 2015-01-25 (×2): qty 1

## 2015-01-25 MED ORDER — LOSARTAN POTASSIUM 50 MG PO TABS
100.0000 mg | ORAL_TABLET | Freq: Every day | ORAL | Status: DC
  Administered 2015-01-26: 100 mg via ORAL
  Filled 2015-01-25: qty 2

## 2015-01-25 MED ORDER — CYCLOBENZAPRINE HCL 10 MG PO TABS: 10.0000 mg | ORAL_TABLET | Freq: Two times a day (BID) | ORAL | Status: DC | PRN

## 2015-01-25 MED ORDER — LINAGLIPTIN 5 MG PO TABS
5.0000 mg | ORAL_TABLET | Freq: Every day | ORAL | Status: DC
  Administered 2015-01-26: 5 mg via ORAL
  Filled 2015-01-25 (×2): qty 1

## 2015-01-25 MED ORDER — SODIUM CHLORIDE 0.9 % IV SOLN
INTRAVENOUS | Status: DC
  Administered 2015-01-25 – 2015-01-26 (×2): via INTRAVENOUS

## 2015-01-25 MED ORDER — METFORMIN HCL 500 MG PO TABS
500.0000 mg | ORAL_TABLET | Freq: Two times a day (BID) | ORAL | Status: DC
  Administered 2015-01-26: 500 mg via ORAL
  Filled 2015-01-25 (×3): qty 1

## 2015-01-25 NOTE — H&P (Signed)
Referring Physician:  Aqsa F Oplinger is an 69 y.o. female.                       Chief Complaint: Weakness and dizziness  HPI: 69 year old female with 4 day history of diarrhea and uncontrolled type II diabetes has weakness and dizziness. Also has chronic headache and hypertension. PMH of DM, II, Hypertension, Chronic headaches, Neck and Low back pain, Obesity and GERD.  Past Medical History  Diagnosis Date  . Diabetes mellitus   . Hypertension   . GERD (gastroesophageal reflux disease)   . Herniated disc   . Arthritis   . Shortness of breath on exertion 11/10/11    "sometimes"  . H/O hiatal hernia   . Headache(784.0)   . Hemorrhoids   . Chronic back pain greater than 3 months duration   . Vertigo   . Chronic neck pain   . Hx of echocardiogram 2010    normal EF      Past Surgical History  Procedure Laterality Date  . Knee surgery      right  . Shoulder open rotator cuff repair  07/20/2011    left  . Fracture surgery  2004?    right shoulder  . Knee arthroscopy  2004    right  . Tonsillectomy    . Cholecystectomy  ~ 2008  . Abdominal hysterectomy  1997?    "partial"  . Neck fusion      No family history on file. Social History:  reports that she quit smoking about 2 months ago. Her smoking use included Cigarettes. She has a 8 pack-year smoking history. She has never used smokeless tobacco. She reports that she does not drink alcohol or use illicit drugs.  Allergies:  Allergies  Allergen Reactions  . Percocet [Oxycodone-Acetaminophen] Itching    Medications Prior to Admission  Medication Sig Dispense Refill  . ALPRAZolam (XANAX) 0.25 MG tablet Take 0.25 mg by mouth 2 (two) times daily as needed for anxiety or sleep.   2  . amLODipine (NORVASC) 5 MG tablet Take 5 mg by mouth daily.    . benzonatate (TESSALON) 100 MG capsule Take 1 capsule (100 mg total) by mouth 3 (three) times daily as needed for cough. 15 capsule 0  . budesonide-formoterol (SYMBICORT) 160-4.5  MCG/ACT inhaler Inhale 2 puffs into the lungs 2 (two) times daily. 1 Inhaler 12  . cholecalciferol (VITAMIN D) 1000 UNITS tablet Take 1,000 Units by mouth daily.    . cyclobenzaprine (FLEXERIL) 10 MG tablet Take 10 mg by mouth 2 (two) times daily as needed for muscle spasms.   3  . esomeprazole (NEXIUM) 40 MG capsule Take 40 mg by mouth daily as needed (for heartburn).     . losartan (COZAAR) 100 MG tablet Take 100 mg by mouth daily.  3  . metFORMIN (GLUCOPHAGE) 500 MG tablet Take 500 mg by mouth 2 (two) times daily.    . metoprolol (LOPRESSOR) 50 MG tablet Take 50 mg by mouth 2 (two) times daily.  3  . predniSONE (DELTASONE) 20 MG tablet Take 1 tablet (20 mg total) by mouth daily. Start 11/18/2014 10 tablet 0  . rosuvastatin (CRESTOR) 10 MG tablet Take 10 mg by mouth daily.      No results found for this or any previous visit (from the past 48 hour(s)). No results found.  Review Of Systems Constitutional: Negative for fever and chills.  Pulmonary: Positive for Asthma, COPD, Cough. Gastrointestinal: Negative  for nausea, vomiting, abdominal pain and diarrhea.  Genitourinary: Positive for frequency. Negative for dysuria, hematuria and decreased urine volume.  Skin: Negative for rash and wound.  Neurological: Negative for weakness and numbness.   Blood pressure 155/62, pulse 68, temperature 98.3 F (36.8 C), temperature source Oral, resp. rate 20, SpO2 100 %. Physical Exam: General appearance: alert, cooperative and appears stated age Head: Normocephalic, without obvious abnormality, atraumatic Eyes: Brown eyes, conjunctivae/corneas clear. PERRL, EOM's intact. Sclera-white Neck: No adenopathy, no carotid bruit, no JVD, supple, symmetrical, trachea midline and thyroid not enlarged, scar of surgery. Resp: Clear on auscultation bilaterally Cardio: regular rate and rhythm, S1, S2 normal, II / VI systolic murmur,  GI: soft, non-tender; bowel sounds normal; no masses, no organomegaly.  Scar of GB surgery. Extremities: extremities normal, atraumatic, no cyanosis or edema. + clubbing. Skin: Skin color, texture, turgor normal. No rashes or lesions Neurologic: Alert and oriented X 3, normal strength and tone. Normal coordination and gait. Psychiatry: Appears anxious.  Assessment/Plan Dehydration Dizziness Cervical disc disease DM, II Hypertension Obesity Headache  IV fluids.  Control sugar levels Blood work and home medications  Birdie Riddle, MD  01/25/2015, 4:20 PM

## 2015-01-26 DIAGNOSIS — E119 Type 2 diabetes mellitus without complications: Secondary | ICD-10-CM | POA: Diagnosis not present

## 2015-01-26 LAB — CBC
HEMATOCRIT: 36.1 % (ref 36.0–46.0)
HEMOGLOBIN: 12.1 g/dL (ref 12.0–15.0)
MCH: 30 pg (ref 26.0–34.0)
MCHC: 33.5 g/dL (ref 30.0–36.0)
MCV: 89.4 fL (ref 78.0–100.0)
PLATELETS: 323 10*3/uL (ref 150–400)
RBC: 4.04 MIL/uL (ref 3.87–5.11)
RDW: 13.2 % (ref 11.5–15.5)
WBC: 6.5 10*3/uL (ref 4.0–10.5)

## 2015-01-26 LAB — BASIC METABOLIC PANEL
ANION GAP: 7 (ref 5–15)
BUN: 11 mg/dL (ref 6–23)
CALCIUM: 8.8 mg/dL (ref 8.4–10.5)
CHLORIDE: 105 mmol/L (ref 96–112)
CO2: 26 mmol/L (ref 19–32)
Creatinine, Ser: 0.99 mg/dL (ref 0.50–1.10)
GFR calc non Af Amer: 58 mL/min — ABNORMAL LOW (ref >90)
GFR, EST AFRICAN AMERICAN: 67 mL/min — AB (ref >90)
Glucose, Bld: 129 mg/dL — ABNORMAL HIGH (ref 70–99)
Potassium: 3.4 mmol/L — ABNORMAL LOW (ref 3.5–5.1)
SODIUM: 138 mmol/L (ref 135–145)

## 2015-01-26 LAB — HEMOGLOBIN A1C
Hgb A1c MFr Bld: 6.7 % — ABNORMAL HIGH (ref 4.8–5.6)
Mean Plasma Glucose: 146 mg/dL

## 2015-01-26 LAB — GLUCOSE, CAPILLARY
GLUCOSE-CAPILLARY: 145 mg/dL — AB (ref 70–99)
Glucose-Capillary: 124 mg/dL — ABNORMAL HIGH (ref 70–99)
Glucose-Capillary: 117 mg/dL — ABNORMAL HIGH (ref 70–99)

## 2015-01-26 LAB — PROTIME-INR
INR: 1.01 (ref 0.00–1.49)
Prothrombin Time: 13.4 seconds (ref 11.6–15.2)

## 2015-01-26 MED ORDER — CIPROFLOXACIN HCL 250 MG PO TABS
250.0000 mg | ORAL_TABLET | Freq: Two times a day (BID) | ORAL | Status: DC
  Administered 2015-01-26: 250 mg via ORAL
  Filled 2015-01-26 (×3): qty 1

## 2015-01-26 MED ORDER — POTASSIUM CHLORIDE ER 10 MEQ PO TBCR
10.0000 meq | EXTENDED_RELEASE_TABLET | Freq: Two times a day (BID) | ORAL | Status: DC
  Administered 2015-01-26: 10 meq via ORAL
  Filled 2015-01-26 (×2): qty 1

## 2015-01-26 NOTE — Progress Notes (Signed)
01/26/2015 12:25 PM Discharge AVS meds taken today and those due this evening reviewed.  Follow-up appointments and when to call md reviewed.  D/C IV and TELE.  Questions and concerns addressed.   D/C home per orders. [place]ney [place]

## 2015-01-26 NOTE — Discharge Instructions (Signed)
Dizziness Dizziness is a common problem. It is a feeling of unsteadiness or light-headedness. You Meygan feel like you are about to faint. Dizziness can lead to injury if you stumble or fall. A person of any age group can suffer from dizziness, but dizziness is more common in older adults. CAUSES  Dizziness can be caused by many different things, including:  Middle ear problems.  Standing for too long.  Infections.  An allergic reaction.  Aging.  An emotional response to something, such as the sight of blood.  Side effects of medicines.  Tiredness.  Problems with circulation or blood pressure.  Excessive use of alcohol or medicines, or illegal drug use.  Breathing too fast (hyperventilation).  An irregular heart rhythm (arrhythmia).  A low red blood cell count (anemia).  Pregnancy.  Vomiting, diarrhea, fever, or other illnesses that cause body fluid loss (dehydration).  Diseases or conditions such as Parkinson's disease, high blood pressure (hypertension), diabetes, and thyroid problems.  Exposure to extreme heat. DIAGNOSIS  Your health care provider will ask about your symptoms, perform a physical exam, and perform an electrocardiogram (ECG) to record the electrical activity of your heart. Your health care provider Brecklynn also perform other heart or blood tests to determine the cause of your dizziness. These Patrice include:  Transthoracic echocardiogram (TTE). During echocardiography, sound waves are used to evaluate how blood flows through your heart.  Transesophageal echocardiogram (TEE).  Cardiac monitoring. This allows your health care provider to monitor your heart rate and rhythm in real time.  Holter monitor. This is a portable device that records your heartbeat and can help diagnose heart arrhythmias. It allows your health care provider to track your heart activity for several days if needed.  Stress tests by exercise or by giving medicine that makes the heart beat  faster. TREATMENT  Treatment of dizziness depends on the cause of your symptoms and can vary greatly. HOME CARE INSTRUCTIONS   Drink enough fluids to keep your urine clear or pale yellow. This is especially important in very hot weather. In older adults, it is also important in cold weather.  Take your medicine exactly as directed if your dizziness is caused by medicines. When taking blood pressure medicines, it is especially important to get up slowly.  Rise slowly from chairs and steady yourself until you feel okay.  In the morning, first sit up on the side of the bed. When you feel okay, stand slowly while holding onto something until you know your balance is fine.  Move your legs often if you need to stand in one place for a long time. Tighten and relax your muscles in your legs while standing.  Have someone stay with you for 1-2 days if dizziness continues to be a problem. Do this until you feel you are well enough to stay alone. Have the person call your health care provider if he or she notices changes in you that are concerning.  Do not drive or use heavy machinery if you feel dizzy.  Do not drink alcohol. SEEK IMMEDIATE MEDICAL CARE IF:   Your dizziness or light-headedness gets worse.  You feel nauseous or vomit.  You have problems talking, walking, or using your arms, hands, or legs.  You feel weak.  You are not thinking clearly or you have trouble forming sentences. It Vernecia take a friend or family member to notice this.  You have chest pain, abdominal pain, shortness of breath, or sweating.  Your vision changes.  You notice   any bleeding.  You have side effects from medicine that seems to be getting worse rather than better. MAKE SURE YOU:   Understand these instructions.  Will watch your condition.  Will get help right away if you are not doing well or get worse. Document Released: 05/05/2001 Document Revised: 11/14/2013 Document Reviewed: 05/29/2011 ExitCare  Patient Information 2015 [employer]. This information is not intended to replace advice given to you by your health care provider. Make sure you discuss any questions you have with your health care provider.  

## 2015-01-26 NOTE — Progress Notes (Signed)
01/26/2015 10:12 AM Beaver, Lannie Fields, RN,BSN Utilization review completed.

## 2015-01-26 NOTE — Discharge Summary (Signed)
Discharge summary dictated on 01/26/2015 dictation number is [phone]

## 2015-01-26 NOTE — Discharge Summary (Signed)
NAMEORPHA, DAIN                   ACCOUNT NO.:  [id number]  MEDICAL RECORD NO.:  [id number]  LOCATION:  2W11C                        FACILITY:  [facility]  PHYSICIAN:  Allegra Lai. Terrence Dupont, M.D. DATE OF BIRTH:  February 27, 1946  DATE OF ADMISSION:  01/25/2015 DATE OF DISCHARGE:  01/26/2015                              DISCHARGE SUMMARY   ADMITTING DIAGNOSES: 1. Dehydration, dizziness. 2. Cervical disk disease. 3. Diabetes mellitus. 4. Hypertension. 5. Obesity. 6. Headache.  DISCHARGE DIAGNOSES: 1. Status post uncontrolled hypertension. 2. Status post dizziness/dehydration. 3. Diabetes mellitus. 4. Morbid obesity. 5. History of bronchial asthma. 6. Status post headaches. 7. Cervical disk disease. 8. Gastroesophageal reflux disease.  DISCHARGE HOME MEDICATIONS: 1. Alprazolam 0.25 mg 1 tablet twice daily as needed. 2. Amlodipine 5 mg 1 tablet daily. 3. Tessalon capsule one 3 times daily as needed for cough. 4. Symbicort 2 puffs twice daily as before. 5. Vitamin D 1000 units daily as before. 6. Flexeril 10 mg twice daily as needed. 7. Nexium 40 mg 1 capsule daily. 8. Imodium 4 mg as needed for diarrhea. 9. Losartan 100 mg daily. 10.Metformin 500 mg twice daily. 11.Metoprolol tartrate 50 mg twice daily. 12.Crestor 10 mg daily.  DIET:  Low salt, low cholesterol, 1800 calories ADA diet.  The patient has been advised to monitor blood pressure and blood sugar daily.  CONDITION AT DISCHARGE:  Stable.  FOLLOWUP:  With Dr. Doylene Canard in 1 week.  BRIEF HISTORY AND HOSPITAL COURSE:  Ms. Hou is a 69 year old female with past medical history significant for multiple medical problems, i.e., hypertension, diabetes mellitus, morbid obesity, degenerative joint disease, anxiety disorder, history of bronchial asthma and GERD was admitted by Dr. Doylene Canard because of 4-day history of diarrhea and uncontrolled diabetes mellitus.  Had weakness and dizziness, chronic headaches and controlled blood  pressure.  The patient denies any chest pain, nausea, vomiting, diaphoresis.  Denies palpitation, lightheadedness, or syncope.  PAST MEDICAL HISTORY:  As above.  PAST SURGICAL HISTORY:  She had right knee surgery in the past, had left rotator cuff repair in the past, had right shoulder surgery in the past, right arthroscopic knee surgery in the past, had cholecystectomy and partial abdominal hysterectomy in the past, had neck fusion in the past.  PHYSICAL EXAMINATION:  GENERAL:  She was alert, awake, oriented x3. VITAL SIGNS:  Blood pressure was 155/62, pulse was 68, she was afebrile. HEENT:  Conjunctivae were pink. NECK:  Supple.  No JVD.  No bruit. LUNGS:  Clear to auscultation without rhonchi, rales. CARDIOVASCULAR:  S1, S2 was normal.  There was 2/6 systolic murmur. ABDOMEN:  Soft.  Bowel sounds were present.  Nontender. EXTREMITIES:  No clubbing, cyanosis, or edema. NEUROLOGIC:  Grossly intact.  LABORATORY DATA:  Essentially normal except for potassium being low, sodium was 140, potassium 3.3 which was replaced.  BUN was 9, creatinine 1.09.  Hemoglobin was 12.4, hematocrit 37.2, white count of 8.0 with no shift to the left.  Hemoglobin A1c was 6.7.  Urinalysis was essentially negative.  Blood sugar has remained between 93-145.  BRIEF HOSPITAL COURSE:  The patient was admitted to telemetry unit.  The patient did not have any further episodes  of dizziness or further diarrhea.  Denies any urinary complaints.  She states she is feeling much better after fluid hydration.  The patient is ambulating in hallway and room without any problems.  The patient's blood sugar and blood pressure have been well controlled.  The patient will be discharged home on above medications and will be followed up by Dr. Doylene Canard in 1 week.     Allegra Lai. Terrence Dupont, M.D.     MNH/MEDQ  D:  01/26/2015  T:  01/26/2015  Job:  [id number]

## 2015-03-09 ENCOUNTER — Encounter (HOSPITAL_COMMUNITY): Payer: Self-pay | Admitting: Emergency Medicine

## 2015-03-09 ENCOUNTER — Emergency Department (HOSPITAL_COMMUNITY)
Admission: EM | Admit: 2015-03-09 | Discharge: 2015-03-09 | Disposition: A | Discharge status: Home / Self Care | Payer: Medicare HMO | Attending: Emergency Medicine | Admitting: Emergency Medicine

## 2015-03-09 DIAGNOSIS — Z720 Tobacco use: Secondary | ICD-10-CM | POA: Diagnosis not present

## 2015-03-09 DIAGNOSIS — I10 Essential (primary) hypertension: Secondary | ICD-10-CM | POA: Diagnosis not present

## 2015-03-09 DIAGNOSIS — Z7951 Long term (current) use of inhaled steroids: Secondary | ICD-10-CM | POA: Diagnosis not present

## 2015-03-09 DIAGNOSIS — E119 Type 2 diabetes mellitus without complications: Secondary | ICD-10-CM | POA: Diagnosis not present

## 2015-03-09 DIAGNOSIS — Z79899 Other long term (current) drug therapy: Secondary | ICD-10-CM | POA: Diagnosis not present

## 2015-03-09 DIAGNOSIS — Z8739 Personal history of other diseases of the musculoskeletal system and connective tissue: Secondary | ICD-10-CM | POA: Insufficient documentation

## 2015-03-09 DIAGNOSIS — R197 Diarrhea, unspecified: Secondary | ICD-10-CM | POA: Diagnosis not present

## 2015-03-09 DIAGNOSIS — K219 Gastro-esophageal reflux disease without esophagitis: Secondary | ICD-10-CM | POA: Diagnosis not present

## 2015-03-09 DIAGNOSIS — R42 Dizziness and giddiness: Secondary | ICD-10-CM

## 2015-03-09 DIAGNOSIS — G8929 Other chronic pain: Secondary | ICD-10-CM | POA: Insufficient documentation

## 2015-03-09 DIAGNOSIS — R110 Nausea: Secondary | ICD-10-CM

## 2015-03-09 LAB — BASIC METABOLIC PANEL
Anion gap: 11 (ref 5–15)
BUN: 12 mg/dL (ref 6–23)
CO2: 23 mmol/L (ref 19–32)
CREATININE: 0.99 mg/dL (ref 0.50–1.10)
Calcium: 9.6 mg/dL (ref 8.4–10.5)
Chloride: 103 mmol/L (ref 96–112)
GFR calc non Af Amer: 58 mL/min — ABNORMAL LOW (ref >90)
GFR, EST AFRICAN AMERICAN: 67 mL/min — AB (ref >90)
Glucose, Bld: 136 mg/dL — ABNORMAL HIGH (ref 70–99)
Potassium: 3.6 mmol/L (ref 3.5–5.1)
Sodium: 137 mmol/L (ref 135–145)

## 2015-03-09 LAB — CBC
HEMATOCRIT: 41.3 % (ref 36.0–46.0)
HEMOGLOBIN: 13.9 g/dL (ref 12.0–15.0)
MCH: 30 pg (ref 26.0–34.0)
MCHC: 33.7 g/dL (ref 30.0–36.0)
MCV: 89.2 fL (ref 78.0–100.0)
Platelets: 376 10*3/uL (ref 150–400)
RBC: 4.63 MIL/uL (ref 3.87–5.11)
RDW: 12.8 % (ref 11.5–15.5)
WBC: 9.1 10*3/uL (ref 4.0–10.5)

## 2015-03-09 NOTE — ED Provider Notes (Signed)
CSN: [id number]     Arrival date & time 03/09/15  1816 History   None    Chief Complaint  Patient presents with  . Hypertension  . Dizziness  . Blurred Vision     (Consider location/radiation/quality/duration/timing/severity/associated sxs/prior Treatment) Patient is a 69 y.o. female presenting with dizziness. The history is provided by the patient. No language interpreter was used.  Dizziness Quality:  Lightheadedness Severity:  Mild Onset quality:  Gradual Timing:  Intermittent Progression:  Waxing and waning Chronicity:  Recurrent Context: medication   Context: not with bowel movement, not with head movement and not with loss of consciousness   Relieved by:  Nothing Worsened by:  Nothing Ineffective treatments:  None tried Associated symptoms: diarrhea and nausea   Associated symptoms: no blood in stool, no chest pain, no headaches, no palpitations, no shortness of breath, no syncope, no tinnitus, no vision changes, no vomiting and no weakness   Risk factors: hx of vertigo, multiple medications and new medications   Risk factors: no hx of stroke     Past Medical History  Diagnosis Date  . Diabetes mellitus   . Hypertension   . GERD (gastroesophageal reflux disease)   . Herniated disc   . Arthritis   . Shortness of breath on exertion 11/10/11    "sometimes"  . H/O hiatal hernia   . Headache(784.0)   . Hemorrhoids   . Chronic back pain greater than 3 months duration   . Vertigo   . Chronic neck pain   . Hx of echocardiogram 2010    normal EF   Past Surgical History  Procedure Laterality Date  . Knee surgery      right  . Shoulder open rotator cuff repair  07/20/2011    left  . Fracture surgery  2004?    right shoulder  . Knee arthroscopy  2004    right  . Tonsillectomy    . Cholecystectomy  ~ 2008  . Abdominal hysterectomy  1997?    "partial"  . Neck fusion     No family history on file. History  Substance Use Topics  . Smoking status: Current Some  Day Smoker -- 0.20 packs/day for 40 years    Types: Cigarettes  . Smokeless tobacco: Never Used     Comment: smoking cessation consult entered  . Alcohol Use: No   OB History    No data available     Review of Systems  Constitutional: Negative for fever, diaphoresis and fatigue.  HENT: Negative for tinnitus.   Respiratory: Negative for cough, chest tightness and shortness of breath.   Cardiovascular: Negative for chest pain, palpitations and syncope.  Gastrointestinal: Positive for nausea and diarrhea. Negative for vomiting, abdominal pain and blood in stool.  Musculoskeletal: Negative for gait problem.  Neurological: Positive for light-headedness. Negative for dizziness, facial asymmetry, speech difficulty, weakness and headaches.  Psychiatric/Behavioral: Negative for confusion.  All other systems reviewed and are negative.     Allergies  Oxycodone  Home Medications   Prior to Admission medications   Medication Sig Start Date End Date Taking? Authorizing Provider  ALPRAZolam (XANAX) 0.25 MG tablet Take 0.25 mg by mouth 2 (two) times daily as needed for anxiety or sleep.  10/16/14   Historical Provider, MD  amLODipine (NORVASC) 5 MG tablet Take 5 mg by mouth daily.    Historical Provider, MD  benzonatate (TESSALON) 100 MG capsule Take 1 capsule (100 mg total) by mouth 3 (three) times daily as needed for cough.  11/17/14   Francine Graven, DO  budesonide-formoterol (SYMBICORT) 160-4.5 MCG/ACT inhaler Inhale 2 puffs into the lungs 2 (two) times daily. 11/25/14   Charolette Forward, MD  cholecalciferol (VITAMIN D) 1000 UNITS tablet Take 1,000 Units by mouth daily.    Historical Provider, MD  cyclobenzaprine (FLEXERIL) 10 MG tablet Take 10 mg by mouth 2 (two) times daily as needed for muscle spasms.  09/23/14   Historical Provider, MD  esomeprazole (NEXIUM) 40 MG capsule Take 40 mg by mouth daily as needed (for heartburn).     Historical Provider, MD  loperamide (IMODIUM) 2 MG capsule Take  4 mg by mouth as needed for diarrhea or loose stools.    Historical Provider, MD  losartan (COZAAR) 100 MG tablet Take 100 mg by mouth daily. 09/20/14   Historical Provider, MD  metFORMIN (GLUCOPHAGE) 500 MG tablet Take 500 mg by mouth 2 (two) times daily. 01/06/14   Historical Provider, MD  metoprolol (LOPRESSOR) 50 MG tablet Take 50 mg by mouth 2 (two) times daily. 09/03/14   Historical Provider, MD  rosuvastatin (CRESTOR) 10 MG tablet Take 10 mg by mouth daily.    Historical Provider, MD   BP 157/72 mmHg  Pulse 63  Temp(Src) 98.1 F (36.7 C) (Oral)  Resp 18  Ht 5\' 7"  (1.702 m)  Wt 221 lb (100.245 kg)  BMI 34.61 kg/m2  SpO2 99%  Filed Vitals:   03/09/15 1840 03/09/15 2122 03/09/15 2230 03/09/15 2233  BP:  125/70 121/63   Pulse:  54 56   Temp:    98.2 F (36.8 C)  TempSrc:    Oral  Resp:  16 15   Height:      Weight: 221 lb (100.245 kg)     SpO2:  100% 100%      Physical Exam  Constitutional: She is oriented to person, place, and time. Vital signs are normal. She appears well-developed and well-nourished. She does not appear ill. No distress.  HENT:  Head: Normocephalic and atraumatic.  Nose: Nose normal.  Mouth/Throat: Oropharynx is clear and moist. No oropharyngeal exudate.  Hearing intact bilaterally to finger rub  Eyes: EOM are normal. Pupils are equal, round, and reactive to light.  No nystagmus  Neck: Normal range of motion. Neck supple.  Cardiovascular: Normal rate, regular rhythm, normal heart sounds and intact distal pulses.   No murmur heard. Pulmonary/Chest: Effort normal and breath sounds normal. No respiratory distress. She has no wheezes. She exhibits no tenderness.  Abdominal: Soft. There is no tenderness. There is no rebound and no guarding.  Musculoskeletal: Normal range of motion. She exhibits no tenderness.  Lymphadenopathy:    She has no cervical adenopathy.  Neurological: She is alert and oriented to person, place, and time. No cranial nerve deficit.  Coordination normal.  Full strength and sensation of bilateral upper and lower extremities. Normal coordination  Skin: Skin is warm and dry. She is not diaphoretic.  Psychiatric: She has a normal mood and affect. Her behavior is normal. Judgment and thought content normal.  Nursing note and vitals reviewed.   ED Course  Procedures (including critical care time) Labs Review Labs Reviewed  BASIC METABOLIC PANEL - Abnormal; Notable for the following:    Glucose, Bld 136 (*)    GFR calc non Af Amer 58 (*)    GFR calc Af Amer 67 (*)    All other components within normal limits  CBC   Imaging Review No results found.   EKG Interpretation   Date/Time:  Saturday March 09 2015 21:20:20 EDT Ventricular Rate:  58 PR Interval:  182 QRS Duration: 90 QT Interval:  470 QTC Calculation: 462 R Axis:   3 Text Interpretation:  Sinus rhythm No significant change since last  tracing Confirmed by KOHUT  MD, STEPHEN ([id number]) on 03/09/2015 10:16:50 PM      MDM   Final diagnoses:  Lightheadedness  Diarrhea  Nausea   Pt is a 69 yo M with a hx of DM, HTN, vertigo, and back pain who presents with lightheadedness and diarrhea.  Has hx of room spinning vertigo, but this is different.  Lightheaded sensation and fatigue.  Had similar sx 2 weeks ago so presented to the ED and was found to be hypertensive.  Reportedly admitted under obs and had an increase in her BP meds, then discharged.  Asymptomatic for a few days, then the sensation started again.  Was seen by her PCP several days ago and again had an increase in BP meds, with her norvasc changed from qday to BID.  Patient also complains of 3 days of loose stools with 3-4 episodes daily.   Associated nausea but no vomiting. Denies chest pain, palpitations, or SOB.  No recent travel, no recent Abx use  Well appearing.  No nystagmus.  Normal neuro exam.  Vitals stable (BP 130).    Will work up with EKG and labs to ensure that patient is not dehydrated  from the diarrhea, and to look at electrolytes based on increased doses of HTN meds.   Labs returned unremarkable.  EKG NSR, benign. Not dehydrated.  Doubt acute emergent pathology.  Peggy feel lightheaded because she is now normotensive when she was likely chronically hypertensive for a long time.  Doubt intracranial pathology.  Will dc home.  Advised close follow up with PCP.   Patient was seen with ED Attending, Dr. Joette Catching, MD   Tori Milks, MD 03/10/15 [id number]  Virgel Manifold, MD 03/12/15 [phone]

## 2015-03-09 NOTE — ED Notes (Signed)
Pt c/o HTN, blurred vision and dizziness x 1 1/2 weeks.  Pt seen by PMD for same.

## 2015-03-09 NOTE — ED Notes (Signed)
Pt c/o left sided headache x 1.5 weeks associated with blurred vision and hypertension. Pt has seen PCP for similar complaints.

## 2015-03-13 ENCOUNTER — Encounter (HOSPITAL_COMMUNITY): Payer: Self-pay

## 2015-03-13 ENCOUNTER — Emergency Department (HOSPITAL_COMMUNITY)
Admission: EM | Admit: 2015-03-13 | Discharge: 2015-03-13 | Disposition: A | Discharge status: Home / Self Care | Payer: Medicare HMO | Attending: Emergency Medicine | Admitting: Emergency Medicine

## 2015-03-13 ENCOUNTER — Emergency Department (HOSPITAL_COMMUNITY): Payer: Medicare HMO

## 2015-03-13 DIAGNOSIS — Z79899 Other long term (current) drug therapy: Secondary | ICD-10-CM | POA: Diagnosis not present

## 2015-03-13 DIAGNOSIS — H538 Other visual disturbances: Secondary | ICD-10-CM | POA: Diagnosis not present

## 2015-03-13 DIAGNOSIS — Z7951 Long term (current) use of inhaled steroids: Secondary | ICD-10-CM | POA: Insufficient documentation

## 2015-03-13 DIAGNOSIS — G8929 Other chronic pain: Secondary | ICD-10-CM | POA: Insufficient documentation

## 2015-03-13 DIAGNOSIS — K219 Gastro-esophageal reflux disease without esophagitis: Secondary | ICD-10-CM | POA: Insufficient documentation

## 2015-03-13 DIAGNOSIS — R202 Paresthesia of skin: Secondary | ICD-10-CM | POA: Insufficient documentation

## 2015-03-13 DIAGNOSIS — Z720 Tobacco use: Secondary | ICD-10-CM | POA: Insufficient documentation

## 2015-03-13 DIAGNOSIS — E119 Type 2 diabetes mellitus without complications: Secondary | ICD-10-CM | POA: Diagnosis not present

## 2015-03-13 DIAGNOSIS — I10 Essential (primary) hypertension: Secondary | ICD-10-CM | POA: Insufficient documentation

## 2015-03-13 DIAGNOSIS — Z8739 Personal history of other diseases of the musculoskeletal system and connective tissue: Secondary | ICD-10-CM | POA: Insufficient documentation

## 2015-03-13 DIAGNOSIS — R200 Anesthesia of skin: Secondary | ICD-10-CM

## 2015-03-13 DIAGNOSIS — R42 Dizziness and giddiness: Secondary | ICD-10-CM | POA: Insufficient documentation

## 2015-03-13 LAB — I-STAT CHEM 8, ED
BUN: 18 mg/dL (ref 6–23)
CALCIUM ION: 1.15 mmol/L (ref 1.13–1.30)
CHLORIDE: 102 mmol/L (ref 96–112)
Creatinine, Ser: 1 mg/dL (ref 0.50–1.10)
GLUCOSE: 176 mg/dL — AB (ref 70–99)
HCT: 47 % — ABNORMAL HIGH (ref 36.0–46.0)
Hemoglobin: 16 g/dL — ABNORMAL HIGH (ref 12.0–15.0)
POTASSIUM: 4.2 mmol/L (ref 3.5–5.1)
SODIUM: 139 mmol/L (ref 135–145)
TCO2: 23 mmol/L (ref 0–100)

## 2015-03-13 LAB — CBG MONITORING, ED: GLUCOSE-CAPILLARY: 163 mg/dL — AB (ref 70–99)

## 2015-03-13 NOTE — Discharge Instructions (Signed)
No evidence of stroke from today's exam and MRI scan. Blood sugar today was mildly elevated at 176. Call Dr.Kadakia tomorrow to schedule an office visit. Return if you feel worse for any reason

## 2015-03-13 NOTE — ED Provider Notes (Cosign Needed)
CSN: [id number]     Arrival date & time 03/13/15  1102 History   First MD Initiated Contact with Patient 03/13/15 1123     Chief Complaint  Patient presents with  . Numbness     (Consider location/radiation/quality/duration/timing/severity/associated sxs/prior Treatment) HPI Patient complains of numbness of her entire face, left side greater than right, bilateral feet and her left arm sudden onset 30 minutes ago symptoms accompanied by blurred vision and lightheadedness. She denies pain anywhere denies shortness of breath nothing makes symptoms better or worse she presented to the emergency department with "dizziness and blurred vision" 03/09/15.Marland Kitchen Symptoms resolved. She also reports that she follow up with her primary care physician Dr.Kadakia 2 days ago who suggested to her that she maintain her current medication regimen. No other associated symptoms. No treatment prior to coming here. Denies vertigo. Denies chest pain. Past Medical History  Diagnosis Date  . Diabetes mellitus   . Hypertension   . GERD (gastroesophageal reflux disease)   . Herniated disc   . Arthritis   . Shortness of breath on exertion 11/10/11    "sometimes"  . H/O hiatal hernia   . Headache(784.0)   . Hemorrhoids   . Chronic back pain greater than 3 months duration   . Vertigo   . Chronic neck pain   . Hx of echocardiogram 2010    normal EF   Past Surgical History  Procedure Laterality Date  . Knee surgery      right  . Shoulder open rotator cuff repair  07/20/2011    left  . Fracture surgery  2004?    right shoulder  . Knee arthroscopy  2004    right  . Tonsillectomy    . Cholecystectomy  ~ 2008  . Abdominal hysterectomy  1997?    "partial"  . Neck fusion     History reviewed. No pertinent family history. History  Substance Use Topics  . Smoking status: Current Some Day Smoker -- 0.20 packs/day for 40 years    Types: Cigarettes  . Smokeless tobacco: Never Used     Comment: smoking cessation  consult entered  . Alcohol Use: No   OB History    No data available     Review of Systems  Eyes: Positive for visual disturbance.  Neurological: Positive for light-headedness and numbness.  All other systems reviewed and are negative.     Allergies  Oxycodone  Home Medications   Prior to Admission medications   Medication Sig Start Date End Date Taking? Authorizing Provider  ALPRAZolam (XANAX) 0.25 MG tablet Take 0.25 mg by mouth 2 (two) times daily as needed for anxiety or sleep.  10/16/14   Historical Provider, MD  amLODipine (NORVASC) 5 MG tablet Take 5 mg by mouth 2 (two) times daily. Doctor increased to twice daily for 5 days. This is patients 5th day of twice daily therapy    Historical Provider, MD  benzonatate (TESSALON) 100 MG capsule Take 1 capsule (100 mg total) by mouth 3 (three) times daily as needed for cough. 11/17/14   Francine Graven, DO  budesonide-formoterol (SYMBICORT) 160-4.5 MCG/ACT inhaler Inhale 2 puffs into the lungs 2 (two) times daily. 11/25/14   Charolette Forward, MD  cholecalciferol (VITAMIN D) 1000 UNITS tablet Take 1,000 Units by mouth daily.    Historical Provider, MD  cyclobenzaprine (FLEXERIL) 10 MG tablet Take 10 mg by mouth 2 (two) times daily as needed for muscle spasms.  09/23/14   Historical Provider, MD  esomeprazole ([place]) 40  MG capsule Take 40 mg by mouth daily as needed (for heartburn).     Historical Provider, MD  loperamide (IMODIUM) 2 MG capsule Take 4 mg by mouth as needed for diarrhea or loose stools.    Historical Provider, MD  losartan (COZAAR) 100 MG tablet Take 100 mg by mouth daily. 09/20/14   Historical Provider, MD  metFORMIN (GLUCOPHAGE) 500 MG tablet Take 500 mg by mouth 2 (two) times daily. 01/06/14   Historical Provider, MD  metoprolol (LOPRESSOR) 50 MG tablet Take 50 mg by mouth 2 (two) times daily. 09/03/14   Historical Provider, MD  potassium chloride (K-DUR) 10 MEQ tablet Take 20 mEq by mouth daily.    Historical Provider, MD   rosuvastatin (CRESTOR) 10 MG tablet Take 10 mg by mouth daily.    Historical Provider, MD   BP 135/77 mmHg  Pulse 65  Temp(Src) 97.9 F (36.6 C) (Oral)  Resp 14  Ht '5\' 7"'$  (1.702 m)  Wt 219 lb (99.338 kg)  BMI 34.29 kg/m2  SpO2 100% Physical Exam  Constitutional: She is oriented to person, place, and time. She appears well-developed and well-nourished.  HENT:  Head: Normocephalic and atraumatic.  No facial asymmetry  Eyes: Conjunctivae are normal. Pupils are equal, round, and reactive to light.  Neck: Neck supple. No tracheal deviation present. No thyromegaly present.  Cardiovascular: Normal rate and regular rhythm.   No murmur heard. Pulmonary/Chest: Effort normal and breath sounds normal.  Abdominal: Soft. Bowel sounds are normal. She exhibits no distension. There is no tenderness.  Musculoskeletal: Normal range of motion. She exhibits no edema or tenderness.  Neurological: She is alert and oriented to person, place, and time. She has normal reflexes. She displays normal reflexes. No cranial nerve deficit. Coordination normal.  Cranial nerves II through XII grossly intact gait normal Romberg normal pronator drift normal finger to nose normal DTR symmetric bilaterally at knee jerk ankle jerk and biceps toes downward going bilaterally  Skin: Skin is warm and dry. No rash noted.  Psychiatric: She has a normal mood and affect.  Nursing note and vitals reviewed.   ED Course  Procedures (including critical care time) Labs Review Labs Reviewed  CBG MONITORING, ED - Abnormal; Notable for the following:    Glucose-Capillary 163 (*)    All other components within normal limits  I-STAT CHEM 8, ED    Imaging Review No results found.   EKG Interpretation   Date/Time:  Wednesday March 13 2015 11:29:09 EDT Ventricular Rate:  64 PR Interval:  184 QRS Duration: 89 QT Interval:  428 QTC Calculation: 442 R Axis:   -22 Text Interpretation:  Sinus rhythm Consider left atrial  enlargement LVH by  voltage No significant change since last tracing Confirmed by Winfred Leeds   MD, Hyder Deman [phone]) on 03/13/2015 11:52:18 AM     2:45 PM patient alert Glasgow Coma Score 15. States paresthesias are improved. Results for orders placed or performed during the hospital encounter of 03/13/15  CBG monitoring, ED  Result Value Ref Range   Glucose-Capillary 163 (H) 70 - 99 mg/dL  I-stat chem 8, ed  Result Value Ref Range   Sodium 139 135 - 145 mmol/L   Potassium 4.2 3.5 - 5.1 mmol/L   Chloride 102 96 - 112 mmol/L   BUN 18 6 - 23 mg/dL   Creatinine, Ser 1.00 0.50 - 1.10 mg/dL   Glucose, Bld 176 (H) 70 - 99 mg/dL   Calcium, Ion 1.15 1.13 - 1.30 mmol/L   TCO2  23 0 - 100 mmol/L   Hemoglobin 16.0 (H) 12.0 - 15.0 g/dL   HCT 47.0 (H) 36.0 - 46.0 %   Mr Brain Wo Contrast  03/13/2015   CLINICAL DATA:  Blurred vision and dizziness for 1-2 weeks. LEFT-sided headaches.  EXAM: MRI HEAD WITHOUT CONTRAST  TECHNIQUE: Multiplanar, multiecho pulse sequences of the brain and surrounding structures were obtained without intravenous contrast.  COMPARISON:  CT head is 06/17/2013.  MR brain 04/07/2012  FINDINGS: No evidence for acute infarction, hemorrhage, mass lesion, hydrocephalus, or extra-axial fluid. Mild cerebral and cerebellar atrophy with prominent sylvian fissures, suggesting disproportionate temporal lobe volume loss. This has slightly progressed in 2013.  Mild subcortical and periventricular T2 and FLAIR hyperintensities, likely chronic microvascular ischemic change.  Flow voids are maintained throughout the carotid, basilar, and vertebral arteries. There are no areas of chronic hemorrhage.  Marked empty sella. The gland is flattened against the floor, and the sella is expanded. This can be associated with a wide variety of nonspecific complaints including headache. No herniation of the optic chiasm downward. No tonsillar herniation. Incidental pineal cyst.  Visualized calvarium, skull base, and  upper cervical osseous structures unremarkable. Scalp and extracranial soft tissues, orbits, sinuses, and mastoids show no acute process.  IMPRESSION: Empty sella. Similar appearance to 2013. This can be associated with a wide variety of nonspecific complaints, including headache, but [place] also reflect an incidental finding.  Slight progression of generalized atrophy, with temporal lobe predominance.  Mild subcortical and periventricular T2 and FLAIR hyperintensities, likely chronic microvascular ischemic change.   Electronically Signed   By: Rolla Flatten M.D.   On: 03/13/2015 13:54    MDM  Clinically patient exhibits no signs of stroke. Nonfocal neurologic exam PLan follow-up with Dr.Kadakia Diagnoses #1 paresthesias #2 hyperglycemia Final diagnoses:  Numbness        Orlie Dakin, MD 03/13/15 1453  Orlie Dakin, MD 03/22/15 1434

## 2015-03-13 NOTE — ED Notes (Signed)
Pt presents with 20 minute h/o tingling to both sides of her face, L hand and both feet.  Pt reports onset of shortness of breath at same time, denies pain or cough.

## 2015-03-17 ENCOUNTER — Encounter (HOSPITAL_COMMUNITY): Payer: Self-pay | Admitting: Emergency Medicine

## 2015-03-17 ENCOUNTER — Emergency Department (HOSPITAL_COMMUNITY)
Admission: EM | Admit: 2015-03-17 | Discharge: 2015-03-18 | Disposition: A | Discharge status: Home / Self Care | Payer: Medicare HMO | Attending: Emergency Medicine | Admitting: Emergency Medicine

## 2015-03-17 DIAGNOSIS — E119 Type 2 diabetes mellitus without complications: Secondary | ICD-10-CM | POA: Insufficient documentation

## 2015-03-17 DIAGNOSIS — Z90710 Acquired absence of both cervix and uterus: Secondary | ICD-10-CM | POA: Insufficient documentation

## 2015-03-17 DIAGNOSIS — Z9049 Acquired absence of other specified parts of digestive tract: Secondary | ICD-10-CM | POA: Insufficient documentation

## 2015-03-17 DIAGNOSIS — Z8739 Personal history of other diseases of the musculoskeletal system and connective tissue: Secondary | ICD-10-CM | POA: Insufficient documentation

## 2015-03-17 DIAGNOSIS — R300 Dysuria: Secondary | ICD-10-CM

## 2015-03-17 DIAGNOSIS — G8929 Other chronic pain: Secondary | ICD-10-CM | POA: Diagnosis not present

## 2015-03-17 DIAGNOSIS — R1013 Epigastric pain: Secondary | ICD-10-CM

## 2015-03-17 DIAGNOSIS — K219 Gastro-esophageal reflux disease without esophagitis: Secondary | ICD-10-CM | POA: Diagnosis not present

## 2015-03-17 DIAGNOSIS — Z720 Tobacco use: Secondary | ICD-10-CM | POA: Insufficient documentation

## 2015-03-17 DIAGNOSIS — Z79899 Other long term (current) drug therapy: Secondary | ICD-10-CM | POA: Insufficient documentation

## 2015-03-17 DIAGNOSIS — R1030 Lower abdominal pain, unspecified: Secondary | ICD-10-CM | POA: Diagnosis present

## 2015-03-17 LAB — URINALYSIS, ROUTINE W REFLEX MICROSCOPIC
Bilirubin Urine: NEGATIVE
GLUCOSE, UA: NEGATIVE mg/dL
Hgb urine dipstick: NEGATIVE
Ketones, ur: NEGATIVE mg/dL
Leukocytes, UA: NEGATIVE
Nitrite: NEGATIVE
Protein, ur: NEGATIVE mg/dL
SPECIFIC GRAVITY, URINE: 1.006 (ref 1.005–1.030)
Urobilinogen, UA: 0.2 mg/dL (ref 0.0–1.0)
pH: 5.5 (ref 5.0–8.0)

## 2015-03-17 LAB — COMPREHENSIVE METABOLIC PANEL
ALT: 13 U/L (ref 0–35)
AST: 24 U/L (ref 0–37)
Albumin: 4.2 g/dL (ref 3.5–5.2)
Alkaline Phosphatase: 110 U/L (ref 39–117)
Anion gap: 7 (ref 5–15)
BILIRUBIN TOTAL: 0.6 mg/dL (ref 0.3–1.2)
BUN: 12 mg/dL (ref 6–23)
CHLORIDE: 105 mmol/L (ref 96–112)
CO2: 25 mmol/L (ref 19–32)
Calcium: 9.3 mg/dL (ref 8.4–10.5)
Creatinine, Ser: 1.02 mg/dL (ref 0.50–1.10)
GFR calc Af Amer: 64 mL/min — ABNORMAL LOW (ref >90)
GFR calc non Af Amer: 56 mL/min — ABNORMAL LOW (ref >90)
Glucose, Bld: 95 mg/dL (ref 70–99)
Potassium: 3.9 mmol/L (ref 3.5–5.1)
SODIUM: 137 mmol/L (ref 135–145)
TOTAL PROTEIN: 7.9 g/dL (ref 6.0–8.3)

## 2015-03-17 LAB — CBC WITH DIFFERENTIAL/PLATELET
BASOS ABS: 0.1 10*3/uL (ref 0.0–0.1)
Basophils Relative: 1 % (ref 0–1)
Eosinophils Absolute: 0.6 10*3/uL (ref 0.0–0.7)
Eosinophils Relative: 6 % — ABNORMAL HIGH (ref 0–5)
HCT: 41.2 % (ref 36.0–46.0)
HEMOGLOBIN: 13.7 g/dL (ref 12.0–15.0)
LYMPHS PCT: 52 % — AB (ref 12–46)
Lymphs Abs: 5 10*3/uL — ABNORMAL HIGH (ref 0.7–4.0)
MCH: 30.7 pg (ref 26.0–34.0)
MCHC: 33.3 g/dL (ref 30.0–36.0)
MCV: 92.4 fL (ref 78.0–100.0)
MONO ABS: 0.5 10*3/uL (ref 0.1–1.0)
Monocytes Relative: 5 % (ref 3–12)
Neutro Abs: 3.5 10*3/uL (ref 1.7–7.7)
Neutrophils Relative %: 36 % — ABNORMAL LOW (ref 43–77)
Platelets: 375 10*3/uL (ref 150–400)
RBC: 4.46 MIL/uL (ref 3.87–5.11)
RDW: 12.8 % (ref 11.5–15.5)
WBC: 9.7 10*3/uL (ref 4.0–10.5)

## 2015-03-17 LAB — LIPASE, BLOOD: Lipase: 50 U/L (ref 11–59)

## 2015-03-17 NOTE — ED Notes (Addendum)
Pt was seen "a few days ago" at [facility] for HTN/numbness/tingling in face. Had MRI completed of brain. No abnormal results. Reports bilateral flank pain and itching/burning with urination and constantly. Also c/o lower abdominal pain. Has tried Monistat, AZO, cranberry juice with no alleviation of symptoms. Denies emesis or diarrhea but has had nausea. Denies fevers. Reports dark yellow discharge. No other c/c. Denies new/changed sexual encounters.

## 2015-03-18 LAB — GC/CHLAMYDIA PROBE AMP (~~LOC~~) NOT AT ARMC
Chlamydia: NEGATIVE
Neisseria Gonorrhea: NEGATIVE

## 2015-03-18 LAB — WET PREP, GENITAL
Clue Cells Wet Prep HPF POC: NONE SEEN
TRICH WET PREP: NONE SEEN
Yeast Wet Prep HPF POC: NONE SEEN

## 2015-03-18 MED ORDER — SOLIFENACIN SUCCINATE 5 MG PO TABS: 5.0000 mg | ORAL_TABLET | Freq: Every day | ORAL | Status: DC

## 2015-03-18 MED ORDER — SUCRALFATE 1 G PO TABS: 1.0000 g | ORAL_TABLET | Freq: Four times a day (QID) | ORAL | Status: DC

## 2015-03-18 NOTE — Discharge Instructions (Signed)
Your workup today has not shown a specific cause for your symptoms.  It is important for you to follow-up with her primary care doctor and/or your gynecologist.  Take medications as prescribed.  Drink plenty of fluids.   Dysuria Dysuria is the medical term for pain with urination. There are many causes for dysuria, but urinary tract infection is the most common. If a urinalysis was performed it can show that there is a urinary tract infection. A urine culture confirms that you or your child is sick. You [name] need to follow up with a healthcare provider because:  If a urine culture was done you [name] need to know the culture results and treatment recommendations.  If the urine culture was positive, you or your child [name] need to be put on antibiotics or know if the antibiotics prescribed are the right antibiotics for your urinary tract infection.  If the urine culture is negative (no urinary tract infection), then other causes [name] need to be explored or antibiotics need to be stopped. Today laboratory work [name] have been done and there does not seem to be an infection. If cultures were done they [name] take at least 24 to 48 hours to be completed. Today x-rays [name] have been taken and they read as normal. No cause can be found for the problems. The x-rays [name] be re-read by a radiologist and you [name] be contacted if additional findings are made. You or your child [name] have been put on medications to help with this problem until you can see your primary caregiver. If the problems get better, see your primary caregiver if the problems return. If you were given antibiotics (medications which kill germs), take all of the mediations as directed for the full course of treatment.  If laboratory work was done, you need to find the results. Leave a telephone number where you can be reached. If this is not possible, make sure you find out how you are to get test results. HOME CARE INSTRUCTIONS   Drink lots of fluids.  For adults, drink eight, 8 ounce glasses of clear juice or water a day. For children, replace fluids as suggested by your caregiver.  Empty the bladder often. Avoid holding urine for long periods of time.  After a bowel movement, women should cleanse front to back, using each tissue only once.  Empty your bladder before and after sexual intercourse.  Take all the medicine given to you until it is gone. You [name] feel better in a few days, but TAKE ALL MEDICINE.  Avoid caffeine, tea, alcohol and carbonated beverages, because they tend to irritate the bladder.  In men, alcohol [name] irritate the prostate.  Only take over-the-counter or prescription medicines for pain, discomfort, or fever as directed by your caregiver.  If your caregiver has given you a follow-up appointment, it is very important to keep that appointment. Not keeping the appointment could result in a chronic or permanent injury, pain, and disability. If there is any problem keeping the appointment, you must call back to this facility for assistance. SEEK IMMEDIATE MEDICAL CARE IF:   Back pain develops.  A fever develops.  There is nausea (feeling sick to your stomach) or vomiting (throwing up).  Problems are no better with medications or are getting worse. MAKE SURE YOU:   Understand these instructions.  [name] watch your condition.  [name] get help right away if you are not doing well or get worse. Document Released: 08/07/2004 Document Revised: 02/01/2012 Document Reviewed:  06/14/2008 [employer] Patient Information 2015 [name], [place]. This information is not intended to replace advice given to you by your health care provider. Make sure you discuss any questions you have with your health care provider.  Abdominal Pain Many things can cause abdominal pain. Usually, abdominal pain is not caused by a disease and [name] improve without treatment. It can often be observed and treated at home. Your health care provider will do a  physical exam and possibly order blood tests and X-rays to help determine the seriousness of your pain. However, in many cases, more time must pass before a clear cause of the pain can be found. Before that point, your health care provider [name] not know if you need more testing or further treatment. HOME CARE INSTRUCTIONS  Monitor your abdominal pain for any changes. The following actions [name] help to alleviate any discomfort you are experiencing:  Only take over-the-counter or prescription medicines as directed by your health care provider.  Do not take laxatives unless directed to do so by your health care provider.  Try a clear liquid diet (broth, tea, or water) as directed by your health care provider. Slowly move to a bland diet as tolerated. SEEK MEDICAL CARE IF:  You have unexplained abdominal pain.  You have abdominal pain associated with nausea or diarrhea.  You have pain when you urinate or have a bowel movement.  You experience abdominal pain that wakes you in the night.  You have abdominal pain that is worsened or improved by eating food.  You have abdominal pain that is worsened with eating fatty foods.  You have a fever. SEEK IMMEDIATE MEDICAL CARE IF:   Your pain does not go away within 2 hours.  You keep throwing up (vomiting).  Your pain is felt only in portions of the abdomen, such as the right side or the left lower portion of the abdomen.  You pass bloody or black tarry stools. MAKE SURE YOU:  Understand these instructions.   [name] watch your condition.   [name] get help right away if you are not doing well or get worse.  Document Released: 08/19/2005 Document Revised: 11/14/2013 Document Reviewed: 07/19/2013 [name] [facility] Patient Information 2015 [place], [place]. This information is not intended to replace advice given to you by your health care provider. Make sure you discuss any questions you have with your health care provider.  Pain of Unknown Etiology  (Pain Without a Known Cause) You have come to your caregiver because of pain. Pain can occur in any part of the body. Often there is not a definite cause. If your laboratory (blood or urine) work was normal and X-rays or other studies were normal, your caregiver [name] treat you without knowing the cause of the pain. An example of this is the headache. Most headaches are diagnosed by taking a history. This means your caregiver asks you questions about your headaches. Your caregiver determines a treatment based on your answers. Usually testing done for headaches is normal. Often testing is not done unless there is no response to medications. Regardless of where your pain is located today, you can be given medications to make you comfortable. If no physical cause of pain can be found, most cases of pain [name] gradually leave as suddenly as they came.  If you have a painful condition and no reason can be found for the pain, it is important that you follow up with your caregiver. If the pain becomes worse or does not go away, it [name]  be necessary to repeat tests and look further for a possible cause.  Only take over-the-counter or prescription medicines for pain, discomfort, or fever as directed by your caregiver.  For the protection of your privacy, test results cannot be given over the phone. Make sure you receive the results of your test. Ask how these results are to be obtained if you have not been informed. It is your responsibility to obtain your test results.  You [name] continue all activities unless the activities cause more pain. When the pain lessens, it is important to gradually resume normal activities. Resume activities by beginning slowly and gradually increasing the intensity and duration of the activities or exercise. During periods of severe pain, bed rest [name] be helpful. Lie or sit in any position that is comfortable.  Ice used for acute (sudden) conditions [name] be effective. Use a large plastic bag  filled with ice and wrapped in a towel. This [name] provide pain relief.  See your caregiver for continued problems. Your caregiver can help or refer you for exercises or physical therapy if necessary. If you were given medications for your condition, do not drive, operate machinery or power tools, or sign legal documents for 24 hours. Do not drink alcohol, take sleeping pills, or take other medications that [place] interfere with treatment. See your caregiver immediately if you have pain that is becoming worse and not relieved by medications. Document Released: 08/04/2001 Document Revised: 08/30/2013 Document Reviewed: 11/09/2005 [employer] Patient Information 2015 [place], [place]. This information is not intended to replace advice given to you by your health care provider. Make sure you discuss any questions you have with your health care provider.

## 2015-03-18 NOTE — ED Provider Notes (Signed)
CSN: [id number]     Arrival date & time 03/17/15  1858 History   First MD Initiated Contact with Patient 03/17/15 2302     Chief Complaint  Patient presents with  . Abdominal Pain    lower  . Dysuria  . Flank Pain    bilateral     (Consider location/radiation/quality/duration/timing/severity/associated sxs/prior Treatment) HPI 69 year old female presents to the emergency department with complaint of dysuria ongoing for the last week.  Patient reports she has tried Monistat for 4 days and Azo for 3 days.  She reports a dull pain in her lower abdomen as well as pain in her upper abdomen and bilateral flanks.  No fevers or chills.  No new sexual partners.  No vaginal discharge.  Patient reports no improvement with Monistat or AZO. Past Medical History  Diagnosis Date  . Diabetes mellitus   . Hypertension   . GERD (gastroesophageal reflux disease)   . Herniated disc   . Arthritis   . Shortness of breath on exertion 11/10/11    "sometimes"  . H/O hiatal hernia   . Headache(784.0)   . Hemorrhoids   . Chronic back pain greater than 3 months duration   . Vertigo   . Chronic neck pain   . Hx of echocardiogram 2010    normal EF   Past Surgical History  Procedure Laterality Date  . Knee surgery      right  . Shoulder open rotator cuff repair  07/20/2011    left  . Fracture surgery  2004?    right shoulder  . Knee arthroscopy  2004    right  . Tonsillectomy    . Cholecystectomy  ~ 2008  . Abdominal hysterectomy  1997?    "partial"  . Neck fusion     History reviewed. No pertinent family history. History  Substance Use Topics  . Smoking status: Current Some Day Smoker -- 0.20 packs/day for 40 years    Types: Cigarettes  . Smokeless tobacco: Never Used     Comment: smoking cessation consult entered  . Alcohol Use: No   OB History    No data available     Review of Systems   See History of Present Illness; otherwise all other systems are reviewed and  negative  Allergies  Oxycodone  Home Medications   Prior to Admission medications   Medication Sig Start Date End Date Taking? Authorizing Provider  ALPRAZolam (XANAX) 0.25 MG tablet Take 0.25 mg by mouth 2 (two) times daily as needed for anxiety or sleep.  10/16/14  Yes Historical Provider, MD  amLODipine (NORVASC) 5 MG tablet Take 5 mg by mouth 2 (two) times daily. Doctor increased to twice daily for 5 days. This is patients 5th day of twice daily therapy   Yes Historical Provider, MD  budesonide-formoterol (SYMBICORT) 160-4.5 MCG/ACT inhaler Inhale 2 puffs into the lungs 2 (two) times daily. Patient taking differently: Inhale 2 puffs into the lungs 2 (two) times daily as needed (wheezing).  11/25/14  Yes Charolette Forward, MD  Cranberry-Vitamin C-Probiotic (AZO CRANBERRY PO) Take 2 tablets by mouth 2 (two) times daily as needed (buring).   Yes Historical Provider, MD  esomeprazole (NEXIUM) 40 MG capsule Take 40 mg by mouth daily as needed (for heartburn).    Yes Historical Provider, MD  losartan (COZAAR) 100 MG tablet Take 100 mg by mouth daily. 09/20/14  Yes Historical Provider, MD  metFORMIN (GLUCOPHAGE) 500 MG tablet Take 500 mg by mouth 2 (two) times daily.  01/06/14  Yes Historical Provider, MD  metoprolol (LOPRESSOR) 50 MG tablet Take 50 mg by mouth 2 (two) times daily. 09/03/14  Yes Historical Provider, MD  Miconazole Nitrate-Wipes (MONISTAT 7 COMPLETE THERAPY) 100-2 MG-% KIT Place 1 application vaginally at bedtime as needed (buring/yeast).   Yes Historical Provider, MD  potassium chloride (K-DUR) 10 MEQ tablet Take 20 mEq by mouth daily.   Yes Historical Provider, MD  benzonatate (TESSALON) 100 MG capsule Take 1 capsule (100 mg total) by mouth 3 (three) times daily as needed for cough. Patient not taking: Reported on 03/13/2015 11/17/14   Francine Graven, DO   BP 113/63 mmHg  Pulse 59  Temp(Src) 97.6 F (36.4 C) (Oral)  Resp 18  SpO2 99% Physical Exam  Constitutional: She is  oriented to person, place, and time. She appears well-developed and well-nourished.  HENT:  Head: Normocephalic and atraumatic.  Nose: Nose normal.  Mouth/Throat: Oropharynx is clear and moist.  Eyes: Conjunctivae and EOM are normal. Pupils are equal, round, and reactive to light.  Neck: Normal range of motion. Neck supple. No JVD present. No tracheal deviation present. No thyromegaly present.  Cardiovascular: Normal rate, regular rhythm, normal heart sounds and intact distal pulses.  Exam reveals no gallop and no friction rub.   No murmur heard. Pulmonary/Chest: Effort normal and breath sounds normal. No stridor. No respiratory distress. She has no wheezes. She has no rales. She exhibits no tenderness.  Abdominal: Soft. Bowel sounds are normal. She exhibits no distension and no mass. There is tenderness (tender to palpation in epigastrium that rebound or guarding). There is no rebound and no guarding.  Genitourinary:  External genitalia within normal limits Vagina with small amount of white discharge Cervix  normal negative for cervical motion tenderness Adnexa palpated, no masses or negative for tenderness noted Bladder palpated negative for tenderness Uterus palpated no masses or negative for tenderness    Musculoskeletal: Normal range of motion. She exhibits no edema or tenderness.  Lymphadenopathy:    She has no cervical adenopathy.  Neurological: She is alert and oriented to person, place, and time. She displays normal reflexes. She exhibits normal muscle tone. Coordination normal.  Skin: Skin is warm and dry. No rash noted. No erythema. No pallor.  Psychiatric: She has a normal mood and affect. Her behavior is normal. Judgment and thought content normal.  Nursing note and vitals reviewed.   ED Course  Procedures (including critical care time) Labs Review Labs Reviewed  WET PREP, GENITAL - Abnormal; Notable for the following:    WBC, Wet Prep HPF POC FEW (*)    All other  components within normal limits  CBC WITH DIFFERENTIAL/PLATELET - Abnormal; Notable for the following:    Neutrophils Relative % 36 (*)    Lymphocytes Relative 52 (*)    Lymphs Abs 5.0 (*)    Eosinophils Relative 6 (*)    All other components within normal limits  COMPREHENSIVE METABOLIC PANEL - Abnormal; Notable for the following:    GFR calc non Af Amer 56 (*)    GFR calc Af Amer 64 (*)    All other components within normal limits  URINALYSIS, ROUTINE W REFLEX MICROSCOPIC - Abnormal; Notable for the following:    APPearance CLOUDY (*)    All other components within normal limits  LIPASE, BLOOD  GC/CHLAMYDIA PROBE AMP ([place])    Imaging Review No results found.   EKG Interpretation None      MDM   Final diagnoses:  Dysuria  Epigastric pain    69 year old female with dysuria.  Urine is negative, wet prep without findings.  Will refer patient to primary care doctor and to GYN for further workup.    Linton Flemings, MD 03/18/15 [phone]

## 2015-03-21 ENCOUNTER — Emergency Department (INDEPENDENT_AMBULATORY_CARE_PROVIDER_SITE_OTHER)
Admission: EM | Admit: 2015-03-21 | Discharge: 2015-03-21 | Disposition: A | Discharge status: Home / Self Care | Payer: Medicare HMO | Source: Home / Self Care | Attending: Emergency Medicine | Admitting: Emergency Medicine

## 2015-03-21 ENCOUNTER — Encounter (HOSPITAL_COMMUNITY): Payer: Self-pay | Admitting: Emergency Medicine

## 2015-03-21 DIAGNOSIS — R350 Frequency of micturition: Secondary | ICD-10-CM

## 2015-03-21 DIAGNOSIS — M940 Chondrocostal junction syndrome [Tietze]: Secondary | ICD-10-CM

## 2015-03-21 DIAGNOSIS — R10816 Epigastric abdominal tenderness: Secondary | ICD-10-CM | POA: Diagnosis not present

## 2015-03-21 DIAGNOSIS — R300 Dysuria: Secondary | ICD-10-CM

## 2015-03-21 DIAGNOSIS — R102 Pelvic and perineal pain: Secondary | ICD-10-CM

## 2015-03-21 LAB — POCT URINALYSIS DIP (DEVICE)
Bilirubin Urine: NEGATIVE
GLUCOSE, UA: NEGATIVE mg/dL
Ketones, ur: NEGATIVE mg/dL
Leukocytes, UA: NEGATIVE
Nitrite: NEGATIVE
Protein, ur: NEGATIVE mg/dL
SPECIFIC GRAVITY, URINE: 1.015 (ref 1.005–1.030)
Urobilinogen, UA: 0.2 mg/dL (ref 0.0–1.0)
pH: 6 (ref 5.0–8.0)

## 2015-03-21 MED ORDER — CEPHALEXIN 500 MG PO CAPS: 500.0000 mg | ORAL_CAPSULE | Freq: Four times a day (QID) | ORAL | Status: DC

## 2015-03-21 NOTE — ED Notes (Signed)
C/o  Urinary frequency.  Burning.  Lower back pain.   States recently being treated but has finished meds with having no relief in symptoms.    Symptoms present x 2 1/2 weeks.

## 2015-03-21 NOTE — Discharge Instructions (Signed)
Abdominal Pain, Women °Abdominal (stomach, pelvic, or belly) pain can be caused by many things. It is important to tell your doctor: °· The location of the pain. °· Does it come and go or is it present all the time? °· Are there things that start the pain (eating certain foods, exercise)? °· Are there other symptoms associated with the pain (fever, nausea, vomiting, diarrhea)? °All of this is helpful to know when trying to find the cause of the pain. °CAUSES  °· Stomach: virus or bacteria infection, or ulcer. °· Intestine: appendicitis (inflamed appendix), regional ileitis (Crohn's disease), ulcerative colitis (inflamed colon), irritable bowel syndrome, diverticulitis (inflamed diverticulum of the colon), or cancer of the stomach or intestine. °· Gallbladder disease or stones in the gallbladder. °· Kidney disease, kidney stones, or infection. °· Pancreas infection or cancer. °· Fibromyalgia (pain disorder). °· Diseases of the female organs: °· Uterus: fibroid (non-cancerous) tumors or infection. °· Fallopian tubes: infection or tubal pregnancy. °· Ovary: cysts or tumors. °· Pelvic adhesions (scar tissue). °· Endometriosis (uterus lining tissue growing in the pelvis and on the pelvic organs). °· Pelvic congestion syndrome (female organs filling up with blood just before the menstrual period). °· Pain with the menstrual period. °· Pain with ovulation (producing an egg). °· Pain with an IUD (intrauterine device, birth control) in the uterus. °· Cancer of the female organs. °· Functional pain (pain not caused by a disease, [name] improve without treatment). °· Psychological pain. °· Depression. °DIAGNOSIS  °Your doctor will decide the seriousness of your pain by doing an examination. °· Blood tests. °· X-rays. °· Ultrasound. °· CT scan (computed tomography, special type of X-ray). °· MRI (magnetic resonance imaging). °· Cultures, for infection. °· Barium enema (dye inserted in the large intestine, to better view it with  X-rays). °· Colonoscopy (looking in intestine with a lighted tube). °· Laparoscopy (minor surgery, looking in abdomen with a lighted tube). °· Major abdominal exploratory surgery (looking in abdomen with a large incision). °TREATMENT  °The treatment will depend on the cause of the pain.  °· Many cases can be observed and treated at home. °· Over-the-counter medicines recommended by your caregiver. °· Prescription medicine. °· Antibiotics, for infection. °· Birth control pills, for painful periods or for ovulation pain. °· Hormone treatment, for endometriosis. °· Nerve blocking injections. °· Physical therapy. °· Antidepressants. °· Counseling with a psychologist or psychiatrist. °· Minor or major surgery. °HOME CARE INSTRUCTIONS  °· Do not take laxatives, unless directed by your caregiver. °· Take over-the-counter pain medicine only if ordered by your caregiver. Do not take aspirin because it can cause an upset stomach or bleeding. °· Try a clear liquid diet (broth or water) as ordered by your caregiver. Slowly move to a bland diet, as tolerated, if the pain is related to the stomach or intestine. °· Have a thermometer and take your temperature several times a day, and record it. °· Bed rest and sleep, if it helps the pain. °· Avoid sexual intercourse, if it causes pain. °· Avoid stressful situations. °· Keep your follow-up appointments and tests, as your caregiver orders. °· If the pain does not go away with medicine or surgery, you [name] try: °· Acupuncture. °· Relaxation exercises (yoga, meditation). °· Group therapy. °· Counseling. °SEEK MEDICAL CARE IF:  °· You notice certain foods cause stomach pain. °· Your home care treatment is not helping your pain. °· You need stronger pain medicine. °· You want your IUD removed. °· You feel faint or   lightheaded.  You develop nausea and vomiting.  You develop a rash.  You are having side effects or an allergy to your medicine. SEEK IMMEDIATE MEDICAL CARE IF:   Your  pain does not go away or gets worse.  You have a fever.  Your pain is felt only in portions of the abdomen. The right side could possibly be appendicitis. The left lower portion of the abdomen could be colitis or diverticulitis.  You are passing blood in your stools (bright red or black tarry stools, with or without vomiting).  You have blood in your urine.  You develop chills, with or without a fever.  You pass out. MAKE SURE YOU:   Understand these instructions.  Will watch your condition.  Will get help right away if you are not doing well or get worse. Document Released: 09/06/2007 Document Revised: 03/26/2014 Document Reviewed: 09/26/2009 [facility] Patient Information 2015 [place], [place]. This information is not intended to replace advice given to you by your health care provider. Make sure you discuss any questions you have with your health care provider.  Costochondritis Costochondritis is a condition in which the tissue (cartilage) that connects your ribs with your breastbone (sternum) becomes irritated. It causes pain in the chest and rib area. It usually goes away on its own over time. HOME CARE  Avoid activities that wear you out.  Do not strain your ribs. Avoid activities that use your:  Chest.  Belly.  Side muscles.  Put ice on the area for the first 2 days after the pain starts.  Put ice in a plastic bag.  Place a towel between your skin and the bag.  Leave the ice on for 20 minutes, 2-3 times a day.  Only take medicine as told by your doctor. GET HELP IF:  You have redness or puffiness (swelling) in the rib area.  Your pain does not go away with rest or medicine. GET HELP RIGHT AWAY IF:   Your pain gets worse.  You are very uncomfortable.  You have trouble breathing.  You cough up blood.  You start sweating or throwing up (vomiting).  You have a fever or lasting symptoms for more than 2-3 days.  You have a fever and your symptoms  suddenly get worse. MAKE SURE YOU:   Understand these instructions.  Will watch your condition.  Will get help right away if you are not doing well or get worse. Document Released: 04/27/2008 Document Revised: 07/12/2013 Document Reviewed: 06/13/2013 [facility] Patient Information 2015 [place], [place]. This information is not intended to replace advice given to you by your health care provider. Make sure you discuss any questions you have with your health care provider.  Dysuria Dysuria is the medical term for pain with urination. There are many causes for dysuria, but urinary tract infection is the most common. If a urinalysis was performed it can show that there is a urinary tract infection. A urine culture confirms that you or your child is sick. You will need to follow up with a healthcare provider because:  If a urine culture was done you will need to know the culture results and treatment recommendations.  If the urine culture was positive, you or your child will need to be put on antibiotics or know if the antibiotics prescribed are the right antibiotics for your urinary tract infection.  If the urine culture is negative (no urinary tract infection), then other causes Keyra need to be explored or antibiotics need to be stopped. Today  laboratory work [name] have been done and there does not seem to be an infection. If cultures were done they will take at least 24 to 48 hours to be completed. Today x-rays [name] have been taken and they read as normal. No cause can be found for the problems. The x-rays [name] be re-read by a radiologist and you will be contacted if additional findings are made. You or your child [name] have been put on medications to help with this problem until you can see your primary caregiver. If the problems get better, see your primary caregiver if the problems return. If you were given antibiotics (medications which kill germs), take all of the mediations as directed for the full  course of treatment.  If laboratory work was done, you need to find the results. Leave a telephone number where you can be reached. If this is not possible, make sure you find out how you are to get test results. HOME CARE INSTRUCTIONS   Drink lots of fluids. For adults, drink eight, 8 ounce glasses of clear juice or water a day. For children, replace fluids as suggested by your caregiver.  Empty the bladder often. Avoid holding urine for long periods of time.  After a bowel movement, women should cleanse front to back, using each tissue only once.  Empty your bladder before and after sexual intercourse.  Take all the medicine given to you until it is gone. You [name] feel better in a few days, but TAKE ALL MEDICINE.  Avoid caffeine, tea, alcohol and carbonated beverages, because they tend to irritate the bladder.  In men, alcohol [name] irritate the prostate.  Only take over-the-counter or prescription medicines for pain, discomfort, or fever as directed by your caregiver.  If your caregiver has given you a follow-up appointment, it is very important to keep that appointment. Not keeping the appointment could result in a chronic or permanent injury, pain, and disability. If there is any problem keeping the appointment, you must call back to this facility for assistance. SEEK IMMEDIATE MEDICAL CARE IF:   Back pain develops.  A fever develops.  There is nausea (feeling sick to your stomach) or vomiting (throwing up).  Problems are no better with medications or are getting worse. MAKE SURE YOU:   Understand these instructions.  Will watch your condition.  Will get help right away if you are not doing well or get worse. Document Released: 08/07/2004 Document Revised: 02/01/2012 Document Reviewed: 06/14/2008 [facility] Patient Information 2015 [place], [place]. This information is not intended to replace advice given to you by your health care provider. Make sure you discuss any questions  you have with your health care provider.  Interstitial Cystitis Interstitial cystitis (IC) is a condition that results in discomfort or pain in the bladder and the surrounding pelvic region. The symptoms can be different from case to case and even in the same individual. People [place] experience:  Mild discomfort.  Pressure.  Tenderness.  Intense pain in the bladder and pelvic area. CAUSES  Because IC varies so much in symptoms and severity, people studying this disease believe it is not one but several diseases. Some caregivers use the term painful bladder syndrome (PBS) to describe cases with painful urinary symptoms. This [name] not meet the strictest definition of IC. The term IC / PBS includes all cases of urinary pain that cannot be connected to other causes, such as infection or urinary stones.  SYMPTOMS  Symptoms [name] include:  An urgent need to urinate.  A frequent need to urinate.  A combination of these symptoms. Pain [name] change in intensity as the bladder fills with urine or as it empties. Women's symptoms often get worse during menstruation. They [name] sometimes experience pain with vaginal intercourse. Some of the symptoms of IC / PBS seem like those of bacterial infection. Tests do not show infection. IC / PBS is far more common in women than in men.  DIAGNOSIS  The diagnosis of IC / PBS is based on:  Presence of pain related to the bladder, usually along with problems of frequency and urgency.  Not finding other diseases that could cause the symptoms.  Diagnostic tests that help rule out other diseases include:  Urinalysis.  Urine culture.  Cystoscopy.  Biopsy of the bladder wall.  Distension of the bladder under anesthesia.  Urine cytology.  Laboratory examination of prostate secretions. A biopsy is a tissue sample that can be looked at under a microscope. Samples of the bladder and urethra [name] be removed during a cystoscopy. A biopsy helps rule out bladder  cancer. TREATMENT  Scientists have not yet found a cure for IC / PBS. Patients with IC / PBS do not get better with antibiotic therapy. Caregivers cannot predict who will respond best to which treatment. Symptoms [name] disappear without explanation. Disappearing symptoms [name] coincide with an event such as a change in diet or treatment. Even when symptoms disappear, they [name] return after days, weeks, months, or years.  Because the causes of IC / PBS are unknown, current treatments are aimed at relieving symptoms. Many people are helped by one or a combination of the treatments. As researchers learn more about IC / PBS, the list of potential treatments will change. Patients should discuss their options with a caregiver. SURGERY  Surgery should be considered only if all available treatments have failed and the pain is disabling. Many approaches and techniques are used. Each approach has its own advantages and complications. Advantages and complications should be discussed with a urologist. Your caregiver [name] recommend consulting another urologist for a second opinion. Most caregivers are reluctant to operate because the outcome is unpredictable. Some people still have symptoms after surgery.  People considering surgery should discuss the potential risks and benefits, side effects, and long- and short-term complications with their family, as well as with people who have already had the procedure. Surgery requires anesthesia, hospitalization, and in some cases weeks or months of recovery. As the complexity of the procedure increases, so do the chances for complications and for failure. HOME CARE INSTRUCTIONS   All drugs, even those sold over the counter, have side effects. Patients should always consult a caregiver before using any drug for an extended amount of time. Only take over-the-counter or prescription medicines for pain, discomfort, or fever as directed by your caregiver.  Many patients feel that  smoking makes their symptoms worse. How the by-products of tobacco that are excreted in the urine affect IC / PBS is unknown. Smoking is the major known cause of bladder cancer. One of the best things smokers can do for their bladder and their overall health is to quit.  Many patients feel that gentle stretching exercises help relieve IC / PBS symptoms.  Methods vary, but basically patients decide to empty their bladder at designated times and use relaxation techniques and distractions to keep to the schedule. Gradually, patients try to lengthen the time between scheduled voids. A diary in which to record voiding times is usually helpful in keeping track  of progress. MAKE SURE YOU:   Understand these instructions.  Will watch your condition.  Will get help right away if you are not doing well or get worse. Document Released: 07/10/2004 Document Revised: 02/01/2012 Document Reviewed: 09/24/2008 [facility] Patient Information 2015 [place], [place]. This information is not intended to replace advice given to you by your health care provider. Make sure you discuss any questions you have with your health care provider.  Urinary Frequency The number of times a normal person urinates depends upon how much liquid they take in and how much liquid they are losing. If the temperature is hot and there is high humidity, then the person will sweat more and usually breathe a little more frequently. These factors decrease the amount of frequency of urination that would be considered normal. The amount you drink is easily determined, but the amount of fluid lost is sometimes more difficult to calculate.  Fluid is lost in two ways:  Sensible fluid loss is usually measured by the amount of urine that you get rid of. Losses of fluid can also occur with diarrhea.  Insensible fluid loss is more difficult to measure. It is caused by evaporation. Insensible loss of fluid occurs through breathing and sweating. It usually  ranges from a little less than a quart to a little more than a quart of fluid a day. In normal temperatures and activity levels, the average person [name] urinate 4 to 7 times in a 24-hour period. Needing to urinate more often than that could indicate a problem. If one urinates 4 to 7 times in 24 hours and has large volumes each time, that could indicate a different problem from one who urinates 4 to 7 times a day and has small volumes. The time of urinating is also important. Most urinating should be done during the waking hours. Getting up at night to urinate frequently can indicate some problems. CAUSES  The bladder is the organ in your lower abdomen that holds urine. Like a balloon, it swells some as it fills up. Your nerves sense this and tell you it is time to head for the bathroom. There are a number of reasons that you might feel the need to urinate more often than usual. They include:  Urinary tract infection. This is usually associated with other signs such as burning when you urinate.  In men, problems with the prostate (a walnut-size gland that is located near the tube that carries urine out of your body). There are two reasons why the prostate can cause an increased frequency of urination:  An enlarged prostate that does not let the bladder empty well. If the bladder only half empties when you urinate, then it only has half the capacity to fill before you have to urinate again.  The nerves in the bladder become more hypersensitive with an increased size of the prostate even if the bladder empties completely.  Pregnancy.  Obesity. Excess weight is more likely to cause a problem for women than for men.  Bladder stones or other bladder problems.  Caffeine.  Alcohol.  Medications. For example, drugs that help the body get rid of extra fluid (diuretics) increase urine production. Some other medicines must be taken with lots of fluids.  Muscle or nerve weakness. This might be the result  of a spinal cord injury, a stroke, multiple sclerosis, or Parkinson disease.  Long-standing diabetes can decrease the sensation of the bladder. This loss of sensation makes it harder to sense the bladder needs to  be emptied. Over a period of years, the bladder is stretched out by constant overfilling. This weakens the bladder muscles so that the bladder does not empty well and has less capacity to fill with new urine.  Interstitial cystitis (also called painful bladder syndrome). This condition develops because the tissues that line the inside of the bladder are inflamed (inflammation is the body's way of reacting to injury or infection). It causes pain and frequent urination. It occurs in women more often than in men. DIAGNOSIS   To decide what might be causing your urinary frequency, your health care provider will probably:  Ask about symptoms you have noticed.  Ask about your overall health. This will include questions about any medications you are taking.  Do a physical examination.  Order some tests. These might include:  A blood test to check for diabetes or other health issues that could be contributing to the problem.  Urine testing. This could measure the flow of urine and the pressure on the bladder.  A test of your neurological system (the brain, spinal cord, and nerves). This is the system that senses the need to urinate.  A bladder test to check whether it is emptying completely when you urinate.  Cystoscopy. This test uses a thin tube with a tiny camera on it. It offers a look inside your urethra and bladder to see if there are problems.  Imaging tests. You might be given a contrast dye and then asked to urinate. X-rays are taken to see how your bladder is working. TREATMENT  It is important for you to be evaluated to determine if the amount or frequency that you have is unusual or abnormal. If it is found to be abnormal, the cause should be determined and this can usually  be found out easily. Depending upon the cause, treatment could include medication, stimulation of the nerves, or surgery. There are not too many things that you can do as an individual to change your urinary frequency. It is important that you balance the amount of fluid intake needed to compensate for your activity and the temperature. Medical problems will be diagnosed and taken care of by your physician. There is no particular bladder training such as Kegel exercises that you can do to help urinary frequency. This is an exercise that is usually recommended for people who have leaking of urine when they laugh, cough, or sneeze. HOME CARE INSTRUCTIONS   Take any medications your health care provider prescribed or suggested. Follow the directions carefully.  Practice any lifestyle changes that are recommended. These might include:  Drinking less fluid or drinking at different times of the day. If you need to urinate often during the night, for example, you [name] need to stop drinking fluids early in the evening.  Cutting down on caffeine or alcohol. They both can make you need to urinate more often than normal. Caffeine is found in coffee, tea, and sodas.  Losing weight, if that is recommended.  Keep a journal or a log. You might be asked to record how much you drink and when and where you feel the need to urinate. This will also help evaluate how well the treatment provided by your physician is working. SEEK MEDICAL CARE IF:   Your need to urinate often gets worse.  You feel increased pain or irritation when you urinate.  You notice blood in your urine.  You have questions about any medications that your health care provider recommended.  You notice blood,  pus, or swelling at the site of any test or treatment procedure.  You develop a fever of more than 100.41F (38.1C). SEEK IMMEDIATE MEDICAL CARE IF:  You develop a fever of more than 102.44F (38.9C). Document Released: 09/05/2009  Document Revised: 03/26/2014 Document Reviewed: 09/05/2009 [facility] Patient Information 2015 [place], [place]. This information is not intended to replace advice given to you by your health care provider. Make sure you discuss any questions you have with your health care provider.  Pelvic Pain Pelvic pain is pain felt below the belly button and between your hips. It can be caused by many different things. It is important to get help right away. This is especially true for severe, sharp, or unusual pain that comes on suddenly.  HOME CARE  Only take medicine as told by your doctor.  Rest as told by your doctor.  Eat a healthy diet, such as fruits, vegetables, and lean meats.  Drink enough fluids to keep your pee (urine) clear or pale yellow, or as told.  Avoid sex (intercourse) if it causes pain.  Apply warm or cold packs to your lower belly (abdomen). Use the type of pack that helps the pain.  Avoid situations that cause you stress.  Keep a journal to track your pain. Write down:  When the pain started.  Where it is located.  If there are things that seem to be related to the pain, such as food or your period.  Follow up with your doctor as told. GET HELP RIGHT AWAY IF:   You have heavy bleeding from the vagina.  You have more pelvic pain.  You feel lightheaded or pass out (faint).  You have chills.  You have pain when you pee or have blood in your pee.  You cannot stop having watery poop (diarrhea).  You cannot stop throwing up (vomiting).  You have a fever or lasting symptoms for more than 3 days.  You have a fever and your symptoms suddenly get worse.  You are being physically or sexually abused.  Your medicine does not help your pain.  You have fluid (discharge) coming from your vagina that is not normal. MAKE SURE YOU:  Understand these instructions.  Will watch your condition.  Will get help if you are not doing well or get worse. Document Released:  04/27/2008 Document Revised: 05/10/2012 Document Reviewed: 02/29/2012 [facility] Patient Information 2015 [place], [place]. This information is not intended to replace advice given to you by your health care provider. Make sure you discuss any questions you have with your health care provider.

## 2015-03-21 NOTE — ED Provider Notes (Signed)
CSN: [id number]     Arrival date & time 03/21/15  1352 History   First MD Initiated Contact with Patient 03/21/15 1550     Chief Complaint  Patient presents with  . Urinary Tract Infection   (Consider location/radiation/quality/duration/timing/severity/associated sxs/prior Treatment) HPI Comments: 69 year old female who presents with a complaint of a burning to the bilateral upper abdomen as well as in the lower pelvis. She has had the symptoms for approximately 20 half weeks. She was seen at [facility] emergency department 4 days ago and had a rather extensive workup including a pelvic exam as well as blood work, urinalysis. The urinalysis was clear. Blood work was normal. And the results of the pelvic exam and swabs were negative for measured infection. Approximately one or 2 days later the patient went to another type clinic and was given a 3 day course of antibiotics. She states this is the third day and she is not feeling any better. She has no nausea vomiting or diarrhea. She states she has been having bowel movements nearly every day. She denies bleeding.   Past Medical History  Diagnosis Date  . Diabetes mellitus   . Hypertension   . GERD (gastroesophageal reflux disease)   . Herniated disc   . Arthritis   . Shortness of breath on exertion 11/10/11    "sometimes"  . H/O hiatal hernia   . Headache(784.0)   . Hemorrhoids   . Chronic back pain greater than 3 months duration   . Vertigo   . Chronic neck pain   . Hx of echocardiogram 2010    normal EF   Past Surgical History  Procedure Laterality Date  . Knee surgery      right  . Shoulder open rotator cuff repair  07/20/2011    left  . Fracture surgery  2004?    right shoulder  . Knee arthroscopy  2004    right  . Tonsillectomy    . Cholecystectomy  ~ 2008  . Abdominal hysterectomy  1997?    "partial"  . Neck fusion     History reviewed. No pertinent family history. History  Substance Use Topics  . Smoking status:  Current Some Day Smoker -- 0.20 packs/day for 40 years    Types: Cigarettes  . Smokeless tobacco: Never Used     Comment: smoking cessation consult entered  . Alcohol Use: No   OB History    No data available     Review of Systems  Constitutional: Positive for activity change and appetite change. Negative for fever.  HENT: Negative.   Respiratory: Negative.   Cardiovascular: Negative.   Gastrointestinal: Positive for abdominal pain. Negative for nausea, vomiting, diarrhea, constipation, blood in stool and abdominal distention.  Genitourinary: Positive for dysuria, frequency and pelvic pain. Negative for hematuria, vaginal bleeding, vaginal discharge and menstrual problem.  Musculoskeletal: Negative.   Neurological: Negative.   Psychiatric/Behavioral: Negative.     Allergies  Oxycodone  Home Medications   Prior to Admission medications   Medication Sig Start Date End Date Taking? Authorizing Provider  amLODipine (NORVASC) 5 MG tablet Take 5 mg by mouth 2 (two) times daily. Doctor increased to twice daily for 5 days. This is patients 5th day of twice daily therapy   Yes Historical Provider, MD  esomeprazole (NEXIUM) 40 MG capsule Take 40 mg by mouth daily as needed (for heartburn).    Yes Historical Provider, MD  losartan (COZAAR) 100 MG tablet Take 100 mg by mouth daily. 09/20/14  Yes Historical Provider, MD  metFORMIN (GLUCOPHAGE) 500 MG tablet Take 500 mg by mouth 2 (two) times daily. 01/06/14  Yes Historical Provider, MD  metoprolol (LOPRESSOR) 50 MG tablet Take 50 mg by mouth 2 (two) times daily. 09/03/14  Yes Historical Provider, MD  potassium chloride (K-DUR) 10 MEQ tablet Take 20 mEq by mouth daily.   Yes Historical Provider, MD  ALPRAZolam (XANAX) 0.25 MG tablet Take 0.25 mg by mouth 2 (two) times daily as needed for anxiety or sleep.  10/16/14   Historical Provider, MD  cephALEXin (KEFLEX) 500 MG capsule Take 1 capsule (500 mg total) by mouth 4 (four) times daily. 03/21/15    Janne Napoleon, NP  Cranberry-Vitamin C-Probiotic (AZO CRANBERRY PO) Take 2 tablets by mouth 2 (two) times daily as needed (buring).    Historical Provider, MD  Miconazole Nitrate-Wipes (MONISTAT 7 COMPLETE THERAPY) 100-2 MG-% KIT Place 1 application vaginally at bedtime as needed (buring/yeast).    Historical Provider, MD  solifenacin (VESICARE) 5 MG tablet Take 1 tablet (5 mg total) by mouth daily. 03/18/15   Linton Flemings, MD  sucralfate (CARAFATE) 1 G tablet Take 1 tablet (1 g total) by mouth 4 (four) times daily. 03/18/15   Linton Flemings, MD   BP 138/72 mmHg  Pulse 57  Temp(Src) 97.8 F (36.6 C) (Oral)  Resp 16  SpO2 98% Physical Exam  Constitutional: She appears well-developed and well-nourished. No distress.  Eyes: Conjunctivae and EOM are normal.  Neck: Normal range of motion. Neck supple.  Cardiovascular: Normal rate, regular rhythm, normal heart sounds and intact distal pulses.   Pulmonary/Chest: Effort normal and breath sounds normal. No respiratory distress. She has no wheezes. She has no rales.  Abdominal: Soft. Bowel sounds are normal. She exhibits no distension and no mass. There is no rebound and no guarding.  Mild tenderness in the epigastrium. The stinging and burning pain is exacerbated with palpation of the bilateral lower costal margins. There is also tenderness over the lower mid and right suprapubic areas. No tenderness over the right lower quadrant.  Musculoskeletal: She exhibits no edema.  Lymphadenopathy:    She has no cervical adenopathy.  Neurological: She is alert. She exhibits normal muscle tone.  Skin: Skin is warm and dry.  Nursing note and vitals reviewed.   ED Course  Procedures (including critical care time) Labs Review Labs Reviewed  POCT URINALYSIS DIP (DEVICE) - Abnormal; Notable for the following:    Hgb urine dipstick SMALL (*)    All other components within normal limits  URINE CULTURE    Imaging Review No results found.   MDM   1. Dysuria    2. Costochondritis   3. Epigastric abdominal tenderness   4. Urinary frequency   5. Pelvic pain in female     Differential Charlei include interstitial cystitis. The lab work and examination from the ER visit was reviewed. The patient was given a 3 day course of antibiotics 2 and half days ago and she states she is not feeling any better. Even know her urine is essentially clear today we will prescribe a course of Keflex over 7 days. Also recommend that she continue taking her Vesicare indicates that she Yasmyn be having bladder spasms as well.  The burning pain for which she described in the bilateral aspects of the abdomen is likely due to costochondritis that she is exquisitely tender over the lower costal margins/ribs. Continue the Carafate as previously prescribed for epigastric tenderness. I doubt there is any additional  test or exam that we can do for her today. Although she has discomfort is a continuation from at least 2-1/2 weeks. No acute symptoms today. No new symptoms. You will need to see a urologist. Call your doctor for an appointment as soon as possible and if you are able to see him tomorrow as you suggested do that. He will need additional of a whitish in that he do not have here. For any worsening new symptoms or problems Champayne return or go to the emergency department.   Janne Napoleon, NP 03/21/15 1631  Janne Napoleon, NP 03/21/15 [phone]

## 2015-03-23 LAB — URINE CULTURE
CULTURE: NO GROWTH
Colony Count: NO GROWTH

## 2015-06-05 ENCOUNTER — Encounter: Payer: Medicare HMO | Admitting: Obstetrics and Gynecology

## 2015-08-14 ENCOUNTER — Other Ambulatory Visit: Payer: Self-pay | Admitting: Cardiovascular Disease

## 2015-08-14 ENCOUNTER — Ambulatory Visit
Admission: RE | Admit: 2015-08-14 | Discharge: 2015-08-14 | Disposition: A | Discharge status: Home / Self Care | Payer: Medicare HMO | Source: Ambulatory Visit | Attending: Cardiovascular Disease | Admitting: Cardiovascular Disease

## 2015-08-14 DIAGNOSIS — M79605 Pain in left leg: Secondary | ICD-10-CM

## 2015-08-14 DIAGNOSIS — M7989 Other specified soft tissue disorders: Secondary | ICD-10-CM

## 2015-08-18 ENCOUNTER — Emergency Department (HOSPITAL_COMMUNITY)
Admission: EM | Admit: 2015-08-18 | Discharge: 2015-08-18 | Disposition: A | Discharge status: Home / Self Care | Payer: Medicare HMO | Attending: Emergency Medicine | Admitting: Emergency Medicine

## 2015-08-18 ENCOUNTER — Encounter (HOSPITAL_COMMUNITY): Payer: Self-pay | Admitting: *Deleted

## 2015-08-18 DIAGNOSIS — R51 Headache: Secondary | ICD-10-CM | POA: Diagnosis not present

## 2015-08-18 DIAGNOSIS — M1990 Unspecified osteoarthritis, unspecified site: Secondary | ICD-10-CM | POA: Diagnosis not present

## 2015-08-18 DIAGNOSIS — Z720 Tobacco use: Secondary | ICD-10-CM | POA: Insufficient documentation

## 2015-08-18 DIAGNOSIS — G8929 Other chronic pain: Secondary | ICD-10-CM | POA: Insufficient documentation

## 2015-08-18 DIAGNOSIS — E119 Type 2 diabetes mellitus without complications: Secondary | ICD-10-CM | POA: Diagnosis not present

## 2015-08-18 DIAGNOSIS — Z79899 Other long term (current) drug therapy: Secondary | ICD-10-CM | POA: Insufficient documentation

## 2015-08-18 DIAGNOSIS — R42 Dizziness and giddiness: Secondary | ICD-10-CM | POA: Diagnosis not present

## 2015-08-18 DIAGNOSIS — I10 Essential (primary) hypertension: Secondary | ICD-10-CM | POA: Diagnosis not present

## 2015-08-18 DIAGNOSIS — K219 Gastro-esophageal reflux disease without esophagitis: Secondary | ICD-10-CM | POA: Insufficient documentation

## 2015-08-18 LAB — I-STAT CHEM 8, ED
BUN: 12 mg/dL (ref 6–20)
CALCIUM ION: 1.14 mmol/L (ref 1.13–1.30)
Chloride: 102 mmol/L (ref 101–111)
Creatinine, Ser: 1.1 mg/dL — ABNORMAL HIGH (ref 0.44–1.00)
Glucose, Bld: 132 mg/dL — ABNORMAL HIGH (ref 65–99)
HCT: 44 % (ref 36.0–46.0)
HEMOGLOBIN: 15 g/dL (ref 12.0–15.0)
Potassium: 3.9 mmol/L (ref 3.5–5.1)
Sodium: 139 mmol/L (ref 135–145)
TCO2: 24 mmol/L (ref 0–100)

## 2015-08-18 LAB — URINALYSIS, ROUTINE W REFLEX MICROSCOPIC
BILIRUBIN URINE: NEGATIVE
GLUCOSE, UA: NEGATIVE mg/dL
KETONES UR: NEGATIVE mg/dL
Leukocytes, UA: NEGATIVE
Nitrite: NEGATIVE
PROTEIN: 30 mg/dL — AB
Specific Gravity, Urine: 1.015 (ref 1.005–1.030)
Urobilinogen, UA: 0.2 mg/dL (ref 0.0–1.0)
pH: 6 (ref 5.0–8.0)

## 2015-08-18 LAB — URINE MICROSCOPIC-ADD ON

## 2015-08-18 MED ORDER — IBUPROFEN 800 MG PO TABS
800.0000 mg | ORAL_TABLET | Freq: Once | ORAL | Status: AC
  Administered 2015-08-18: 800 mg via ORAL
  Filled 2015-08-18: qty 1

## 2015-08-18 NOTE — ED Notes (Signed)
Pt in from home c/o dizziness, pt reports seeing her PCP x 2 days for similar symptoms, pt told she had HTN & was started on Clonidine 0.1 mg BID in addition to the regular BP meds she takes Metoprolol, Losartan, & Amlodipine, pt c/o HA 7/10 onset Friday, A&O x4, denies n/v/d

## 2015-08-18 NOTE — ED Provider Notes (Signed)
CSN: [id number]     Arrival date & time 08/18/15  3664 History   First MD Initiated Contact with Patient 08/18/15 [phone]     Chief Complaint  Patient presents with  . Dizziness     (Consider location/radiation/quality/duration/timing/severity/associated sxs/prior Treatment) Patient is a 69 y.o. female presenting with dizziness.  Dizziness Quality:  Lightheadedness Severity:  Mild Duration:  2 days Timing:  Intermittent Progression:  Waxing and waning Chronicity:  Recurrent Relieved by:  None tried Worsened by:  Nothing Ineffective treatments:  None tried Associated symptoms: headaches   Associated symptoms: no blood in stool, no chest pain, no nausea, no shortness of breath and no vision changes     Past Medical History  Diagnosis Date  . Diabetes mellitus   . Hypertension   . GERD (gastroesophageal reflux disease)   . Herniated disc   . Arthritis   . Shortness of breath on exertion 11/10/11    "sometimes"  . H/O hiatal hernia   . Headache(784.0)   . Hemorrhoids   . Chronic back pain greater than 3 months duration   . Vertigo   . Chronic neck pain   . Hx of echocardiogram 2010    normal EF   Past Surgical History  Procedure Laterality Date  . Knee surgery      right  . Shoulder open rotator cuff repair  07/20/2011    left  . Fracture surgery  2004?    right shoulder  . Knee arthroscopy  2004    right  . Tonsillectomy    . Cholecystectomy  ~ 2008  . Abdominal hysterectomy  1997?    "partial"  . Neck fusion     No family history on file. Social History  Substance Use Topics  . Smoking status: Current Some Day Smoker -- 0.20 packs/day for 40 years    Types: Cigarettes  . Smokeless tobacco: Never Used     Comment: smoking cessation consult entered  . Alcohol Use: No   OB History    No data available     Review of Systems  Constitutional: Negative for fever and chills.  HENT: Negative for congestion and drooling.   Eyes: Negative for visual  disturbance.  Respiratory: Negative for shortness of breath.   Cardiovascular: Negative for chest pain.  Gastrointestinal: Negative for nausea and blood in stool.  Musculoskeletal: Negative for back pain and neck pain.  Skin: Negative for color change.  Neurological: Positive for dizziness, light-headedness and headaches. Negative for seizures, syncope and speech difficulty.  All other systems reviewed and are negative.     Allergies  Oxycodone  Home Medications   Prior to Admission medications   Medication Sig Start Date End Date Taking? Authorizing Provider  ALPRAZolam (XANAX) 0.25 MG tablet Take 0.25 mg by mouth 2 (two) times daily as needed for anxiety or sleep.  10/16/14  Yes Historical Provider, MD  amLODipine (NORVASC) 5 MG tablet Take 5 mg by mouth 2 (two) times daily.    Yes Historical Provider, MD  cloNIDine (CATAPRES) 0.1 MG tablet Take 0.1 mg by mouth 2 (two) times daily. 08/16/15  Yes Historical Provider, MD  esomeprazole (NEXIUM) 40 MG capsule Take 40 mg by mouth daily as needed (for heartburn).    Yes Historical Provider, MD  losartan (COZAAR) 100 MG tablet Take 100 mg by mouth daily. 09/20/14  Yes Historical Provider, MD  metFORMIN (GLUCOPHAGE) 500 MG tablet Take 500 mg by mouth 2 (two) times daily. 01/06/14  Yes Historical Provider, MD  metoprolol (LOPRESSOR) 50 MG tablet Take 50 mg by mouth 2 (two) times daily. 09/03/14  Yes Historical Provider, MD  potassium chloride (K-DUR) 10 MEQ tablet Take 20 mEq by mouth daily.   Yes Historical Provider, MD   BP 123/50 mmHg  Pulse 58  Temp(Src) 98.4 F (36.9 C) (Oral)  Resp 18  Ht '5\' 7"'$  (1.702 m)  Wt 217 lb (98.431 kg)  BMI 33.98 kg/m2  SpO2 99% Physical Exam  Constitutional: She is oriented to person, place, and time. She appears well-developed and well-nourished.  HENT:  Head: Normocephalic and atraumatic.  Eyes: Conjunctivae and EOM are normal. Right eye exhibits no discharge. Left eye exhibits no discharge.   Cardiovascular: Normal rate and regular rhythm.   Pulmonary/Chest: Effort normal and breath sounds normal. No respiratory distress.  Abdominal: Soft. She exhibits no distension. There is no tenderness. There is no rebound.  Musculoskeletal: Normal range of motion. She exhibits no edema or tenderness.  Neurological: She is alert and oriented to person, place, and time.  No altered mental status, able to give full seemingly accurate history.  Face is symmetric, EOM's intact, pupils equal and reactive, vision intact, tongue and uvula midline without deviation Upper and Lower extremity motor 5/5, intact pain perception in distal extremities, 2+ reflexes in biceps, patella and achilles tendons.   Skin: Skin is warm and dry.  Nursing note and vitals reviewed.   ED Course  Procedures (including critical care time) Labs Review Labs Reviewed  URINALYSIS, ROUTINE W REFLEX MICROSCOPIC (NOT AT [facility]) - Abnormal; Notable for the following:    APPearance CLOUDY (*)    Hgb urine dipstick TRACE (*)    Protein, ur 30 (*)    All other components within normal limits  URINE MICROSCOPIC-ADD ON - Abnormal; Notable for the following:    Squamous Epithelial / LPF MANY (*)    All other components within normal limits  I-STAT CHEM 8, ED - Abnormal; Notable for the following:    Creatinine, Ser 1.10 (*)    Glucose, Bld 132 (*)    All other components within normal limits    Imaging Review No results found. I have personally reviewed and evaluated these images and lab results as part of my medical decision-making.   EKG Interpretation   Date/Time:  Sunday August 18 2015 09:40:31 EDT Ventricular Rate:  55 PR Interval:  179 QRS Duration: 86 QT Interval:  450 QTC Calculation: 430 R Axis:   -10 Text Interpretation:  Sinus rhythm Consider left atrial enlargement No  significant change since last tracing Confirmed by [facility] MD, Corene Cornea  [phone]) on 08/18/2015 9:46:22 AM      MDM   Final diagnoses:   Dizziness   69 year old female with intermittent elevations and decrease in blood pressure. She was started on clonidine recently and has had low blood pressures low as 110/50 and has dizziness at that time. On evaluation here she is asymptomatic is able ambulate without difficulty. Neuro exam is normal no evidence of ataxia. Feel like her symptoms are likely related to over correction of her blood pressure too quickly. We'll advise holding clonidine until she sees her primary doctor tomorrow.  I have personally and contemperaneously reviewed labs and imaging and used in my decision making as above.   A medical screening exam was performed and I feel the patient has had an appropriate workup for their chief complaint at this time and likelihood of emergent condition existing is low. They have been counseled on decision, discharge,  follow up and which symptoms necessitate immediate return to the emergency department. They or their family verbally stated understanding and agreement with plan and discharged in stable condition.      Merrily Pew, MD 08/18/15 [phone]

## 2015-08-18 NOTE — Discharge Instructions (Signed)
I would hold your clonidine until you can follow up with your primary provider as your BP is significantly lower with it and Louetta be related to intermittent dizziness.

## 2015-08-31 ENCOUNTER — Encounter (HOSPITAL_COMMUNITY): Payer: Self-pay | Admitting: Emergency Medicine

## 2015-08-31 ENCOUNTER — Emergency Department (HOSPITAL_COMMUNITY)
Admission: EM | Admit: 2015-08-31 | Discharge: 2015-08-31 | Disposition: A | Discharge status: Home / Self Care | Payer: Medicare HMO | Attending: Emergency Medicine | Admitting: Emergency Medicine

## 2015-08-31 DIAGNOSIS — K219 Gastro-esophageal reflux disease without esophagitis: Secondary | ICD-10-CM | POA: Diagnosis not present

## 2015-08-31 DIAGNOSIS — I10 Essential (primary) hypertension: Secondary | ICD-10-CM | POA: Diagnosis not present

## 2015-08-31 DIAGNOSIS — M1990 Unspecified osteoarthritis, unspecified site: Secondary | ICD-10-CM | POA: Insufficient documentation

## 2015-08-31 DIAGNOSIS — Z720 Tobacco use: Secondary | ICD-10-CM | POA: Diagnosis not present

## 2015-08-31 DIAGNOSIS — Z79899 Other long term (current) drug therapy: Secondary | ICD-10-CM | POA: Insufficient documentation

## 2015-08-31 DIAGNOSIS — E119 Type 2 diabetes mellitus without complications: Secondary | ICD-10-CM | POA: Diagnosis not present

## 2015-08-31 DIAGNOSIS — G8929 Other chronic pain: Secondary | ICD-10-CM | POA: Insufficient documentation

## 2015-08-31 DIAGNOSIS — R519 Headache, unspecified: Secondary | ICD-10-CM

## 2015-08-31 DIAGNOSIS — R51 Headache: Secondary | ICD-10-CM | POA: Diagnosis present

## 2015-08-31 MED ORDER — DIPHENHYDRAMINE HCL 50 MG/ML IJ SOLN
25.0000 mg | Freq: Once | INTRAMUSCULAR | Status: AC
  Administered 2015-08-31: 25 mg via INTRAVENOUS
  Filled 2015-08-31: qty 1

## 2015-08-31 MED ORDER — SODIUM CHLORIDE 0.9 % IV BOLUS (SEPSIS): 1000.0000 mL | Freq: Once | INTRAVENOUS | Status: DC

## 2015-08-31 MED ORDER — METOCLOPRAMIDE HCL 5 MG/ML IJ SOLN
10.0000 mg | Freq: Once | INTRAMUSCULAR | Status: AC
  Administered 2015-08-31: 10 mg via INTRAVENOUS
  Filled 2015-08-31: qty 2

## 2015-08-31 MED ORDER — KETOROLAC TROMETHAMINE 30 MG/ML IJ SOLN
15.0000 mg | Freq: Once | INTRAMUSCULAR | Status: AC
  Administered 2015-08-31: 15 mg via INTRAVENOUS
  Filled 2015-08-31: qty 1

## 2015-08-31 MED ORDER — KETOROLAC TROMETHAMINE 30 MG/ML IJ SOLN: 30.0000 mg | Freq: Once | INTRAMUSCULAR | Status: DC

## 2015-08-31 MED ORDER — SODIUM CHLORIDE 0.9 % IV BOLUS (SEPSIS)
1000.0000 mL | Freq: Once | INTRAVENOUS | Status: AC
  Administered 2015-08-31: 1000 mL via INTRAVENOUS

## 2015-08-31 MED ORDER — METOCLOPRAMIDE HCL 5 MG/ML IJ SOLN: 10.0000 mg | Freq: Once | INTRAMUSCULAR | Status: DC

## 2015-08-31 MED ORDER — DIPHENHYDRAMINE HCL 50 MG/ML IJ SOLN: 25.0000 mg | Freq: Once | INTRAMUSCULAR | Status: DC

## 2015-08-31 NOTE — ED Provider Notes (Signed)
Medical screening examination/treatment/procedure(s) were conducted as a shared visit with non-physician practitioner(s) and myself.  I personally evaluated the patient during the encounter.  HTN and HA. Still with headache when BP normal, doubt intracranial abnormalities. Neuro exam normal, no other e/o significant end organ damage. Will tx headache and discharge.    EKG Interpretation None        Merrily Pew, MD 08/31/15 [phone]

## 2015-08-31 NOTE — ED Provider Notes (Signed)
CSN: [id number]     Arrival date & time 08/31/15  1002 History   First MD Initiated Contact with Patient 08/31/15 1010     No chief complaint on file.    (Consider location/radiation/quality/duration/timing/severity/associated sxs/prior Treatment) HPI Comments: [name] is a 69 y.o. female with a PMHx of DM2, HTN, GERD, headaches, chronic back pain, vertigo, and hemorrhoids, who presents to the ED with complaints of hypertension and headache. She reports that she ran out of her metoprolol yesterday, woke up with a headache she felt was due to her blood pressure being high, took clonidine 0.1 mg with relief of her headache, but this morning she again awoke with a headache and went to the pharmacy to fill her metoprolol where she took her blood pressure and it was 215/89 which prompted her to come to the emergency room. She reports her headache is similar to prior headaches when she has hypertension, describes it as a 9/10 intermittent generalized nonradiating throbbing headache worse with movement with no treatments tried prior to arrival. This morning all she took for her blood pressure was losartan and Norvasc, she has yet to pick up her metoprolol, and she was told to stop taking clonidine by her regular doctor she was having too many low blood pressures. Associated symptoms include slight feeling of blurred vision.   She denies any fevers, chills, neck stiffness, chest pain, shortness breath, abdominal pain, nausea, vomiting, diarrhea constipation dysuria, hematuria, numbness, tingling, weakness, syncope, or lightheadedness. PCP is Dr. Doylene Canard  Patient is a 69 y.o. female presenting with hypertension. The history is provided by the patient. No language interpreter was used.  Hypertension This is a chronic problem. The current episode started yesterday. The problem occurs daily. The problem has been unchanged. Associated symptoms include headaches and a visual change (somewhat blurry). Pertinent  negatives include no abdominal pain, arthralgias, chest pain, chills, fever, myalgias, nausea, numbness, urinary symptoms, vertigo, vomiting or weakness. Nothing aggravates the symptoms. She has tried nothing for the symptoms. The treatment provided no relief.    Past Medical History  Diagnosis Date  . Diabetes mellitus   . Hypertension   . GERD (gastroesophageal reflux disease)   . Herniated disc   . Arthritis   . Shortness of breath on exertion 11/10/11    "sometimes"  . H/O hiatal hernia   . Headache(784.0)   . Hemorrhoids   . Chronic back pain greater than 3 months duration   . Vertigo   . Chronic neck pain   . Hx of echocardiogram 2010    normal EF   Past Surgical History  Procedure Laterality Date  . Knee surgery      right  . Shoulder open rotator cuff repair  07/20/2011    left  . Fracture surgery  2004?    right shoulder  . Knee arthroscopy  2004    right  . Tonsillectomy    . Cholecystectomy  ~ 2008  . Abdominal hysterectomy  1997?    "partial"  . Neck fusion     No family history on file. Social History  Substance Use Topics  . Smoking status: Current Some Day Smoker -- 0.20 packs/day for 40 years    Types: Cigarettes  . Smokeless tobacco: Never Used     Comment: smoking cessation consult entered  . Alcohol Use: No   OB History    No data available     Review of Systems  Constitutional: Negative for fever and chills.  Eyes: Positive  for visual disturbance (states it feels somewhat blurry). Negative for redness.  Respiratory: Negative for shortness of breath.   Cardiovascular: Negative for chest pain.  Gastrointestinal: Negative for nausea, vomiting, abdominal pain, diarrhea and constipation.  Genitourinary: Negative for dysuria and hematuria.  Musculoskeletal: Negative for myalgias, arthralgias and neck stiffness.  Skin: Negative for color change.  Allergic/Immunologic: Negative for immunocompromised state.  Neurological: Positive for headaches.  Negative for dizziness, vertigo, weakness, light-headedness and numbness.  Psychiatric/Behavioral: Negative for confusion.   10 Systems reviewed and are negative for acute change except as noted in the HPI.    Allergies  Oxycodone  Home Medications   Prior to Admission medications   Medication Sig Start Date End Date Taking? Authorizing Provider  ALPRAZolam (XANAX) 0.25 MG tablet Take 0.25 mg by mouth 2 (two) times daily as needed for anxiety or sleep.  10/16/14   Historical Provider, MD  amLODipine (NORVASC) 5 MG tablet Take 5 mg by mouth 2 (two) times daily.     Historical Provider, MD  cloNIDine (CATAPRES) 0.1 MG tablet Take 0.1 mg by mouth 2 (two) times daily. 08/16/15   Historical Provider, MD  esomeprazole (NEXIUM) 40 MG capsule Take 40 mg by mouth daily as needed (for heartburn).     Historical Provider, MD  losartan (COZAAR) 100 MG tablet Take 100 mg by mouth daily. 09/20/14   Historical Provider, MD  metFORMIN (GLUCOPHAGE) 500 MG tablet Take 500 mg by mouth 2 (two) times daily. 01/06/14   Historical Provider, MD  metoprolol (LOPRESSOR) 50 MG tablet Take 50 mg by mouth 2 (two) times daily. 09/03/14   Historical Provider, MD  potassium chloride (K-DUR) 10 MEQ tablet Take 20 mEq by mouth daily.    Historical Provider, MD   BP 151/59 mmHg  Pulse 72  Temp(Src) 98.5 F (36.9 C) (Oral)  Resp 15  Ht '5\' 7"'$  (1.702 m)  Wt 214 lb (97.07 kg)  BMI 33.51 kg/m2  SpO2 100% Physical Exam  Constitutional: She is oriented to person, place, and time. Vital signs are normal. She appears well-developed and well-nourished.  Non-toxic appearance. No distress.  Afebrile, nontoxic, NAD  HENT:  Head: Normocephalic and atraumatic.  Mouth/Throat: Oropharynx is clear and moist and mucous membranes are normal.  Eyes: Conjunctivae and EOM are normal. Pupils are equal, round, and reactive to light. Right eye exhibits no discharge. Left eye exhibits no discharge.  PERRL, EOMI, no nystagmus, no visual field  deficits   Neck: Normal range of motion. Neck supple.  No meningismus  Cardiovascular: Normal rate, regular rhythm, normal heart sounds and intact distal pulses.  Exam reveals no gallop and no friction rub.   No murmur heard. Pulmonary/Chest: Effort normal and breath sounds normal. No respiratory distress. She has no decreased breath sounds. She has no wheezes. She has no rhonchi. She has no rales.  Abdominal: Soft. Normal appearance and bowel sounds are normal. She exhibits no distension. There is no tenderness. There is no rigidity, no rebound and no guarding.  Musculoskeletal: Normal range of motion.  MAE x4 Strength and sensation grossly intact Distal pulses intact No pedal edema Gait steady  Neurological: She is alert and oriented to person, place, and time. She has normal strength. No cranial nerve deficit or sensory deficit. Gait normal. GCS eye subscore is 4. GCS verbal subscore is 5. GCS motor subscore is 6.  CN 2-12 grossly intact A&O x4 GCS 15 Sensation and strength intact Gait nonataxic Coordination with finger-to-nose WNL Neg pronator drift  Skin: Skin is warm, dry and intact. No rash noted.  Psychiatric: She has a normal mood and affect.  Nursing note and vitals reviewed.   ED Course  Procedures (including critical care time) Labs Review Labs Reviewed - No data to display  Imaging Review No results found. I have personally reviewed and evaluated these images and lab results as part of my medical decision-making.   EKG Interpretation None      MDM   Final diagnoses:  Chronic nonintractable headache, unspecified headache type  HTN (hypertension), benign    69 y.o. female here with HA intermittently x2 days, states she took clonidine yesterday with improvement of her headache, today went to fill her metoprolol and at pharmacy it was 215/89 which prompted her to come here. Took 2 of her BP meds, but didn't take metoprolol yet. Stopped clonidine because this  was causing her to have too many low BPs, and dr took off it. Today her HA is similar to her headaches with HTN, no focal neuro deficits. Not yet triaged upon my eval, no vitals done, but on my manual check BP was 168/60. Will get full set of vitals and then reassess and decide on a plan.   10:53 AM BP 151/59, will proceed with migraine cocktail.  12:49 PM HA completely resolved, BP improved. Likely chronic headache from fluctuations in her BP. Discussed monitoring her morning and nighttime BPs, and documenting what time she takes meds, keep a log for her next PCP appt that way they can adjust timing of doses to avoid waking up with HTN and headaches. Also discussed DASH eating plan and tylenol/motrin for headaches. F/up with PCP in 1wk. I explained the diagnosis and have given explicit precautions to return to the ER including for any other new or worsening symptoms. The patient understands and accepts the medical plan as it's been dictated and I have answered their questions. Discharge instructions concerning home care and prescriptions have been given. The patient is STABLE and is discharged to home in good condition.  BP 128/64 mmHg  Pulse 67  Temp(Src) 98.5 F (36.9 C) (Oral)  Resp 15  Ht '5\' 7"'$  (1.702 m)  Wt 214 lb (97.07 kg)  BMI 33.51 kg/m2  SpO2 99%  Meds ordered this encounter  Medications  . ketorolac (TORADOL) 30 MG/ML injection 15 mg    Sig:    And  . metoCLOPramide (REGLAN) injection 10 mg    Sig:    And  . diphenhydrAMINE (BENADRYL) injection 25 mg    Sig:    And  . sodium chloride 0.9 % bolus 1,000 mL    Sig:       Consandra Laske Camprubi-Soms, PA-C 08/31/15 1250  Merrily Pew, MD 08/31/15 1810

## 2015-08-31 NOTE — ED Notes (Signed)
Pt comes from home with c/o headache due to her blood pressure being up. Pt ran out of Metoprolol. Pt A&OX4, NAD noted. Pt denies shortness of breath.

## 2015-08-31 NOTE — Discharge Instructions (Signed)
Use tylenol or motrin as needed for your headaches. Make sure you take your blood pressure medications as directed, document your morning and night-time blood pressures in a log and document what time you take your medications at, and bring it to your next doctor visit to assess if any changes need to be made. Follow up with your regular doctor in 1 week for recheck. Eat a low salt diet as outlined below. Return to the ER for changes or worsening symptoms.   Hypertension Hypertension is another name for high blood pressure. High blood pressure forces your heart to work harder to pump blood. A blood pressure reading has two numbers, which includes a higher number over a lower number (example: 110/72). HOME CARE   Have your blood pressure rechecked by your doctor.  Only take medicine as told by your doctor. Follow the directions carefully. The medicine does not work as well if you skip doses. Skipping doses also puts you at risk for problems.  Do not smoke.  Monitor your blood pressure at home as told by your doctor. GET HELP IF:  You think you are having a reaction to the medicine you are taking.  You have repeat headaches or feel dizzy.  You have puffiness (swelling) in your ankles.  You have trouble with your vision. GET HELP RIGHT AWAY IF:   You get a very bad headache and are confused.  You feel weak, numb, or faint.  You get chest or belly (abdominal) pain.  You throw up (vomit).  You cannot breathe very well. MAKE SURE YOU:   Understand these instructions.  Will watch your condition.  Will get help right away if you are not doing well or get worse.   This information is not intended to replace advice given to you by your health care provider. Make sure you discuss any questions you have with your health care provider.   Document Released: 04/27/2008 Document Revised: 11/14/2013 Document Reviewed: 09/01/2013 Elsevier Interactive Patient Education 2016 [employer].  How to Take Your Blood Pressure HOW DO I GET A BLOOD PRESSURE MACHINE?  You can buy an electronic home blood pressure machine at your local pharmacy. Insurance will sometimes cover the cost if you have a prescription.  Ask your doctor what type of machine is best for you. There are different machines for your arm and your wrist.  If you decide to buy a machine to check your blood pressure on your arm, first check the size of your arm so you can buy the right size cuff. To check the size of your arm:   Use a measuring tape that shows both inches and centimeters.   Wrap the measuring tape around the upper-middle part of your arm. You Ernestina need someone to help you measure.   Write down your arm measurement in both inches and centimeters.   To measure your blood pressure correctly, it is important to have the right size cuff.   If your arm is up to 13 inches (up to 34 centimeters), get an adult cuff size.  If your arm is 13 to 17 inches (35 to 44 centimeters), get a large adult cuff size.    If your arm is 17 to 20 inches (45 to 52 centimeters), get an adult thigh cuff.  WHAT DO THE NUMBERS MEAN?   There are two numbers that make up your blood pressure. For example: 120/80.  The first number (120 in our example) is called the "systolic pressure." It  is a measure of the pressure in your blood vessels when your heart is pumping blood.  The second number (80 in our example) is called the "diastolic pressure." It is a measure of the pressure in your blood vessels when your heart is resting between beats.  Your doctor will tell you what your blood pressure should be. WHAT SHOULD I DO BEFORE I CHECK MY BLOOD PRESSURE?   Try to rest or relax for at least 30 minutes before you check your blood pressure.  Do not smoke.  Do not have any drinks with caffeine, such as:  Soda.  Coffee.  Tea.  Check your blood pressure in a quiet room.  Sit down and stretch out your arm on  a table. Keep your arm at about the level of your heart. Let your arm relax.  Make sure that your legs are not crossed. HOW DO I CHECK MY BLOOD PRESSURE?  Follow the directions that came with your machine.  Make sure you remove any tight-fitting clothing from your arm or wrist. Wrap the cuff around your upper arm or wrist. You should be able to fit a finger between the cuff and your arm. If you cannot fit a finger between the cuff and your arm, it is too tight and should be removed and rewrapped.  Some units require you to manually pump up the arm cuff.  Automatic units inflate the cuff when you press a button.  Cuff deflation is automatic in both models.  After the cuff is inflated, the unit measures your blood pressure and pulse. The readings are shown on a monitor. Hold still and breathe normally while the cuff is inflated.  Getting a reading takes less than a minute.  Some models store readings in a memory. Some provide a printout of readings. If your machine does not store your readings, keep a written record.  Take readings with you to your next visit with your doctor.   This information is not intended to replace advice given to you by your health care provider. Make sure you discuss any questions you have with your health care provider.   Document Released: 10/22/2008 Document Revised: 11/30/2014 Document Reviewed: 01/04/2014 Elsevier Interactive Patient Education 2016 [place] DASH stands for "Dietary Approaches to Stop Hypertension." The DASH eating plan is a healthy eating plan that has been shown to reduce high blood pressure (hypertension). Additional health benefits Karris include reducing the risk of type 2 diabetes mellitus, heart disease, and stroke. The DASH eating plan Evarose also help with weight loss. WHAT DO I NEED TO KNOW ABOUT THE DASH EATING PLAN? For the DASH eating plan, you will follow these general guidelines:  Choose foods with a  percent daily value for sodium of less than 5% (as listed on the food label).  Use salt-free seasonings or herbs instead of table salt or sea salt.  Check with your health care provider or pharmacist before using salt substitutes.  Eat lower-sodium products, often labeled as "lower sodium" or "no salt added."  Eat fresh foods.  Eat more vegetables, fruits, and low-fat dairy products.  Choose whole grains. Look for the word "whole" as the first word in the ingredient list.  Choose fish and skinless chicken or [place] more often than red meat. Limit fish, poultry, and meat to 6 oz (170 g) each day.  Limit sweets, desserts, sugars, and sugary drinks.  Choose heart-healthy fats.  Limit cheese to 1 oz (28 g) per day.  Eat more home-cooked food and less restaurant, buffet, and fast food.  Limit fried foods.  Cook foods using methods other than frying.  Limit canned vegetables. If you do use them, rinse them well to decrease the sodium.  When eating at a restaurant, ask that your food be prepared with less salt, or no salt if possible. WHAT FOODS CAN I EAT? Seek help from a dietitian for individual calorie needs. Grains Whole grain or whole wheat bread. Brown rice. Whole grain or whole wheat pasta. Quinoa, bulgur, and whole grain cereals. Low-sodium cereals. Corn or whole wheat flour tortillas. Whole grain cornbread. Whole grain crackers. Low-sodium crackers. Vegetables Fresh or frozen vegetables (raw, steamed, roasted, or grilled). Low-sodium or reduced-sodium tomato and vegetable juices. Low-sodium or reduced-sodium tomato sauce and paste. Low-sodium or reduced-sodium canned vegetables.  Fruits All fresh, canned (in natural juice), or frozen fruits. Meat and Other Protein Products Ground beef (85% or leaner), grass-fed beef, or beef trimmed of fat. Skinless chicken or [place]. Ground chicken or [place]. Pork trimmed of fat. All fish and seafood. Eggs. Dried beans, peas, or lentils.  Unsalted nuts and seeds. Unsalted canned beans. Dairy Low-fat dairy products, such as skim or 1% milk, 2% or reduced-fat cheeses, low-fat ricotta or cottage cheese, or plain low-fat yogurt. Low-sodium or reduced-sodium cheeses. Fats and Oils Tub margarines without trans fats. Light or reduced-fat mayonnaise and salad dressings (reduced sodium). Avocado. Safflower, olive, or canola oils. Natural peanut or almond butter. Other Unsalted popcorn and pretzels. The items listed above Milessa not be a complete list of recommended foods or beverages. Contact your dietitian for more options. WHAT FOODS ARE NOT RECOMMENDED? Grains White bread. White pasta. White rice. Refined cornbread. Bagels and croissants. Crackers that contain trans fat. Vegetables Creamed or fried vegetables. Vegetables in a cheese sauce. Regular canned vegetables. Regular canned tomato sauce and paste. Regular tomato and vegetable juices. Fruits Dried fruits. Canned fruit in light or heavy syrup. Fruit juice. Meat and Other Protein Products Fatty cuts of meat. Ribs, chicken wings, bacon, sausage, bologna, salami, chitterlings, fatback, hot dogs, bratwurst, and packaged luncheon meats. Salted nuts and seeds. Canned beans with salt. Dairy Whole or 2% milk, cream, half-and-half, and cream cheese. Whole-fat or sweetened yogurt. Full-fat cheeses or blue cheese. Nondairy creamers and whipped toppings. Processed cheese, cheese spreads, or cheese curds. Condiments Onion and garlic salt, seasoned salt, table salt, and sea salt. Canned and packaged gravies. Worcestershire sauce. Tartar sauce. Barbecue sauce. Teriyaki sauce. Soy sauce, including reduced sodium. Steak sauce. Fish sauce. Oyster sauce. Cocktail sauce. Horseradish. Ketchup and mustard. Meat flavorings and tenderizers. Bouillon cubes. Hot sauce. Tabasco sauce. Marinades. Taco seasonings. Relishes. Fats and Oils Butter, stick margarine, lard, shortening, ghee, and bacon fat. Coconut,  palm kernel, or palm oils. Regular salad dressings. Other Pickles and olives. Salted popcorn and pretzels. The items listed above Ling not be a complete list of foods and beverages to avoid. Contact your dietitian for more information. WHERE CAN I FIND MORE INFORMATION? National Heart, Lung, and Blood Institute: travelstabloid.com   This information is not intended to replace advice given to you by your health care provider. Make sure you discuss any questions you have with your health care provider.   Document Released: 10/29/2011 Document Revised: 11/30/2014 Document Reviewed: 09/13/2013 Elsevier Interactive Patient Education 2016 [employer].  Migraine Headache A migraine headache is an intense, throbbing pain on one or both sides of your head. A migraine can last for 30 minutes to several hours. CAUSES  The exact cause of a migraine headache is not always known. However, a migraine Dela be caused when nerves in the brain become irritated and release chemicals that cause inflammation. This causes pain. Certain things Raha also trigger migraines, such as:  Alcohol.  Smoking.  Stress.  Menstruation.  Aged cheeses.  Foods or drinks that contain nitrates, glutamate, aspartame, or tyramine.  Lack of sleep.  Chocolate.  Caffeine.  Hunger.  Physical exertion.  Fatigue.  Medicines used to treat chest pain (nitroglycerine), birth control pills, estrogen, and some blood pressure medicines. SIGNS AND SYMPTOMS  Pain on one or both sides of your head.  Pulsating or throbbing pain.  Severe pain that prevents daily activities.  Pain that is aggravated by any physical activity.  Nausea, vomiting, or both.  Dizziness.  Pain with exposure to bright lights, loud noises, or activity.  General sensitivity to bright lights, loud noises, or smells. Before you get a migraine, you Kenslei get warning signs that a migraine is coming (aura). An aura Gracelyn  include:  Seeing flashing lights.  Seeing bright spots, halos, or zigzag lines.  Having tunnel vision or blurred vision.  Having feelings of numbness or tingling.  Having trouble talking.  Having muscle weakness. DIAGNOSIS  A migraine headache is often diagnosed based on:  Symptoms.  Physical exam.  A CT scan or MRI of your head. These imaging tests cannot diagnose migraines, but they can help rule out other causes of headaches. TREATMENT Medicines Kateryna be given for pain and nausea. Medicines can also be given to help prevent recurrent migraines.  HOME CARE INSTRUCTIONS  Only take over-the-counter or prescription medicines for pain or discomfort as directed by your health care provider. The use of long-term narcotics is not recommended.  Lie down in a dark, quiet room when you have a migraine.  Keep a journal to find out what Arayna trigger your migraine headaches. For example, write down:  What you eat and drink.  How much sleep you get.  Any change to your diet or medicines.  Limit alcohol consumption.  Quit smoking if you smoke.  Get 7-9 hours of sleep, or as recommended by your health care provider.  Limit stress.  Keep lights dim if bright lights bother you and make your migraines worse. SEEK IMMEDIATE MEDICAL CARE IF:   Your migraine becomes severe.  You have a fever.  You have a stiff neck.  You have vision loss.  You have muscular weakness or loss of muscle control.  You start losing your balance or have trouble walking.  You feel faint or pass out.  You have severe symptoms that are different from your first symptoms. MAKE SURE YOU:   Understand these instructions.  Will watch your condition.  Will get help right away if you are not doing well or get worse.   This information is not intended to replace advice given to you by your health care provider. Make sure you discuss any questions you have with your health care provider.   Document  Released: 11/09/2005 Document Revised: 11/30/2014 Document Reviewed: 07/17/2013 Elsevier Interactive Patient Education [employer].

## 2015-10-06 ENCOUNTER — Emergency Department (HOSPITAL_COMMUNITY)
Admission: EM | Admit: 2015-10-06 | Discharge: 2015-10-06 | Disposition: A | Discharge status: Home / Self Care | Payer: Medicare HMO | Attending: Emergency Medicine | Admitting: Emergency Medicine

## 2015-10-06 ENCOUNTER — Encounter (HOSPITAL_COMMUNITY): Payer: Self-pay | Admitting: Emergency Medicine

## 2015-10-06 ENCOUNTER — Emergency Department (HOSPITAL_COMMUNITY): Payer: Medicare HMO

## 2015-10-06 DIAGNOSIS — K219 Gastro-esophageal reflux disease without esophagitis: Secondary | ICD-10-CM | POA: Insufficient documentation

## 2015-10-06 DIAGNOSIS — J40 Bronchitis, not specified as acute or chronic: Secondary | ICD-10-CM

## 2015-10-06 DIAGNOSIS — G8929 Other chronic pain: Secondary | ICD-10-CM | POA: Insufficient documentation

## 2015-10-06 DIAGNOSIS — E119 Type 2 diabetes mellitus without complications: Secondary | ICD-10-CM | POA: Insufficient documentation

## 2015-10-06 DIAGNOSIS — R05 Cough: Secondary | ICD-10-CM | POA: Diagnosis present

## 2015-10-06 DIAGNOSIS — Z8739 Personal history of other diseases of the musculoskeletal system and connective tissue: Secondary | ICD-10-CM | POA: Insufficient documentation

## 2015-10-06 DIAGNOSIS — I10 Essential (primary) hypertension: Secondary | ICD-10-CM | POA: Diagnosis not present

## 2015-10-06 DIAGNOSIS — Z720 Tobacco use: Secondary | ICD-10-CM | POA: Insufficient documentation

## 2015-10-06 DIAGNOSIS — J029 Acute pharyngitis, unspecified: Secondary | ICD-10-CM | POA: Diagnosis not present

## 2015-10-06 MED ORDER — SALINE SPRAY 0.65 % NA SOLN: 1.0000 | NASAL | Status: DC | PRN

## 2015-10-06 MED ORDER — HYDROCOD POLST-CPM POLST ER 10-8 MG/5ML PO SUER
5.0000 mL | Freq: Once | ORAL | Status: AC
Start: 2015-10-06 — End: 2015-10-06
  Administered 2015-10-06: 5 mL via ORAL
  Filled 2015-10-06: qty 5

## 2015-10-06 MED ORDER — HYDROCOD POLST-CPM POLST ER 10-8 MG/5ML PO SUER: 5.0000 mL | Freq: Two times a day (BID) | ORAL | Status: DC | PRN

## 2015-10-06 NOTE — ED Provider Notes (Signed)
CSN: [id number]     Arrival date & time 10/06/15  2004 History  By signing my name below, I, Julien Nordmann, attest that this documentation has been prepared under the direction and in the presence of Debroah Baller, NP. Electronically Signed: Julien Nordmann, ED Scribe. 10/06/2015. 8:43 PM.    Chief Complaint  Patient presents with  . Cough      Patient is a 69 y.o. female presenting with cough. The history is provided by the patient. No language interpreter was used.  Cough Cough characteristics:  Productive Sputum characteristics:  Unable to specify Onset quality:  Sudden Duration:  2 weeks Timing:  Constant Progression:  Worsening Chronicity:  New Smoker: yes   Relieved by:  Nothing Worsened by:  Deep breathing and smoking Ineffective treatments:  Decongestant Associated symptoms: rhinorrhea and sore throat   Associated symptoms: no ear pain    HPI Comments: [name] is a 69 y.o. female who HTN, type II DM presents to the Emergency Department complaining of constant, gradual worsening productive cough onset two weeks ago. Pt has been having associated post nasal drip, sore throat and reports she had bladder incontinence due to severity of cough. Pt reports she has been having sinus trouble for the past two weeks and received a z-pack and nasonex with no relief. Pt states she used some listerine and it seemed to temporarily get rid of the cough. Pt is a smoker and reports smoking at least 5 or 6 cigarettes a day.  Past Medical History  Diagnosis Date  . Diabetes mellitus   . Hypertension   . GERD (gastroesophageal reflux disease)   . Herniated disc   . Arthritis   . Shortness of breath on exertion 11/10/11    "sometimes"  . H/O hiatal hernia   . Headache(784.0)   . Hemorrhoids   . Chronic back pain greater than 3 months duration   . Vertigo   . Chronic neck pain   . Hx of echocardiogram 2010    normal EF   Past Surgical History  Procedure Laterality Date  . Knee  surgery      right  . Shoulder open rotator cuff repair  07/20/2011    left  . Fracture surgery  2004?    right shoulder  . Knee arthroscopy  2004    right  . Tonsillectomy    . Cholecystectomy  ~ 2008  . Abdominal hysterectomy  1997?    "partial"  . Neck fusion     No family history on file. Social History  Substance Use Topics  . Smoking status: Current Some Day Smoker -- 0.20 packs/day for 40 years    Types: Cigarettes  . Smokeless tobacco: Never Used     Comment: smoking cessation consult entered  . Alcohol Use: No   OB History    No data available     Review of Systems  HENT: Positive for rhinorrhea and sore throat. Negative for ear pain.   Respiratory: Positive for cough.   All other systems reviewed and are negative.     Allergies  Oxycodone  Home Medications   Prior to Admission medications   Medication Sig Start Date End Date Taking? Authorizing Provider  ALPRAZolam (XANAX) 0.25 MG tablet Take 0.25 mg by mouth 2 (two) times daily as needed for anxiety or sleep.  10/16/14   Historical Provider, MD  amLODipine (NORVASC) 5 MG tablet Take 5 mg by mouth 2 (two) times daily.     Historical Provider,  MD  chlorpheniramine-HYDROcodone (TUSSIONEX PENNKINETIC ER) 10-8 MG/5ML SUER Take 5 mLs by mouth every 12 (twelve) hours as needed for cough. 10/06/15   Hope Bunnie Pion, NP  esomeprazole (NEXIUM) 40 MG capsule Take 40 mg by mouth daily as needed (for heartburn).     Historical Provider, MD  losartan (COZAAR) 100 MG tablet Take 100 mg by mouth daily. 09/20/14   Historical Provider, MD  metFORMIN (GLUCOPHAGE) 500 MG tablet Take 500 mg by mouth 2 (two) times daily. 01/06/14   Historical Provider, MD  metoprolol (LOPRESSOR) 50 MG tablet Take 50 mg by mouth 2 (two) times daily. 09/03/14   Historical Provider, MD  potassium chloride (K-DUR) 10 MEQ tablet Take 20 mEq by mouth daily.    Historical Provider, MD  sodium chloride (OCEAN) 0.65 % SOLN nasal spray Place 1 spray into both  nostrils as needed for congestion. 10/06/15   [place], NP   Triage vitals: BP 164/79 mmHg  Pulse 58  Temp(Src) 97.7 F (36.5 C) (Oral)  Resp 18  Wt 226 lb 9.6 oz (102.785 kg)  SpO2 99% Physical Exam  Constitutional: She is oriented to person, place, and time. She appears well-developed and well-nourished.  HENT:  Normal TM's, uvula is midline, no edema or erythema, no lymphadenopathy  Eyes: EOM are normal. Pupils are equal, round, and reactive to light.  Right sclera is injected  Neck: Neck supple.  Cardiovascular: Normal rate, regular rhythm and normal heart sounds.   Pulmonary/Chest: Effort normal. She has no wheezes. She has no rales.  Abdominal: Soft. There is no tenderness.  Musculoskeletal: Normal range of motion.  Neurological: She is alert and oriented to person, place, and time. No cranial nerve deficit.  Skin: Skin is warm and dry.  Nursing note and vitals reviewed.   ED Course  Procedures  CXR, Tussionex for cough Symptoms improved with Tussionex  DIAGNOSTIC STUDIES: Oxygen Saturation is 99% on RA, normal by my interpretation.  COORDINATION OF CARE:  8:42 PM Discussed treatment plan with pt at bedside and pt agreed to plan.  Imaging Review Dg Chest 2 View  10/06/2015  CLINICAL DATA:  Cough, shortness of breath and chest congestion. EXAM: CHEST  2 VIEW COMPARISON:  01/25/2015 FINDINGS: Lungs are adequately inflated without consolidation or effusion. Cardiomediastinal silhouette is within normal. There is minimal calcified plaque over the aortic arch. There are mild degenerative changes of the spine. IMPRESSION: No active cardiopulmonary disease. Electronically Signed   By: Marin Olp M.D.   On: 10/06/2015 22:05   10:15 pm I discussed this case with Dr. Kathrynn Humble. Will treat for cough with Tussionex and give NS nasal spray.  MDM  69 y.o. female with cough, congestion and post nasal drainage x 2 weeks. Stable for d/c without respiratory distress and O2 SAT  99% on R/A, normal CXR. Patient to follow up with her PCP or return here for worsening symptoms.   Final diagnoses:  Bronchitis   I personally performed the services described in this documentation, which was scribed in my presence. The recorded information has been reviewed and is accurate.   [address] [place], NP 10/06/15 [place], MD 10/07/15 0002

## 2015-10-06 NOTE — ED Notes (Signed)
C/o non-productive cough since yesterday with post-nasal drip.  Using Nasonex without relief.

## 2015-10-11 ENCOUNTER — Inpatient Hospital Stay (HOSPITAL_COMMUNITY)
Admission: AD | Admit: 2015-10-11 | Discharge: 2015-10-15 | DRG: 192 | Disposition: A | Discharge status: Home / Self Care | Payer: Medicare HMO | Source: Ambulatory Visit | Attending: Cardiovascular Disease | Admitting: Cardiovascular Disease

## 2015-10-11 ENCOUNTER — Inpatient Hospital Stay (HOSPITAL_COMMUNITY): Payer: Medicare HMO

## 2015-10-11 DIAGNOSIS — M5080 Other cervical disc disorders, unspecified cervical region: Secondary | ICD-10-CM | POA: Diagnosis present

## 2015-10-11 DIAGNOSIS — J322 Chronic ethmoidal sinusitis: Secondary | ICD-10-CM | POA: Diagnosis present

## 2015-10-11 DIAGNOSIS — E669 Obesity, unspecified: Secondary | ICD-10-CM | POA: Diagnosis present

## 2015-10-11 DIAGNOSIS — Z79899 Other long term (current) drug therapy: Secondary | ICD-10-CM

## 2015-10-11 DIAGNOSIS — I129 Hypertensive chronic kidney disease with stage 1 through stage 4 chronic kidney disease, or unspecified chronic kidney disease: Secondary | ICD-10-CM | POA: Diagnosis present

## 2015-10-11 DIAGNOSIS — J449 Chronic obstructive pulmonary disease, unspecified: Secondary | ICD-10-CM | POA: Diagnosis present

## 2015-10-11 DIAGNOSIS — Z885 Allergy status to narcotic agent status: Secondary | ICD-10-CM

## 2015-10-11 DIAGNOSIS — R51 Headache: Secondary | ICD-10-CM | POA: Diagnosis not present

## 2015-10-11 DIAGNOSIS — M549 Dorsalgia, unspecified: Secondary | ICD-10-CM | POA: Diagnosis present

## 2015-10-11 DIAGNOSIS — R519 Headache, unspecified: Secondary | ICD-10-CM | POA: Diagnosis present

## 2015-10-11 DIAGNOSIS — M542 Cervicalgia: Secondary | ICD-10-CM | POA: Diagnosis present

## 2015-10-11 DIAGNOSIS — F17210 Nicotine dependence, cigarettes, uncomplicated: Secondary | ICD-10-CM | POA: Diagnosis present

## 2015-10-11 DIAGNOSIS — F419 Anxiety disorder, unspecified: Secondary | ICD-10-CM | POA: Diagnosis present

## 2015-10-11 DIAGNOSIS — Z6833 Body mass index (BMI) 33.0-33.9, adult: Secondary | ICD-10-CM | POA: Diagnosis not present

## 2015-10-11 DIAGNOSIS — G8929 Other chronic pain: Secondary | ICD-10-CM | POA: Diagnosis present

## 2015-10-11 DIAGNOSIS — Z7984 Long term (current) use of oral hypoglycemic drugs: Secondary | ICD-10-CM

## 2015-10-11 DIAGNOSIS — I10 Essential (primary) hypertension: Secondary | ICD-10-CM | POA: Diagnosis present

## 2015-10-11 DIAGNOSIS — E1122 Type 2 diabetes mellitus with diabetic chronic kidney disease: Secondary | ICD-10-CM | POA: Diagnosis present

## 2015-10-11 DIAGNOSIS — M5090 Cervical disc disorder, unspecified, unspecified cervical region: Secondary | ICD-10-CM | POA: Diagnosis present

## 2015-10-11 DIAGNOSIS — N183 Chronic kidney disease, stage 3 (moderate): Secondary | ICD-10-CM | POA: Diagnosis present

## 2015-10-11 DIAGNOSIS — E119 Type 2 diabetes mellitus without complications: Secondary | ICD-10-CM

## 2015-10-11 DIAGNOSIS — J441 Chronic obstructive pulmonary disease with (acute) exacerbation: Secondary | ICD-10-CM | POA: Diagnosis present

## 2015-10-11 LAB — CBC WITH DIFFERENTIAL/PLATELET
BASOS ABS: 0 10*3/uL (ref 0.0–0.1)
BASOS PCT: 0 %
EOS ABS: 0.3 10*3/uL (ref 0.0–0.7)
Eosinophils Relative: 4 %
HEMATOCRIT: 38.6 % (ref 36.0–46.0)
HEMOGLOBIN: 12.9 g/dL (ref 12.0–15.0)
Lymphocytes Relative: 47 %
Lymphs Abs: 3.2 10*3/uL (ref 0.7–4.0)
MCH: 29.9 pg (ref 26.0–34.0)
MCHC: 33.4 g/dL (ref 30.0–36.0)
MCV: 89.4 fL (ref 78.0–100.0)
MONOS PCT: 8 %
Monocytes Absolute: 0.5 10*3/uL (ref 0.1–1.0)
NEUTROS ABS: 2.7 10*3/uL (ref 1.7–7.7)
NEUTROS PCT: 41 %
Platelets: 293 10*3/uL (ref 150–400)
RBC: 4.32 MIL/uL (ref 3.87–5.11)
RDW: 12.6 % (ref 11.5–15.5)
WBC: 6.7 10*3/uL (ref 4.0–10.5)

## 2015-10-11 LAB — COMPREHENSIVE METABOLIC PANEL
ALK PHOS: 127 U/L — AB (ref 38–126)
ALT: 29 U/L (ref 14–54)
ANION GAP: 9 (ref 5–15)
AST: 24 U/L (ref 15–41)
Albumin: 3.4 g/dL — ABNORMAL LOW (ref 3.5–5.0)
BUN: 10 mg/dL (ref 6–20)
CALCIUM: 9 mg/dL (ref 8.9–10.3)
CO2: 24 mmol/L (ref 22–32)
CREATININE: 1.05 mg/dL — AB (ref 0.44–1.00)
Chloride: 105 mmol/L (ref 101–111)
GFR, EST NON AFRICAN AMERICAN: 53 mL/min — AB (ref >60)
Glucose, Bld: 138 mg/dL — ABNORMAL HIGH (ref 65–99)
Potassium: 3.5 mmol/L (ref 3.5–5.1)
SODIUM: 138 mmol/L (ref 135–145)
TOTAL PROTEIN: 6.8 g/dL (ref 6.5–8.1)
Total Bilirubin: 0.3 mg/dL (ref 0.3–1.2)

## 2015-10-11 MED ORDER — METHYLPREDNISOLONE SODIUM SUCC 125 MG IJ SOLR
60.0000 mg | Freq: Every day | INTRAMUSCULAR | Status: AC
  Administered 2015-10-11 – 2015-10-12 (×2): 60 mg via INTRAVENOUS
  Filled 2015-10-11 (×3): qty 2

## 2015-10-11 MED ORDER — ACETAMINOPHEN 650 MG RE SUPP: 650.0000 mg | Freq: Four times a day (QID) | RECTAL | Status: DC | PRN

## 2015-10-11 MED ORDER — HEPARIN SODIUM (PORCINE) 5000 UNIT/ML IJ SOLN
5000.0000 [IU] | Freq: Three times a day (TID) | INTRAMUSCULAR | Status: DC
  Administered 2015-10-11 – 2015-10-15 (×12): 5000 [IU] via SUBCUTANEOUS
  Filled 2015-10-11 (×12): qty 1

## 2015-10-11 MED ORDER — SODIUM CHLORIDE 0.9 % IJ SOLN
3.0000 mL | Freq: Two times a day (BID) | INTRAMUSCULAR | Status: DC
  Administered 2015-10-11 – 2015-10-13 (×2): 3 mL via INTRAVENOUS

## 2015-10-11 MED ORDER — SODIUM CHLORIDE 0.9 % IV SOLN
INTRAVENOUS | Status: DC
  Administered 2015-10-11 – 2015-10-14 (×5): via INTRAVENOUS

## 2015-10-11 MED ORDER — ONDANSETRON HCL 4 MG/2ML IJ SOLN: 4.0000 mg | Freq: Four times a day (QID) | INTRAMUSCULAR | Status: DC | PRN

## 2015-10-11 MED ORDER — GUAIFENESIN ER 600 MG PO TB12
600.0000 mg | ORAL_TABLET | Freq: Two times a day (BID) | ORAL | Status: DC
  Administered 2015-10-11 – 2015-10-15 (×9): 600 mg via ORAL
  Filled 2015-10-11 (×9): qty 1

## 2015-10-11 MED ORDER — ADULT MULTIVITAMIN W/MINERALS CH
1.0000 | ORAL_TABLET | Freq: Every day | ORAL | Status: DC
  Administered 2015-10-11 – 2015-10-15 (×5): 1 via ORAL
  Filled 2015-10-11 (×5): qty 1

## 2015-10-11 MED ORDER — DOCUSATE SODIUM 100 MG PO CAPS
100.0000 mg | ORAL_CAPSULE | Freq: Two times a day (BID) | ORAL | Status: DC
  Administered 2015-10-11 – 2015-10-14 (×8): 100 mg via ORAL
  Filled 2015-10-11 (×9): qty 1

## 2015-10-11 MED ORDER — IPRATROPIUM-ALBUTEROL 0.5-2.5 (3) MG/3ML IN SOLN
3.0000 mL | Freq: Four times a day (QID) | RESPIRATORY_TRACT | Status: DC
  Administered 2015-10-11 – 2015-10-12 (×6): 3 mL via RESPIRATORY_TRACT
  Filled 2015-10-11 (×6): qty 3

## 2015-10-11 MED ORDER — DEXTROSE 5 % IV SOLN
1.0000 g | INTRAVENOUS | Status: DC
  Administered 2015-10-11 – 2015-10-14 (×4): 1 g via INTRAVENOUS
  Filled 2015-10-11 (×5): qty 10

## 2015-10-11 MED ORDER — ALBUTEROL SULFATE (2.5 MG/3ML) 0.083% IN NEBU: 2.5000 mg | INHALATION_SOLUTION | Freq: Four times a day (QID) | RESPIRATORY_TRACT | Status: DC

## 2015-10-11 MED ORDER — ACETAMINOPHEN 325 MG PO TABS
650.0000 mg | ORAL_TABLET | Freq: Four times a day (QID) | ORAL | Status: DC | PRN
  Administered 2015-10-12 – 2015-10-15 (×8): 650 mg via ORAL
  Filled 2015-10-11 (×8): qty 2

## 2015-10-11 MED ORDER — ONDANSETRON HCL 4 MG PO TABS: 4.0000 mg | ORAL_TABLET | Freq: Four times a day (QID) | ORAL | Status: DC | PRN

## 2015-10-11 MED ORDER — IPRATROPIUM BROMIDE 0.02 % IN SOLN: 0.5000 mg | Freq: Four times a day (QID) | RESPIRATORY_TRACT | Status: DC

## 2015-10-11 NOTE — H&P (Signed)
Referring Physician:  Annalyn F Michelotti is an 69 y.o. female.                       Chief Complaint: Cough and shortness of breath  HPI: 69 y.o. female who HTN, type II DM presents to the Emergency Department complaining of constant, gradual worsening productive cough onset two weeks ago. Pt has been having associated post nasal drip, sore throat and reports she had bladder incontinence due to severity of cough. Pt reports she has been having sinus trouble for the past two weeks and received a z-pack and nasonex with no relief. Pt states she used some listerine and it seemed to temporarily get rid of the cough. Pt is a smoker and reports smoking at least 5 or 6 cigarettes a day for 50 years. Now has chest pain with cough.  Past Medical History  Diagnosis Date  . Diabetes mellitus   . Hypertension   . GERD (gastroesophageal reflux disease)   . Herniated disc   . Arthritis   . Shortness of breath on exertion 11/10/11    "sometimes"  . H/O hiatal hernia   . Headache(784.0)   . Hemorrhoids   . Chronic back pain greater than 3 months duration   . Vertigo   . Chronic neck pain   . Hx of echocardiogram 2010    normal EF      Past Surgical History  Procedure Laterality Date  . Knee surgery      right  . Shoulder open rotator cuff repair  07/20/2011    left  . Fracture surgery  2004?    right shoulder  . Knee arthroscopy  2004    right  . Tonsillectomy    . Cholecystectomy  ~ 2008  . Abdominal hysterectomy  1997?    "partial"  . Neck fusion      No family history on file. Social History:  reports that she has been smoking Cigarettes.  She has a 8 pack-year smoking history. She has never used smokeless tobacco. She reports that she does not drink alcohol or use illicit drugs.  Allergies:  Allergies  Allergen Reactions  . Oxycodone Itching    Medications Prior to Admission  Medication Sig Dispense Refill  . ALPRAZolam (XANAX) 0.25 MG tablet Take 0.25 mg by mouth 2 (two) times  daily as needed for anxiety or sleep.   2  . amLODipine (NORVASC) 5 MG tablet Take 5 mg by mouth 2 (two) times daily.     . chlorpheniramine-HYDROcodone (TUSSIONEX PENNKINETIC ER) 10-8 MG/5ML SUER Take 5 mLs by mouth every 12 (twelve) hours as needed for cough. 100 mL 0  . cloNIDine (CATAPRES) 0.1 MG tablet Take 0.05 mg by mouth at bedtime.    Marland Kitchen esomeprazole (NEXIUM) 40 MG capsule Take 40 mg by mouth daily as needed (for heartburn).     . hydrALAZINE (APRESOLINE) 50 MG tablet Take 50 mg by mouth 3 (three) times daily.    Marland Kitchen losartan (COZAAR) 100 MG tablet Take 100 mg by mouth daily.  3  . metFORMIN (GLUCOPHAGE) 500 MG tablet Take 500 mg by mouth 2 (two) times daily.    . metoprolol (LOPRESSOR) 50 MG tablet Take 50 mg by mouth 2 (two) times daily.  3  . NITROSTAT 0.4 MG SL tablet PLACE 1 TABLET UNDER TONGUE IF NEEDED FOR CHEST PAIN EVERY 5 MINUTES FOR MAX OF 3 DOSES  1  . potassium chloride (K-DUR) 10 MEQ tablet Take  20 mEq by mouth daily.    Marland Kitchen PROAIR HFA 108 (90 BASE) MCG/ACT inhaler INHALE 2 PUFFS 4 TIMES A DAY AS NEEDED shortness of breath  6  . rosuvastatin (CRESTOR) 10 MG tablet Take 10 mg by mouth daily.  6  . sodium chloride (OCEAN) 0.65 % SOLN nasal spray Place 1 spray into both nostrils as needed for congestion. 1 Bottle 0    No results found for this or any previous visit (from the past 48 hour(s)). No results found.  Review Of Systems Constitutional: Negative for fever and chills.  Pulmonary: Positive for Asthma, COPD, Cough. Gastrointestinal: Negative for nausea, vomiting, abdominal pain and diarrhea.  Genitourinary: Positive for frequency. Negative for dysuria, hematuria and decreased urine volume.  Skin: Negative for rash and wound.  Neurological: Positive for weakness and numbness due to cervical disc disease.   Blood pressure 152/70, pulse 64, temperature 98.2 F (36.8 C), temperature source Oral, resp. rate 18, height '5\' 7"'$  (1.702 m), weight 97.977 kg (216 lb), SpO2 97  %. Physical Exam: General appearance: alert, cooperative and appears stated age. Well built and nourished. Head: Normocephalic, without obvious abnormality, atraumatic Eyes: Brown eyes, conjunctivae/corneas clear. PERRL, EOM's intact. Sclera-white Neck: No adenopathy, no carotid bruit, no JVD, supple, symmetrical, trachea midline and thyroid not enlarged, scar of surgery. Resp: Wheezing on auscultation bilaterally Cardio: regular rate and rhythm, S1, S2 normal, II / VI systolic murmur,  GI: soft, non-tender; bowel sounds normal; no masses, no organomegaly. Scar of GB surgery. Extremities: extremities normal, atraumatic, no cyanosis or edema. + clubbing. Skin: Skin color, texture, turgor normal. No rashes or lesions Neurologic: Alert and oriented X 3, normal strength and tone. Normal coordination and gait. Psychiatry: Appears anxious  Assessment/Plan Acute exacerbation of COPD Anxiety Cervical disc disease DM, II Hypertension Obesity  Admit/IV antibiotics and IV solumedrol/Home medications.  Birdie Riddle, MD  10/11/2015, 1:55 PM

## 2015-10-11 NOTE — Progress Notes (Signed)
Utilization review completed.  L. J. Leiah Giannotti RN, BSN, CM 

## 2015-10-12 LAB — CBC
HEMATOCRIT: 39.1 % (ref 36.0–46.0)
Hemoglobin: 12.9 g/dL (ref 12.0–15.0)
MCH: 29.9 pg (ref 26.0–34.0)
MCHC: 33 g/dL (ref 30.0–36.0)
MCV: 90.5 fL (ref 78.0–100.0)
PLATELETS: 307 10*3/uL (ref 150–400)
RBC: 4.32 MIL/uL (ref 3.87–5.11)
RDW: 12.6 % (ref 11.5–15.5)
WBC: 5.7 10*3/uL (ref 4.0–10.5)

## 2015-10-12 LAB — BASIC METABOLIC PANEL
ANION GAP: 12 (ref 5–15)
BUN: 13 mg/dL (ref 6–20)
CALCIUM: 8.8 mg/dL — AB (ref 8.9–10.3)
CO2: 22 mmol/L (ref 22–32)
CREATININE: 1.18 mg/dL — AB (ref 0.44–1.00)
Chloride: 105 mmol/L (ref 101–111)
GFR, EST AFRICAN AMERICAN: 54 mL/min — AB (ref >60)
GFR, EST NON AFRICAN AMERICAN: 46 mL/min — AB (ref >60)
GLUCOSE: 264 mg/dL — AB (ref 65–99)
Potassium: 3.6 mmol/L (ref 3.5–5.1)
Sodium: 139 mmol/L (ref 135–145)

## 2015-10-12 LAB — HEMOGLOBIN A1C
HEMOGLOBIN A1C: 6.8 % — AB (ref 4.8–5.6)
MEAN PLASMA GLUCOSE: 148 mg/dL

## 2015-10-12 MED ORDER — NITROGLYCERIN 0.4 MG SL SUBL: 0.4000 mg | SUBLINGUAL_TABLET | SUBLINGUAL | Status: DC | PRN

## 2015-10-12 MED ORDER — CLONIDINE HCL 0.1 MG PO TABS
0.0500 mg | ORAL_TABLET | Freq: Every day | ORAL | Status: DC
  Administered 2015-10-13 – 2015-10-14 (×3): 0.05 mg via ORAL
  Filled 2015-10-12 (×3): qty 1

## 2015-10-12 MED ORDER — LOSARTAN POTASSIUM 50 MG PO TABS
100.0000 mg | ORAL_TABLET | Freq: Every day | ORAL | Status: DC
  Administered 2015-10-12 – 2015-10-15 (×4): 100 mg via ORAL
  Filled 2015-10-12 (×5): qty 2

## 2015-10-12 MED ORDER — ALBUTEROL SULFATE (2.5 MG/3ML) 0.083% IN NEBU: 3.0000 mL | INHALATION_SOLUTION | Freq: Four times a day (QID) | RESPIRATORY_TRACT | Status: DC | PRN

## 2015-10-12 MED ORDER — GUAIFENESIN-DM 100-10 MG/5ML PO SYRP
10.0000 mL | ORAL_SOLUTION | ORAL | Status: DC | PRN
  Administered 2015-10-12 – 2015-10-15 (×4): 10 mL via ORAL
  Filled 2015-10-12 (×4): qty 10

## 2015-10-12 MED ORDER — IPRATROPIUM-ALBUTEROL 0.5-2.5 (3) MG/3ML IN SOLN
3.0000 mL | Freq: Three times a day (TID) | RESPIRATORY_TRACT | Status: DC
  Administered 2015-10-13 – 2015-10-15 (×6): 3 mL via RESPIRATORY_TRACT
  Filled 2015-10-12 (×8): qty 3

## 2015-10-12 MED ORDER — ALPRAZOLAM 0.25 MG PO TABS
0.2500 mg | ORAL_TABLET | Freq: Two times a day (BID) | ORAL | Status: DC | PRN
  Administered 2015-10-14 (×2): 0.25 mg via ORAL
  Filled 2015-10-12 (×2): qty 1

## 2015-10-12 MED ORDER — AMLODIPINE BESYLATE 5 MG PO TABS
5.0000 mg | ORAL_TABLET | Freq: Two times a day (BID) | ORAL | Status: DC
  Administered 2015-10-12 – 2015-10-15 (×7): 5 mg via ORAL
  Filled 2015-10-12 (×7): qty 1

## 2015-10-12 MED ORDER — PANTOPRAZOLE SODIUM 40 MG PO TBEC
40.0000 mg | DELAYED_RELEASE_TABLET | Freq: Every day | ORAL | Status: DC
  Administered 2015-10-12 – 2015-10-15 (×4): 40 mg via ORAL
  Filled 2015-10-12 (×3): qty 1
  Filled 2015-10-12: qty 2

## 2015-10-12 MED ORDER — ROSUVASTATIN CALCIUM 10 MG PO TABS
10.0000 mg | ORAL_TABLET | Freq: Every day | ORAL | Status: DC
  Administered 2015-10-12 – 2015-10-15 (×4): 10 mg via ORAL
  Filled 2015-10-12 (×4): qty 1

## 2015-10-12 MED ORDER — POTASSIUM CHLORIDE ER 10 MEQ PO TBCR
20.0000 meq | EXTENDED_RELEASE_TABLET | Freq: Every day | ORAL | Status: DC
  Administered 2015-10-12 – 2015-10-15 (×4): 20 meq via ORAL
  Filled 2015-10-12 (×8): qty 2

## 2015-10-12 MED ORDER — METFORMIN HCL 500 MG PO TABS
500.0000 mg | ORAL_TABLET | Freq: Two times a day (BID) | ORAL | Status: DC
Start: 2015-10-12 — End: 2015-10-14
  Administered 2015-10-12 – 2015-10-14 (×6): 500 mg via ORAL
  Filled 2015-10-12 (×6): qty 1

## 2015-10-12 NOTE — Progress Notes (Signed)
Ref: Birdie Riddle, MD   Subjective:  Significant coughand wheezing. Also has headache.  Objective:  Vital Signs in the last 24 hours: Temp:  [97.9 F (36.6 C)-98.4 F (36.9 C)] 98.4 F (36.9 C) (11/19 0539) Pulse Rate:  [64-81] 81 (11/19 0539) Cardiac Rhythm:  [-] Normal sinus rhythm (11/18 1901) Resp:  [18] 18 (11/19 0539) BP: (135-152)/(58-70) 143/62 mmHg (11/19 0539) SpO2:  [93 %-98 %] 93 % (11/19 0539) Weight:  [97.977 kg (216 lb)-98.6 kg (217 lb 6 oz)] 98.6 kg (217 lb 6 oz) (11/19 0539)  Physical Exam: BP Readings from Last 1 Encounters:  10/12/15 143/62    Wt Readings from Last 1 Encounters:  10/12/15 98.6 kg (217 lb 6 oz)    Weight change:   HEENT: [place]/AT, Eyes-Brown, PERL, EOMI, Conjunctiva-Pink, Sclera-Non-icteric Neck: No JVD, No bruit, Trachea midline. Lungs:  Rhonchi, Bilateral. Cardiac:  Regular rhythm, normal S1 and S2, no S3. II/VI systolic murmur. Abdomen:  Soft, non-tender. Sar of GB surgery Extremities:  No edema present. No cyanosis. No clubbing. CNS: AxOx3, Cranial nerves grossly intact, moves all 4 extremities. Right handed. Skin: Warm and dry.   Intake/Output from previous day: 11/18 0701 - 11/19 0700 In: 817.5 [P.O.:360; I.V.:407.5; IV Piggyback:50] Out: 975 [Urine:975]    Lab Results: BMET    Component Value Date/Time   NA 139 10/12/2015 0242   NA 138 10/11/2015 1406   NA 139 08/18/2015 1034   K 3.6 10/12/2015 0242   K 3.5 10/11/2015 1406   K 3.9 08/18/2015 1034   CL 105 10/12/2015 0242   CL 105 10/11/2015 1406   CL 102 08/18/2015 1034   CO2 22 10/12/2015 0242   CO2 24 10/11/2015 1406   CO2 25 03/17/2015 2045   GLUCOSE 264* 10/12/2015 0242   GLUCOSE 138* 10/11/2015 1406   GLUCOSE 132* 08/18/2015 1034   BUN 13 10/12/2015 0242   BUN 10 10/11/2015 1406   BUN 12 08/18/2015 1034   CREATININE 1.18* 10/12/2015 0242   CREATININE 1.05* 10/11/2015 1406   CREATININE 1.10* 08/18/2015 1034   CALCIUM 8.8* 10/12/2015 0242   CALCIUM 9.0  10/11/2015 1406   CALCIUM 9.3 03/17/2015 2045   GFRNONAA 46* 10/12/2015 0242   GFRNONAA 53* 10/11/2015 1406   GFRNONAA 56* 03/17/2015 2045   GFRAA 54* 10/12/2015 0242   GFRAA >60 10/11/2015 1406   GFRAA 64* 03/17/2015 2045   CBC    Component Value Date/Time   WBC 5.7 10/12/2015 0242   RBC 4.32 10/12/2015 0242   HGB 12.9 10/12/2015 0242   HCT 39.1 10/12/2015 0242   PLT 307 10/12/2015 0242   MCV 90.5 10/12/2015 0242   MCH 29.9 10/12/2015 0242   MCHC 33.0 10/12/2015 0242   RDW 12.6 10/12/2015 0242   LYMPHSABS 3.2 10/11/2015 1406   MONOABS 0.5 10/11/2015 1406   EOSABS 0.3 10/11/2015 1406   BASOSABS 0.0 10/11/2015 1406   HEPATIC Function Panel  Recent Labs  01/25/15 1705 03/17/15 2045 10/11/15 1406  PROT 6.4 7.9 6.8   HEMOGLOBIN A1C No components found for: HGA1C,  MPG CARDIAC ENZYMES Lab Results  Component Value Date   CKTOTAL 95 01/01/2009   CKMB 0.8 01/01/2009   TROPONINI <0.01        NO INDICATION OF MYOCARDIAL INJURY. 01/01/2009   TROPONINI <0.01        NO INDICATION OF MYOCARDIAL INJURY. 01/01/2009   TROPONINI <0.01        NO INDICATION OF MYOCARDIAL INJURY. 12/31/2008   BNP No results  for input(s): PROBNP in the last 8760 hours. TSH No results for input(s): TSH in the last 8760 hours. CHOLESTEROL No results for input(s): CHOL in the last 8760 hours.  Scheduled Meds: . amLODipine  5 mg Oral BID  . cefTRIAXone (ROCEPHIN)  IV  1 g Intravenous Q24H  . cloNIDine  0.05 mg Oral QHS  . docusate sodium  100 mg Oral BID  . guaiFENesin  600 mg Oral BID  . heparin  5,000 Units Subcutaneous 3 times per day  . ipratropium-albuterol  3 mL Nebulization Q6H  . losartan  100 mg Oral Daily  . metFORMIN  500 mg Oral BID WC  . methylPREDNISolone (SOLU-MEDROL) injection  60 mg Intravenous Daily  . multivitamin with minerals  1 tablet Oral Daily  . pantoprazole  40 mg Oral Daily  . potassium chloride  20 mEq Oral Daily  . rosuvastatin  10 mg Oral Daily  . sodium  chloride  3 mL Intravenous Q12H   Continuous Infusions: . sodium chloride 50 mL/hr at 10/11/15 1351   PRN Meds:.acetaminophen **OR** acetaminophen, albuterol, ALPRAZolam, nitroGLYCERIN, ondansetron **OR** ondansetron (ZOFRAN) IV  Assessment/Plan: Acute exacerbation of COPD Anxiety Cervical disc disease DM, II Hypertension Obesity  Resume home medications.    LOS: 1 day    Dixie Dials  MD  10/12/2015, 7:40 AM

## 2015-10-13 MED ORDER — POLYETHYLENE GLYCOL 3350 17 G PO PACK
17.0000 g | PACK | Freq: Every day | ORAL | Status: DC
  Administered 2015-10-13 – 2015-10-14 (×2): 17 g via ORAL
  Filled 2015-10-13 (×3): qty 1

## 2015-10-13 MED ORDER — PREDNISONE 20 MG PO TABS
40.0000 mg | ORAL_TABLET | Freq: Every day | ORAL | Status: AC
  Administered 2015-10-13 – 2015-10-14 (×2): 40 mg via ORAL
  Filled 2015-10-13 (×2): qty 2

## 2015-10-13 NOTE — Progress Notes (Signed)
Ref: Birdie Riddle, MD   Subjective:  Improving today. Less cough and wheezing.  Objective:  Vital Signs in the last 24 hours: Temp:  [98 F (36.7 C)-98.9 F (37.2 C)] 98.2 F (36.8 C) (11/20 0855) Pulse Rate:  [72-96] 85 (11/20 0855) Cardiac Rhythm:  [-] Normal sinus rhythm (11/20 0855) Resp:  [18-20] 18 (11/20 0855) BP: (122-158)/(46-68) 149/65 mmHg (11/20 0855) SpO2:  [93 %-100 %] 96 % (11/20 0855) Weight:  [100.154 kg (220 lb 12.8 oz)] 100.154 kg (220 lb 12.8 oz) (11/20 0551)  Physical Exam: BP Readings from Last 1 Encounters:  10/13/15 149/65    Wt Readings from Last 1 Encounters:  10/13/15 100.154 kg (220 lb 12.8 oz)    Weight change: 2.177 kg (4 lb 12.8 oz)  HEENT: [place]/AT, Eyes-Brown, PERL, EOMI, Conjunctiva-Pink, Sclera-Non-icteric Neck: No JVD, No bruit, Trachea midline. Lungs:  Wheezing, Bilateral. Cardiac:  Regular rhythm, normal S1 and S2, no S3. II/VI systolic murmur. Abdomen:  Soft, non-tender. Extremities:  No edema present. No cyanosis. No clubbing. CNS: AxOx3, Cranial nerves grossly intact, moves all 4 extremities. Right handed. Skin: Warm and dry.   Intake/Output from previous day: 11/19 0701 - 11/20 0700 In: 3474 [P.O.:1824; I.V.:1650] Out: 2050 [Urine:2050]    Lab Results: BMET    Component Value Date/Time   NA 139 10/12/2015 0242   NA 138 10/11/2015 1406   NA 139 08/18/2015 1034   K 3.6 10/12/2015 0242   K 3.5 10/11/2015 1406   K 3.9 08/18/2015 1034   CL 105 10/12/2015 0242   CL 105 10/11/2015 1406   CL 102 08/18/2015 1034   CO2 22 10/12/2015 0242   CO2 24 10/11/2015 1406   CO2 25 03/17/2015 2045   GLUCOSE 264* 10/12/2015 0242   GLUCOSE 138* 10/11/2015 1406   GLUCOSE 132* 08/18/2015 1034   BUN 13 10/12/2015 0242   BUN 10 10/11/2015 1406   BUN 12 08/18/2015 1034   CREATININE 1.18* 10/12/2015 0242   CREATININE 1.05* 10/11/2015 1406   CREATININE 1.10* 08/18/2015 1034   CALCIUM 8.8* 10/12/2015 0242   CALCIUM 9.0 10/11/2015 1406   CALCIUM 9.3 03/17/2015 2045   GFRNONAA 46* 10/12/2015 0242   GFRNONAA 53* 10/11/2015 1406   GFRNONAA 56* 03/17/2015 2045   GFRAA 54* 10/12/2015 0242   GFRAA >60 10/11/2015 1406   GFRAA 64* 03/17/2015 2045   CBC    Component Value Date/Time   WBC 5.7 10/12/2015 0242   RBC 4.32 10/12/2015 0242   HGB 12.9 10/12/2015 0242   HCT 39.1 10/12/2015 0242   PLT 307 10/12/2015 0242   MCV 90.5 10/12/2015 0242   MCH 29.9 10/12/2015 0242   MCHC 33.0 10/12/2015 0242   RDW 12.6 10/12/2015 0242   LYMPHSABS 3.2 10/11/2015 1406   MONOABS 0.5 10/11/2015 1406   EOSABS 0.3 10/11/2015 1406   BASOSABS 0.0 10/11/2015 1406   HEPATIC Function Panel  Recent Labs  01/25/15 1705 03/17/15 2045 10/11/15 1406  PROT 6.4 7.9 6.8   HEMOGLOBIN A1C No components found for: HGA1C,  MPG CARDIAC ENZYMES Lab Results  Component Value Date   CKTOTAL 95 01/01/2009   CKMB 0.8 01/01/2009   TROPONINI <0.01        NO INDICATION OF MYOCARDIAL INJURY. 01/01/2009   TROPONINI <0.01        NO INDICATION OF MYOCARDIAL INJURY. 01/01/2009   TROPONINI <0.01        NO INDICATION OF MYOCARDIAL INJURY. 12/31/2008   BNP No results for input(s): PROBNP in the  last 8760 hours. TSH No results for input(s): TSH in the last 8760 hours. CHOLESTEROL No results for input(s): CHOL in the last 8760 hours.  Scheduled Meds: . amLODipine  5 mg Oral BID  . cefTRIAXone (ROCEPHIN)  IV  1 g Intravenous Q24H  . cloNIDine  0.05 mg Oral QHS  . docusate sodium  100 mg Oral BID  . guaiFENesin  600 mg Oral BID  . heparin  5,000 Units Subcutaneous 3 times per day  . ipratropium-albuterol  3 mL Nebulization TID  . losartan  100 mg Oral Daily  . metFORMIN  500 mg Oral BID WC  . multivitamin with minerals  1 tablet Oral Daily  . pantoprazole  40 mg Oral Daily  . potassium chloride  20 mEq Oral Daily  . rosuvastatin  10 mg Oral Daily  . sodium chloride  3 mL Intravenous Q12H   Continuous Infusions: . sodium chloride 50 mL/hr at  10/13/15 0752   PRN Meds:.acetaminophen **OR** acetaminophen, albuterol, ALPRAZolam, guaiFENesin-dextromethorphan, nitroGLYCERIN, ondansetron **OR** ondansetron (ZOFRAN) IV  Assessment/Plan: Acute exacerbation of COPD Anxiety Cervical disc disease DM, II Hypertension Obesity  Continue antibiotic. Increase activity.   LOS: 2 days    Dixie Dials  MD  10/13/2015, 10:27 AM

## 2015-10-14 ENCOUNTER — Inpatient Hospital Stay (HOSPITAL_COMMUNITY): Payer: Medicare HMO

## 2015-10-14 MED ORDER — MECLIZINE HCL 25 MG PO TABS
12.5000 mg | ORAL_TABLET | Freq: Two times a day (BID) | ORAL | Status: DC
  Administered 2015-10-14 – 2015-10-15 (×3): 12.5 mg via ORAL
  Filled 2015-10-14 (×3): qty 1

## 2015-10-14 MED ORDER — METFORMIN HCL 500 MG PO TABS
500.0000 mg | ORAL_TABLET | Freq: Two times a day (BID) | ORAL | Status: DC
  Administered 2015-10-15: 500 mg via ORAL
  Filled 2015-10-14: qty 1

## 2015-10-14 NOTE — Progress Notes (Signed)
Pt c/o dizziness and BP 168/60.  MD paged, RN given new orders and will carry out.

## 2015-10-14 NOTE — Progress Notes (Signed)
Pt c/o headache overnight, given tylenol. This AM pt requesting bed alarm be turned off. Pt has been steady on feet and ad lib in room on day shift.

## 2015-10-14 NOTE — Care Management Important Message (Signed)
Important Message  Patient Details  Name: [name] MRN: [id number] Date of Birth: [birth year]   Medicare Important Message Given:  Yes    Barb Merino Friedrich Harriott 10/14/2015, 3:28 PM

## 2015-10-14 NOTE — Progress Notes (Signed)
Ref: Birdie Riddle, MD   Subjective:  Cough continues. Has bad frontal area headache also.  Objective:  Vital Signs in the last 24 hours: Temp:  [97.7 F (36.5 C)-98.4 F (36.9 C)] 97.7 F (36.5 C) (11/21 0803) Pulse Rate:  [70-89] 85 (11/21 0803) Cardiac Rhythm:  [-] Normal sinus rhythm (11/21 0747) Resp:  [16-18] 18 (11/21 0803) BP: (129-156)/(50-73) 156/56 mmHg (11/21 0803) SpO2:  [96 %-99 %] 99 % (11/21 0803) Weight:  [99.247 kg (218 lb 12.8 oz)] 99.247 kg (218 lb 12.8 oz) (11/21 0410)  Physical Exam: BP Readings from Last 1 Encounters:  10/14/15 156/56    Wt Readings from Last 1 Encounters:  10/14/15 99.247 kg (218 lb 12.8 oz)    Weight change: -0.907 kg (-2 lb)  HEENT: [place]/AT, Eyes-Brown, PERL, EOMI, Conjunctiva-Pink, Sclera-Non-icteric Neck: No JVD, No bruit, Trachea midline. Lungs:  Forced expiratory wheeze, Bilateral. Cardiac:  Regular rhythm, normal S1 and S2, no S3. II/VI systolic murmur. Abdomen:  Soft, non-tender. Extremities:  No edema present. No cyanosis. No clubbing. CNS: AxOx3, Cranial nerves grossly intact, moves all 4 extremities. Right handed. Skin: Warm and dry.   Intake/Output from previous day: 11/20 0701 - 11/21 0700 In: 1990 [P.O.:840; I.V.:1150] Out: 3750 [Urine:3750]    Lab Results: BMET    Component Value Date/Time   NA 139 10/12/2015 0242   NA 138 10/11/2015 1406   NA 139 08/18/2015 1034   K 3.6 10/12/2015 0242   K 3.5 10/11/2015 1406   K 3.9 08/18/2015 1034   CL 105 10/12/2015 0242   CL 105 10/11/2015 1406   CL 102 08/18/2015 1034   CO2 22 10/12/2015 0242   CO2 24 10/11/2015 1406   CO2 25 03/17/2015 2045   GLUCOSE 264* 10/12/2015 0242   GLUCOSE 138* 10/11/2015 1406   GLUCOSE 132* 08/18/2015 1034   BUN 13 10/12/2015 0242   BUN 10 10/11/2015 1406   BUN 12 08/18/2015 1034   CREATININE 1.18* 10/12/2015 0242   CREATININE 1.05* 10/11/2015 1406   CREATININE 1.10* 08/18/2015 1034   CALCIUM 8.8* 10/12/2015 0242   CALCIUM 9.0  10/11/2015 1406   CALCIUM 9.3 03/17/2015 2045   GFRNONAA 46* 10/12/2015 0242   GFRNONAA 53* 10/11/2015 1406   GFRNONAA 56* 03/17/2015 2045   GFRAA 54* 10/12/2015 0242   GFRAA >60 10/11/2015 1406   GFRAA 64* 03/17/2015 2045   CBC    Component Value Date/Time   WBC 5.7 10/12/2015 0242   RBC 4.32 10/12/2015 0242   HGB 12.9 10/12/2015 0242   HCT 39.1 10/12/2015 0242   PLT 307 10/12/2015 0242   MCV 90.5 10/12/2015 0242   MCH 29.9 10/12/2015 0242   MCHC 33.0 10/12/2015 0242   RDW 12.6 10/12/2015 0242   LYMPHSABS 3.2 10/11/2015 1406   MONOABS 0.5 10/11/2015 1406   EOSABS 0.3 10/11/2015 1406   BASOSABS 0.0 10/11/2015 1406   HEPATIC Function Panel  Recent Labs  01/25/15 1705 03/17/15 2045 10/11/15 1406  PROT 6.4 7.9 6.8   HEMOGLOBIN A1C No components found for: HGA1C,  MPG CARDIAC ENZYMES Lab Results  Component Value Date   CKTOTAL 95 01/01/2009   CKMB 0.8 01/01/2009   TROPONINI <0.01        NO INDICATION OF MYOCARDIAL INJURY. 01/01/2009   TROPONINI <0.01        NO INDICATION OF MYOCARDIAL INJURY. 01/01/2009   TROPONINI <0.01        NO INDICATION OF MYOCARDIAL INJURY. 12/31/2008   BNP No results for input(s):  PROBNP in the last 8760 hours. TSH No results for input(s): TSH in the last 8760 hours. CHOLESTEROL No results for input(s): CHOL in the last 8760 hours.  Scheduled Meds: . amLODipine  5 mg Oral BID  . cefTRIAXone (ROCEPHIN)  IV  1 g Intravenous Q24H  . cloNIDine  0.05 mg Oral QHS  . docusate sodium  100 mg Oral BID  . guaiFENesin  600 mg Oral BID  . heparin  5,000 Units Subcutaneous 3 times per day  . ipratropium-albuterol  3 mL Nebulization TID  . losartan  100 mg Oral Daily  . metFORMIN  500 mg Oral BID WC  . multivitamin with minerals  1 tablet Oral Daily  . pantoprazole  40 mg Oral Daily  . polyethylene glycol  17 g Oral Daily  . potassium chloride  20 mEq Oral Daily  . rosuvastatin  10 mg Oral Daily  . sodium chloride  3 mL Intravenous Q12H    Continuous Infusions: . sodium chloride 50 mL/hr at 10/14/15 0300   PRN Meds:.acetaminophen **OR** acetaminophen, albuterol, ALPRAZolam, guaiFENesin-dextromethorphan, nitroGLYCERIN, ondansetron **OR** ondansetron (ZOFRAN) IV  Assessment/Plan: Acute exacerbation of COPD Anxiety Cervical disc disease DM, II Hypertension Obesity Headache  CT head without contrast.   LOS: 3 days    Dixie Dials  MD  10/14/2015, 9:10 AM

## 2015-10-14 NOTE — Care Management Important Message (Signed)
Important Message  Patient Details  Name: [name] MRN: [id number] Date of Birth: 1946-06-01   Medicare Important Message Given:  Yes    Barb Merino Duston Smolenski 10/14/2015, 3:28 PM

## 2015-10-15 LAB — BASIC METABOLIC PANEL
Anion gap: 7 (ref 5–15)
BUN: 15 mg/dL (ref 6–20)
CHLORIDE: 104 mmol/L (ref 101–111)
CO2: 29 mmol/L (ref 22–32)
Calcium: 9.2 mg/dL (ref 8.9–10.3)
Creatinine, Ser: 1.18 mg/dL — ABNORMAL HIGH (ref 0.44–1.00)
GFR, EST AFRICAN AMERICAN: 54 mL/min — AB (ref >60)
GFR, EST NON AFRICAN AMERICAN: 46 mL/min — AB (ref >60)
Glucose, Bld: 111 mg/dL — ABNORMAL HIGH (ref 65–99)
POTASSIUM: 3.5 mmol/L (ref 3.5–5.1)
SODIUM: 140 mmol/L (ref 135–145)

## 2015-10-15 LAB — LIPID PANEL
CHOL/HDL RATIO: 2.2 ratio
CHOLESTEROL: 104 mg/dL (ref 0–200)
HDL: 48 mg/dL (ref >40)
LDL CALC: 42 mg/dL (ref 0–99)
Triglycerides: 71 mg/dL (ref <150)
VLDL: 14 mg/dL (ref 0–40)

## 2015-10-15 LAB — URIC ACID: URIC ACID, SERUM: 4.1 mg/dL (ref 2.3–6.6)

## 2015-10-15 MED ORDER — MECLIZINE HCL 12.5 MG PO TABS: 12.5000 mg | ORAL_TABLET | Freq: Two times a day (BID) | ORAL | Status: DC

## 2015-10-15 MED ORDER — TRAMADOL HCL 50 MG PO TABS
50.0000 mg | ORAL_TABLET | Freq: Once | ORAL | Status: AC
  Administered 2015-10-15: 50 mg via ORAL
  Filled 2015-10-15: qty 1

## 2015-10-15 MED ORDER — PREDNISONE 10 MG PO TABS: ORAL_TABLET | ORAL | Status: DC

## 2015-10-15 MED ORDER — PREDNISONE 20 MG PO TABS: 30.0000 mg | ORAL_TABLET | Freq: Every day | ORAL | Status: DC

## 2015-10-15 MED ORDER — LEVOFLOXACIN 500 MG PO TABS: 500.0000 mg | ORAL_TABLET | Freq: Every day | ORAL | Status: DC

## 2015-10-15 MED ORDER — POLYETHYLENE GLYCOL 3350 17 G PO PACK: 17.0000 g | PACK | Freq: Every day | ORAL | Status: DC

## 2015-10-15 MED ORDER — GUAIFENESIN ER 600 MG PO TB12: 600.0000 mg | ORAL_TABLET | Freq: Two times a day (BID) | ORAL | Status: DC

## 2015-10-15 NOTE — Progress Notes (Signed)
Pt still continues to complain of headache overnight, no complaints of dizziness. Pt given tylenol for pain

## 2015-10-15 NOTE — Discharge Summary (Addendum)
Physician Discharge Summary  Patient ID: [name] MRN: [id number] DOB/AGE: March 19, 1946 69 y.o.  Admit date: 10/11/2015 Discharge date: 10/15/2015  Admission Diagnoses: Acute exacerbation of COPD Anxiety Cervical disc disease DM, II Hypertension Obesity Headache  Discharge Diagnoses:  Principal Problem:   Acute exacerbation of chronic obstructive pulmonary disease (COPD) ([place]) Active Problems:   Hypertension   Cervical disc disease   Headache   DM II (diabetes mellitus, type II), controlled ([place])   Ethmoid sinusitis   Obesity   CKD, III  Discharged Condition: fair  Hospital Course: 69 y.o. female who HTN, type II DM presents to the Emergency Department complaining of constant, gradual worsening productive cough onset two weeks ago. Pt has been having associated post nasal drip, sore throat and reports she had bladder incontinence due to severity of cough. Pt reports she has been having sinus trouble for the past two weeks and received a z-pack and nasonex with no relief. Pt states she used some listerine and it seemed to temporarily get rid of the cough. Pt is a smoker and reports smoking at least 5 or 6 cigarettes a day for 1 year and 1 pack of cigarettes for 50 years. Her B-blocker was held. She had significant improvement with IV antibiotics and solumedrol use. She will be on oral antibiotics and PO prednisone with slow taper in dose. Her CT scan for headache showed Ethmoid sinusitis only. She claims to have quit smoking after a long time use. She will be followed by me in 1 week.  Consults: cardiology  Significant Diagnostic Studies: labs: Normal CBC, BMET except sugar of 138 mg. Normal lipid panel and uric acid level.  Chest X-ray: Unremarkable.  CT head w/o contrast: Unremarkable except empty sella and bilateral ethmoid sinusitis.  Treatments: cardiac meds: amlodipine, clonidine, potassium and Losartan. Respiratory meds: Albuterol inhaler, Levaquin, Tussionex and  prednisone.  Discharge Exam: Blood pressure 138/48, pulse 65, temperature 98.5 F (36.9 C), temperature source Oral, resp. rate 20, height '5\' 7"'$  (1.702 m), weight 99.61 kg (219 lb 9.6 oz), SpO2 96 %. HEENT: [place]/AT, Eyes-Brown, PERL, EOMI, Conjunctiva-Pink, Sclera-Non-icteric Neck: No JVD, No bruit, Trachea midline. Lungs: Forced expiratory wheeze, Bilateral. Cardiac: Regular rhythm, normal S1 and S2, no S3. II/VI systolic murmur. Abdomen: Soft, non-tender. Extremities: No edema present. No cyanosis. No clubbing. CNS: AxOx3, Cranial nerves grossly intact, moves all 4 extremities. Right handed. Skin: Warm and dry.  Disposition: 01-Home or Self Care     Medication List    STOP taking these medications        hydrALAZINE 50 MG tablet  Commonly known as:  APRESOLINE     metoprolol 50 MG tablet  Commonly known as:  LOPRESSOR      TAKE these medications        ALPRAZolam 0.25 MG tablet  Commonly known as:  XANAX  Take 0.25 mg by mouth 2 (two) times daily as needed for anxiety or sleep.     amLODipine 5 MG tablet  Commonly known as:  NORVASC  Take 5 mg by mouth 2 (two) times daily.     chlorpheniramine-HYDROcodone 10-8 MG/5ML Suer  Commonly known as:  TUSSIONEX PENNKINETIC ER  Take 5 mLs by mouth every 12 (twelve) hours as needed for cough.     cloNIDine 0.1 MG tablet  Commonly known as:  CATAPRES  Take 0.05 mg by mouth at bedtime.     esomeprazole 40 MG capsule  Commonly known as:  NEXIUM  Take 40 mg by mouth daily  as needed (for heartburn).     guaiFENesin 600 MG 12 hr tablet  Commonly known as:  MUCINEX  Take 1 tablet (600 mg total) by mouth 2 (two) times daily.     losartan 100 MG tablet  Commonly known as:  COZAAR  Take 100 mg by mouth daily.     meclizine 12.5 MG tablet  Commonly known as:  ANTIVERT  Take 1 tablet (12.5 mg total) by mouth 2 (two) times daily.     metFORMIN 500 MG tablet  Commonly known as:  GLUCOPHAGE  Take 500 mg by mouth 2 (two)  times daily.     NITROSTAT 0.4 MG SL tablet  Generic drug:  nitroGLYCERIN  PLACE 1 TABLET UNDER TONGUE IF NEEDED FOR CHEST PAIN EVERY 5 MINUTES FOR MAX OF 3 DOSES     polyethylene glycol packet  Commonly known as:  MIRALAX / GLYCOLAX  Take 17 g by mouth daily.     potassium chloride 10 MEQ tablet  Commonly known as:  K-DUR  Take 20 mEq by mouth daily.     predniSONE 10 MG tablet  Commonly known as:  DELTASONE  3 daily x 2 days then 2 daily x 3 days, then 1 daily x 6 days, then 1/2 daily x 12 days     PROAIR HFA 108 (90 BASE) MCG/ACT inhaler  Generic drug:  albuterol  INHALE 2 PUFFS 4 TIMES A DAY AS NEEDED shortness of breath     rosuvastatin 10 MG tablet  Commonly known as:  CRESTOR  Take 10 mg by mouth daily.     sodium chloride 0.65 % Soln nasal spray  Commonly known as:  OCEAN  Place 1 spray into both nostrils as needed for congestion.          Levaquin 500 mg. one daily.     Follow-up Information    Follow up with [facility], MD. Schedule an appointment as soon as possible for a visit on 10/23/2015.   Specialty:  Cardiology   Why:  @ 11: 00 am .. confirmed w/ Sherren Mocha information:   [place] [address] [phone]       Signed: Birdie Riddle 10/15/2015, 8:59 AM

## 2015-10-15 NOTE — Progress Notes (Signed)
Pt c/o HA.  MD notified and tramadol ordered.  Tramadol took pain away and MD notified for prescription for pt at DC.  Will continue to monitor.

## 2015-10-16 LAB — GLUCOSE, CAPILLARY: Glucose-Capillary: 174 mg/dL — ABNORMAL HIGH (ref 65–99)

## 2015-11-25 ENCOUNTER — Encounter (HOSPITAL_COMMUNITY): Payer: Self-pay

## 2015-11-25 ENCOUNTER — Emergency Department (HOSPITAL_COMMUNITY): Payer: Medicare HMO

## 2015-11-25 ENCOUNTER — Emergency Department (HOSPITAL_COMMUNITY)
Admission: EM | Admit: 2015-11-25 | Discharge: 2015-11-25 | Disposition: A | Discharge status: Home / Self Care | Payer: Medicare HMO | Attending: Emergency Medicine | Admitting: Emergency Medicine

## 2015-11-25 DIAGNOSIS — M1990 Unspecified osteoarthritis, unspecified site: Secondary | ICD-10-CM | POA: Diagnosis not present

## 2015-11-25 DIAGNOSIS — I10 Essential (primary) hypertension: Secondary | ICD-10-CM | POA: Insufficient documentation

## 2015-11-25 DIAGNOSIS — F17210 Nicotine dependence, cigarettes, uncomplicated: Secondary | ICD-10-CM | POA: Insufficient documentation

## 2015-11-25 DIAGNOSIS — E119 Type 2 diabetes mellitus without complications: Secondary | ICD-10-CM | POA: Diagnosis not present

## 2015-11-25 DIAGNOSIS — R002 Palpitations: Secondary | ICD-10-CM | POA: Diagnosis present

## 2015-11-25 DIAGNOSIS — Z79899 Other long term (current) drug therapy: Secondary | ICD-10-CM | POA: Insufficient documentation

## 2015-11-25 DIAGNOSIS — K219 Gastro-esophageal reflux disease without esophagitis: Secondary | ICD-10-CM | POA: Insufficient documentation

## 2015-11-25 DIAGNOSIS — G8929 Other chronic pain: Secondary | ICD-10-CM | POA: Diagnosis not present

## 2015-11-25 DIAGNOSIS — R0602 Shortness of breath: Secondary | ICD-10-CM | POA: Insufficient documentation

## 2015-11-25 LAB — CBC WITH DIFFERENTIAL/PLATELET
BASOS ABS: 0.1 10*3/uL (ref 0.0–0.1)
BASOS PCT: 1 %
EOS ABS: 0.5 10*3/uL (ref 0.0–0.7)
EOS PCT: 4 %
HCT: 39.8 % (ref 36.0–46.0)
Hemoglobin: 13.4 g/dL (ref 12.0–15.0)
Lymphocytes Relative: 32 %
Lymphs Abs: 3.4 10*3/uL (ref 0.7–4.0)
MCH: 30.1 pg (ref 26.0–34.0)
MCHC: 33.7 g/dL (ref 30.0–36.0)
MCV: 89.4 fL (ref 78.0–100.0)
Monocytes Absolute: 0.6 10*3/uL (ref 0.1–1.0)
Monocytes Relative: 6 %
NEUTROS PCT: 57 %
Neutro Abs: 6 10*3/uL (ref 1.7–7.7)
PLATELETS: 303 10*3/uL (ref 150–400)
RBC: 4.45 MIL/uL (ref 3.87–5.11)
RDW: 12.6 % (ref 11.5–15.5)
WBC: 10.5 10*3/uL (ref 4.0–10.5)

## 2015-11-25 LAB — COMPREHENSIVE METABOLIC PANEL
ALBUMIN: 3.6 g/dL (ref 3.5–5.0)
ALT: 10 U/L — AB (ref 14–54)
AST: 26 U/L (ref 15–41)
Alkaline Phosphatase: 97 U/L (ref 38–126)
Anion gap: 12 (ref 5–15)
BUN: 14 mg/dL (ref 6–20)
CHLORIDE: 106 mmol/L (ref 101–111)
CO2: 19 mmol/L — AB (ref 22–32)
CREATININE: 1.06 mg/dL — AB (ref 0.44–1.00)
Calcium: 9 mg/dL (ref 8.9–10.3)
GFR calc non Af Amer: 53 mL/min — ABNORMAL LOW (ref >60)
GLUCOSE: 138 mg/dL — AB (ref 65–99)
Potassium: 5.3 mmol/L — ABNORMAL HIGH (ref 3.5–5.1)
SODIUM: 137 mmol/L (ref 135–145)
Total Bilirubin: 0.9 mg/dL (ref 0.3–1.2)
Total Protein: 6.7 g/dL (ref 6.5–8.1)

## 2015-11-25 MED ORDER — SODIUM CHLORIDE 0.9 % IV BOLUS (SEPSIS)
1000.0000 mL | Freq: Once | INTRAVENOUS | Status: AC
  Administered 2015-11-25: 1000 mL via INTRAVENOUS

## 2015-11-25 NOTE — ED Provider Notes (Signed)
CSN: [id number]     Arrival date & time 11/25/15  1726 History   First MD Initiated Contact with Patient 11/25/15 2014     Chief Complaint  Patient presents with  . Shortness of Breath  . Palpitations     (Consider location/radiation/quality/duration/timing/severity/associated sxs/prior Treatment) Patient is a 70 y.o. female presenting with palpitations.  Palpitations Palpitations quality:  Regular Onset quality:  Sudden Timing:  Constant Chronicity:  New Context: not anxiety, not blood loss and not dehydration   Relieved by:  None tried Worsened by:  Nothing Ineffective treatments:  None tried Associated symptoms: no back pain, no chest pain, no cough, no nausea and no shortness of breath   Risk factors: no diabetes mellitus and no hx of thyroid disease     Past Medical History  Diagnosis Date  . Diabetes mellitus   . Hypertension   . GERD (gastroesophageal reflux disease)   . Herniated disc   . Arthritis   . Shortness of breath on exertion 11/10/11    "sometimes"  . H/O hiatal hernia   . Headache(784.0)   . Hemorrhoids   . Chronic back pain greater than 3 months duration   . Vertigo   . Chronic neck pain   . Hx of echocardiogram 2010    normal EF   Past Surgical History  Procedure Laterality Date  . Knee surgery      right  . Shoulder open rotator cuff repair  07/20/2011    left  . Fracture surgery  2004?    right shoulder  . Knee arthroscopy  2004    right  . Tonsillectomy    . Cholecystectomy  ~ 2008  . Abdominal hysterectomy  1997?    "partial"  . Neck fusion     No family history on file. Social History  Substance Use Topics  . Smoking status: Current Some Day Smoker -- 0.20 packs/day for 40 years    Types: Cigarettes  . Smokeless tobacco: Never Used     Comment: smoking cessation consult entered  . Alcohol Use: No   OB History    No data available     Review of Systems  Constitutional: Negative for fever, chills and fatigue.  Eyes:  Negative for pain and redness.  Respiratory: Negative for cough and shortness of breath.   Cardiovascular: Positive for palpitations. Negative for chest pain.  Gastrointestinal: Negative for nausea.  Musculoskeletal: Negative for back pain.  All other systems reviewed and are negative.     Allergies  Oxycodone  Home Medications   Prior to Admission medications   Medication Sig Start Date End Date Taking? Authorizing Provider  albuterol (PROVENTIL) (2.5 MG/3ML) 0.083% nebulizer solution USE 1 VIAL EVERY 6 HOURS AS NEEDED FOR SHORTNESS OF BREATH 10/15/15  Yes Historical Provider, MD  ALPRAZolam (XANAX) 0.25 MG tablet Take 0.25 mg by mouth 2 (two) times daily as needed for anxiety or sleep.  10/16/14  Yes Historical Provider, MD  amLODipine (NORVASC) 5 MG tablet Take 5 mg by mouth 2 (two) times daily.    Yes Historical Provider, MD  cloNIDine (CATAPRES) 0.1 MG tablet Take 0.05 mg by mouth at bedtime.   Yes Historical Provider, MD  cyclobenzaprine (FLEXERIL) 5 MG tablet Take 5 mg by mouth at bedtime. 10/29/15  Yes Historical Provider, MD  esomeprazole (NEXIUM) 40 MG capsule Take 40 mg by mouth daily as needed (for heartburn).    Yes Historical Provider, MD  guaiFENesin (MUCINEX) 600 MG 12 hr tablet Take 1  tablet (600 mg total) by mouth 2 (two) times daily. Patient taking differently: Take 600 mg by mouth 2 (two) times daily as needed for cough or to loosen phlegm.  10/15/15  Yes Dixie Dials, MD  hydrALAZINE (APRESOLINE) 50 MG tablet Take 50 mg by mouth every 8 (eight) hours. 11/14/15  Yes Historical Provider, MD  losartan (COZAAR) 100 MG tablet Take 100 mg by mouth daily. 09/20/14  Yes Historical Provider, MD  meclizine (ANTIVERT) 12.5 MG tablet Take 1 tablet (12.5 mg total) by mouth 2 (two) times daily. Patient taking differently: Take 12.5 mg by mouth 2 (two) times daily as needed for dizziness or nausea.  10/15/15  Yes Dixie Dials, MD  metFORMIN (GLUCOPHAGE) 500 MG tablet Take 500 mg by  mouth 2 (two) times daily. 01/06/14  Yes Historical Provider, MD  NITROSTAT 0.4 MG SL tablet PLACE 1 TABLET UNDER TONGUE IF NEEDED FOR CHEST PAIN EVERY 5 MINUTES FOR MAX OF 3 DOSES 09/23/15  Yes Historical Provider, MD  polyethylene glycol (MIRALAX / GLYCOLAX) packet Take 17 g by mouth daily. Patient taking differently: Take 17 g by mouth daily as needed for mild constipation or moderate constipation.  10/15/15  Yes Dixie Dials, MD  potassium chloride (K-DUR) 10 MEQ tablet Take 10 mEq by mouth 2 (two) times daily.    Yes Historical Provider, MD  PROAIR HFA 108 (90 BASE) MCG/ACT inhaler INHALE 2 PUFFS EVERY 4 TO 6 HOURS AS NEEDED FOR SHORTNESS OF BREATH 09/23/15  Yes Historical Provider, MD  rosuvastatin (CRESTOR) 10 MG tablet Take 10 mg by mouth 2 (two) times a week.  09/23/15  Yes Historical Provider, MD  sodium chloride (OCEAN) 0.65 % SOLN nasal spray Place 1 spray into both nostrils as needed for congestion. 10/06/15  Yes JAARS, NP  traMADol (ULTRAM) 50 MG tablet Take 50 mg by mouth 2 (two) times daily as needed for moderate pain or severe pain.  10/15/15  Yes Historical Provider, MD  chlorpheniramine-HYDROcodone (TUSSIONEX PENNKINETIC ER) 10-8 MG/5ML SUER Take 5 mLs by mouth every 12 (twelve) hours as needed for cough. Patient not taking: Reported on 11/25/2015 10/06/15   Ashley Murrain, NP  levofloxacin (LEVAQUIN) 500 MG tablet Take 1 tablet (500 mg total) by mouth daily. Patient not taking: Reported on 11/25/2015 10/16/15   Dixie Dials, MD  predniSONE (DELTASONE) 10 MG tablet 3 daily x 2 days then 2 daily x 3 days, then 1 daily x 6 days, then 1/2 daily x 12 days Patient not taking: Reported on 11/25/2015 10/15/15   Dixie Dials, MD   BP 110/61 mmHg  Pulse 70  Temp(Src) 98 F (36.7 C) (Oral)  Resp 24  Ht '5\' 7"'$  (1.702 m)  Wt 219 lb (99.338 kg)  BMI 34.29 kg/m2  SpO2 99% Physical Exam  Constitutional: She is oriented to person, place, and time. She appears well-developed and  well-nourished.  HENT:  Head: Normocephalic and atraumatic.  Neck: Normal range of motion.  Cardiovascular: Normal rate, regular rhythm and normal heart sounds.   Pulmonary/Chest: Effort normal. No stridor. No respiratory distress. She has no wheezes. She has no rales.  Abdominal: She exhibits no distension.  Musculoskeletal: Normal range of motion. She exhibits no edema or tenderness.  Neurological: She is alert and oriented to person, place, and time.  Skin: Skin is warm and dry.  Psychiatric: She has a normal mood and affect.  Nursing note and vitals reviewed.   ED Course  Procedures (including critical care time) Labs Review  Labs Reviewed  COMPREHENSIVE METABOLIC PANEL - Abnormal; Notable for the following:    Potassium 5.3 (*)    CO2 19 (*)    Glucose, Bld 138 (*)    Creatinine, Ser 1.06 (*)    ALT 10 (*)    GFR calc non Af Amer 53 (*)    All other components within normal limits  CBC WITH DIFFERENTIAL/PLATELET  TSH    Imaging Review Dg Chest 2 View  11/25/2015  CLINICAL DATA:  Shortness of breath today. History of diabetes, hypertension, smoking, recent bronchitis. EXAM: CHEST  2 VIEW COMPARISON:  10/11/2015 FINDINGS: Heart size is normal. There are no focal consolidations or pleural effusions. Mild perihilar bronchitic thickening is noted. Surgical clips are present in the upper abdomen. IMPRESSION: 1. Bronchitic changes. 2.  No focal acute pulmonary abnormality. Electronically Signed   By: Nolon Nations M.D.   On: 11/25/2015 19:08   I have personally reviewed and evaluated these images and lab results as part of my medical decision-making.   EKG Interpretation   Date/Time:  Monday November 25 2015 17:43:13 EST Ventricular Rate:  81 PR Interval:  178 QRS Duration: 86 QT Interval:  384 QTC Calculation: 446 R Axis:   -19 Text Interpretation:  Poor data quality, interpretation [place] be  adversely affected Sinus rhythm with marked sinus arrhythmia Septal  infarct ,  age undetermined Abnormal ECG No significant change since last  tracing Confirmed by [facility] MD, Corene Cornea [phone]) on 11/25/2015 8:33:34 PM      MDM   Final diagnoses:  Palpitations   70 year old female here with the episode of palpitations earlier today since he was shortness of breath and a feeling she was going to die. No history of same. Examination here patient is asymptomatic. Normal blood pressure. No chest pain lower extremity swelling. Patient asked if it can be related to anxiety and she felt like it was a panic attack 2 at bedtime that it was possible however sodium to evaluate for any other causes of arrhythmias. EKG chest x-ray are within normal limits. She had no electrolyte abnormalities and her TSH is within normal limits as well. It is possible that this was related to anxiety however she need follow-up with her primary doctor need Holter monitor for further evaluation.    Merrily Pew, MD 11/25/15 [phone]

## 2015-11-25 NOTE — ED Notes (Signed)
Patient here with shortness of breath and complaining that she feels as if her heart is racing since this afternoon. No distress on arrival, speaking full sentences

## 2016-01-05 ENCOUNTER — Encounter (HOSPITAL_COMMUNITY): Payer: Self-pay

## 2016-01-05 ENCOUNTER — Emergency Department (HOSPITAL_COMMUNITY): Payer: Medicare HMO

## 2016-01-05 ENCOUNTER — Emergency Department (HOSPITAL_COMMUNITY)
Admission: EM | Admit: 2016-01-05 | Discharge: 2016-01-05 | Disposition: A | Discharge status: Home / Self Care | Payer: Medicare HMO | Attending: Emergency Medicine | Admitting: Emergency Medicine

## 2016-01-05 DIAGNOSIS — E119 Type 2 diabetes mellitus without complications: Secondary | ICD-10-CM | POA: Insufficient documentation

## 2016-01-05 DIAGNOSIS — R197 Diarrhea, unspecified: Secondary | ICD-10-CM | POA: Diagnosis not present

## 2016-01-05 DIAGNOSIS — Z8739 Personal history of other diseases of the musculoskeletal system and connective tissue: Secondary | ICD-10-CM | POA: Insufficient documentation

## 2016-01-05 DIAGNOSIS — E876 Hypokalemia: Secondary | ICD-10-CM | POA: Diagnosis not present

## 2016-01-05 DIAGNOSIS — K219 Gastro-esophageal reflux disease without esophagitis: Secondary | ICD-10-CM | POA: Diagnosis not present

## 2016-01-05 DIAGNOSIS — Z7984 Long term (current) use of oral hypoglycemic drugs: Secondary | ICD-10-CM | POA: Insufficient documentation

## 2016-01-05 DIAGNOSIS — R202 Paresthesia of skin: Secondary | ICD-10-CM

## 2016-01-05 DIAGNOSIS — I10 Essential (primary) hypertension: Secondary | ICD-10-CM

## 2016-01-05 DIAGNOSIS — R062 Wheezing: Secondary | ICD-10-CM | POA: Insufficient documentation

## 2016-01-05 DIAGNOSIS — Z79899 Other long term (current) drug therapy: Secondary | ICD-10-CM | POA: Insufficient documentation

## 2016-01-05 DIAGNOSIS — R0602 Shortness of breath: Secondary | ICD-10-CM | POA: Insufficient documentation

## 2016-01-05 DIAGNOSIS — R05 Cough: Secondary | ICD-10-CM | POA: Diagnosis not present

## 2016-01-05 DIAGNOSIS — F17210 Nicotine dependence, cigarettes, uncomplicated: Secondary | ICD-10-CM | POA: Diagnosis not present

## 2016-01-05 DIAGNOSIS — R300 Dysuria: Secondary | ICD-10-CM | POA: Insufficient documentation

## 2016-01-05 DIAGNOSIS — G8929 Other chronic pain: Secondary | ICD-10-CM | POA: Diagnosis not present

## 2016-01-05 LAB — URINALYSIS, ROUTINE W REFLEX MICROSCOPIC
BILIRUBIN URINE: NEGATIVE
Glucose, UA: NEGATIVE mg/dL
Ketones, ur: NEGATIVE mg/dL
Leukocytes, UA: NEGATIVE
NITRITE: NEGATIVE
PROTEIN: 30 mg/dL — AB
SPECIFIC GRAVITY, URINE: 1.006 (ref 1.005–1.030)
pH: 5.5 (ref 5.0–8.0)

## 2016-01-05 LAB — BASIC METABOLIC PANEL
Anion gap: 12 (ref 5–15)
BUN: 9 mg/dL (ref 6–20)
CHLORIDE: 104 mmol/L (ref 101–111)
CO2: 24 mmol/L (ref 22–32)
CREATININE: 1.06 mg/dL — AB (ref 0.44–1.00)
Calcium: 9.3 mg/dL (ref 8.9–10.3)
GFR calc non Af Amer: 53 mL/min — ABNORMAL LOW (ref >60)
Glucose, Bld: 141 mg/dL — ABNORMAL HIGH (ref 65–99)
POTASSIUM: 3.3 mmol/L — AB (ref 3.5–5.1)
SODIUM: 140 mmol/L (ref 135–145)

## 2016-01-05 LAB — URINE MICROSCOPIC-ADD ON

## 2016-01-05 LAB — CBC
HEMATOCRIT: 38.6 % (ref 36.0–46.0)
Hemoglobin: 12.8 g/dL (ref 12.0–15.0)
MCH: 29.4 pg (ref 26.0–34.0)
MCHC: 33.2 g/dL (ref 30.0–36.0)
MCV: 88.7 fL (ref 78.0–100.0)
PLATELETS: 362 10*3/uL (ref 150–400)
RBC: 4.35 MIL/uL (ref 3.87–5.11)
RDW: 12.8 % (ref 11.5–15.5)
WBC: 10.9 10*3/uL — AB (ref 4.0–10.5)

## 2016-01-05 LAB — I-STAT TROPONIN, ED: Troponin i, poc: 0.01 ng/mL (ref 0.00–0.08)

## 2016-01-05 MED ORDER — POTASSIUM CHLORIDE CRYS ER 20 MEQ PO TBCR
40.0000 meq | EXTENDED_RELEASE_TABLET | Freq: Once | ORAL | Status: AC
  Administered 2016-01-05: 40 meq via ORAL
  Filled 2016-01-05: qty 2

## 2016-01-05 NOTE — ED Provider Notes (Signed)
CSN: [id number]     Arrival date & time 01/05/16  1740 History  By signing my name below, I, [name], attest that this documentation has been prepared under the direction and in the presence of Kirsten Gambler, MD Electronically Signed: Evonnie White, ED Scribe 01/05/2016 at 12:00 AM.    Chief Complaint  Patient presents with  . Hypertension  . Shortness of Breath  . Diarrhea   The history is provided by the patient. No language interpreter was used.   HPI Comments: [name] is a 70 y.o. female with a hx of bronchitis, who presents to the Emergency Department complaining of elevated HR and BP of 180, diarrhea, SOB, increased heart rate, dysuria, b/l tingling in the jaw and eyes, legs and bottom of the feet, gradual onset, constant, lower abdominal pain, and headaches. Pt states that the diarrhea and associated dysuria began yesterday, and has continued to worsen since then. Pt has had 6 diarrheal episodes today. Pt also c/o an increased heart rate with associated SOB which began to worsen around 4pm today. Pt admits to experiencing similar increased heart rate in the past. Pt reports that SOB, and associated coughing and wheezing, which increased today, and is exacerbated by an increased heart rate, has persisted since November due to bronchitis. Pt was prescribed 2 antibiotics and 3 injections, and cough syrup for bronchitis. Baseline BP for the pt is 938 diastolic. Pt also c/o swelling in the b/l hands and ankles. She admits to having a "weird" feeling when sitting idle. Pt admits to taking all medications properly, and denies any changes in medication. Pt denies CP, weakness, or fever. Pt also denies any hx of heart failure.    Past Medical History  Diagnosis Date  . Diabetes mellitus   . Hypertension   . GERD (gastroesophageal reflux disease)   . Herniated disc   . Arthritis   . Shortness of breath on exertion 11/10/11    "sometimes"  . H/O hiatal hernia   .  Headache(784.0)   . Hemorrhoids   . Chronic back pain greater than 3 months duration   . Vertigo   . Chronic neck pain   . Hx of echocardiogram 2010    normal EF   Past Surgical History  Procedure Laterality Date  . Knee surgery      right  . Shoulder open rotator cuff repair  07/20/2011    left  . Fracture surgery  2004?    right shoulder  . Knee arthroscopy  2004    right  . Tonsillectomy    . Cholecystectomy  ~ 2008  . Abdominal hysterectomy  1997?    "partial"  . Neck fusion     History reviewed. No pertinent family history. Social History  Substance Use Topics  . Smoking status: Current Some Day Smoker -- 0.20 packs/day for 40 years    Types: Cigarettes  . Smokeless tobacco: Never Used     Comment: smoking cessation consult entered  . Alcohol Use: No   OB History    No data available     Review of Systems  Respiratory: Positive for cough, shortness of breath and wheezing.   Cardiovascular: Positive for palpitations. Negative for chest pain.  Gastrointestinal: Positive for diarrhea.  Genitourinary: Positive for dysuria.  All other systems reviewed and are negative.  Allergies  Oxycodone  Home Medications   Prior to Admission medications   Medication Sig Start Date End Date Taking? Authorizing Provider  albuterol (PROVENTIL) (2.5 MG/3ML)  0.083% nebulizer solution USE 1 VIAL EVERY 6 HOURS AS NEEDED FOR SHORTNESS OF BREATH 10/15/15   Historical Provider, MD  ALPRAZolam Duanne Moron) 0.25 MG tablet Take 0.25 mg by mouth 2 (two) times daily as needed for anxiety or sleep.  10/16/14   Historical Provider, MD  amLODipine (NORVASC) 5 MG tablet Take 5 mg by mouth 2 (two) times daily.     Historical Provider, MD  chlorpheniramine-HYDROcodone (TUSSIONEX PENNKINETIC ER) 10-8 MG/5ML SUER Take 5 mLs by mouth every 12 (twelve) hours as needed for cough. Patient not taking: Reported on 11/25/2015 10/06/15   Ashley Murrain, NP  cloNIDine (CATAPRES) 0.1 MG tablet Take 0.05 mg by mouth  at bedtime.    Historical Provider, MD  cyclobenzaprine (FLEXERIL) 5 MG tablet Take 5 mg by mouth at bedtime. 10/29/15   Historical Provider, MD  esomeprazole (NEXIUM) 40 MG capsule Take 40 mg by mouth daily as needed (for heartburn).     Historical Provider, MD  guaiFENesin (MUCINEX) 600 MG 12 hr tablet Take 1 tablet (600 mg total) by mouth 2 (two) times daily. Patient taking differently: Take 600 mg by mouth 2 (two) times daily as needed for cough or to loosen phlegm.  10/15/15   Dixie Dials, MD  hydrALAZINE (APRESOLINE) 50 MG tablet Take 50 mg by mouth every 8 (eight) hours. 11/14/15   Historical Provider, MD  levofloxacin (LEVAQUIN) 500 MG tablet Take 1 tablet (500 mg total) by mouth daily. Patient not taking: Reported on 11/25/2015 10/16/15   Dixie Dials, MD  losartan (COZAAR) 100 MG tablet Take 100 mg by mouth daily. 09/20/14   Historical Provider, MD  meclizine (ANTIVERT) 12.5 MG tablet Take 1 tablet (12.5 mg total) by mouth 2 (two) times daily. Patient taking differently: Take 12.5 mg by mouth 2 (two) times daily as needed for dizziness or nausea.  10/15/15   Dixie Dials, MD  metFORMIN (GLUCOPHAGE) 500 MG tablet Take 500 mg by mouth 2 (two) times daily. 01/06/14   Historical Provider, MD  NITROSTAT 0.4 MG SL tablet PLACE 1 TABLET UNDER TONGUE IF NEEDED FOR CHEST PAIN EVERY 5 MINUTES FOR MAX OF 3 DOSES 09/23/15   Historical Provider, MD  polyethylene glycol (MIRALAX / GLYCOLAX) packet Take 17 g by mouth daily. Patient taking differently: Take 17 g by mouth daily as needed for mild constipation or moderate constipation.  10/15/15   Dixie Dials, MD  potassium chloride (K-DUR) 10 MEQ tablet Take 10 mEq by mouth 2 (two) times daily.     Historical Provider, MD  predniSONE (DELTASONE) 10 MG tablet 3 daily x 2 days then 2 daily x 3 days, then 1 daily x 6 days, then 1/2 daily x 12 days Patient not taking: Reported on 11/25/2015 10/15/15   Dixie Dials, MD  PROAIR HFA 108 (90 BASE) MCG/ACT inhaler  INHALE 2 PUFFS EVERY 4 TO 6 HOURS AS NEEDED FOR SHORTNESS OF BREATH 09/23/15   Historical Provider, MD  rosuvastatin (CRESTOR) 10 MG tablet Take 10 mg by mouth 2 (two) times a week.  09/23/15   Historical Provider, MD  sodium chloride (OCEAN) 0.65 % SOLN nasal spray Place 1 spray into both nostrils as needed for congestion. 10/06/15   Andrew, NP  traMADol (ULTRAM) 50 MG tablet Take 50 mg by mouth 2 (two) times daily as needed for moderate pain or severe pain.  10/15/15   Historical Provider, MD   BP 146/74 mmHg  Pulse 76  Temp(Src) 98 F (36.7 C) (Oral)  Resp  17  SpO2 100% Physical Exam  Constitutional: She is oriented to person, place, and time. She appears well-developed and well-nourished.  HENT:  Head: Normocephalic and atraumatic.  Right Ear: External ear normal.  Left Ear: External ear normal.  Nose: Nose normal.  Eyes: EOM are normal. Pupils are equal, round, and reactive to light. Right eye exhibits no discharge. Left eye exhibits no discharge.  Cardiovascular: Normal rate, regular rhythm and normal heart sounds.   Pulmonary/Chest: Effort normal and breath sounds normal.  Abdominal: Soft. There is no tenderness.  Musculoskeletal: She exhibits no edema.  Normal sensation in the b/l lower extremities    Neurological: She is alert and oriented to person, place, and time.  Cranial nerves 2-12 grossly intact 5/5 strength in all 4 extremities  Grossly normal sensation Normal finger to nose  Skin: Skin is warm and dry.  Nursing note and vitals reviewed.   ED Course  Procedures  DIAGNOSTIC STUDIES: Oxygen Saturation is % on RA, adequate by my interpretation.    COORDINATION OF CARE: 11:11 PM-Discussed treatment plan which includes cardiac monitoring, EKG urinalysis, and blood work  with pt at bedside and pt agreed to plan.   Labs Review Labs Reviewed  BASIC METABOLIC PANEL - Abnormal; Notable for the following:    Potassium 3.3 (*)    Glucose, Bld 141 (*)     Creatinine, Ser 1.06 (*)    GFR calc non Af Amer 53 (*)    All other components within normal limits  CBC - Abnormal; Notable for the following:    WBC 10.9 (*)    All other components within normal limits  URINALYSIS, ROUTINE W REFLEX MICROSCOPIC (NOT AT [facility]) - Abnormal; Notable for the following:    APPearance CLOUDY (*)    Hgb urine dipstick TRACE (*)    Protein, ur 30 (*)    All other components within normal limits  URINE MICROSCOPIC-ADD ON - Abnormal; Notable for the following:    Squamous Epithelial / LPF 6-30 (*)    Bacteria, UA RARE (*)    All other components within normal limits  I-STAT TROPOININ, ED    Imaging Review Dg Chest 2 View  01/05/2016  CLINICAL DATA:  Tachycardia with shortness of breath. Tingling in the face and legs today. EXAM: CHEST  2 VIEW COMPARISON:  11/25/2015 and 10/11/2015. FINDINGS: The heart size and mediastinal contours are stable. Mild chronic central airway thickening appears unchanged. There is no confluent airspace opacity, edema or pleural effusion. Probable mild right apical scarring, similar to priors. The bones appear unchanged. IMPRESSION: Stable chronic central airway thickening. No acute cardiopulmonary process. Electronically Signed   By: Richardean Sale M.D.   On: 01/05/2016 19:58   I have personally reviewed and evaluated these images and lab results as part of my medical decision-making.   EKG Interpretation   Date/Time:  Sunday January 05 2016 18:21:11 EST Ventricular Rate:  88 PR Interval:  168 QRS Duration: 82 QT Interval:  380 QTC Calculation: 459 R Axis:   -53 Text Interpretation:  Normal sinus rhythm Left anterior fascicular block  Septal infarct , age undetermined Abnormal ECG no significant change since  Jan 2017 Confirmed by Regenia Skeeter  MD, Kanani Mowbray [phone]) on 01/05/2016 11:11:29 PM      MDM   Final diagnoses:  Essential hypertension  Diarrhea, unspecified type  Paresthesia  Hypokalemia    Patient with multiple  complaints. Her exam, including neuro exam, is normal. Workup unremarkable except for mild hypokalemia, will replete.  She is not dyspneic now, this seems to correlate with the transient and recurrent palpitations she had much earlier in the evening. BP lower. When discussing findings with patient, she prefers to go home and f/u with PCP tomorrow. Discussed possible abdominal imaging given mild pain (though is most likely from diarrhea) and she declines, stating she feels better and will f/u with PCP.  I personally performed the services described in this documentation, which was scribed in my presence. The recorded information has been reviewed and is accurate.      Kirsten Gambler, MD 01/06/16 [phone]

## 2016-01-05 NOTE — ED Notes (Signed)
Patient left at this time with all belongings. 

## 2016-01-05 NOTE — ED Notes (Addendum)
Pt reports onset yesterday BP elevated, diarrhea x 3 today, dysuria and urinary frequency.  Onset today swelling in bilateral hands, tingling in face and legs, HR feels like it is racing and chest feels "hot".  Pt has been treated for recent bronchitis, f/u with PCP last Thursday.   Pt reports increase in shortness of breath that she has had with the bronchitis symptoms.

## 2016-02-20 ENCOUNTER — Encounter (HOSPITAL_COMMUNITY): Payer: Self-pay

## 2016-02-20 ENCOUNTER — Emergency Department (HOSPITAL_COMMUNITY): Payer: Medicare HMO

## 2016-02-20 ENCOUNTER — Emergency Department (HOSPITAL_COMMUNITY)
Admission: EM | Admit: 2016-02-20 | Discharge: 2016-02-20 | Disposition: A | Discharge status: Home / Self Care | Payer: Medicare HMO | Attending: Emergency Medicine | Admitting: Emergency Medicine

## 2016-02-20 DIAGNOSIS — E119 Type 2 diabetes mellitus without complications: Secondary | ICD-10-CM | POA: Insufficient documentation

## 2016-02-20 DIAGNOSIS — M545 Low back pain, unspecified: Secondary | ICD-10-CM

## 2016-02-20 DIAGNOSIS — M1990 Unspecified osteoarthritis, unspecified site: Secondary | ICD-10-CM | POA: Insufficient documentation

## 2016-02-20 DIAGNOSIS — Z79899 Other long term (current) drug therapy: Secondary | ICD-10-CM | POA: Diagnosis not present

## 2016-02-20 DIAGNOSIS — G8929 Other chronic pain: Secondary | ICD-10-CM | POA: Insufficient documentation

## 2016-02-20 DIAGNOSIS — Z7984 Long term (current) use of oral hypoglycemic drugs: Secondary | ICD-10-CM | POA: Diagnosis not present

## 2016-02-20 DIAGNOSIS — F17210 Nicotine dependence, cigarettes, uncomplicated: Secondary | ICD-10-CM | POA: Insufficient documentation

## 2016-02-20 DIAGNOSIS — I10 Essential (primary) hypertension: Secondary | ICD-10-CM | POA: Insufficient documentation

## 2016-02-20 DIAGNOSIS — N760 Acute vaginitis: Secondary | ICD-10-CM

## 2016-02-20 DIAGNOSIS — K219 Gastro-esophageal reflux disease without esophagitis: Secondary | ICD-10-CM | POA: Diagnosis not present

## 2016-02-20 LAB — BASIC METABOLIC PANEL
ANION GAP: 11 (ref 5–15)
BUN: 13 mg/dL (ref 6–20)
CO2: 24 mmol/L (ref 22–32)
Calcium: 9.3 mg/dL (ref 8.9–10.3)
Chloride: 96 mmol/L — ABNORMAL LOW (ref 101–111)
Creatinine, Ser: 1.21 mg/dL — ABNORMAL HIGH (ref 0.44–1.00)
GFR, EST AFRICAN AMERICAN: 52 mL/min — AB (ref >60)
GFR, EST NON AFRICAN AMERICAN: 45 mL/min — AB (ref >60)
Glucose, Bld: 127 mg/dL — ABNORMAL HIGH (ref 65–99)
POTASSIUM: 3.8 mmol/L (ref 3.5–5.1)
SODIUM: 131 mmol/L — AB (ref 135–145)

## 2016-02-20 LAB — URINALYSIS, ROUTINE W REFLEX MICROSCOPIC
Bilirubin Urine: NEGATIVE
Glucose, UA: NEGATIVE mg/dL
Hgb urine dipstick: NEGATIVE
Ketones, ur: NEGATIVE mg/dL
LEUKOCYTES UA: NEGATIVE
Nitrite: NEGATIVE
PROTEIN: NEGATIVE mg/dL
Specific Gravity, Urine: 1.009 (ref 1.005–1.030)
pH: 5.5 (ref 5.0–8.0)

## 2016-02-20 LAB — CBC
HEMATOCRIT: 37.8 % (ref 36.0–46.0)
HEMOGLOBIN: 12.9 g/dL (ref 12.0–15.0)
MCH: 30.3 pg (ref 26.0–34.0)
MCHC: 34.1 g/dL (ref 30.0–36.0)
MCV: 88.7 fL (ref 78.0–100.0)
Platelets: 368 10*3/uL (ref 150–400)
RBC: 4.26 MIL/uL (ref 3.87–5.11)
RDW: 12.5 % (ref 11.5–15.5)
WBC: 9.5 10*3/uL (ref 4.0–10.5)

## 2016-02-20 MED ORDER — CYCLOBENZAPRINE HCL 10 MG PO TABS: 10.0000 mg | ORAL_TABLET | Freq: Two times a day (BID) | ORAL | Status: DC | PRN

## 2016-02-20 MED ORDER — CYCLOBENZAPRINE HCL 5 MG PO TABS: 5.0000 mg | ORAL_TABLET | Freq: Every evening | ORAL | Status: DC | PRN

## 2016-02-20 MED ORDER — SODIUM CHLORIDE 0.9 % IV BOLUS (SEPSIS)
1000.0000 mL | Freq: Once | INTRAVENOUS | Status: AC
  Administered 2016-02-20: 1000 mL via INTRAVENOUS

## 2016-02-20 MED ORDER — ONDANSETRON HCL 4 MG/2ML IJ SOLN
4.0000 mg | Freq: Once | INTRAMUSCULAR | Status: AC
  Administered 2016-02-20: 4 mg via INTRAVENOUS
  Filled 2016-02-20: qty 2

## 2016-02-20 MED ORDER — KETOCONAZOLE 2 % EX CREA: TOPICAL_CREAM | CUTANEOUS | Status: DC

## 2016-02-20 MED ORDER — HYDROMORPHONE HCL 1 MG/ML IJ SOLN
0.5000 mg | Freq: Once | INTRAMUSCULAR | Status: AC
  Administered 2016-02-20: 0.5 mg via INTRAVENOUS
  Filled 2016-02-20: qty 1

## 2016-02-20 MED ORDER — HYDROCODONE-ACETAMINOPHEN 5-325 MG PO TABS: 2.0000 | ORAL_TABLET | ORAL | Status: DC | PRN

## 2016-02-20 NOTE — ED Notes (Signed)
Patient here with ongoing abdominal pain and pressure for several days. States that she had U/S today to have kidney stone evaluated. Reports that the pain is worse than before

## 2016-02-20 NOTE — ED Notes (Signed)
Abigail, PA at bedside with patient at this time.

## 2016-02-20 NOTE — ED Notes (Signed)
Patient transported to CT 

## 2016-02-20 NOTE — Discharge Instructions (Signed)
Back Injury Prevention Back injuries can be very painful. They can also be difficult to heal. After having one back injury, you are more likely to injure your back again. It is important to learn how to avoid injuring or re-injuring your back. The following tips can help you to prevent a back injury. WHAT SHOULD I KNOW ABOUT PHYSICAL FITNESS?  Exercise for 30 minutes per day on most days of the week or as directed by your health care provider. Make sure to:  Do aerobic exercises, such as walking, jogging, biking, or swimming.  Do exercises that increase balance and strength, such as tai chi and yoga. These can decrease your risk of falling and injuring your back.  Do stretching exercises to help with flexibility.  Try to develop strong abdominal muscles. Your abdominal muscles provide a lot of the support that is needed by your back.  Maintain a healthy weight. This helps to decrease your risk of a back injury. WHAT SHOULD I KNOW ABOUT MY DIET?  Talk with your health care provider about your overall diet. Take supplements and vitamins only as directed by your health care provider.  Talk with your health care provider about how much calcium and vitamin D you need each day. These nutrients help to prevent weakening of the bones (osteoporosis). Osteoporosis can cause broken (fractured) bones, which lead to back pain.  Include good sources of calcium in your diet, such as dairy products, green leafy vegetables, and products that have had calcium added to them (fortified).  Include good sources of vitamin D in your diet, such as milk and foods that are fortified with vitamin D. WHAT SHOULD I KNOW ABOUT MY POSTURE?  Sit up straight and stand up straight. Avoid leaning forward when you sit or hunching over when you stand.  Choose chairs that have good low-back (lumbar) support.  If you work at a desk, sit close to it so you do not need to lean over. Keep your chin tucked in. Keep your neck  drawn back, and keep your elbows bent at a right angle. Your arms should look like the letter "L."  Sit high and close to the steering wheel when you drive. Add a lumbar support to your car seat, if needed.  Avoid sitting or standing in one position for very long. Take breaks to get up, stretch, and walk around at least one time every hour. Take breaks every hour if you are driving for long periods of time.  Sleep on your side with your knees slightly bent, or sleep on your back with a pillow under your knees. Do not lie on the front of your body to sleep. WHAT SHOULD I KNOW ABOUT LIFTING, TWISTING, AND REACHING? Lifting and Heavy Lifting  Avoid heavy lifting, especially repetitive heavy lifting. If you must do heavy lifting:  Stretch before lifting.  Work slowly.  Rest between lifts.  Use a tool such as a cart or a dolly to move objects if one is available.  Make several small trips instead of carrying one heavy load.  Ask for help when you need it, especially when moving big objects.  Follow these steps when lifting:  Stand with your feet shoulder-width apart.  Get as close to the object as you can. Do not try to pick up a heavy object that is far from your body.  Use handles or lifting straps if they are available.  Bend at your knees. Squat down, but keep your heels off the floor.  Keep your shoulders pulled back, your chin tucked in, and your back straight.  Lift the object slowly while you tighten the muscles in your legs, abdomen, and buttocks. Keep the object as close to the center of your body as possible.  Follow these steps when putting down a heavy load:  Stand with your feet shoulder-width apart.  Lower the object slowly while you tighten the muscles in your legs, abdomen, and buttocks. Keep the object as close to the center of your body as possible.  Keep your shoulders pulled back, your chin tucked in, and your back straight.  Bend at your knees. Squat  down, but keep your heels off the floor.  Use handles or lifting straps if they are available. Twisting and Reaching  Avoid lifting heavy objects above your waist.  Do not twist at your waist while you are lifting or carrying a load. If you need to turn, move your feet.  Do not bend over without bending at your knees.  Avoid reaching over your head, across a table, or for an object on a high surface. WHAT ARE SOME OTHER TIPS?  Avoid wet floors and icy ground. Keep sidewalks clear of ice to prevent falls.  Do not sleep on a mattress that is too soft or too hard.  Keep items that are used frequently within easy reach.  Put heavier objects on shelves at waist level, and put lighter objects on lower or higher shelves.  Find ways to decrease your stress, such as exercise, massage, or relaxation techniques. Stress can build up in your muscles. Tense muscles are more vulnerable to injury.  Talk with your health care provider if you feel anxious or depressed. These conditions can make back pain worse.  Wear flat heel shoes with cushioned soles.  Avoid sudden movements.  Use both shoulder straps when carrying a backpack.  Do not use any tobacco products, including cigarettes, chewing tobacco, or electronic cigarettes. If you need help quitting, ask your health care provider.   This information is not intended to replace advice given to you by your health care provider. Make sure you discuss any questions you have with your health care provider.   Document Released: 12/17/2004 Document Revised: 03/26/2015 Document Reviewed: 11/13/2014 Elsevier Interactive Patient Education 2016 [employer]  Back Pain, Adult Back pain is very common in adults.The cause of back pain is rarely dangerous and the pain often gets better over time.The cause of your back pain Cam not be known. Some common causes of back pain include:  Strain of the muscles or ligaments supporting the spine.  Wear and  tear (degeneration) of the spinal disks.  Arthritis.  Direct injury to the back. For many people, back pain [name] return. Since back pain is rarely dangerous, most people can learn to manage this condition on their own. HOME CARE INSTRUCTIONS Watch your back pain for any changes. The following actions [name] help to lessen any discomfort you are feeling:  Remain active. It is stressful on your back to sit or stand in one place for long periods of time. Do not sit, drive, or stand in one place for more than 30 minutes at a time. Take short walks on even surfaces as soon as you are able.Try to increase the length of time you walk each day.  Exercise regularly as directed by your health care provider. Exercise helps your back heal faster. It also helps avoid future injury by keeping your muscles strong and flexible.  Do not  stay in bed.Resting more than 1-2 days can delay your recovery.  Pay attention to your body when you bend and lift. The most comfortable positions are those that put less stress on your recovering back. Always use proper lifting techniques, including:  Bending your knees.  Keeping the load close to your body.  Avoiding twisting.  Find a comfortable position to sleep. Use a firm mattress and lie on your side with your knees slightly bent. If you lie on your back, put a pillow under your knees.  Avoid feeling anxious or stressed.Stress increases muscle tension and can worsen back pain.It is important to recognize when you are anxious or stressed and learn ways to manage it, such as with exercise.  Take medicines only as directed by your health care provider. Over-the-counter medicines to reduce pain and inflammation are often the most helpful.Your health care provider [name] prescribe muscle relaxant drugs.These medicines help dull your pain so you can more quickly return to your normal activities and healthy exercise.  Apply ice to the injured area:  Put ice in a plastic  bag.  Place a towel between your skin and the bag.  Leave the ice on for 20 minutes, 2-3 times a day for the first 2-3 days. After that, ice and heat [name] be alternated to reduce pain and spasms.  Maintain a healthy weight. Excess weight puts extra stress on your back and makes it difficult to maintain good posture. SEEK MEDICAL CARE IF:  You have pain that is not relieved with rest or medicine.  You have increasing pain going down into the legs or buttocks.  You have pain that does not improve in one week.  You have night pain.  You lose weight.  You have a fever or chills. SEEK IMMEDIATE MEDICAL CARE IF:   You develop new bowel or bladder control problems.  You have unusual weakness or numbness in your arms or legs.  You develop nausea or vomiting.  You develop abdominal pain.  You feel faint.   This information is not intended to replace advice given to you by your health care provider. Make sure you discuss any questions you have with your health care provider.   Document Released: 11/09/2005 Document Revised: 11/30/2014 Document Reviewed: 03/13/2014 Elsevier Interactive Patient Education 2016 [employer]. Vaginitis Vaginitis is an inflammation of the vagina. It is most often caused by a change in the normal balance of the bacteria and yeast that live in the vagina. This change in balance causes an overgrowth of certain bacteria or yeast, which causes the inflammation. There are different types of vaginitis, but the most common types are:  Bacterial vaginosis.  Yeast infection (candidiasis).  Trichomoniasis vaginitis. This is a sexually transmitted infection (STI).  Viral vaginitis.  Atrophic vaginitis.  Allergic vaginitis. CAUSES  The cause depends on the type of vaginitis. Vaginitis can be caused by:  Bacteria (bacterial vaginosis).  Yeast (yeast infection).  A parasite (trichomoniasis vaginitis)  A virus (viral vaginitis).  Low hormone levels  (atrophic vaginitis). Low hormone levels can occur during pregnancy, breastfeeding, or after menopause.  Irritants, such as bubble baths, scented tampons, and feminine sprays (allergic vaginitis). Other factors can change the normal balance of the yeast and bacteria that live in the vagina. These include:  Antibiotic medicines.  Poor hygiene.  Diaphragms, vaginal sponges, spermicides, birth control pills, and intrauterine devices (IUD).  Sexual intercourse.  Infection.  Uncontrolled diabetes.  A weakened immune system. SYMPTOMS  Symptoms can vary depending on  the cause of the vaginitis. Common symptoms include:  Abnormal vaginal discharge.  The discharge is white, gray, or yellow with bacterial vaginosis.  The discharge is thick, white, and cheesy with a yeast infection.  The discharge is frothy and yellow or greenish with trichomoniasis.  A bad vaginal odor.  The odor is fishy with bacterial vaginosis.  Vaginal itching, pain, or swelling.  Painful intercourse.  Pain or burning when urinating. Sometimes, there are no symptoms. TREATMENT  Treatment will vary depending on the type of infection.   Bacterial vaginosis and trichomoniasis are often treated with antibiotic creams or pills.  Yeast infections are often treated with antifungal medicines, such as vaginal creams or suppositories.  Viral vaginitis has no cure, but symptoms can be treated with medicines that relieve discomfort. Your sexual partner should be treated as well.  Atrophic vaginitis [name] be treated with an estrogen cream, pill, suppository, or vaginal ring. If vaginal dryness occurs, lubricants and moisturizing creams [name] help. You [name] be told to avoid scented soaps, sprays, or douches.  Allergic vaginitis treatment involves quitting the use of the product that is causing the problem. Vaginal creams can be used to treat the symptoms. HOME CARE INSTRUCTIONS   Take all medicines as directed by your  caregiver.  Keep your genital area clean and dry. Avoid soap and only rinse the area with water.  Avoid douching. It can remove the healthy bacteria in the vagina.  Do not use tampons or have sexual intercourse until your vaginitis has been treated. Use sanitary pads while you have vaginitis.  Wipe from front to back. This avoids the spread of bacteria from the rectum to the vagina.  Let air reach your genital area.  Wear cotton underwear to decrease moisture buildup.  Avoid wearing underwear while you sleep until your vaginitis is gone.  Avoid tight pants and underwear or nylons without a cotton panel.  Take off wet clothing (especially bathing suits) as soon as possible.  Use mild, non-scented products. Avoid using irritants, such as:  Scented feminine sprays.  Fabric softeners.  Scented detergents.  Scented tampons.  Scented soaps or bubble baths.  Practice safe sex and use condoms. Condoms [name] prevent the spread of trichomoniasis and viral vaginitis. SEEK MEDICAL CARE IF:   You have abdominal pain.  You have a fever or persistent symptoms for more than 2-3 days.  You have a fever and your symptoms suddenly get worse.   This information is not intended to replace advice given to you by your health care provider. Make sure you discuss any questions you have with your health care provider.   Document Released: 09/06/2007 Document Revised: 03/26/2015 Document Reviewed: 04/21/2012 Elsevier Interactive Patient Education [employer].

## 2016-02-20 NOTE — ED Provider Notes (Signed)
CSN: [id number]     Arrival date & time 02/20/16  1855 History   First MD Initiated Contact with Patient 02/20/16 2057     Chief Complaint  Patient presents with  . Abdominal Pain     (Consider location/radiation/quality/duration/timing/severity/associated sxs/prior Treatment) HPI  This is a 70 year old female who presents emergency Department with left lower back pain and burning with urination. She states she was seen by her primary care doctor sent for renal ultrasound to rule out kidney stone. Patient states that her pain worsened. She came to the ER. She is unsure of what the results were. She complains of intermittent back pain. She has a history of chronic low back pain. She states that this is "worser" than her regular back pain. She also notes burning in her vaginal area. It is worsened with urination. She denies itching, following her discharge. She declines a pelvic examination. Past Medical History  Diagnosis Date  . Diabetes mellitus   . Hypertension   . GERD (gastroesophageal reflux disease)   . Herniated disc   . Arthritis   . Shortness of breath on exertion 11/10/11    "sometimes"  . H/O hiatal hernia   . Headache(784.0)   . Hemorrhoids   . Chronic back pain greater than 3 months duration   . Vertigo   . Chronic neck pain   . Hx of echocardiogram 2010    normal EF   Past Surgical History  Procedure Laterality Date  . Knee surgery      right  . Shoulder open rotator cuff repair  07/20/2011    left  . Fracture surgery  2004?    right shoulder  . Knee arthroscopy  2004    right  . Tonsillectomy    . Cholecystectomy  ~ 2008  . Abdominal hysterectomy  1997?    "partial"  . Neck fusion     No family history on file. Social History  Substance Use Topics  . Smoking status: Current Some Day Smoker -- 0.20 packs/day for 40 years    Types: Cigarettes  . Smokeless tobacco: Never Used     Comment: smoking cessation consult entered  . Alcohol Use: No   OB  History    No data available     Review of Systems   Ten systems reviewed and are negative for acute change, except as noted in the HPI.   Allergies  Oxycodone  Home Medications   Prior to Admission medications   Medication Sig Start Date End Date Taking? Authorizing Provider  albuterol (PROVENTIL) (2.5 MG/3ML) 0.083% nebulizer solution USE 1 VIAL EVERY 6 HOURS AS NEEDED FOR SHORTNESS OF BREATH 10/15/15   Historical Provider, MD  ALPRAZolam Duanne Moron) 0.25 MG tablet Take 0.25 mg by mouth 2 (two) times daily as needed for anxiety or sleep.  10/16/14   Historical Provider, MD  amLODipine (NORVASC) 5 MG tablet Take 5 mg by mouth 2 (two) times daily.     Historical Provider, MD  chlorpheniramine-HYDROcodone (TUSSIONEX PENNKINETIC ER) 10-8 MG/5ML SUER Take 5 mLs by mouth every 12 (twelve) hours as needed for cough. Patient not taking: Reported on 11/25/2015 10/06/15   Ashley Murrain, NP  cloNIDine (CATAPRES) 0.1 MG tablet Take 0.05 mg by mouth at bedtime.    Historical Provider, MD  cyclobenzaprine (FLEXERIL) 5 MG tablet Take 5 mg by mouth at bedtime. 10/29/15   Historical Provider, MD  esomeprazole (NEXIUM) 40 MG capsule Take 40 mg by mouth daily as needed (for heartburn).  Historical Provider, MD  guaiFENesin (MUCINEX) 600 MG 12 hr tablet Take 1 tablet (600 mg total) by mouth 2 (two) times daily. Patient taking differently: Take 600 mg by mouth 2 (two) times daily as needed for cough or to loosen phlegm.  10/15/15   Dixie Dials, MD  hydrALAZINE (APRESOLINE) 50 MG tablet Take 50 mg by mouth every 8 (eight) hours. 11/14/15   Historical Provider, MD  levofloxacin (LEVAQUIN) 500 MG tablet Take 1 tablet (500 mg total) by mouth daily. Patient not taking: Reported on 11/25/2015 10/16/15   Dixie Dials, MD  losartan (COZAAR) 100 MG tablet Take 100 mg by mouth daily. 09/20/14   Historical Provider, MD  meclizine (ANTIVERT) 12.5 MG tablet Take 1 tablet (12.5 mg total) by mouth 2 (two) times  daily. Patient taking differently: Take 12.5 mg by mouth 2 (two) times daily as needed for dizziness or nausea.  10/15/15   Dixie Dials, MD  metFORMIN (GLUCOPHAGE) 500 MG tablet Take 500 mg by mouth 2 (two) times daily. 01/06/14   Historical Provider, MD  NITROSTAT 0.4 MG SL tablet PLACE 1 TABLET UNDER TONGUE IF NEEDED FOR CHEST PAIN EVERY 5 MINUTES FOR MAX OF 3 DOSES 09/23/15   Historical Provider, MD  polyethylene glycol (MIRALAX / GLYCOLAX) packet Take 17 g by mouth daily. Patient taking differently: Take 17 g by mouth daily as needed for mild constipation or moderate constipation.  10/15/15   Dixie Dials, MD  potassium chloride (K-DUR) 10 MEQ tablet Take 10 mEq by mouth 2 (two) times daily.     Historical Provider, MD  predniSONE (DELTASONE) 10 MG tablet 3 daily x 2 days then 2 daily x 3 days, then 1 daily x 6 days, then 1/2 daily x 12 days Patient not taking: Reported on 11/25/2015 10/15/15   Dixie Dials, MD  PROAIR HFA 108 (90 BASE) MCG/ACT inhaler INHALE 2 PUFFS EVERY 4 TO 6 HOURS AS NEEDED FOR SHORTNESS OF BREATH 09/23/15   Historical Provider, MD  rosuvastatin (CRESTOR) 10 MG tablet Take 10 mg by mouth 2 (two) times a week.  09/23/15   Historical Provider, MD  sodium chloride (OCEAN) 0.65 % SOLN nasal spray Place 1 spray into both nostrils as needed for congestion. 10/06/15   [place], NP  traMADol (ULTRAM) 50 MG tablet Take 50 mg by mouth 2 (two) times daily as needed for moderate pain or severe pain.  10/15/15   Historical Provider, MD   BP 152/71 mmHg  Pulse 83  Temp(Src) 97.7 F (36.5 C) (Oral)  Resp 20  Wt 99.338 kg  SpO2 98% Physical Exam  Constitutional: She is oriented to person, place, and time. She appears well-developed and well-nourished. No distress.  HENT:  Head: Normocephalic and atraumatic.  Eyes: Conjunctivae are normal. No scleral icterus.  Neck: Normal range of motion.  Cardiovascular: Normal rate, regular rhythm and normal heart sounds.  Exam reveals no  gallop and no friction rub.   No murmur heard. Pulmonary/Chest: Effort normal and breath sounds normal. No respiratory distress. She has no wheezes. She has no rales.  Abdominal: Soft. Bowel sounds are normal. She exhibits no distension and no mass. There is no tenderness. There is no guarding.  No suprapubic tenderness  Musculoskeletal:  Tender to palpation along the left lower lumbar musculature. No CVA tenderness, no midline tenderness  Neurological: She is alert and oriented to person, place, and time.  Skin: Skin is warm and dry. She is not diaphoretic.  No rashes or signs of  shingles infection  Nursing note and vitals reviewed.   ED Course  Procedures (including critical care time) Labs Review Labs Reviewed  BASIC METABOLIC PANEL - Abnormal; Notable for the following:    Sodium 131 (*)    Chloride 96 (*)    Glucose, Bld 127 (*)    Creatinine, Ser 1.21 (*)    GFR calc non Af Amer 45 (*)    GFR calc Af Amer 52 (*)    All other components within normal limits  URINALYSIS, ROUTINE W REFLEX MICROSCOPIC (NOT AT [facility])  CBC    Imaging Review No results found. I have personally reviewed and evaluated these images and lab results as part of my medical decision-making.   EKG Interpretation None      MDM   Final diagnoses:  Left-sided low back pain without sciatica  Vaginitis    Patient with mild hyponatremia, her urine is negative for infection. CT scan is negative. I suspect either atrophic vaginitis or yeast vaginitis. We'll discharge with topical ketoconazole cream. Patient has an OB/GYN provider and will follow up with the provider. Her CT scan is negative for renal stones, hydronephrosis or other signs of urinary tract abnormality. No other acute abnormalities are seen on CT scan. Patient will be discharged with pain medication and muscle relaxer. She states her pain is improved after treatment. She appears safe for discharge at this time.  Margarita Mail,  PA-C 02/21/16 0107  [name], MD 02/25/16 1147

## 2016-02-26 ENCOUNTER — Emergency Department (HOSPITAL_COMMUNITY)
Admission: EM | Admit: 2016-02-26 | Discharge: 2016-02-26 | Disposition: A | Discharge status: Home / Self Care | Payer: Medicare HMO | Attending: Emergency Medicine | Admitting: Emergency Medicine

## 2016-02-26 ENCOUNTER — Encounter (HOSPITAL_COMMUNITY): Payer: Self-pay | Admitting: Emergency Medicine

## 2016-02-26 DIAGNOSIS — R51 Headache: Secondary | ICD-10-CM | POA: Diagnosis not present

## 2016-02-26 DIAGNOSIS — R519 Headache, unspecified: Secondary | ICD-10-CM

## 2016-02-26 DIAGNOSIS — I10 Essential (primary) hypertension: Secondary | ICD-10-CM | POA: Diagnosis present

## 2016-02-26 DIAGNOSIS — E119 Type 2 diabetes mellitus without complications: Secondary | ICD-10-CM | POA: Diagnosis not present

## 2016-02-26 DIAGNOSIS — Z7984 Long term (current) use of oral hypoglycemic drugs: Secondary | ICD-10-CM | POA: Diagnosis not present

## 2016-02-26 DIAGNOSIS — F17210 Nicotine dependence, cigarettes, uncomplicated: Secondary | ICD-10-CM | POA: Diagnosis not present

## 2016-02-26 DIAGNOSIS — K219 Gastro-esophageal reflux disease without esophagitis: Secondary | ICD-10-CM | POA: Insufficient documentation

## 2016-02-26 DIAGNOSIS — G8929 Other chronic pain: Secondary | ICD-10-CM | POA: Insufficient documentation

## 2016-02-26 DIAGNOSIS — Z79899 Other long term (current) drug therapy: Secondary | ICD-10-CM | POA: Insufficient documentation

## 2016-02-26 DIAGNOSIS — M1990 Unspecified osteoarthritis, unspecified site: Secondary | ICD-10-CM | POA: Insufficient documentation

## 2016-02-26 LAB — CBC WITH DIFFERENTIAL/PLATELET
BASOS ABS: 0 10*3/uL (ref 0.0–0.1)
BASOS PCT: 1 %
EOS ABS: 0.6 10*3/uL (ref 0.0–0.7)
EOS PCT: 7 %
HCT: 36 % (ref 36.0–46.0)
Hemoglobin: 11.7 g/dL — ABNORMAL LOW (ref 12.0–15.0)
Lymphocytes Relative: 36 %
Lymphs Abs: 3.1 10*3/uL (ref 0.7–4.0)
MCH: 29.1 pg (ref 26.0–34.0)
MCHC: 32.5 g/dL (ref 30.0–36.0)
MCV: 89.6 fL (ref 78.0–100.0)
MONO ABS: 0.6 10*3/uL (ref 0.1–1.0)
Monocytes Relative: 7 %
NEUTROS ABS: 4.3 10*3/uL (ref 1.7–7.7)
Neutrophils Relative %: 49 %
PLATELETS: 310 10*3/uL (ref 150–400)
RBC: 4.02 MIL/uL (ref 3.87–5.11)
RDW: 12.8 % (ref 11.5–15.5)
WBC: 8.6 10*3/uL (ref 4.0–10.5)

## 2016-02-26 LAB — BASIC METABOLIC PANEL
ANION GAP: 11 (ref 5–15)
BUN: 11 mg/dL (ref 6–20)
CALCIUM: 9.4 mg/dL (ref 8.9–10.3)
CO2: 24 mmol/L (ref 22–32)
CREATININE: 1.04 mg/dL — AB (ref 0.44–1.00)
Chloride: 103 mmol/L (ref 101–111)
GFR calc Af Amer: >60 mL/min (ref >60)
GFR, EST NON AFRICAN AMERICAN: 54 mL/min — AB (ref >60)
GLUCOSE: 130 mg/dL — AB (ref 65–99)
Potassium: 3.9 mmol/L (ref 3.5–5.1)
Sodium: 138 mmol/L (ref 135–145)

## 2016-02-26 LAB — I-STAT TROPONIN, ED: TROPONIN I, POC: 0 ng/mL (ref 0.00–0.08)

## 2016-02-26 NOTE — ED Notes (Signed)
Pt reports that she checked her blood pressure 202/146 at home, she took a clonidine and drove to the fire department.  C/O dizziness, Headache, blurred vision, denies N/V/D/SOB.  Negative stroke screen.

## 2016-02-26 NOTE — ED Provider Notes (Signed)
CSN: [id number]     Arrival date & time 02/26/16  2051 History   First MD Initiated Contact with Patient 02/26/16 2054     No chief complaint on file.    (Consider location/radiation/quality/duration/timing/severity/associated sxs/prior Treatment) HPI Comments: Patient is a 70 year old female with history of diabetes, hypertension. She presents for evaluation of elevated blood pressure. She tells me that she checks her blood pressure twice a day and this evening noted that it was quite high. She denies any missed doses of her medications. She denies any chest pain or shortness of breath, but does report having a headache.  The history is provided by the patient.    Past Medical History  Diagnosis Date  . Diabetes mellitus   . Hypertension   . GERD (gastroesophageal reflux disease)   . Herniated disc   . Arthritis   . Shortness of breath on exertion 11/10/11    "sometimes"  . H/O hiatal hernia   . Headache(784.0)   . Hemorrhoids   . Chronic back pain greater than 3 months duration   . Vertigo   . Chronic neck pain   . Hx of echocardiogram 2010    normal EF   Past Surgical History  Procedure Laterality Date  . Knee surgery      right  . Shoulder open rotator cuff repair  07/20/2011    left  . Fracture surgery  2004?    right shoulder  . Knee arthroscopy  2004    right  . Tonsillectomy    . Cholecystectomy  ~ 2008  . Abdominal hysterectomy  1997?    "partial"  . Neck fusion     No family history on file. Social History  Substance Use Topics  . Smoking status: Current Some Day Smoker -- 0.20 packs/day for 40 years    Types: Cigarettes  . Smokeless tobacco: Never Used     Comment: smoking cessation consult entered  . Alcohol Use: No   OB History    No data available     Review of Systems  All other systems reviewed and are negative.     Allergies  Oxycodone  Home Medications   Prior to Admission medications   Medication Sig Start Date End Date Taking?  Authorizing Provider  albuterol (PROVENTIL) (2.5 MG/3ML) 0.083% nebulizer solution USE 1 VIAL EVERY 6 HOURS AS NEEDED FOR SHORTNESS OF BREATH 10/15/15   Historical Provider, MD  ALPRAZolam Duanne Moron) 0.25 MG tablet Take 0.25 mg by mouth 2 (two) times daily as needed for anxiety or sleep.  10/16/14   Historical Provider, MD  amLODipine (NORVASC) 5 MG tablet Take 10 mg by mouth daily.     Historical Provider, MD  cloNIDine (CATAPRES) 0.1 MG tablet Take 0.05 mg by mouth at bedtime as needed (high blood pressure).     Historical Provider, MD  cyclobenzaprine (FLEXERIL) 10 MG tablet Take 1 tablet (10 mg total) by mouth 2 (two) times daily as needed for muscle spasms. 02/20/16   Margarita Mail, PA-C  esomeprazole (NEXIUM) 40 MG capsule Take 40 mg by mouth daily as needed (for heartburn).     Historical Provider, MD  guaiFENesin (MUCINEX) 600 MG 12 hr tablet Take 1 tablet (600 mg total) by mouth 2 (two) times daily. Patient taking differently: Take 600 mg by mouth 2 (two) times daily as needed for cough or to loosen phlegm.  10/15/15   Dixie Dials, MD  hydrALAZINE (APRESOLINE) 50 MG tablet Take 50 mg by mouth every 8 (eight)  hours. 11/14/15   Historical Provider, MD  HYDROcodone-acetaminophen (NORCO) 5-325 MG tablet Take 2 tablets by mouth every 4 (four) hours as needed. 02/20/16   Margarita Mail, PA-C  ketoconazole (NIZORAL) 2 % cream Apply 1 application daily to the external vaginal area. 02/20/16   Margarita Mail, PA-C  levofloxacin (LEVAQUIN) 500 MG tablet Take 1 tablet (500 mg total) by mouth daily. 10/16/15   Dixie Dials, MD  losartan (COZAAR) 100 MG tablet Take 100 mg by mouth daily. 09/20/14   Historical Provider, MD  meclizine (ANTIVERT) 12.5 MG tablet Take 1 tablet (12.5 mg total) by mouth 2 (two) times daily. Patient taking differently: Take 12.5 mg by mouth 2 (two) times daily as needed for dizziness or nausea.  10/15/15   Dixie Dials, MD  metFORMIN (GLUCOPHAGE) 500 MG tablet Take 500 mg by mouth 2  (two) times daily. 01/06/14   Historical Provider, MD  NITROSTAT 0.4 MG SL tablet PLACE 1 TABLET UNDER TONGUE IF NEEDED FOR CHEST PAIN EVERY 5 MINUTES FOR MAX OF 3 DOSES 09/23/15   Historical Provider, MD  polyethylene glycol (MIRALAX / GLYCOLAX) packet Take 17 g by mouth daily. Patient taking differently: Take 17 g by mouth daily as needed for mild constipation or moderate constipation.  10/15/15   Dixie Dials, MD  potassium chloride (K-DUR) 10 MEQ tablet Take 10 mEq by mouth 2 (two) times daily.     Historical Provider, MD  PROAIR HFA 108 (90 BASE) MCG/ACT inhaler INHALE 2 PUFFS EVERY 4 TO 6 HOURS AS NEEDED FOR SHORTNESS OF BREATH 09/23/15   Historical Provider, MD  sodium chloride (OCEAN) 0.65 % SOLN nasal spray Place 1 spray into both nostrils as needed for congestion. 10/06/15   [place], NP  traMADol (ULTRAM) 50 MG tablet Take 50 mg by mouth 2 (two) times daily as needed for moderate pain or severe pain.  10/15/15   Historical Provider, MD   There were no vitals taken for this visit. Physical Exam  Constitutional: She is oriented to person, place, and time. She appears well-developed and well-nourished. No distress.  HENT:  Head: Normocephalic and atraumatic.  Mouth/Throat: Oropharynx is clear and moist.  Eyes: EOM are normal. Pupils are equal, round, and reactive to light.  Neck: Normal range of motion. Neck supple.  Cardiovascular: Normal rate and regular rhythm.  Exam reveals no gallop and no friction rub.   No murmur heard. Pulmonary/Chest: Effort normal and breath sounds normal. No respiratory distress. She has no wheezes.  Abdominal: Soft. Bowel sounds are normal. She exhibits no distension. There is no tenderness.  Musculoskeletal: Normal range of motion.  Neurological: She is alert and oriented to person, place, and time. No cranial nerve deficit. She exhibits normal muscle tone. Coordination normal.  Skin: Skin is warm and dry. She is not diaphoretic.  Nursing note and  vitals reviewed.   ED Course  Procedures (including critical care time) Labs Review Labs Reviewed - No data to display  Imaging Review No results found. I have personally reviewed and evaluated these images and lab results as part of my medical decision-making.   EKG Interpretation   Date/Time:  Wednesday February 26 2016 22:00:45 EDT Ventricular Rate:  62 PR Interval:  188 QRS Duration: 96 QT Interval:  432 QTC Calculation: 439 R Axis:   -10 Text Interpretation:  Sinus rhythm Low voltage, precordial leads  Anteroseptal infarct, old Confirmed by Melbert Botelho  MD, Babak Lucus ([id number]) on  02/26/2016 10:32:25 PM      MDM  Final diagnoses:  None    Patient presents here with complaints of elevated blood pressure and headache. She states it was over 975 systolic at home, however here it is normal. She is neurologically intact and appears to be feeling better. At this point I see no reason for admission or other intervention. She will be discharged with instructions to follow-up her blood pressures with her primary Dr.    Veryl Speak, MD 02/26/16 2312

## 2016-02-26 NOTE — Discharge Instructions (Signed)
Continue your blood pressure medications as before. Keep a record of your blood pressures which he can take with you to your next doctor's appointment to discuss.   General Headache Without Cause A headache is pain or discomfort felt around the head or neck area. The specific cause of a headache Kazia not be found. There are many causes and types of headaches. A few common ones are:  Tension headaches.  Migraine headaches.  Cluster headaches.  Chronic daily headaches. HOME CARE INSTRUCTIONS  Watch your condition for any changes. Take these steps to help with your condition: Managing Pain  Take over-the-counter and prescription medicines only as told by your health care provider.  Lie down in a dark, quiet room when you have a headache.  If directed, apply ice to the head and neck area:  Put ice in a plastic bag.  Place a towel between your skin and the bag.  Leave the ice on for 20 minutes, 2-3 times per day.  Use a heating pad or hot shower to apply heat to the head and neck area as told by your health care provider.  Keep lights dim if bright lights bother you or make your headaches worse. Eating and Drinking  Eat meals on a regular schedule.  Limit alcohol use.  Decrease the amount of caffeine you drink, or stop drinking caffeine. General Instructions  Keep all follow-up visits as told by your health care provider. This is important.  Keep a headache journal to help find out what Katherin trigger your headaches. For example, write down:  What you eat and drink.  How much sleep you get.  Any change to your diet or medicines.  Try massage or other relaxation techniques.  Limit stress.  Sit up straight, and do not tense your muscles.  Do not use tobacco products, including cigarettes, chewing tobacco, or e-cigarettes. If you need help quitting, ask your health care provider.  Exercise regularly as told by your health care provider.  Sleep on a regular schedule.  Get 7-9 hours of sleep, or the amount recommended by your health care provider. SEEK MEDICAL CARE IF:   Your symptoms are not helped by medicine.  You have a headache that is different from the usual headache.  You have nausea or you vomit.  You have a fever. SEEK IMMEDIATE MEDICAL CARE IF:   Your headache becomes severe.  You have repeated vomiting.  You have a stiff neck.  You have a loss of vision.  You have problems with speech.  You have pain in the eye or ear.  You have muscular weakness or loss of muscle control.  You lose your balance or have trouble walking.  You feel faint or pass out.  You have confusion.   This information is not intended to replace advice given to you by your health care provider. Make sure you discuss any questions you have with your health care provider.   Document Released: 11/09/2005 Document Revised: 07/31/2015 Document Reviewed: 03/04/2015 Elsevier Interactive Patient Education 2016 [employer].  Hypertension Hypertension, commonly called high blood pressure, is when the force of blood pumping through your arteries is too strong. Your arteries are the blood vessels that carry blood from your heart throughout your body. A blood pressure reading consists of a higher number over a lower number, such as 110/72. The higher number (systolic) is the pressure inside your arteries when your heart pumps. The lower number (diastolic) is the pressure inside your arteries when your heart  relaxes. Ideally you want your blood pressure below 120/80. Hypertension forces your heart to work harder to pump blood. Your arteries Caryn become narrow or stiff. Having untreated or uncontrolled hypertension can cause heart attack, stroke, kidney disease, and other problems. RISK FACTORS Some risk factors for high blood pressure are controllable. Others are not.  Risk factors you cannot control include:   Race. You Caniya be at higher risk if you are African  American.  Age. Risk increases with age.  Gender. Men are at higher risk than women before age 24 years. After age 31, women are at higher risk than men. Risk factors you can control include:  Not getting enough exercise or physical activity.  Being overweight.  Getting too much fat, sugar, calories, or salt in your diet.  Drinking too much alcohol. SIGNS AND SYMPTOMS Hypertension does not usually cause signs or symptoms. Extremely high blood pressure (hypertensive crisis) Kaitlan cause headache, anxiety, shortness of breath, and nosebleed. DIAGNOSIS To check if you have hypertension, your health care provider will measure your blood pressure while you are seated, with your arm held at the level of your heart. It should be measured at least twice using the same arm. Certain conditions can cause a difference in blood pressure between your right and left arms. A blood pressure reading that is higher than normal on one occasion does not mean that you need treatment. If it is not clear whether you have high blood pressure, you Maily be asked to return on a different day to have your blood pressure checked again. Or, you Waynesha be asked to monitor your blood pressure at home for 1 or more weeks. TREATMENT Treating high blood pressure includes making lifestyle changes and possibly taking medicine. Living a healthy lifestyle can help lower high blood pressure. You Dae need to change some of your habits. Lifestyle changes Kennisha include:  Following the DASH diet. This diet is high in fruits, vegetables, and whole grains. It is low in salt, red meat, and added sugars.  Keep your sodium intake below 2,300 mg per day.  Getting at least 30-45 minutes of aerobic exercise at least 4 times per week.  Losing weight if necessary.  Not smoking.  Limiting alcoholic beverages.  Learning ways to reduce stress. Your health care provider Alyx prescribe medicine if lifestyle changes are not enough to get your blood  pressure under control, and if one of the following is true:  You are 82-30 years of age and your systolic blood pressure is above 140.  You are 20 years of age or older, and your systolic blood pressure is above 150.  Your diastolic blood pressure is above 90.  You have diabetes, and your systolic blood pressure is over 258 or your diastolic blood pressure is over 90.  You have kidney disease and your blood pressure is above 140/90.  You have heart disease and your blood pressure is above 140/90. Your personal target blood pressure Chanon vary depending on your medical conditions, your age, and other factors. HOME CARE INSTRUCTIONS  Have your blood pressure rechecked as directed by your health care provider.   Take medicines only as directed by your health care provider. Follow the directions carefully. Blood pressure medicines must be taken as prescribed. The medicine does not work as well when you skip doses. Skipping doses also puts you at risk for problems.  Do not smoke.   Monitor your blood pressure at home as directed by your health care provider. SEEK  MEDICAL CARE IF:   You think you are having a reaction to medicines taken.  You have recurrent headaches or feel dizzy.  You have swelling in your ankles.  You have trouble with your vision. SEEK IMMEDIATE MEDICAL CARE IF:  You develop a severe headache or confusion.  You have unusual weakness, numbness, or feel faint.  You have severe chest or abdominal pain.  You vomit repeatedly.  You have trouble breathing. MAKE SURE YOU:   Understand these instructions.  Will watch your condition.  Will get help right away if you are not doing well or get worse.   This information is not intended to replace advice given to you by your health care provider. Make sure you discuss any questions you have with your health care provider.   Document Released: 11/09/2005 Document Revised: 03/26/2015 Document Reviewed:  09/01/2013 Elsevier Interactive Patient Education [employer].

## 2016-03-10 ENCOUNTER — Other Ambulatory Visit: Payer: Self-pay

## 2016-03-10 DIAGNOSIS — Z1231 Encounter for screening mammogram for malignant neoplasm of breast: Secondary | ICD-10-CM

## 2016-03-25 ENCOUNTER — Ambulatory Visit
Admission: RE | Admit: 2016-03-25 | Discharge: 2016-03-25 | Disposition: A | Discharge status: Home / Self Care | Payer: Medicare HMO | Source: Ambulatory Visit

## 2016-03-25 DIAGNOSIS — Z1231 Encounter for screening mammogram for malignant neoplasm of breast: Secondary | ICD-10-CM

## 2016-05-03 ENCOUNTER — Emergency Department (HOSPITAL_COMMUNITY): Payer: Medicare HMO

## 2016-05-03 ENCOUNTER — Emergency Department (HOSPITAL_COMMUNITY)
Admission: EM | Admit: 2016-05-03 | Discharge: 2016-05-03 | Disposition: A | Discharge status: Home / Self Care | Payer: Medicare HMO | Attending: Emergency Medicine | Admitting: Emergency Medicine

## 2016-05-03 ENCOUNTER — Encounter (HOSPITAL_COMMUNITY): Payer: Self-pay

## 2016-05-03 DIAGNOSIS — Z7984 Long term (current) use of oral hypoglycemic drugs: Secondary | ICD-10-CM | POA: Diagnosis not present

## 2016-05-03 DIAGNOSIS — Z79899 Other long term (current) drug therapy: Secondary | ICD-10-CM | POA: Insufficient documentation

## 2016-05-03 DIAGNOSIS — I10 Essential (primary) hypertension: Secondary | ICD-10-CM | POA: Diagnosis not present

## 2016-05-03 DIAGNOSIS — R51 Headache: Secondary | ICD-10-CM | POA: Insufficient documentation

## 2016-05-03 DIAGNOSIS — R519 Headache, unspecified: Secondary | ICD-10-CM

## 2016-05-03 DIAGNOSIS — F17210 Nicotine dependence, cigarettes, uncomplicated: Secondary | ICD-10-CM | POA: Insufficient documentation

## 2016-05-03 DIAGNOSIS — E119 Type 2 diabetes mellitus without complications: Secondary | ICD-10-CM | POA: Diagnosis not present

## 2016-05-03 DIAGNOSIS — R252 Cramp and spasm: Secondary | ICD-10-CM | POA: Diagnosis not present

## 2016-05-03 LAB — HEPATIC FUNCTION PANEL
ALT: 19 U/L (ref 14–54)
AST: 25 U/L (ref 15–41)
Albumin: 4 g/dL (ref 3.5–5.0)
Alkaline Phosphatase: 104 U/L (ref 38–126)
Bilirubin, Direct: 0.1 mg/dL (ref 0.1–0.5)
Indirect Bilirubin: 0.5 mg/dL (ref 0.3–0.9)
Total Bilirubin: 0.6 mg/dL (ref 0.3–1.2)
Total Protein: 7.6 g/dL (ref 6.5–8.1)

## 2016-05-03 LAB — BASIC METABOLIC PANEL
ANION GAP: 11 (ref 5–15)
BUN: 12 mg/dL (ref 6–20)
CALCIUM: 10 mg/dL (ref 8.9–10.3)
CO2: 26 mmol/L (ref 22–32)
Chloride: 98 mmol/L — ABNORMAL LOW (ref 101–111)
Creatinine, Ser: 1.31 mg/dL — ABNORMAL HIGH (ref 0.44–1.00)
GFR, EST AFRICAN AMERICAN: 47 mL/min — AB (ref >60)
GFR, EST NON AFRICAN AMERICAN: 41 mL/min — AB (ref >60)
Glucose, Bld: 152 mg/dL — ABNORMAL HIGH (ref 65–99)
Potassium: 4.2 mmol/L (ref 3.5–5.1)
SODIUM: 135 mmol/L (ref 135–145)

## 2016-05-03 LAB — I-STAT TROPONIN, ED: TROPONIN I, POC: 0 ng/mL (ref 0.00–0.08)

## 2016-05-03 LAB — CBC
HCT: 39.5 % (ref 36.0–46.0)
HEMOGLOBIN: 13 g/dL (ref 12.0–15.0)
MCH: 29.3 pg (ref 26.0–34.0)
MCHC: 32.9 g/dL (ref 30.0–36.0)
MCV: 89 fL (ref 78.0–100.0)
Platelets: 432 10*3/uL — ABNORMAL HIGH (ref 150–400)
RBC: 4.44 MIL/uL (ref 3.87–5.11)
RDW: 13.2 % (ref 11.5–15.5)
WBC: 8.9 10*3/uL (ref 4.0–10.5)

## 2016-05-03 LAB — LIPASE, BLOOD: Lipase: 24 U/L (ref 11–51)

## 2016-05-03 MED ORDER — OXYCODONE-ACETAMINOPHEN 5-325 MG PO TABS
1.0000 | ORAL_TABLET | Freq: Once | ORAL | Status: AC
  Administered 2016-05-03: 1 via ORAL
  Filled 2016-05-03: qty 1

## 2016-05-03 MED ORDER — SODIUM CHLORIDE 0.9 % IV BOLUS (SEPSIS): 1000.0000 mL | Freq: Once | INTRAVENOUS | Status: DC

## 2016-05-03 MED ORDER — TRAMADOL HCL 50 MG PO TABS: 50.0000 mg | ORAL_TABLET | Freq: Four times a day (QID) | ORAL | Status: DC | PRN

## 2016-05-03 MED ORDER — MORPHINE SULFATE (PF) 4 MG/ML IV SOLN: 4.0000 mg | Freq: Once | INTRAVENOUS | Status: DC

## 2016-05-03 NOTE — ED Notes (Signed)
Patient complains of leg cramps, left chest discomfort, dizziness and diarrhea x 1 day. Dizziness with ambulation. No neuro deficits

## 2016-05-03 NOTE — ED Provider Notes (Signed)
CSN: [id number]     Arrival date & time 05/03/16  1254 History   First MD Initiated Contact with Patient 05/03/16 1308     Chief Complaint  Patient presents with  . Hypertension  . Dizziness     (Consider location/radiation/quality/duration/timing/severity/associated sxs/prior Treatment) HPI   [name] is a 70 y.o F with a pmhx of DM, HTN, GERD who presents to the ED today with multiple complaints. Patient states that 3 days ago she developed a left-sided headache and lightheadedness. Patient states the headache comes and goes and typically located in the left side of her head. Patient reports feeling lightheaded upon standing and is also having intermittent blurry vision. Patient has had blurry vision in the past but states that it usually is not accompanied by other symptoms. Patient states that last night she took her home blood pressure and it was 178/94. She states that she "feels like my blood pressure has been high". Patient states that last night her right thigh and her right calf began cramping. The pain increased with walking. She attempted to massage and also poured vinegar and water on it without relief. The cramps lasted for several hours but eventually subsided. This morning when she woke up she had 4 episodes of diarrhea and then developed cramping in her left leg. At this point patient decided to come to the ED for further evaluation. While she was in x-ray in the emergency room patient developed cramping on the right side of her abdomen. Patient has not tried taking anything for her symptoms. Patient denies any shortness of breath, chest pain, neck pain or stiffness, fevers, chills.  Patient also reports that she is currently on prednisone for her bronchitis.  Past Medical History  Diagnosis Date  . Diabetes mellitus   . Hypertension   . GERD (gastroesophageal reflux disease)   . Herniated disc   . Arthritis   . Shortness of breath on exertion 11/10/11    "sometimes"  .  H/O hiatal hernia   . Headache(784.0)   . Hemorrhoids   . Chronic back pain greater than 3 months duration   . Vertigo   . Chronic neck pain   . Hx of echocardiogram 2010    normal EF   Past Surgical History  Procedure Laterality Date  . Knee surgery      right  . Shoulder open rotator cuff repair  07/20/2011    left  . Fracture surgery  2004?    right shoulder  . Knee arthroscopy  2004    right  . Tonsillectomy    . Cholecystectomy  ~ 2008  . Abdominal hysterectomy  1997?    "partial"  . Neck fusion     No family history on file. Social History  Substance Use Topics  . Smoking status: Current Some Day Smoker -- 0.20 packs/day for 40 years    Types: Cigarettes  . Smokeless tobacco: Never Used     Comment: smoking cessation consult entered  . Alcohol Use: No   OB History    No data available     Review of Systems  All other systems reviewed and are negative.     Allergies  Oxycodone  Home Medications   Prior to Admission medications   Medication Sig Start Date End Date Taking? Authorizing Provider  albuterol (PROVENTIL) (2.5 MG/3ML) 0.083% nebulizer solution USE 1 VIAL EVERY 6 HOURS AS NEEDED FOR SHORTNESS OF BREATH 10/15/15  Yes Historical Provider, MD  ALPRAZolam Duanne Moron) 0.25 MG  tablet Take 0.25 mg by mouth daily.  10/16/14  Yes Historical Provider, MD  amLODipine (NORVASC) 10 MG tablet Take 10 mg by mouth daily.   Yes Historical Provider, MD  amLODipine (NORVASC) 5 MG tablet Take 10 mg by mouth daily.    Yes Historical Provider, MD  Azilsartan-Chlorthalidone (EDARBYCLOR) 40-25 MG TABS Take 1 tablet by mouth daily.   Yes Historical Provider, MD  cloNIDine (CATAPRES) 0.1 MG tablet Take 0.1 mg by mouth 2 (two) times daily.    Yes Historical Provider, MD  cyclobenzaprine (FLEXERIL) 10 MG tablet Take 1 tablet (10 mg total) by mouth 2 (two) times daily as needed for muscle spasms. 02/20/16  Yes Margarita Mail, PA-C  esomeprazole (NEXIUM) 40 MG capsule Take 40 mg by  mouth daily as needed (for heartburn).    Yes Historical Provider, MD  hydrALAZINE (APRESOLINE) 50 MG tablet Take 50 mg by mouth every 8 (eight) hours.   Yes Historical Provider, MD  HYDROcodone-acetaminophen (NORCO) 5-325 MG tablet Take 2 tablets by mouth every 4 (four) hours as needed. Patient taking differently: Take 2 tablets by mouth every 4 (four) hours as needed for moderate pain.  02/20/16  Yes Margarita Mail, PA-C  metFORMIN (GLUCOPHAGE) 500 MG tablet Take 500 mg by mouth 2 (two) times daily. 01/06/14  Yes Historical Provider, MD  metoprolol (LOPRESSOR) 50 MG tablet Take 50 mg by mouth 2 (two) times daily.   Yes Historical Provider, MD  neomycin-polymyxin-hydrocortisone (CORTISPORIN) otic solution Place 1 drop into the left ear 3 (three) times daily.   Yes Historical Provider, MD  NITROSTAT 0.4 MG SL tablet PLACE 1 TABLET UNDER TONGUE IF NEEDED FOR CHEST PAIN EVERY 5 MINUTES FOR MAX OF 3 DOSES 09/23/15  Yes Historical Provider, MD  potassium chloride (K-DUR) 10 MEQ tablet Take 20 mEq by mouth 2 (two) times daily.    Yes Historical Provider, MD  predniSONE (STERAPRED UNI-PAK 21 TAB) 10 MG (21) TBPK tablet Take 10 mg by mouth See admin instructions. Pt takes '30mg'$  daily x2 days, then '20mg'$  daily x2 days, then '10mg'$  daily x7 days, then '5mg'$  daily x14 days   Yes Historical Provider, MD  PROAIR HFA 108 (90 BASE) MCG/ACT inhaler INHALE 2 PUFFS EVERY 4 TO 6 HOURS AS NEEDED FOR SHORTNESS OF BREATH 09/23/15  Yes Historical Provider, MD  guaiFENesin (MUCINEX) 600 MG 12 hr tablet Take 1 tablet (600 mg total) by mouth 2 (two) times daily. Patient not taking: Reported on 02/26/2016 10/15/15   Dixie Dials, MD  ketoconazole (NIZORAL) 2 % cream Apply 1 application daily to the external vaginal area. Patient not taking: Reported on 05/03/2016 02/20/16   Margarita Mail, PA-C  levofloxacin (LEVAQUIN) 500 MG tablet Take 1 tablet (500 mg total) by mouth daily. Patient not taking: Reported on 02/26/2016 10/16/15   Dixie Dials, MD  meclizine (ANTIVERT) 12.5 MG tablet Take 1 tablet (12.5 mg total) by mouth 2 (two) times daily. Patient not taking: Reported on 02/26/2016 10/15/15   Dixie Dials, MD  polyethylene glycol (MIRALAX / GLYCOLAX) packet Take 17 g by mouth daily. Patient not taking: Reported on 02/26/2016 10/15/15   Dixie Dials, MD  sodium chloride (OCEAN) 0.65 % SOLN nasal spray Place 1 spray into both nostrils as needed for congestion. Patient not taking: Reported on 02/26/2016 10/06/15   Ashley Murrain, NP   BP 170/77 mmHg  Pulse 100  Temp(Src) 98.3 F (36.8 C) (Oral)  Resp 20  Ht '5\' 7"'$  (1.702 m)  Wt 96.163 kg  BMI 33.20 kg/m2  SpO2 99% Physical Exam  Constitutional: She is oriented to person, place, and time. She appears well-developed and well-nourished. No distress.  HENT:  Head: Normocephalic and atraumatic.  Mouth/Throat: No oropharyngeal exudate.  Eyes: Conjunctivae and EOM are normal. Pupils are equal, round, and reactive to light. Right eye exhibits no discharge. Left eye exhibits no discharge. No scleral icterus.  Cardiovascular: Normal rate, regular rhythm, normal heart sounds and intact distal pulses.  Exam reveals no gallop and no friction rub.   No murmur heard. Pulmonary/Chest: Effort normal and breath sounds normal. No respiratory distress. She has no wheezes. She has no rales. She exhibits no tenderness.  Abdominal: Soft. Bowel sounds are normal. She exhibits no distension. There is no tenderness. There is no guarding.  Musculoskeletal: Normal range of motion. She exhibits no edema.  All compartments are soft. No LE swelling. NO calf tenderness.  Neurological: She is alert and oriented to person, place, and time. No cranial nerve deficit.  Strength 5/5 throughout. No sensory deficits.    Skin: Skin is warm and dry. No rash noted. She is not diaphoretic. No erythema. No pallor.  Psychiatric: She has a normal mood and affect. Her behavior is normal.  Nursing note and vitals  reviewed.   ED Course  Procedures (including critical care time) Labs Review Labs Reviewed  CBC - Abnormal; Notable for the following:    Platelets 432 (*)    All other components within normal limits  BASIC METABOLIC PANEL  HEPATIC FUNCTION PANEL  LIPASE, BLOOD  I-STAT TROPOININ, ED    Imaging Review Dg Chest 2 View  05/03/2016  CLINICAL DATA:  Dizziness.  Hypertension. EXAM: CHEST  2 VIEW COMPARISON:  12/25/2015. FINDINGS: Normal sized heart. Clear lungs. Mild thoracic spine degenerative changes. Cholecystectomy clips. IMPRESSION: No acute abnormality. Electronically Signed   By: Claudie Revering M.D.   On: 05/03/2016 13:41   I have personally reviewed and evaluated these images and lab results as part of my medical decision-making.   EKG Interpretation None      MDM   Final diagnoses:  Muscle cramping  Nonintractable headache, unspecified chronicity pattern, unspecified headache type    70 year old female with history of DM, HTN who presents to the ED today with multiple complaints including headache and dizziness onset 3 days ago, intermittent muscle cramping onset last night and 4 episodes of diarrhea today. Patient appears well in ED she is nontoxic appearing and is in no apparent distress. She did have some abdominal cramping on x-ray in the ED which has since resolved. She's not complaining of any chest pain or shortness of breath. All vital signs are stable. No neurological deficits. No metabolic derangement on lab work. Creatinine mildly elevated at 1.3 but this is at baseline when compared to previous lab results. CT head negative. All compartments are soft. No lower extremity swelling. Patient has low risk well's criteria. Doubt DVT. Pain improved after Percocet. Patient has had recent sinus infection and bronchitis. She is currently taking steroids. This Meta be the cause of her headache. The patient is safe for discharge at this time. Recommend increase fluid intake and  follow-up with PCP. Will DC with tramadol prescription for pain. Return precautions outlined in patient discharge instructions.  Patient was discussed with and seen by Dr. Stark Jock who agrees with the treatment plan.      Dondra Spry [place], PA-C 05/03/16 1616  Veryl Speak, MD 05/07/16 [phone]

## 2016-05-03 NOTE — ED Notes (Signed)
Patient transported to CT 

## 2016-05-03 NOTE — ED Notes (Signed)
Patient taken to Xray  

## 2016-05-03 NOTE — ED Notes (Signed)
Pt stable, ambulatory, states understanding of discharge instructions 

## 2016-05-03 NOTE — Discharge Instructions (Signed)
General Headache Without Cause A headache is pain or discomfort felt around the head or neck area. The specific cause of a headache [name] not be found. There are many causes and types of headaches. A few common ones are:  Tension headaches.  Migraine headaches.  Cluster headaches.  Chronic daily headaches. HOME CARE INSTRUCTIONS  Watch your condition for any changes. Take these steps to help with your condition: Managing Pain  Take over-the-counter and prescription medicines only as told by your health care provider.  Lie down in a dark, quiet room when you have a headache.  If directed, apply ice to the head and neck area:  Put ice in a plastic bag.  Place a towel between your skin and the bag.  Leave the ice on for 20 minutes, 2-3 times per day.  Use a heating pad or hot shower to apply heat to the head and neck area as told by your health care provider.  Keep lights dim if bright lights bother you or make your headaches worse. Eating and Drinking  Eat meals on a regular schedule.  Limit alcohol use.  Decrease the amount of caffeine you drink, or stop drinking caffeine. General Instructions  Keep all follow-up visits as told by your health care provider. This is important.  Keep a headache journal to help find out what Shar trigger your headaches. For example, write down:  What you eat and drink.  How much sleep you get.  Any change to your diet or medicines.  Try massage or other relaxation techniques.  Limit stress.  Sit up straight, and do not tense your muscles.  Do not use tobacco products, including cigarettes, chewing tobacco, or e-cigarettes. If you need help quitting, ask your health care provider.  Exercise regularly as told by your health care provider.  Sleep on a regular schedule. Get 7-9 hours of sleep, or the amount recommended by your health care provider. SEEK MEDICAL CARE IF:   Your symptoms are not helped by medicine.  You have a  headache that is different from the usual headache.  You have nausea or you vomit.  You have a fever. SEEK IMMEDIATE MEDICAL CARE IF:   Your headache becomes severe.  You have repeated vomiting.  You have a stiff neck.  You have a loss of vision.  You have problems with speech.  You have pain in the eye or ear.  You have muscular weakness or loss of muscle control.  You lose your balance or have trouble walking.  You feel faint or pass out.  You have confusion.   This information is not intended to replace advice given to you by your health care provider. Make sure you discuss any questions you have with your health care provider.   Document Released: 11/09/2005 Document Revised: 07/31/2015 Document Reviewed: 03/04/2015 Elsevier Interactive Patient Education 2016 [employer]  Muscle Cramps and Spasms Muscle cramps and spasms occur when a muscle or muscles tighten and you have no control over this tightening (involuntary muscle contraction). They are a common problem and can develop in any muscle. The most common place is in the calf muscles of the leg. Both muscle cramps and muscle spasms are involuntary muscle contractions, but they also have differences:   Muscle cramps are sporadic and painful. They Nyrah last a few seconds to a quarter of an hour. Muscle cramps are often more forceful and last longer than muscle spasms.  Muscle spasms Armelia or Tamikia not be painful. They Alece  also last just a few seconds or much longer. CAUSES  It is uncommon for cramps or spasms to be due to a serious underlying problem. In many cases, the cause of cramps or spasms is unknown. Some common causes are:   Overexertion.   Overuse from repetitive motions (doing the same thing over and over).   Remaining in a certain position for a long period of time.   Improper preparation, form, or technique while performing a sport or activity.   Dehydration.   Injury.   Side effects of some  medicines.   Abnormally low levels of the salts and ions in your blood (electrolytes), especially potassium and calcium. This could happen if you are taking water pills (diuretics) or you are pregnant.  Some underlying medical problems can make it more likely to develop cramps or spasms. These include, but are not limited to:   Diabetes.   Parkinson disease.   Hormone disorders, such as thyroid problems.   Alcohol abuse.   Diseases specific to muscles, joints, and bones.   Blood vessel disease where not enough blood is getting to the muscles.  HOME CARE INSTRUCTIONS   Stay well hydrated. Drink enough water and fluids to keep your urine clear or pale yellow.  It Glorious be helpful to massage, stretch, and relax the affected muscle.  For tight or tense muscles, use a warm towel, heating pad, or hot shower water directed to the affected area.  If you are sore or have pain after a cramp or spasm, applying ice to the affected area Gladyes relieve discomfort.  Put ice in a plastic bag.  Place a towel between your skin and the bag.  Leave the ice on for 15-20 minutes, 03-04 times a day.  Medicines used to treat a known cause of cramps or spasms Burnis help reduce their frequency or severity. Only take over-the-counter or prescription medicines as directed by your caregiver. SEEK MEDICAL CARE IF:  Your cramps or spasms get more severe, more frequent, or do not improve over time.  MAKE SURE YOU:   Understand these instructions.  Will watch your condition.  Will get help right away if you are not doing well or get worse.   This information is not intended to replace advice given to you by your health care provider. Make sure you discuss any questions you have with your health care provider.   Document Released: 05/01/2002 Document Revised: 03/06/2013 Document Reviewed: 10/26/2012 Elsevier Interactive Patient Education 2016 [employer]  Leg Cramps Leg cramps occur when a muscle  or muscles tighten and you have no control over this tightening (involuntary muscle contraction). Muscle cramps can develop in any muscle, but the most common place is in the calf muscles of the leg. Those cramps can occur during exercise or when you are at rest. Leg cramps are painful, and they Maryellen last for a few seconds to a few minutes. Cramps Krystall return several times before they finally stop. Usually, leg cramps are not caused by a serious medical problem. In many cases, the cause is not known. Some common causes include:  Overexertion.  Overuse from repetitive motions, or doing the same thing over and over.  Remaining in a certain position for a long period of time.  Improper preparation, form, or technique while performing a sport or an activity.  Dehydration.  Injury.  Side effects of some medicines.  Abnormally low levels of the salts and ions in your blood (electrolytes), especially potassium and calcium. These  levels could be low if you are taking water pills (diuretics) or if you are pregnant. HOME CARE INSTRUCTIONS Watch your condition for any changes. Taking the following actions Marjon help to lessen any discomfort that you are feeling:  Stay well-hydrated. Drink enough fluid to keep your urine clear or pale yellow.  Try massaging, stretching, and relaxing the affected muscle. Do this for several minutes at a time.  For tight or tense muscles, use a warm towel, heating pad, or hot shower water directed to the affected area.  If you are sore or have pain after a cramp, applying ice to the affected area Tuwanda relieve discomfort.  Put ice in a plastic bag.  Place a towel between your skin and the bag.  Leave the ice on for 20 minutes, 2-3 times per day.  Avoid strenuous exercise for several days if you have been having frequent leg cramps.  Make sure that your diet includes the essential minerals for your muscles to work normally.  Take medicines only as directed by your  health care provider. SEEK MEDICAL CARE IF:  Your leg cramps get more severe or more frequent, or they do not improve over time.  Your foot becomes cold, numb, or blue.   This information is not intended to replace advice given to you by your health care provider. Make sure you discuss any questions you have with your health care provider.   Encourage adequate hydration, drink plenty of fluids. Take Tramadol as needed for pain and cramping. Follow up with your primary care provider for re-evaluation. Return to the ED if you experience severe worsening of your symptoms, vomiting, blood in stool , chest pain, difficulty breathing, neck pain/stiffness, loss of consciousness.

## 2016-06-02 ENCOUNTER — Ambulatory Visit
Admission: RE | Admit: 2016-06-02 | Discharge: 2016-06-02 | Disposition: A | Discharge status: Home / Self Care | Payer: Medicare HMO | Source: Ambulatory Visit | Attending: Gastroenterology | Admitting: Gastroenterology

## 2016-06-02 ENCOUNTER — Other Ambulatory Visit: Payer: Self-pay | Admitting: Gastroenterology

## 2016-06-02 DIAGNOSIS — R1033 Periumbilical pain: Secondary | ICD-10-CM

## 2016-06-17 ENCOUNTER — Encounter (HOSPITAL_COMMUNITY): Payer: Self-pay

## 2016-06-17 ENCOUNTER — Emergency Department (HOSPITAL_COMMUNITY)
Admission: EM | Admit: 2016-06-17 | Discharge: 2016-06-17 | Disposition: A | Discharge status: Home / Self Care | Payer: Medicare HMO | Attending: Emergency Medicine | Admitting: Emergency Medicine

## 2016-06-17 DIAGNOSIS — Z7984 Long term (current) use of oral hypoglycemic drugs: Secondary | ICD-10-CM | POA: Insufficient documentation

## 2016-06-17 DIAGNOSIS — F17210 Nicotine dependence, cigarettes, uncomplicated: Secondary | ICD-10-CM | POA: Diagnosis not present

## 2016-06-17 DIAGNOSIS — S199XXA Unspecified injury of neck, initial encounter: Secondary | ICD-10-CM | POA: Diagnosis present

## 2016-06-17 DIAGNOSIS — Y999 Unspecified external cause status: Secondary | ICD-10-CM | POA: Diagnosis not present

## 2016-06-17 DIAGNOSIS — X58XXXA Exposure to other specified factors, initial encounter: Secondary | ICD-10-CM | POA: Insufficient documentation

## 2016-06-17 DIAGNOSIS — E119 Type 2 diabetes mellitus without complications: Secondary | ICD-10-CM | POA: Diagnosis not present

## 2016-06-17 DIAGNOSIS — Y939 Activity, unspecified: Secondary | ICD-10-CM | POA: Insufficient documentation

## 2016-06-17 DIAGNOSIS — J449 Chronic obstructive pulmonary disease, unspecified: Secondary | ICD-10-CM | POA: Diagnosis not present

## 2016-06-17 DIAGNOSIS — S161XXA Strain of muscle, fascia and tendon at neck level, initial encounter: Secondary | ICD-10-CM | POA: Diagnosis not present

## 2016-06-17 DIAGNOSIS — M542 Cervicalgia: Secondary | ICD-10-CM

## 2016-06-17 DIAGNOSIS — I10 Essential (primary) hypertension: Secondary | ICD-10-CM | POA: Diagnosis not present

## 2016-06-17 DIAGNOSIS — Y929 Unspecified place or not applicable: Secondary | ICD-10-CM | POA: Insufficient documentation

## 2016-06-17 MED ORDER — DIAZEPAM 5 MG PO TABS
5.0000 mg | ORAL_TABLET | Freq: Once | ORAL | Status: AC
  Administered 2016-06-17: 5 mg via ORAL
  Filled 2016-06-17: qty 1

## 2016-06-17 NOTE — ED Triage Notes (Signed)
Patient here with left sided neck and posterior neck pain x 1 day. States that the pain is worse with movement. Describes the pain as radiating up into her head. Denies injury, no chest pain, no arm pain. NAD

## 2016-06-17 NOTE — Discharge Instructions (Signed)
There does not appear to be an emergent cause to her symptoms at this time. Please follow-up with your doctor for further evaluation and management of your symptoms. Return to ED for new or worsening symptoms.

## 2016-06-17 NOTE — ED Provider Notes (Signed)
[facility] DEPT Provider Note   CSN: [id number] Arrival date & time: 06/17/16  4010  First Provider Contact:  First MD Initiated Contact with Patient 06/17/16 [phone]        History   Chief Complaint Chief Complaint  Patient presents with  . Neck Pain    HPI [name] is a 70 y.o. female with a history of chronic back pain here for evaluation of acute left-sided neck pain. Symptoms started yesterday, described as sharp pain in the left side of her neck. Worse with movement and head rotation. She denies any associated fevers, chills, headache, vision changes, numbness or weakness. Nothing tried to improve symptoms. No other modifying factors.  HPI  Past Medical History:  Diagnosis Date  . Arthritis   . Chronic back pain greater than 3 months duration   . Chronic neck pain   . Diabetes mellitus   . GERD (gastroesophageal reflux disease)   . H/O hiatal hernia   . Headache(784.0)   . Hemorrhoids   . Herniated disc   . Hx of echocardiogram 2010   normal EF  . Hypertension   . Shortness of breath on exertion 11/10/11   "sometimes"  . Vertigo     Patient Active Problem List   Diagnosis Date Noted  . Acute exacerbation of chronic obstructive pulmonary disease (COPD) ([place]) 10/11/2015  . Dehydration 01/25/2015    Class: Acute  . Acute bronchitis due to infection 11/19/2014  . Orthostatic hypotension 06/18/2013  . Numbness and tingling in right hand 11/10/2011    Class: Acute  . Hypertension 11/10/2011  . Cervical disc disease 11/10/2011    Class: Chronic  . Headache 11/10/2011    Class: Acute  . DM II (diabetes mellitus, type II), controlled ([place]) 11/10/2011    Class: Chronic    Past Surgical History:  Procedure Laterality Date  . ABDOMINAL HYSTERECTOMY  1997?   "partial"  . CHOLECYSTECTOMY  ~ 2008  . FRACTURE SURGERY  2004?   right shoulder  . KNEE ARTHROSCOPY  2004   right  . KNEE SURGERY     right  . neck fusion    . SHOULDER OPEN ROTATOR CUFF REPAIR   07/20/2011   left  . TONSILLECTOMY      OB History    No data available       Home Medications    Prior to Admission medications   Medication Sig Start Date End Date Taking? Authorizing Provider  albuterol (PROVENTIL) (2.5 MG/3ML) 0.083% nebulizer solution USE 1 VIAL EVERY 6 HOURS AS NEEDED FOR SHORTNESS OF BREATH 10/15/15   Historical Provider, MD  ALPRAZolam Duanne Moron) 0.25 MG tablet Take 0.25 mg by mouth daily.  10/16/14   Historical Provider, MD  amLODipine (NORVASC) 10 MG tablet Take 10 mg by mouth daily.    Historical Provider, MD  amLODipine (NORVASC) 5 MG tablet Take 10 mg by mouth daily.     Historical Provider, MD  Azilsartan-Chlorthalidone (EDARBYCLOR) 40-25 MG TABS Take 1 tablet by mouth daily.    Historical Provider, MD  cloNIDine (CATAPRES) 0.1 MG tablet Take 0.1 mg by mouth 2 (two) times daily.     Historical Provider, MD  cyclobenzaprine (FLEXERIL) 10 MG tablet Take 1 tablet (10 mg total) by mouth 2 (two) times daily as needed for muscle spasms. 02/20/16   Margarita Mail, PA-C  esomeprazole (NEXIUM) 40 MG capsule Take 40 mg by mouth daily as needed (for heartburn).     Historical Provider, MD  guaiFENesin ([place]) 600  MG 12 hr tablet Take 1 tablet (600 mg total) by mouth 2 (two) times daily. Patient not taking: Reported on 02/26/2016 10/15/15   Dixie Dials, MD  hydrALAZINE (APRESOLINE) 50 MG tablet Take 50 mg by mouth every 8 (eight) hours.    Historical Provider, MD  HYDROcodone-acetaminophen (NORCO) 5-325 MG tablet Take 2 tablets by mouth every 4 (four) hours as needed. Patient taking differently: Take 2 tablets by mouth every 4 (four) hours as needed for moderate pain.  02/20/16   Margarita Mail, PA-C  ketoconazole (NIZORAL) 2 % cream Apply 1 application daily to the external vaginal area. Patient not taking: Reported on 05/03/2016 02/20/16   Margarita Mail, PA-C  levofloxacin (LEVAQUIN) 500 MG tablet Take 1 tablet (500 mg total) by mouth daily. Patient not taking: Reported  on 02/26/2016 10/16/15   Dixie Dials, MD  meclizine (ANTIVERT) 12.5 MG tablet Take 1 tablet (12.5 mg total) by mouth 2 (two) times daily. Patient not taking: Reported on 02/26/2016 10/15/15   Dixie Dials, MD  metFORMIN (GLUCOPHAGE) 500 MG tablet Take 500 mg by mouth 2 (two) times daily. 01/06/14   Historical Provider, MD  metoprolol (LOPRESSOR) 50 MG tablet Take 50 mg by mouth 2 (two) times daily.    Historical Provider, MD  neomycin-polymyxin-hydrocortisone (CORTISPORIN) otic solution Place 1 drop into the left ear 3 (three) times daily.    Historical Provider, MD  NITROSTAT 0.4 MG SL tablet PLACE 1 TABLET UNDER TONGUE IF NEEDED FOR CHEST PAIN EVERY 5 MINUTES FOR MAX OF 3 DOSES 09/23/15   Historical Provider, MD  polyethylene glycol (MIRALAX / GLYCOLAX) packet Take 17 g by mouth daily. Patient not taking: Reported on 02/26/2016 10/15/15   Dixie Dials, MD  potassium chloride (K-DUR) 10 MEQ tablet Take 20 mEq by mouth 2 (two) times daily.     Historical Provider, MD  predniSONE (STERAPRED UNI-PAK 21 TAB) 10 MG (21) TBPK tablet Take 10 mg by mouth See admin instructions. Pt takes '30mg'$  daily x2 days, then '20mg'$  daily x2 days, then '10mg'$  daily x7 days, then '5mg'$  daily x14 days    Historical Provider, MD  PROAIR HFA 108 (90 BASE) MCG/ACT inhaler INHALE 2 PUFFS EVERY 4 TO 6 HOURS AS NEEDED FOR SHORTNESS OF BREATH 09/23/15   Historical Provider, MD  sodium chloride (OCEAN) 0.65 % SOLN nasal spray Place 1 spray into both nostrils as needed for congestion. Patient not taking: Reported on 02/26/2016 10/06/15   [facility] Bunnie Pion, NP  traMADol (ULTRAM) 50 MG tablet Take 1 tablet (50 mg total) by mouth every 6 (six) hours as needed. 05/03/16   Samantha Tripp Dowless, PA-C    Family History No family history on file.  Social History Social History  Substance Use Topics  . Smoking status: Current Some Day Smoker    Packs/day: 0.20    Years: 40.00    Types: Cigarettes  . Smokeless tobacco: Never Used     Comment:  smoking cessation consult entered  . Alcohol use No     Allergies   Oxycodone   Review of Systems Review of Systems A 10 point review of systems was completed and was negative except for pertinent positives and negatives as mentioned in the history of present illness    Physical Exam Updated Vital Signs BP 125/83   Pulse 76   Temp 98.3 F (36.8 C) (Oral)   Resp 23   Ht '5\' 7"'$  (1.702 m)   Wt 97.1 kg   SpO2 100%   BMI 33.52  kg/m   Physical Exam  Constitutional: She appears well-developed. No distress.  Awake, alert and nontoxic in appearance  HENT:  Head: Normocephalic and atraumatic.  Right Ear: External ear normal.  Left Ear: External ear normal.  Mouth/Throat: Oropharynx is clear and moist.  Eyes: Conjunctivae and EOM are normal. Pupils are equal, round, and reactive to light.  Neck: Neck supple. No JVD present.  Discomfort is replicated with palpation of the paraspinal cervical musculature and trapezius. Replicated with rotation of neck left and right. No midline bony tenderness. No other abnormalities noted.  Cardiovascular: Normal rate, regular rhythm and normal heart sounds.   Pulmonary/Chest: Effort normal and breath sounds normal. No stridor.  Abdominal: Soft. There is no tenderness.  Musculoskeletal: Normal range of motion.  Neurological:  Awake, alert, cooperative and aware of situation; motor strength bilaterally; sensation normal to light touch bilaterally; no facial asymmetry; tongue midline; major cranial nerves appear intact;  baseline gait without new ataxia.  Skin: No rash noted. She is not diaphoretic.  Psychiatric: She has a normal mood and affect. Her behavior is normal. Thought content normal.  Nursing note and vitals reviewed.    ED Treatments / Results  Labs (all labs ordered are listed, but only abnormal results are displayed) Labs Reviewed - No data to display  EKG  EKG Interpretation  Date/Time:  Wednesday June 17 2016 07:15:26  EDT Ventricular Rate:  88 PR Interval:  178 QRS Duration: 80 QT Interval:  374 QTC Calculation: 452 R Axis:   -22 Text Interpretation:  Normal sinus rhythm Septal infarct , age undetermined Abnormal ECG No significant change since last tracing Confirmed by Wilson Singer  MD, STEPHEN ([id number]) on 06/17/2016 9:46:05 AM       Radiology No results found.  Procedures Procedures (including critical care time)  Medications Ordered in ED Medications  diazepam (VALIUM) tablet 5 mg (5 mg Oral Given 06/17/16 0917)     Initial Impression / Assessment and Plan / ED Course  I have reviewed the triage vital signs and the nursing notes.  Pertinent labs & imaging results that were available during my care of the patient were reviewed by me and considered in my medical decision making (see chart for details).  Clinical Course   Patient with history of chronic neck pain here for evaluation of acute neck pain.Symptoms replicated with palpation of the paraspinal musculature. Suspect MSK etiology. Nonfocal neuro exam, no signs or symptoms concerning for CVA or other emergent intracranial pathology.she is moving all extremities without difficulty, ambulates at baseline. Given Valium for likely muscle stiffness. Encouraged continued supportive care home, follow up with PCP. Return precautions discussed, she verbalizes understanding, agreed to plan and subsequent discharge. Prior to patient discharge, I discussed and reviewed this case with Dr.Kohut    Final Clinical Impressions(s) / ED Diagnoses   Final diagnoses:  Neck pain  Neck strain, initial encounter    New Prescriptions Discharge Medication List as of 06/17/2016 11:11 AM       Comer Locket, PA-C 06/17/16 1206    Virgel Manifold, MD 06/20/16 1452

## 2016-06-17 NOTE — ED Notes (Signed)
Pt. Wheeled back from waiting room to room by Darrick Grinder, RN.

## 2016-08-06 ENCOUNTER — Other Ambulatory Visit: Payer: Self-pay | Admitting: Cardiovascular Disease

## 2016-08-06 ENCOUNTER — Ambulatory Visit
Admission: RE | Admit: 2016-08-06 | Discharge: 2016-08-06 | Disposition: A | Discharge status: Home / Self Care | Payer: Medicare HMO | Source: Ambulatory Visit | Attending: Cardiovascular Disease | Admitting: Cardiovascular Disease

## 2016-08-06 DIAGNOSIS — M5412 Radiculopathy, cervical region: Secondary | ICD-10-CM

## 2016-08-06 DIAGNOSIS — M542 Cervicalgia: Secondary | ICD-10-CM

## 2016-09-12 ENCOUNTER — Emergency Department (HOSPITAL_COMMUNITY)
Admission: EM | Admit: 2016-09-12 | Discharge: 2016-09-12 | Disposition: A | Discharge status: Home / Self Care | Payer: Medicare HMO | Attending: Emergency Medicine | Admitting: Emergency Medicine

## 2016-09-12 ENCOUNTER — Encounter (HOSPITAL_COMMUNITY): Payer: Self-pay | Admitting: *Deleted

## 2016-09-12 DIAGNOSIS — R202 Paresthesia of skin: Secondary | ICD-10-CM | POA: Insufficient documentation

## 2016-09-12 DIAGNOSIS — I10 Essential (primary) hypertension: Secondary | ICD-10-CM | POA: Insufficient documentation

## 2016-09-12 DIAGNOSIS — M5412 Radiculopathy, cervical region: Secondary | ICD-10-CM

## 2016-09-12 DIAGNOSIS — J449 Chronic obstructive pulmonary disease, unspecified: Secondary | ICD-10-CM | POA: Diagnosis not present

## 2016-09-12 DIAGNOSIS — Z7984 Long term (current) use of oral hypoglycemic drugs: Secondary | ICD-10-CM | POA: Diagnosis not present

## 2016-09-12 DIAGNOSIS — F17210 Nicotine dependence, cigarettes, uncomplicated: Secondary | ICD-10-CM | POA: Insufficient documentation

## 2016-09-12 DIAGNOSIS — M542 Cervicalgia: Secondary | ICD-10-CM | POA: Diagnosis present

## 2016-09-12 DIAGNOSIS — E119 Type 2 diabetes mellitus without complications: Secondary | ICD-10-CM | POA: Insufficient documentation

## 2016-09-12 MED ORDER — HYDROCODONE-ACETAMINOPHEN 5-325 MG PO TABS: 1.0000 | ORAL_TABLET | Freq: Four times a day (QID) | ORAL | 0 refills | Status: DC | PRN

## 2016-09-12 NOTE — ED Notes (Signed)
Declined W/C at D/C and was escorted to lobby by RN. 

## 2016-09-12 NOTE — ED Triage Notes (Signed)
Pt reports having pain injection done on neck this past Tuesday. Now has increase in neck pain and upper back pain to posterior left shoulder. Pain increases with movement. Is ambulatory at triage.

## 2016-09-12 NOTE — ED Provider Notes (Signed)
[facility] DEPT Provider Note   CSN: [id number] Arrival date & time: 09/12/16  1616   By signing my name below, I, Dolores Hoose, attest that this documentation has been prepared under the direction and in the presence of non-physician practitioner, Montine Circle, PA-C. Electronically Signed: Dolores Hoose, Scribe. 09/12/2016. 5:27 PM.  History   Chief Complaint Chief Complaint  Patient presents with  . Neck Pain  . Shoulder Pain   The history is provided by the patient. No language interpreter was used.    HPI Comments:  [name] is a 70 y.o. female who presents to the Emergency Department complaining of sudden-onset gradually worsening constant neck pain beginning 2 days ago. She describes her pain as being a sharp pain in the back of her neck that radiates to her left shoulder. Her pain is exacerbated with movement.  Pt states that she was seen 4 days ago and given injections into her neck, and her pain has worsened since the procedure. She reports associated nausea and left-hand weakness. Pt denies any new fevers or chills. She was given the injections by Dr. Maryjean Ka of [facility].    Past Medical History:  Diagnosis Date  . Arthritis   . Chronic back pain greater than 3 months duration   . Chronic neck pain   . Diabetes mellitus   . GERD (gastroesophageal reflux disease)   . H/O hiatal hernia   . Headache(784.0)   . Hemorrhoids   . Herniated disc   . Hx of echocardiogram 2010   normal EF  . Hypertension   . Shortness of breath on exertion 11/10/11   "sometimes"  . Vertigo     Patient Active Problem List   Diagnosis Date Noted  . Acute exacerbation of chronic obstructive pulmonary disease (COPD) ([place]) 10/11/2015  . Dehydration 01/25/2015    Class: Acute  . Acute bronchitis due to infection 11/19/2014  . Orthostatic hypotension 06/18/2013  . Numbness and tingling in right hand 11/10/2011    Class: Acute  . Hypertension  11/10/2011  . Cervical disc disease 11/10/2011    Class: Chronic  . Headache 11/10/2011    Class: Acute  . DM II (diabetes mellitus, type II), controlled ([place]) 11/10/2011    Class: Chronic    Past Surgical History:  Procedure Laterality Date  . ABDOMINAL HYSTERECTOMY  1997?   "partial"  . CHOLECYSTECTOMY  ~ 2008  . FRACTURE SURGERY  2004?   right shoulder  . KNEE ARTHROSCOPY  2004   right  . KNEE SURGERY     right  . neck fusion    . SHOULDER OPEN ROTATOR CUFF REPAIR  07/20/2011   left  . TONSILLECTOMY      OB History    No data available       Home Medications    Prior to Admission medications   Medication Sig Start Date End Date Taking? Authorizing Provider  albuterol (PROVENTIL) (2.5 MG/3ML) 0.083% nebulizer solution USE 1 VIAL EVERY 6 HOURS AS NEEDED FOR SHORTNESS OF BREATH 10/15/15   Historical Provider, MD  ALPRAZolam Duanne Moron) 0.25 MG tablet Take 0.25 mg by mouth daily.  10/16/14   Historical Provider, MD  amLODipine (NORVASC) 10 MG tablet Take 10 mg by mouth daily.    Historical Provider, MD  amLODipine (NORVASC) 5 MG tablet Take 10 mg by mouth daily.     Historical Provider, MD  Azilsartan-Chlorthalidone (EDARBYCLOR) 40-25 MG TABS Take 1 tablet by mouth daily.    Historical  Provider, MD  cloNIDine (CATAPRES) 0.1 MG tablet Take 0.1 mg by mouth 2 (two) times daily.     Historical Provider, MD  cyclobenzaprine (FLEXERIL) 10 MG tablet Take 1 tablet (10 mg total) by mouth 2 (two) times daily as needed for muscle spasms. 02/20/16   Margarita Mail, PA-C  esomeprazole (NEXIUM) 40 MG capsule Take 40 mg by mouth daily as needed (for heartburn).     Historical Provider, MD  guaiFENesin ([place]) 600 MG 12 hr tablet Take 1 tablet (600 mg total) by mouth 2 (two) times daily. Patient not taking: Reported on 02/26/2016 10/15/15   Dixie Dials, MD  hydrALAZINE (APRESOLINE) 50 MG tablet Take 50 mg by mouth every 8 (eight) hours.    Historical Provider, MD  HYDROcodone-acetaminophen  (NORCO) 5-325 MG tablet Take 2 tablets by mouth every 4 (four) hours as needed. Patient taking differently: Take 2 tablets by mouth every 4 (four) hours as needed for moderate pain.  02/20/16   Margarita Mail, PA-C  ketoconazole (NIZORAL) 2 % cream Apply 1 application daily to the external vaginal area. Patient not taking: Reported on 05/03/2016 02/20/16   Margarita Mail, PA-C  levofloxacin (LEVAQUIN) 500 MG tablet Take 1 tablet (500 mg total) by mouth daily. Patient not taking: Reported on 02/26/2016 10/16/15   Dixie Dials, MD  meclizine (ANTIVERT) 12.5 MG tablet Take 1 tablet (12.5 mg total) by mouth 2 (two) times daily. Patient not taking: Reported on 02/26/2016 10/15/15   Dixie Dials, MD  metFORMIN (GLUCOPHAGE) 500 MG tablet Take 500 mg by mouth 2 (two) times daily. 01/06/14   Historical Provider, MD  metoprolol (LOPRESSOR) 50 MG tablet Take 50 mg by mouth 2 (two) times daily.    Historical Provider, MD  neomycin-polymyxin-hydrocortisone (CORTISPORIN) otic solution Place 1 drop into the left ear 3 (three) times daily.    Historical Provider, MD  NITROSTAT 0.4 MG SL tablet PLACE 1 TABLET UNDER TONGUE IF NEEDED FOR CHEST PAIN EVERY 5 MINUTES FOR MAX OF 3 DOSES 09/23/15   Historical Provider, MD  polyethylene glycol (MIRALAX / GLYCOLAX) packet Take 17 g by mouth daily. Patient not taking: Reported on 02/26/2016 10/15/15   Dixie Dials, MD  potassium chloride (K-DUR) 10 MEQ tablet Take 20 mEq by mouth 2 (two) times daily.     Historical Provider, MD  predniSONE (STERAPRED UNI-PAK 21 TAB) 10 MG (21) TBPK tablet Take 10 mg by mouth See admin instructions. Pt takes '30mg'$  daily x2 days, then '20mg'$  daily x2 days, then '10mg'$  daily x7 days, then '5mg'$  daily x14 days    Historical Provider, MD  PROAIR HFA 108 (90 BASE) MCG/ACT inhaler INHALE 2 PUFFS EVERY 4 TO 6 HOURS AS NEEDED FOR SHORTNESS OF BREATH 09/23/15   Historical Provider, MD  sodium chloride (OCEAN) 0.65 % SOLN nasal spray Place 1 spray into both nostrils as  needed for congestion. Patient not taking: Reported on 02/26/2016 10/06/15   [facility] Bunnie Pion, NP  traMADol (ULTRAM) 50 MG tablet Take 1 tablet (50 mg total) by mouth every 6 (six) hours as needed. 05/03/16   Samantha Tripp Dowless, PA-C    Family History History reviewed. No pertinent family history.  Social History Social History  Substance Use Topics  . Smoking status: Current Some Day Smoker    Packs/day: 0.20    Years: 40.00    Types: Cigarettes  . Smokeless tobacco: Never Used     Comment: smoking cessation consult entered  . Alcohol use No     Allergies  Oxycodone   Review of Systems Review of Systems  Constitutional: Negative for chills and fever.  Gastrointestinal: Positive for nausea.  Musculoskeletal: Positive for back pain and neck pain.     Physical Exam Updated Vital Signs BP 135/74 (BP Location: Right Arm)   Pulse 88   Temp 97.6 F (36.4 C) (Oral)   Resp 20   Ht '5\' 7"'$  (1.702 m)   Wt 216 lb (98 kg)   SpO2 100%   BMI 33.83 kg/m   Physical Exam Physical Exam  Constitutional: Pt appears well-developed and well-nourished. No distress.  HENT:  Head: Normocephalic and atraumatic.  Mouth/Throat: Oropharynx is clear and moist. No oropharyngeal exudate.  Eyes: Conjunctivae are normal.  Neck: Normal range of motion. Neck supple.  No meningismus Cardiovascular: Normal rate, regular rhythm and intact distal pulses.   Pulmonary/Chest: Effort normal and breath sounds normal. No respiratory distress. Pt has no wheezes.  Abdominal: Pt exhibits no distension Musculoskeletal:  Left upper cervical paraspinal muscles and upper trapezius tender to palpation, no bony CTLS spine tenderness, deformity, step-off, or crepitus Lymphadenopathy: Pt has no cervical adenopathy.  Neurological: Pt is alert and oriented Speech is clear and goal oriented, follows commands Normal 5/5 strength in upper and lower extremities bilaterally including dorsiflexion and plantar flexion,  strong and equal grip strength Sensation intact Moves extremities without ataxia, coordination intact Normal gait Normal balance No Clonus Skin: Skin is warm and dry. No rash noted. Pt is not diaphoretic. No erythema.  Psychiatric: Pt has a normal mood and affect. Behavior is normal.  Nursing note and vitals reviewed.   ED Treatments / Results  DIAGNOSTIC STUDIES:  Oxygen Saturation is 100% on RA, normal by my interpretation.    COORDINATION OF CARE:  5:27 PM Discussed treatment plan with pt at bedside which included  and pt agreed to plan.  Labs (all labs ordered are listed, but only abnormal results are displayed) Labs Reviewed - No data to display  EKG  EKG Interpretation None       Radiology No results found.  Procedures Procedures (including critical care time)  Medications Ordered in ED Medications - No data to display   Initial Impression / Assessment and Plan / ED Course  I have reviewed the triage vital signs and the nursing notes.  Pertinent labs & imaging results that were available during my care of the patient were reviewed by me and considered in my medical decision making (see chart for details).  Clinical Course    Patient with neck pain.  Seems to be consistent with cervical radiculopathy with left upper arm paresthesia.  Patient seen by and discussed with Dr. Lita Mains, who agrees with extremely low suspicion for infection, or other emergent issue.  Recommends DC with f/u on Monday.  Patient is ambulatory.  No loss of bowel or bladder control.  Doubt cauda equina.  Denies fever,  doubt epidural abscess or other lesion. Recommend back exercises, stretching, RICE, and will treat with a short course of norco.  Encouraged the patient that there could be a need for additional workup and/or imaging such as MRI, if the symptoms do not resolve. Patient advised that if the back pain does not resolve, or radiates, this could progress to more serious  conditions and is encouraged to follow-up with PCP or orthopedics within 2 weeks.     Final Clinical Impressions(s) / ED Diagnoses   Final diagnoses:  Cervical radiculopathy    New Prescriptions Discharge Medication List as of 09/12/2016  6:15 PM     I personally performed the services described in this documentation, which was scribed in my presence. The recorded information has been reviewed and is accurate.       Montine Circle, PA-C 09/12/16 1820    Julianne Rice, MD 09/18/16 [phone]

## 2016-09-30 ENCOUNTER — Emergency Department (HOSPITAL_COMMUNITY)
Admission: EM | Admit: 2016-09-30 | Discharge: 2016-09-30 | Disposition: A | Discharge status: Home / Self Care | Payer: Medicare HMO | Attending: Emergency Medicine | Admitting: Emergency Medicine

## 2016-09-30 ENCOUNTER — Emergency Department (HOSPITAL_COMMUNITY): Payer: Medicare HMO

## 2016-09-30 ENCOUNTER — Encounter (HOSPITAL_COMMUNITY): Payer: Self-pay | Admitting: Emergency Medicine

## 2016-09-30 DIAGNOSIS — R002 Palpitations: Secondary | ICD-10-CM | POA: Insufficient documentation

## 2016-09-30 DIAGNOSIS — J441 Chronic obstructive pulmonary disease with (acute) exacerbation: Secondary | ICD-10-CM | POA: Insufficient documentation

## 2016-09-30 DIAGNOSIS — F17210 Nicotine dependence, cigarettes, uncomplicated: Secondary | ICD-10-CM | POA: Insufficient documentation

## 2016-09-30 DIAGNOSIS — E119 Type 2 diabetes mellitus without complications: Secondary | ICD-10-CM | POA: Diagnosis not present

## 2016-09-30 DIAGNOSIS — Z7984 Long term (current) use of oral hypoglycemic drugs: Secondary | ICD-10-CM | POA: Insufficient documentation

## 2016-09-30 DIAGNOSIS — I10 Essential (primary) hypertension: Secondary | ICD-10-CM | POA: Diagnosis not present

## 2016-09-30 DIAGNOSIS — R079 Chest pain, unspecified: Secondary | ICD-10-CM

## 2016-09-30 LAB — CBC
HCT: 41.3 % (ref 36.0–46.0)
Hemoglobin: 13.7 g/dL (ref 12.0–15.0)
MCH: 29.7 pg (ref 26.0–34.0)
MCHC: 33.2 g/dL (ref 30.0–36.0)
MCV: 89.4 fL (ref 78.0–100.0)
PLATELETS: 397 10*3/uL (ref 150–400)
RBC: 4.62 MIL/uL (ref 3.87–5.11)
RDW: 13 % (ref 11.5–15.5)
WBC: 11.4 10*3/uL — ABNORMAL HIGH (ref 4.0–10.5)

## 2016-09-30 LAB — BASIC METABOLIC PANEL
Anion gap: 11 (ref 5–15)
BUN: 18 mg/dL (ref 6–20)
CALCIUM: 9.3 mg/dL (ref 8.9–10.3)
CO2: 23 mmol/L (ref 22–32)
CREATININE: 1.43 mg/dL — AB (ref 0.44–1.00)
Chloride: 104 mmol/L (ref 101–111)
GFR calc non Af Amer: 36 mL/min — ABNORMAL LOW (ref >60)
GFR, EST AFRICAN AMERICAN: 42 mL/min — AB (ref >60)
Glucose, Bld: 234 mg/dL — ABNORMAL HIGH (ref 65–99)
Potassium: 4.1 mmol/L (ref 3.5–5.1)
Sodium: 138 mmol/L (ref 135–145)

## 2016-09-30 LAB — I-STAT TROPONIN, ED
TROPONIN I, POC: 0.01 ng/mL (ref 0.00–0.08)
Troponin i, poc: 0 ng/mL (ref 0.00–0.08)

## 2016-09-30 NOTE — ED Triage Notes (Signed)
Pt. reports palpitations with central chest tightness / SOB onset this evening .

## 2016-09-30 NOTE — ED Provider Notes (Signed)
[facility] DEPT Provider Note   CSN: [id number] Arrival date & time: 09/30/16  9767  By signing my name below, I, Jeanell Sparrow, attest that this documentation has been prepared under the direction and in the presence of Ripley Fraise, MD . Electronically Signed: Jeanell Sparrow, Scribe. 09/30/2016. 2:53 AM.  History   Chief Complaint Chief Complaint  Patient presents with  . Palpitations  . Chest Tightness   The history is provided by the patient. No language interpreter was used.  Palpitations   This is a recurrent problem. The current episode started 3 to 5 hours ago. The problem occurs constantly. The problem has been gradually improving. On average, each episode lasts 5 hours. Associated symptoms include chest pressure and shortness of breath. Pertinent negatives include no near-syncope, no syncope, no abdominal pain, no vomiting, no cough and no hemoptysis. Treatments tried: Metoprolol. The treatment provided moderate relief. Risk factors include diabetes mellitus.   HPI Comments: [name] is a 70 y.o. female who presents to the Emergency Department complaining of heart palpitations that started about 5 hours ago. She states she started having diaphoresis following her shower. She then started having associated chest tightness, SOB, and lightheadedness. She reports having a high blood pressure and heart rate reading at home so she came to the ED. She states that she took Metoprolol with some relief. She admits to having a hx of similar complaint with less severity. She denies any hx of MI, hx of PE, hx of AFib, LOC, vomiting, cough, hemoptysis, abdominal pain, or other complaints.     PCP: Birdie Riddle, MD  Past Medical History:  Diagnosis Date  . Arthritis   . Chronic back pain greater than 3 months duration   . Chronic neck pain   . Diabetes mellitus   . GERD (gastroesophageal reflux disease)   . H/O hiatal hernia   . Headache(784.0)   . Hemorrhoids   . Herniated  disc   . Hx of echocardiogram 2010   normal EF  . Hypertension   . Shortness of breath on exertion 11/10/11   "sometimes"  . Vertigo     Patient Active Problem List   Diagnosis Date Noted  . Acute exacerbation of chronic obstructive pulmonary disease (COPD) ([place]) 10/11/2015  . Dehydration 01/25/2015    Class: Acute  . Acute bronchitis due to infection 11/19/2014  . Orthostatic hypotension 06/18/2013  . Numbness and tingling in right hand 11/10/2011    Class: Acute  . Hypertension 11/10/2011  . Cervical disc disease 11/10/2011    Class: Chronic  . Headache 11/10/2011    Class: Acute  . DM II (diabetes mellitus, type II), controlled ([place]) 11/10/2011    Class: Chronic    Past Surgical History:  Procedure Laterality Date  . ABDOMINAL HYSTERECTOMY  1997?   "partial"  . CHOLECYSTECTOMY  ~ 2008  . FRACTURE SURGERY  2004?   right shoulder  . KNEE ARTHROSCOPY  2004   right  . KNEE SURGERY     right  . neck fusion    . SHOULDER OPEN ROTATOR CUFF REPAIR  07/20/2011   left  . TONSILLECTOMY      OB History    No data available       Home Medications    Prior to Admission medications   Medication Sig Start Date End Date Taking? Authorizing Provider  albuterol (PROVENTIL) (2.5 MG/3ML) 0.083% nebulizer solution USE 1 VIAL EVERY 6 HOURS AS NEEDED FOR SHORTNESS OF BREATH 10/15/15  Yes Historical  Provider, MD  ALPRAZolam Duanne Moron) 0.25 MG tablet Take 0.25 mg by mouth daily.  10/16/14  Yes Historical Provider, MD  amLODipine (NORVASC) 10 MG tablet Take 10 mg by mouth daily.   Yes Historical Provider, MD  Azilsartan-Chlorthalidone (EDARBYCLOR) 40-25 MG TABS Take 1 tablet by mouth daily.   Yes Historical Provider, MD  cloNIDine (CATAPRES) 0.1 MG tablet Take 0.1 mg by mouth 2 (two) times daily.    Yes Historical Provider, MD  gabapentin (NEURONTIN) 100 MG capsule Take 300 mg by mouth 3 (three) times daily. 09/14/16  Yes Historical Provider, MD  hydrALAZINE (APRESOLINE) 50 MG tablet  Take 50 mg by mouth every 8 (eight) hours.   Yes Historical Provider, MD  HYDROcodone-acetaminophen (NORCO/VICODIN) 5-325 MG tablet Take 1 tablet by mouth every 6 (six) hours as needed. Patient taking differently: Take 1 tablet by mouth every 6 (six) hours as needed for moderate pain.  09/12/16  Yes Montine Circle, PA-C  metFORMIN (GLUCOPHAGE) 500 MG tablet Take 500 mg by mouth 2 (two) times daily. 01/06/14  Yes Historical Provider, MD  metoprolol (LOPRESSOR) 50 MG tablet Take 50 mg by mouth 2 (two) times daily.   Yes Historical Provider, MD  NITROSTAT 0.4 MG SL tablet PLACE 1 TABLET UNDER TONGUE IF NEEDED FOR CHEST PAIN EVERY 5 MINUTES FOR MAX OF 3 DOSES 09/23/15  Yes Historical Provider, MD  potassium chloride (K-DUR) 10 MEQ tablet Take 20 mEq by mouth 2 (two) times daily.    Yes Historical Provider, MD  PROAIR HFA 108 (90 BASE) MCG/ACT inhaler INHALE 2 PUFFS EVERY 4 TO 6 HOURS AS NEEDED FOR SHORTNESS OF BREATH 09/23/15  Yes Historical Provider, MD  cyclobenzaprine (FLEXERIL) 10 MG tablet Take 1 tablet (10 mg total) by mouth 2 (two) times daily as needed for muscle spasms. Patient not taking: Reported on 09/30/2016 02/20/16   Margarita Mail, PA-C  guaiFENesin (MUCINEX) 600 MG 12 hr tablet Take 1 tablet (600 mg total) by mouth 2 (two) times daily. Patient not taking: Reported on 09/30/2016 10/15/15   Dixie Dials, MD  ketoconazole (NIZORAL) 2 % cream Apply 1 application daily to the external vaginal area. Patient not taking: Reported on 09/30/2016 02/20/16   Margarita Mail, PA-C  levofloxacin (LEVAQUIN) 500 MG tablet Take 1 tablet (500 mg total) by mouth daily. Patient not taking: Reported on 09/30/2016 10/16/15   Dixie Dials, MD  meclizine (ANTIVERT) 12.5 MG tablet Take 1 tablet (12.5 mg total) by mouth 2 (two) times daily. Patient not taking: Reported on 09/30/2016 10/15/15   Dixie Dials, MD  polyethylene glycol (MIRALAX / GLYCOLAX) packet Take 17 g by mouth daily. Patient not taking: Reported on  09/30/2016 10/15/15   Dixie Dials, MD  sodium chloride (OCEAN) 0.65 % SOLN nasal spray Place 1 spray into both nostrils as needed for congestion. Patient not taking: Reported on 09/30/2016 10/06/15   Ashley Murrain, NP  traMADol (ULTRAM) 50 MG tablet Take 1 tablet (50 mg total) by mouth every 6 (six) hours as needed. Patient not taking: Reported on 09/30/2016 05/03/16   Carlos Levering, PA-C    Family History No family history on file.  Social History Social History  Substance Use Topics  . Smoking status: Current Some Day Smoker    Types: Cigarettes  . Smokeless tobacco: Never Used  . Alcohol use No     Allergies   Oxycodone   Review of Systems Review of Systems  Respiratory: Positive for chest tightness and shortness of breath. Negative for cough  and hemoptysis.   Cardiovascular: Positive for palpitations. Negative for syncope and near-syncope.  Gastrointestinal: Negative for abdominal pain and vomiting.  Neurological: Negative for syncope and light-headedness.  All other systems reviewed and are negative.    Physical Exam Updated Vital Signs BP 160/73 (BP Location: Right Arm)   Pulse 82   Temp 98 F (36.7 C) (Oral)   Resp 18   SpO2 100%   Physical Exam  Nursing note and vitals reviewed.  CONSTITUTIONAL: Well developed/well nourished HEAD: Normocephalic/atraumatic EYES: EOMI/PERRL ENMT: Mucous membranes moist NECK: supple no meningeal signs SPINE/BACK:entire spine nontender CV: S1/S2 noted, no murmurs/rubs/gallops noted LUNGS: Lungs are clear to auscultation bilaterally, no apparent distress ABDOMEN: soft, nontender, no rebound or guarding, bowel sounds noted throughout abdomen GU:no cva tenderness NEURO: Pt is awake/alert/appropriate, moves all extremitiesx4.  No facial droop.   EXTREMITIES: pulses normal/equal, full ROM SKIN: warm, color normal PSYCH: no abnormalities of mood noted, alert and oriented to situation  ED Treatments / Results    DIAGNOSTIC STUDIES: Oxygen Saturation is 100% on RA, normal by my interpretation.    COORDINATION OF CARE: 2:57 AM- Pt advised of plan for treatment and pt agrees.  Labs (all labs ordered are listed, but only abnormal results are displayed) Labs Reviewed  BASIC METABOLIC PANEL - Abnormal; Notable for the following:       Result Value   Glucose, Bld 234 (*)    Creatinine, Ser 1.43 (*)    GFR calc non Af Amer 36 (*)    GFR calc Af Amer 42 (*)    All other components within normal limits  CBC - Abnormal; Notable for the following:    WBC 11.4 (*)    All other components within normal limits  I-STAT TROPOININ, ED  I-STAT TROPOININ, ED    EKG  EKG Interpretation  Date/Time:  Wednesday September 30 2016 01:06:31 EST Ventricular Rate:  85 PR Interval:  186 QRS Duration: 84 QT Interval:  390 QTC Calculation: 464 R Axis:   -34 Text Interpretation:  Normal sinus rhythm Left axis deviation Septal infarct , age undetermined Abnormal ECG No significant change since last tracing Confirmed by Christy Gentles  MD, [place] ([id number]) on 09/30/2016 2:43:41 AM       Radiology Dg Chest 2 View  Result Date: 09/30/2016 CLINICAL DATA:  Tachycardia, dyspnea, chest tightness and palpitations EXAM: CHEST  2 VIEW COMPARISON:  05/03/2016 FINDINGS: The heart size and mediastinal contours are within normal limits. Mild aortic atherosclerosis involving the aortic arch without aneurysm. Both lungs are clear. Slightly tapered appearing distal right clavicle. This can be seen in inflammatory arthritis such as rheumatoid. Inferomedial humeral head spurring is seen bilaterally. This is likely related to osteoarthritis. The patient is status post cholecystectomy. No acute osseous abnormality of the dorsal spine. IMPRESSION: No active cardiopulmonary disease. Electronically Signed   By: Ashley Royalty M.D.   On: 09/30/2016 02:56    Procedures Procedures (including critical care time)  Medications Ordered in  ED Medications - No data to display   Initial Impression / Assessment and Plan / ED Course  I have reviewed the triage vital signs and the nursing notes.  Pertinent labs & imaging results that were available during my care of the patient were reviewed by me and considered in my medical decision making (see chart for details).  Clinical Course     Pt well appearing Now feeling improved No tachycardia here No dysrhythmia on EKG She reports all symptoms improved when she took metoprolol  She reports it first started as palpitations then she felt chest tightness 6:22 AM Pt stable She slept throughout ED stay She denies complaints Tele monitor reviewed, probable artifact but no dysrhythmia noted Will d/c home Suspect palpitations/tachycardia triggered her CP I doubt ACS/PE/Dissection at this time She will call her cardiologist dr Doylene Canard today for followup    Final Clinical Impressions(s) / ED Diagnoses   Final diagnoses:  Palpitations  Chest pain, unspecified type    New Prescriptions New Prescriptions   No medications on file   I personally performed the services described in this documentation, which was scribed in my presence. The recorded information has been reviewed and is accurate.        Ripley Fraise, MD 09/30/16 [phone]

## 2016-11-27 ENCOUNTER — Other Ambulatory Visit: Payer: Self-pay | Admitting: Neurosurgery

## 2016-11-27 DIAGNOSIS — M47812 Spondylosis without myelopathy or radiculopathy, cervical region: Secondary | ICD-10-CM

## 2016-12-06 ENCOUNTER — Ambulatory Visit
Admission: RE | Admit: 2016-12-06 | Discharge: 2016-12-06 | Disposition: A | Discharge status: Home / Self Care | Payer: Medicare HMO | Source: Ambulatory Visit | Attending: Neurosurgery | Admitting: Neurosurgery

## 2016-12-06 DIAGNOSIS — M47812 Spondylosis without myelopathy or radiculopathy, cervical region: Secondary | ICD-10-CM

## 2016-12-14 ENCOUNTER — Other Ambulatory Visit: Payer: Self-pay | Admitting: Neurosurgery

## 2016-12-21 ENCOUNTER — Encounter (HOSPITAL_COMMUNITY)
Admission: RE | Admit: 2016-12-21 | Discharge: 2016-12-21 | Disposition: A | Discharge status: Home / Self Care | Payer: Medicare HMO | Source: Ambulatory Visit | Attending: Neurosurgery | Admitting: Neurosurgery

## 2016-12-21 ENCOUNTER — Encounter (HOSPITAL_COMMUNITY): Payer: Self-pay

## 2016-12-21 ENCOUNTER — Other Ambulatory Visit (HOSPITAL_COMMUNITY): Payer: Self-pay | Admitting: *Deleted

## 2016-12-21 DIAGNOSIS — M4720 Other spondylosis with radiculopathy, site unspecified: Secondary | ICD-10-CM | POA: Diagnosis not present

## 2016-12-21 DIAGNOSIS — Z01812 Encounter for preprocedural laboratory examination: Secondary | ICD-10-CM | POA: Diagnosis present

## 2016-12-21 HISTORY — DX: Anxiety disorder, unspecified: F41.9

## 2016-12-21 HISTORY — DX: Personal history of urinary calculi: Z87.442

## 2016-12-21 HISTORY — DX: Chronic obstructive pulmonary disease, unspecified: J44.9

## 2016-12-21 HISTORY — DX: Palpitations: R00.2

## 2016-12-21 LAB — TYPE AND SCREEN
ABO/RH(D): A POS
Antibody Screen: NEGATIVE

## 2016-12-21 LAB — BASIC METABOLIC PANEL
ANION GAP: 9 (ref 5–15)
BUN: 14 mg/dL (ref 6–20)
CO2: 28 mmol/L (ref 22–32)
Calcium: 9.5 mg/dL (ref 8.9–10.3)
Chloride: 98 mmol/L — ABNORMAL LOW (ref 101–111)
Creatinine, Ser: 1.14 mg/dL — ABNORMAL HIGH (ref 0.44–1.00)
GFR calc Af Amer: 56 mL/min — ABNORMAL LOW (ref >60)
GFR, EST NON AFRICAN AMERICAN: 48 mL/min — AB (ref >60)
Glucose, Bld: 112 mg/dL — ABNORMAL HIGH (ref 65–99)
POTASSIUM: 3.7 mmol/L (ref 3.5–5.1)
SODIUM: 135 mmol/L (ref 135–145)

## 2016-12-21 LAB — CBC
HEMATOCRIT: 40.3 % (ref 36.0–46.0)
HEMOGLOBIN: 13.4 g/dL (ref 12.0–15.0)
MCH: 29.6 pg (ref 26.0–34.0)
MCHC: 33.3 g/dL (ref 30.0–36.0)
MCV: 89.2 fL (ref 78.0–100.0)
Platelets: 351 10*3/uL (ref 150–400)
RBC: 4.52 MIL/uL (ref 3.87–5.11)
RDW: 12.9 % (ref 11.5–15.5)
WBC: 9.9 10*3/uL (ref 4.0–10.5)

## 2016-12-21 LAB — ABO/RH: ABO/RH(D): A POS

## 2016-12-21 LAB — GLUCOSE, CAPILLARY: Glucose-Capillary: 129 mg/dL — ABNORMAL HIGH (ref 65–99)

## 2016-12-21 LAB — SURGICAL PCR SCREEN
MRSA, PCR: NEGATIVE
Staphylococcus aureus: NEGATIVE

## 2016-12-21 NOTE — Progress Notes (Signed)
PCP/Cardiologist: Dr. Greg Cutter clearance notes/ekg/stress/echo/  Fasting sugars: 120

## 2016-12-21 NOTE — Pre-Procedure Instructions (Signed)
[name]  12/21/2016      [id number] [place] [id number]: [phone]: [phone]   Your procedure is scheduled on   Friday  12/25/16  [facility]  Call this number if you have problems the morning of surgery:  [phone]   Remember:  Do not eat food or drink liquids after midnight Thurs. Feb. 1    Take these medicines the morning of surgery with A SIP OF WATER: albuterol inhaler if needed-bring to hospital, alprazolan (xanax), amlodipine (norvasc),clonidine (catapress) if needed, cyclobenzaprine (flexeril) if needed, hydralazine (apresoline), metoprolol (lopressor) if needed, proair if needed -bring to hospital    How to Manage Your Diabetes Before and After Surgery  Why is it important to control my blood sugar before and after surgery? . Improving blood sugar levels before and after surgery helps healing and can limit problems. . A way of improving blood sugar control is eating a healthy diet by: o  Eating less sugar and carbohydrates o  Increasing activity/exercise o  Talking with your doctor about reaching your blood sugar goals . High blood sugars (greater than 180 mg/dL) can raise your risk of infections and slow your recovery, so you will need to focus on controlling your diabetes during the weeks before surgery. . Make sure that the doctor who takes care of your diabetes knows about your planned surgery including the date and location.  How do I manage my blood sugar before surgery? . Check your blood sugar at least 4 times a day, starting 2 days before surgery, to make sure that the level is not too high or low. o Check your blood sugar the morning of your surgery when you wake up and every 2 hours until you get to the Short Stay unit. . If your blood sugar is less than 70 mg/dL, you will need to  treat for low blood sugar: o Do not take insulin. o Treat a low blood sugar (less than 70 mg/dL) with  cup of clear juice (cranberry or apple), 4 glucose tablets, OR glucose gel. o Recheck blood sugar in 15 minutes after treatment (to make sure it is greater than 70 mg/dL). If your blood sugar is not greater than 70 mg/dL on recheck, call [phone] further instructions. . Report your blood sugar to the short stay nurse when you get to Short Stay.  . If you are admitted to the hospital after surgery: o Your blood sugar will be checked by the staff and you will probably be given insulin after surgery (instead of oral diabetes medicines) to make sure you have good blood sugar levels. o The goal for blood sugar control after surgery is 80-180 mg/dL.              WHAT DO I DO ABOUT MY DIABETES MEDICATION?   .Marland KitchenDo not take oral diabetes medicines (pills) the morning of surgery.        Do not wear jewelry, make-up or nail polish.  Do not wear lotions, powders, or perfumes, or deoderant.  Do not shave 48 hours prior to surgery.  Men Telissa shave face and neck.  Do not bring valuables to the hospital.  [facility] responsible for any belongings or valuables.  Contacts, dentures or bridgework Stormi not be worn into surgery.  Leave your suitcase  in the car.  After surgery it Vianka be brought to your room.  For patients admitted to the hospital, discharge time will be determined by your treatment team.  Patients discharged the day of surgery will not be allowed to drive home.   Name and phone number of your driver:   Special instructions:  [place] - Preparing for Surgery  Before surgery, you can play an important role.  Because skin is not sterile, your skin needs to be as free of germs as possible.  You can reduce the number of germs on you skin by washing with CHG (chlorahexidine gluconate) soap before surgery.  CHG is an antiseptic cleaner which kills germs and bonds with  the skin to continue killing germs even after washing.  Please DO NOT use if you have an allergy to CHG or antibacterial soaps.  If your skin becomes reddened/irritated stop using the CHG and inform your nurse when you arrive at Short Stay.  Do not shave (including legs and underarms) for at least 48 hours prior to the first CHG shower.  You Shenetta shave your face.  Please follow these instructions carefully:   1.  Shower with CHG Soap the night before surgery and the                                morning of Surgery.  2.  If you choose to wash your hair, wash your hair first as usual with your       normal shampoo.  3.  After you shampoo, rinse your hair and body thoroughly to remove the                      Shampoo.  4.  Use CHG as you would any other liquid soap.  You can apply chg directly       to the skin and wash gently with scrungie or a clean washcloth.  5.  Apply the CHG Soap to your body ONLY FROM THE NECK DOWN.        Do not use on open wounds or open sores.  Avoid contact with your eyes,       ears, mouth and genitals (private parts).  Wash genitals (private parts)       with your normal soap.  6.  Wash thoroughly, paying special attention to the area where your surgery        will be performed.  7.  Thoroughly rinse your body with warm water from the neck down.  8.  DO NOT shower/wash with your normal soap after using and rinsing of  the CHG Soap.  9.  Pat yourself dry with a clean towel.            10.  Wear clean pajamas.            11.  Place clean sheets on your bed the night of your first shower and do not  sleep with pets.  Day of Surgery  Do not apply any lotions/deoderants the morning of surgery.  Please wear clean clothes to the hospital/surgery center.    Please read over the following fact sheets that you were given. MRSA Information and Surgical Site Infection Prevention

## 2016-12-22 LAB — HEMOGLOBIN A1C
HEMOGLOBIN A1C: 6.8 % — AB (ref 4.8–5.6)
Mean Plasma Glucose: 148 mg/dL

## 2016-12-22 NOTE — Progress Notes (Addendum)
Anesthesia Chart Review: Patient is a 71 year old female scheduled for C4-5 ACDF on 12/25/16 by Dr. Christella Noa.  History includes smoking, DM2, HTN, GERD, hiatal hernia, vertigo, COPD, anxiety, palpitations, cholecystectomy, hysterectomy, cervical fusion. She had an ED visit for palpitations and chest pain on 09/30/16. No dysrhythmia noted on telemetry. Troponin negative. She was discharge with follow-up with her cardiologist/PCP [name]. Patient reports she was seen by [name] within the past few weeks.    BP (!) 154/62   Pulse 89   Temp 36.4 C   Resp 20   Ht '5\' 7"'$  (1.702 m)   Wt 223 lb (101.2 kg)   SpO2 99%   BMI 34.93 kg/m   EKG 09/30/16: NSR, LAD, septal infarct (age undetermined).   Echo 01/01/09:SUMMARY - Overall left ventricular systolic function was normal. Left    ventricular ejection fraction was estimated to be 65 %. There    were no left ventricular regional wall motion abnormalities.    Left ventricular wall thickness was mildly increased. There    was an increased relative contribution of atrial contraction    to left ventricular filling. - The left coronary cusp of aortic valve was mildly calcified.    There was trivial aortic valvular regurgitation.  Nuclear stress test 09/28/07: IMPRESSION:  1. Normal examination without evidence of pharmacologically induced myocardial ischemia.  2. Calculated left ventricular ejection fraction of 69%.   CXR 09/30/16: IMPRESSION: - No active cardiopulmonary disease.  - MRI C-spine 12/06/16: IMPRESSION: Potentially symptomatic left-sided neural impingement at C4-5 where a central extrusion in conjunction with asymmetric left-sided facet arthropathy and uncinate spurring contributes to LEFT C5 foraminal narrowing. - Potentially symptomatic left-sided neural impingement at C6-7 where a central and leftward protrusion without significant uncinate spurring results in LEFT C7 foraminal narrowing. 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 - Solid spontaneous arthrodesis across the RIGHT C3-4 facet joint.  Preoperative labs noted. Cr 1.14, glucose 112. CBC WNL. A1c 6.8. T&S done. Home fasting CBG 120.   Chart will be left for follow-up regarding status of cardiac clearance from [name].  [name] Phone [phone] 12/22/2016 4:38 PM   Addendum:  Pt saw [name] 12/22/16, echo performed (results below). Note indicates [name] is aware of upcoming surgery  Echo 12/22/16 [facility]): Normal LV systolic function and mild diastolic dysfunction. EF 55-60%. Mild MR.  If no changes, I anticipate pt can proceed with surgery as scheduled.   Kirsten Cass, FNP-BC [facility] Short Stay Surgical [facility] Phone: [phone] 12/24/2016 2:06 PM

## 2016-12-24 NOTE — Progress Notes (Signed)
Dr Doylene Canard office called and asked to fax clearance note. States they will fax it over.

## 2016-12-24 NOTE — Progress Notes (Signed)
Dr. Merrilee Jansky office called regarding Cardiac Clearance. The information we received from his office does not state if she is cleared for surgery.Nurse in office states that Dr. Johnell Comings is at hospital at this time,but she will talk with him when he gets to the office and fax a note over to [place].

## 2016-12-25 ENCOUNTER — Inpatient Hospital Stay (HOSPITAL_COMMUNITY): Payer: Medicare HMO

## 2016-12-25 ENCOUNTER — Encounter (HOSPITAL_COMMUNITY)
Admission: RE | Disposition: A | Discharge status: Home / Self Care | Payer: Self-pay | Source: Ambulatory Visit | Attending: Neurosurgery

## 2016-12-25 ENCOUNTER — Inpatient Hospital Stay (HOSPITAL_COMMUNITY): Payer: Medicare HMO | Admitting: Vascular Surgery

## 2016-12-25 ENCOUNTER — Ambulatory Visit (HOSPITAL_COMMUNITY)
Admission: RE | Admit: 2016-12-25 | Discharge: 2016-12-26 | Disposition: A | Discharge status: Home / Self Care | Payer: Medicare HMO | Source: Ambulatory Visit | Attending: Neurosurgery | Admitting: Neurosurgery

## 2016-12-25 ENCOUNTER — Inpatient Hospital Stay (HOSPITAL_COMMUNITY): Payer: Medicare HMO | Admitting: Anesthesiology

## 2016-12-25 DIAGNOSIS — F419 Anxiety disorder, unspecified: Secondary | ICD-10-CM | POA: Diagnosis not present

## 2016-12-25 DIAGNOSIS — K449 Diaphragmatic hernia without obstruction or gangrene: Secondary | ICD-10-CM | POA: Insufficient documentation

## 2016-12-25 DIAGNOSIS — Z6834 Body mass index (BMI) 34.0-34.9, adult: Secondary | ICD-10-CM | POA: Diagnosis not present

## 2016-12-25 DIAGNOSIS — I10 Essential (primary) hypertension: Secondary | ICD-10-CM | POA: Diagnosis not present

## 2016-12-25 DIAGNOSIS — Z7984 Long term (current) use of oral hypoglycemic drugs: Secondary | ICD-10-CM | POA: Diagnosis not present

## 2016-12-25 DIAGNOSIS — F17200 Nicotine dependence, unspecified, uncomplicated: Secondary | ICD-10-CM | POA: Diagnosis not present

## 2016-12-25 DIAGNOSIS — M4722 Other spondylosis with radiculopathy, cervical region: Secondary | ICD-10-CM | POA: Diagnosis present

## 2016-12-25 DIAGNOSIS — K219 Gastro-esophageal reflux disease without esophagitis: Secondary | ICD-10-CM | POA: Diagnosis not present

## 2016-12-25 DIAGNOSIS — J449 Chronic obstructive pulmonary disease, unspecified: Secondary | ICD-10-CM | POA: Diagnosis not present

## 2016-12-25 DIAGNOSIS — E119 Type 2 diabetes mellitus without complications: Secondary | ICD-10-CM | POA: Diagnosis not present

## 2016-12-25 DIAGNOSIS — Z885 Allergy status to narcotic agent status: Secondary | ICD-10-CM | POA: Diagnosis not present

## 2016-12-25 DIAGNOSIS — Z419 Encounter for procedure for purposes other than remedying health state, unspecified: Secondary | ICD-10-CM

## 2016-12-25 HISTORY — PX: ANTERIOR CERVICAL DECOMP/DISCECTOMY FUSION: SHX1161

## 2016-12-25 LAB — GLUCOSE, CAPILLARY
GLUCOSE-CAPILLARY: 120 mg/dL — AB (ref 65–99)
Glucose-Capillary: 124 mg/dL — ABNORMAL HIGH (ref 65–99)
Glucose-Capillary: 118 mg/dL — ABNORMAL HIGH (ref 65–99)
Glucose-Capillary: 177 mg/dL — ABNORMAL HIGH (ref 65–99)
Glucose-Capillary: 283 mg/dL — ABNORMAL HIGH (ref 65–99)

## 2016-12-25 SURGERY — ANTERIOR CERVICAL DECOMPRESSION/DISCECTOMY FUSION 1 LEVEL
Anesthesia: General

## 2016-12-25 MED ORDER — HYDRALAZINE HCL 50 MG PO TABS
50.0000 mg | ORAL_TABLET | Freq: Three times a day (TID) | ORAL | Status: DC
  Administered 2016-12-25: 50 mg via ORAL
  Filled 2016-12-25 (×3): qty 1

## 2016-12-25 MED ORDER — FENTANYL CITRATE (PF) 100 MCG/2ML IJ SOLN
INTRAMUSCULAR | Status: AC
  Filled 2016-12-25: qty 4

## 2016-12-25 MED ORDER — HYDROCODONE-ACETAMINOPHEN 7.5-325 MG PO TABS: 1.0000 | ORAL_TABLET | Freq: Once | ORAL | Status: DC | PRN

## 2016-12-25 MED ORDER — ACETAMINOPHEN 650 MG RE SUPP
650.0000 mg | RECTAL | Status: DC | PRN
Start: 2016-12-25 — End: 2016-12-26

## 2016-12-25 MED ORDER — MIDAZOLAM HCL 2 MG/2ML IJ SOLN
INTRAMUSCULAR | Status: AC
  Filled 2016-12-25: qty 2

## 2016-12-25 MED ORDER — INSULIN ASPART 100 UNIT/ML ~~LOC~~ SOLN
0.0000 [IU] | Freq: Every day | SUBCUTANEOUS | Status: DC
Start: 2016-12-25 — End: 2016-12-26
  Administered 2016-12-25: 3 [IU] via SUBCUTANEOUS

## 2016-12-25 MED ORDER — HYDROMORPHONE HCL 1 MG/ML IJ SOLN
INTRAMUSCULAR | Status: AC
  Filled 2016-12-25: qty 1

## 2016-12-25 MED ORDER — ALBUTEROL SULFATE (2.5 MG/3ML) 0.083% IN NEBU: 2.5000 mg | INHALATION_SOLUTION | Freq: Four times a day (QID) | RESPIRATORY_TRACT | Status: DC | PRN

## 2016-12-25 MED ORDER — CYCLOBENZAPRINE HCL 10 MG PO TABS: 10.0000 mg | ORAL_TABLET | Freq: Every evening | ORAL | Status: DC | PRN

## 2016-12-25 MED ORDER — DIAZEPAM 5 MG PO TABS
5.0000 mg | ORAL_TABLET | Freq: Four times a day (QID) | ORAL | Status: DC | PRN
  Administered 2016-12-25 – 2016-12-26 (×2): 5 mg via ORAL
  Filled 2016-12-25 (×2): qty 1

## 2016-12-25 MED ORDER — ONDANSETRON HCL 4 MG/2ML IJ SOLN: 4.0000 mg | INTRAMUSCULAR | Status: DC | PRN

## 2016-12-25 MED ORDER — ONDANSETRON HCL 4 MG/2ML IJ SOLN
INTRAMUSCULAR | Status: AC
  Filled 2016-12-25: qty 2

## 2016-12-25 MED ORDER — 0.9 % SODIUM CHLORIDE (POUR BTL) OPTIME
TOPICAL | Status: DC | PRN
  Administered 2016-12-25: 1000 mL

## 2016-12-25 MED ORDER — ROCURONIUM BROMIDE 100 MG/10ML IV SOLN
INTRAVENOUS | Status: DC | PRN
  Administered 2016-12-25: 50 mg via INTRAVENOUS

## 2016-12-25 MED ORDER — PHENOL 1.4 % MT LIQD
1.0000 | OROMUCOSAL | Status: DC | PRN
  Filled 2016-12-25: qty 177

## 2016-12-25 MED ORDER — CHLORHEXIDINE GLUCONATE CLOTH 2 % EX PADS: 6.0000 | MEDICATED_PAD | Freq: Once | CUTANEOUS | Status: DC

## 2016-12-25 MED ORDER — AMLODIPINE BESYLATE 5 MG PO TABS
5.0000 mg | ORAL_TABLET | Freq: Two times a day (BID) | ORAL | Status: DC
  Administered 2016-12-25: 5 mg via ORAL
  Filled 2016-12-25 (×2): qty 1

## 2016-12-25 MED ORDER — ONDANSETRON HCL 4 MG/2ML IJ SOLN
INTRAMUSCULAR | Status: DC | PRN
  Administered 2016-12-25: 4 mg via INTRAVENOUS

## 2016-12-25 MED ORDER — INSULIN ASPART 100 UNIT/ML ~~LOC~~ SOLN
0.0000 [IU] | Freq: Three times a day (TID) | SUBCUTANEOUS | Status: DC
  Administered 2016-12-26: 7 [IU] via SUBCUTANEOUS

## 2016-12-25 MED ORDER — ROSUVASTATIN CALCIUM 5 MG PO TABS
10.0000 mg | ORAL_TABLET | Freq: Every day | ORAL | Status: DC
  Administered 2016-12-25: 10 mg via ORAL
  Filled 2016-12-25: qty 2

## 2016-12-25 MED ORDER — ZOLPIDEM TARTRATE 5 MG PO TABS: 5.0000 mg | ORAL_TABLET | Freq: Every evening | ORAL | Status: DC | PRN

## 2016-12-25 MED ORDER — HEMOSTATIC AGENTS (NO CHARGE) OPTIME
TOPICAL | Status: DC | PRN
  Administered 2016-12-25: 1 via TOPICAL

## 2016-12-25 MED ORDER — LIDOCAINE HCL (CARDIAC) 20 MG/ML IV SOLN
INTRAVENOUS | Status: DC | PRN
  Administered 2016-12-25: 80 mg via INTRAVENOUS

## 2016-12-25 MED ORDER — METOPROLOL TARTRATE 25 MG PO TABS: 25.0000 mg | ORAL_TABLET | Freq: Two times a day (BID) | ORAL | Status: DC | PRN

## 2016-12-25 MED ORDER — ALBUTEROL SULFATE HFA 108 (90 BASE) MCG/ACT IN AERS: 2.0000 | INHALATION_SPRAY | RESPIRATORY_TRACT | Status: DC | PRN

## 2016-12-25 MED ORDER — PROPOFOL 10 MG/ML IV BOLUS
INTRAVENOUS | Status: AC
  Filled 2016-12-25: qty 20

## 2016-12-25 MED ORDER — MIDAZOLAM HCL 5 MG/5ML IJ SOLN
INTRAMUSCULAR | Status: DC | PRN
  Administered 2016-12-25: 2 mg via INTRAVENOUS

## 2016-12-25 MED ORDER — ALPRAZOLAM 0.25 MG PO TABS
0.2500 mg | ORAL_TABLET | Freq: Every day | ORAL | Status: DC
Start: 2016-12-26 — End: 2016-12-26
  Administered 2016-12-26: 0.25 mg via ORAL
  Filled 2016-12-25: qty 1

## 2016-12-25 MED ORDER — FENTANYL CITRATE (PF) 100 MCG/2ML IJ SOLN
INTRAMUSCULAR | Status: DC | PRN
  Administered 2016-12-25: 150 ug via INTRAVENOUS
  Administered 2016-12-25 (×2): 100 ug via INTRAVENOUS
  Administered 2016-12-25: 50 ug via INTRAVENOUS

## 2016-12-25 MED ORDER — ACETAMINOPHEN 325 MG PO TABS: 650.0000 mg | ORAL_TABLET | ORAL | Status: DC | PRN

## 2016-12-25 MED ORDER — SALINE SPRAY 0.65 % NA SOLN
1.0000 | NASAL | Status: DC | PRN
  Filled 2016-12-25: qty 44

## 2016-12-25 MED ORDER — HYDROMORPHONE HCL 1 MG/ML IJ SOLN
0.2500 mg | INTRAMUSCULAR | Status: DC | PRN
  Administered 2016-12-25 (×2): 0.5 mg via INTRAVENOUS

## 2016-12-25 MED ORDER — CEFAZOLIN SODIUM-DEXTROSE 2-4 GM/100ML-% IV SOLN
2.0000 g | INTRAVENOUS | Status: AC
  Administered 2016-12-25: 2 g via INTRAVENOUS
  Filled 2016-12-25: qty 100

## 2016-12-25 MED ORDER — PHENYLEPHRINE HCL 10 MG/ML IJ SOLN
INTRAMUSCULAR | Status: DC | PRN
  Administered 2016-12-25 (×2): 80 ug via INTRAVENOUS

## 2016-12-25 MED ORDER — LIDOCAINE 2% (20 MG/ML) 5 ML SYRINGE
INTRAMUSCULAR | Status: AC
  Filled 2016-12-25: qty 5

## 2016-12-25 MED ORDER — HYDROCODONE-ACETAMINOPHEN 5-325 MG PO TABS
1.0000 | ORAL_TABLET | ORAL | Status: DC | PRN
  Administered 2016-12-25 – 2016-12-26 (×4): 2 via ORAL
  Filled 2016-12-25 (×4): qty 2

## 2016-12-25 MED ORDER — LACTATED RINGERS IV SOLN
INTRAVENOUS | Status: DC
  Administered 2016-12-25 (×3): via INTRAVENOUS

## 2016-12-25 MED ORDER — SUGAMMADEX SODIUM 200 MG/2ML IV SOLN
INTRAVENOUS | Status: AC
  Filled 2016-12-25: qty 2

## 2016-12-25 MED ORDER — CLONIDINE HCL 0.1 MG PO TABS: 0.1000 mg | ORAL_TABLET | Freq: Two times a day (BID) | ORAL | Status: DC | PRN

## 2016-12-25 MED ORDER — DEXAMETHASONE SODIUM PHOSPHATE 10 MG/ML IJ SOLN
INTRAMUSCULAR | Status: DC | PRN
  Administered 2016-12-25: 10 mg via INTRAVENOUS

## 2016-12-25 MED ORDER — THROMBIN 5000 UNITS EX SOLR
CUTANEOUS | Status: AC
  Filled 2016-12-25: qty 10000

## 2016-12-25 MED ORDER — MENTHOL 3 MG MT LOZG: 1.0000 | LOZENGE | OROMUCOSAL | Status: DC | PRN

## 2016-12-25 MED ORDER — THROMBIN 5000 UNITS EX SOLR
CUTANEOUS | Status: DC | PRN
  Administered 2016-12-25 (×2): 5000 [IU] via TOPICAL

## 2016-12-25 MED ORDER — SUGAMMADEX SODIUM 200 MG/2ML IV SOLN
INTRAVENOUS | Status: DC | PRN
  Administered 2016-12-25: 200 mg via INTRAVENOUS

## 2016-12-25 MED ORDER — HYDROCODONE-ACETAMINOPHEN 5-325 MG PO TABS: 1.0000 | ORAL_TABLET | Freq: Four times a day (QID) | ORAL | 0 refills | Status: DC | PRN

## 2016-12-25 MED ORDER — TIZANIDINE HCL 4 MG PO TABS: 4.0000 mg | ORAL_TABLET | Freq: Four times a day (QID) | ORAL | 0 refills | Status: DC | PRN

## 2016-12-25 MED ORDER — LIDOCAINE-EPINEPHRINE (PF) 2 %-1:200000 IJ SOLN
INTRAMUSCULAR | Status: AC
  Filled 2016-12-25: qty 20

## 2016-12-25 MED ORDER — PROPOFOL 10 MG/ML IV BOLUS
INTRAVENOUS | Status: DC | PRN
  Administered 2016-12-25: 130 mg via INTRAVENOUS

## 2016-12-25 MED ORDER — NITROGLYCERIN 0.4 MG SL SUBL: 0.4000 mg | SUBLINGUAL_TABLET | SUBLINGUAL | Status: DC | PRN

## 2016-12-25 MED ORDER — LIDOCAINE-EPINEPHRINE (PF) 2 %-1:200000 IJ SOLN
INTRAMUSCULAR | Status: DC | PRN
  Administered 2016-12-25: 5 mL via INTRADERMAL

## 2016-12-25 MED ORDER — SODIUM CHLORIDE 0.9% FLUSH
3.0000 mL | Freq: Two times a day (BID) | INTRAVENOUS | Status: DC
Start: 2016-12-25 — End: 2016-12-26

## 2016-12-25 MED ORDER — METFORMIN HCL 500 MG PO TABS
500.0000 mg | ORAL_TABLET | Freq: Two times a day (BID) | ORAL | Status: DC
  Administered 2016-12-25 – 2016-12-26 (×2): 500 mg via ORAL
  Filled 2016-12-25 (×2): qty 1

## 2016-12-25 MED ORDER — POTASSIUM CHLORIDE IN NACL 20-0.9 MEQ/L-% IV SOLN: INTRAVENOUS | Status: DC

## 2016-12-25 MED ORDER — SODIUM CHLORIDE 0.9% FLUSH: 3.0000 mL | INTRAVENOUS | Status: DC | PRN

## 2016-12-25 SURGICAL SUPPLY — 71 items
ADH SKN CLS APL DERMABOND .7 (GAUZE/BANDAGES/DRESSINGS)
BLADE CLIPPER SURG (BLADE) IMPLANT
BNDG GAUZE ELAST 4 BULKY (GAUZE/BANDAGES/DRESSINGS) IMPLANT
BUR DRUM 4.0 (BURR) IMPLANT
BUR MATCHSTICK NEURO 3.0 LAGG (BURR) ×2 IMPLANT
CANISTER SUCT 3000ML PPV (MISCELLANEOUS) ×2 IMPLANT
CARTRIDGE OIL MAESTRO DRILL (MISCELLANEOUS) ×1 IMPLANT
DECANTER SPIKE VIAL GLASS SM (MISCELLANEOUS) ×2 IMPLANT
DERMABOND ADVANCED (GAUZE/BANDAGES/DRESSINGS)
DERMABOND ADVANCED .7 DNX12 (GAUZE/BANDAGES/DRESSINGS) ×1 IMPLANT
DIFFUSER DRILL AIR PNEUMATIC (MISCELLANEOUS) ×2 IMPLANT
DRAPE HALF SHEET 40X57 (DRAPES) IMPLANT
DRAPE LAPAROTOMY 100X72 PEDS (DRAPES) ×2 IMPLANT
DRAPE MICROSCOPE LEICA (MISCELLANEOUS) ×2 IMPLANT
DRAPE POUCH INSTRU U-SHP 10X18 (DRAPES) ×2 IMPLANT
DURAPREP 6ML APPLICATOR 50/CS (WOUND CARE) ×2 IMPLANT
ELECT COATED BLADE 2.86 ST (ELECTRODE) ×2 IMPLANT
ELECT REM PT RETURN 9FT ADLT (ELECTROSURGICAL) ×2
ELECTRODE REM PT RTRN 9FT ADLT (ELECTROSURGICAL) ×1 IMPLANT
GAUZE SPONGE 4X4 16PLY XRAY LF (GAUZE/BANDAGES/DRESSINGS) IMPLANT
GLOVE BIO SURGEON STRL SZ 6.5 (GLOVE) IMPLANT
GLOVE BIO SURGEON STRL SZ7 (GLOVE) IMPLANT
GLOVE BIO SURGEON STRL SZ7.5 (GLOVE) IMPLANT
GLOVE BIO SURGEON STRL SZ8 (GLOVE) IMPLANT
GLOVE BIO SURGEON STRL SZ8.5 (GLOVE) IMPLANT
GLOVE BIOGEL M 8.0 STRL (GLOVE) IMPLANT
GLOVE ECLIPSE 6.5 STRL STRAW (GLOVE) ×3 IMPLANT
GLOVE ECLIPSE 7.0 STRL STRAW (GLOVE) ×1 IMPLANT
GLOVE ECLIPSE 7.5 STRL STRAW (GLOVE) ×3 IMPLANT
GLOVE ECLIPSE 8.0 STRL XLNG CF (GLOVE) IMPLANT
GLOVE ECLIPSE 8.5 STRL (GLOVE) IMPLANT
GLOVE EXAM NITRILE LRG STRL (GLOVE) IMPLANT
GLOVE EXAM NITRILE XL STR (GLOVE) IMPLANT
GLOVE EXAM NITRILE XS STR PU (GLOVE) IMPLANT
GLOVE INDICATOR 6.5 STRL GRN (GLOVE) ×2 IMPLANT
GLOVE INDICATOR 7.0 STRL GRN (GLOVE) IMPLANT
GLOVE INDICATOR 7.5 STRL GRN (GLOVE) ×1 IMPLANT
GLOVE INDICATOR 8.0 STRL GRN (GLOVE) ×1 IMPLANT
GLOVE INDICATOR 8.5 STRL (GLOVE) IMPLANT
GLOVE OPTIFIT SS 8.0 STRL (GLOVE) IMPLANT
GLOVE SURG SS PI 6.5 STRL IVOR (GLOVE) IMPLANT
GOWN STRL REUS W/ TWL LRG LVL3 (GOWN DISPOSABLE) ×2 IMPLANT
GOWN STRL REUS W/ TWL XL LVL3 (GOWN DISPOSABLE) IMPLANT
GOWN STRL REUS W/TWL 2XL LVL3 (GOWN DISPOSABLE) ×1 IMPLANT
GOWN STRL REUS W/TWL LRG LVL3 (GOWN DISPOSABLE) ×4
GOWN STRL REUS W/TWL XL LVL3 (GOWN DISPOSABLE) ×2
KIT BASIN OR (CUSTOM PROCEDURE TRAY) ×2 IMPLANT
KIT ROOM TURNOVER OR (KITS) ×2 IMPLANT
LIQUID BAND (GAUZE/BANDAGES/DRESSINGS) ×1 IMPLANT
NDL HYPO 25X1 1.5 SAFETY (NEEDLE) ×1 IMPLANT
NDL SPNL 22GX3.5 QUINCKE BK (NEEDLE) ×1 IMPLANT
NEEDLE HYPO 25X1 1.5 SAFETY (NEEDLE) ×2 IMPLANT
NEEDLE SPNL 22GX3.5 QUINCKE BK (NEEDLE) ×2 IMPLANT
NS IRRIG 1000ML POUR BTL (IV SOLUTION) ×2 IMPLANT
OIL CARTRIDGE MAESTRO DRILL (MISCELLANEOUS) ×2
PACK LAMINECTOMY NEURO (CUSTOM PROCEDURE TRAY) ×2 IMPLANT
PAD ARMBOARD 7.5X6 YLW CONV (MISCELLANEOUS) ×6 IMPLANT
PIN DISTRACTION 14MM (PIN) ×4 IMPLANT
PLATE HELIX-R 24MM (Plate) ×1 IMPLANT
RUBBERBAND STERILE (MISCELLANEOUS) ×4 IMPLANT
SCREW 4.0X13 (Screw) IMPLANT
SCREW 4.0X13MM (Screw) ×4 IMPLANT
SPACER ACF PARALLEL 7MM (Bone Implant) ×1 IMPLANT
SPONGE INTESTINAL PEANUT (DISPOSABLE) ×2 IMPLANT
SPONGE SURGIFOAM ABS GEL SZ50 (HEMOSTASIS) ×2 IMPLANT
SUT VIC AB 0 CT1 27 (SUTURE) ×2
SUT VIC AB 0 CT1 27XBRD ANTBC (SUTURE) ×1 IMPLANT
SUT VIC AB 3-0 SH 8-18 (SUTURE) ×2 IMPLANT
TOWEL OR 17X24 6PK STRL BLUE (TOWEL DISPOSABLE) ×2 IMPLANT
TOWEL OR 17X26 10 PK STRL BLUE (TOWEL DISPOSABLE) ×2 IMPLANT
WATER STERILE IRR 1000ML POUR (IV SOLUTION) ×2 IMPLANT

## 2016-12-25 NOTE — Op Note (Signed)
12/25/2016  3:48 PM  PATIENT:  [name]  72 y.o. 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 TDadywas taken to the operating room, intubated, and placed under general anesthesia without difficulty. She was positioned supine with her head in slight extension on a horseshoe headrest. The neck was prepped and draped in a sterile manner. I infiltrated 4 cc's 1/2%lidocaine/1:200,000 strength epinephrine into the planned incision starting from the midline to the medial border of the left sternocleidomastoid muscle. I opened the incision with a 10 blade and dissected sharply through soft tissue to the platysma. I dissected in the plane superior to the platysma both rostrally and caudally. I then opened the platysma in a horizontal fashion with Metzenbaum scissors, and dissected in the inferior plane rostrally and caudally. With both blunt and sharp technique I created an avascular corridor to the cervical spine. I placed a spinal needle(s) in the disc space at 4/5 . I then reflected the longus colli from C4 to C5 and placed self retaining retractors. I opened the disc space(s) at 4/5 with a 15 blade. I removed disc with curettes, Kerrison punches, and the drill. Using the drill I removed osteophytes and prepared for the decompression.  I decompressed the spinal canal and the C5 root(s) with the drill, Kerrison punches, and the curettes. We used the  microscope to aid in microdissection. I removed the posterior longitudinal ligament to fully expose and decompress the thecal sac. I exposed the roots laterally taking down the 4/5 uncovertebral joints. With the decompression complete we moved on to the arthrodesis. We used the drill to level the surfaces of C4, and C5. I removed soft tissue to prepare the disc space and the bony surfaces. I measured the space and placed a 774mstructural allograft into the disc space.  We then placed the anterior instrumentation. I placed 2 screws in each vertebral body through the plate. I locked the screws into place. Intraoperative xray showed the graft, plate, and screws to be in good position. I irrigated the wound, achieved hemostasis, and closed the wound in layers. I approximated the platysma, and the subcuticular plane with vicryl sutures. I used Dermabond for a sterile dressing.   PLAN OF CARE: Admit for overnight observation  PATIENT DISPOSITION:  PACU - hemodynamically stable.   Delay start of Pharmacological VTE agent (>24hrs) due to surgical blood loss or risk of bleeding:  yes

## 2016-12-25 NOTE — Anesthesia Procedure Notes (Signed)
Procedure Name: Intubation Date/Time: 12/25/2016 1:47 PM Performed by: Greggory Stallion, Dajuana Palen L Pre-anesthesia Checklist: Patient identified, Emergency Drugs available, Suction available and Patient being monitored Patient Re-evaluated:Patient Re-evaluated prior to inductionOxygen Delivery Method: Circle System Utilized Preoxygenation: Pre-oxygenation with 100% oxygen Intubation Type: IV induction Ventilation: Mask ventilation without difficulty Laryngoscope Size: Mac and 4 Grade View: Grade II Tube type: Oral Tube size: 7.5 mm Number of attempts: 1 Airway Equipment and Method: Stylet Placement Confirmation: ETT inserted through vocal cords under direct vision,  positive ETCO2 and breath sounds checked- equal and bilateral Secured at: 22 cm Tube secured with: Tape Dental Injury: Teeth and Oropharynx as per pre-operative assessment

## 2016-12-25 NOTE — Anesthesia Preprocedure Evaluation (Signed)
Anesthesia Evaluation  Patient identified by MRN, date of birth, ID band Patient awake    Reviewed: Allergy & Precautions, NPO status , Patient's Chart, lab work & pertinent test results  History of Anesthesia Complications Negative for: history of anesthetic complications  Airway Mallampati: II  TM Distance: >3 FB Neck ROM: Full    Dental  (+) Partial Lower, Partial Upper   Pulmonary COPD, Current Smoker,    breath sounds clear to auscultation       Cardiovascular hypertension, Pt. on medications (-) angina(-) CHF  Rhythm:Regular     Neuro/Psych  Headaches, Anxiety    GI/Hepatic hiatal hernia, GERD  ,  Endo/Other  diabetes, Type 2, Oral Hypoglycemic AgentsMorbid obesity  Renal/GU      Musculoskeletal  (+) Arthritis ,   Abdominal   Peds  Hematology   Anesthesia Other Findings   Reproductive/Obstetrics                             Anesthesia Physical Anesthesia Plan  ASA: III  Anesthesia Plan: General   Post-op Pain Management:    Induction: Intravenous  Airway Management Planned: Oral ETT  Additional Equipment: None  Intra-op Plan:   Post-operative Plan: Extubation in OR  Informed Consent: I have reviewed the patients History and Physical, chart, labs and discussed the procedure including the risks, benefits and alternatives for the proposed anesthesia with the patient or authorized representative who has indicated his/her understanding and acceptance.   Dental advisory given  Plan Discussed with: CRNA and Surgeon  Anesthesia Plan Comments:         Anesthesia Quick Evaluation

## 2016-12-25 NOTE — H&P (Signed)
BP (!) 163/65   Pulse 90   Temp 97.6 F (36.4 C) (Oral)   Resp 20   Wt 101.2 kg (223 lb)   SpO2 100%   BMI 34.93 kg/m  [name] is a long time patient of mine. 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 She would not allow me to do any kind of plating, but she has done better and the pain that she had from that operation which was done in 1999, I believe, has actually done well. She presents today for a four-week history of severe pain in the neck and severe headaches. She says she has swelling in the hands and feet.  PAST SURGICAL HISTORY: She has had shoulder surgery in the past.  DATA: Plain x-rays of the cervical spine show significant facet arthropathy at C4-5 on the left side. This is confirmed by a previous 2014 MRI of the cervical spine, which showed the facet at 4-5 to be quite enlarged and foraminal narrowing present there also.  INTERVAL PFSH: Mother is 82, in good health. Has hypertension.  ALLERGIES: SHE HAS AN ALLERGY TO OXYCODONE.  MEDICATIONS: She takes Metformin, Amlodipine, Hydralazine, Alprazolam, Rosuvastatin, and Cyclobenzaprine.  REVIEW OF SYSTEMS: She is reporting a three-week history of neck pain. She said the pain just started, no antecedent trauma. If she turns her head just the right way, she will have severe pain in the neck. She says the icepack and medication will relieve the pain. She is also wearing a soft cervical collar. She says that she cannot bend to turn without her neck hurting. Weakness in the arms and legs, with some tingling in the arms, feet, and hands and very bad headaches. Review of systems positive for glaucoma, tinnitus, sinus problems, sore throat, chest pain, irregular pulse, swelling in the feet and hands, leg pain with walking, occasional nausea, abdominal pain, urinary tract infections, arthritis, neck pain, arm weakness, leg weakness, back pain, arm pain, leg pain, joint pain, blurred vision, inhalant  allergies, anxiety, and excessive thirst.  PHYSICAL EXAMINATION: She is 5 feet 7 inches. She weighs 218 pounds,. On exam, she is alert, oriented x4, and answering all questions appropriately. Memory, language, attention span, and fund of knowledge are normal. Speech is clear, it is also fluent. She has 5/5 strength in the upper and lower extremities. She has good strength in the upper and lower extremities. Gait is normal. Romberg is negative. Pupils equal, round, and reactive to light. Speech is clear and fluent. Tongue and uvula are in the midline. Shoulder shrug is normal.   [name] returns today. She underwent two injections to the left C4-5 facet. She had significant osteoarthritis in the facet joint and left-sided pain in and around her shoulder. She did not improve. She returns today stating that she is getting worse and at this point she would proceed with an operation. At this point in time she would like to proceed with an ACDF at C4-5. 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 But the foraminal narrowing on the left was present at 4-5. She has something at 7-1, but she does not have any pain whatsoever that is beyond the elbow. Having had the surgery she understands the operation.

## 2016-12-25 NOTE — Anesthesia Postprocedure Evaluation (Addendum)
Anesthesia Post Note  Patient: [name]  Procedure(s) Performed: Procedure(s) (LRB): ANTERIOR CERVICAL DECOMPRESSION/DISCECTOMY FUSION CERVICAL FOUR CERVICAL FIVE (N/A)  Patient location during evaluation: PACU Anesthesia Type: General Level of consciousness: awake and alert Pain management: pain level controlled Vital Signs Assessment: post-procedure vital signs reviewed and stable Respiratory status: spontaneous breathing, nonlabored ventilation, respiratory function stable and patient connected to nasal cannula oxygen Cardiovascular status: blood pressure returned to baseline and stable Postop Assessment: no signs of nausea or vomiting Anesthetic complications: no       Last Vitals:  Vitals:   12/25/16 1630 12/25/16 1700  BP: 134/66 138/73  Pulse:  79  Resp:  18  Temp: 36.6 C 36.4 C    Last Pain:  Vitals:   12/25/16 1630  TempSrc:   PainSc: Asleep                 Tomeeka Plaugher

## 2016-12-25 NOTE — Discharge Summary (Signed)
Physician Discharge Summary  Patient ID: [name] MRN: [id number] DOB/AGE: [birth year] 70 y.o.  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 Kubota was admitted and taken to the operating room for an uncomplicated ACDF at H0/3. Post op she is voiding, ambulating and tolerating a regular diet. She is moving all extremities well. Her wound is clean, dry, and without signs of infection at discharge. Her voice is strong.  Treatments: surgery: Anterior Cervical decompression C4/5 Arthrodesis C4-5 with 46m structural allograft Anterior instrumentation(Nuvasive helix r) C4/5   Discharge Exam: Blood pressure 138/73, pulse 79, temperature 97.5 F (36.4 C), resp. rate 18, weight 101.2 kg (223 lb), SpO2 100 %. General appearance: alert, cooperative and appears stated age Neurologic: Alert and oriented X 3, normal strength and tone. Normal symmetric reflexes. Normal coordination and gait  Disposition: 01-Home or Self Care OSTEOARTHRITS OF SPINE WITH RADICULOPATHY  Allergies as of 12/25/2016      Reactions   Oxycodone Itching      Medication List    TAKE these medications   ALPRAZolam 0.25 MG tablet Commonly known as:  XANAX Take 0.25 mg by mouth daily.   amLODipine 5 MG tablet Commonly known as:  NORVASC Take 5 mg by mouth 2 (two) times daily.   cloNIDine 0.1 MG tablet Commonly known as:  CATAPRES Take 0.1 mg by mouth 2 (two) times daily as needed (high blood pressure).   cyclobenzaprine 10 MG tablet Commonly known as:  FLEXERIL Take 10 mg by mouth at bedtime as needed for muscle spasms.   hydrALAZINE 50 MG tablet Commonly known as:  APRESOLINE Take 50 mg by mouth 3 (three) times daily.   HYDROcodone-acetaminophen 5-325 MG tablet Commonly  known as:  NORCO/VICODIN Take 1 tablet by mouth every 6 (six) hours as needed. What changed:  Another medication with the same name was added. Make sure you understand how and when to take each.   HYDROcodone-acetaminophen 5-325 MG tablet Commonly known as:  NORCO/VICODIN Take 1 tablet by mouth every 6 (six) hours as needed for moderate pain. What changed:  You were already taking a medication with the same name, and this prescription was added. Make sure you understand how and when to take each.   metFORMIN 500 MG tablet Commonly known as:  GLUCOPHAGE Take 500 mg by mouth 2 (two) times daily.   metoprolol 50 MG tablet Commonly known as:  LOPRESSOR Take 25 mg by mouth 2 (two) times daily as needed (for palpitation).   NITROSTAT 0.4 MG SL tablet Generic drug:  nitroGLYCERIN PLACE 1 TABLET UNDER TONGUE IF NEEDED FOR CHEST PAIN EVERY 5 MINUTES FOR MAX OF 3 DOSES   PROAIR HFA 108 (90 Base) MCG/ACT inhaler Generic drug:  albuterol INHALE 2 PUFFS EVERY 4 TO 6 HOURS AS NEEDED FOR SHORTNESS OF BREATH   albuterol (2.5 MG/3ML) 0.083% nebulizer solution Commonly known as:  PROVENTIL USE 1 VIAL EVERY 6 HOURS AS NEEDED FOR SHORTNESS OF BREATH   rosuvastatin 10 MG tablet Commonly known as:  CRESTOR Take 10 mg by mouth daily.   sodium chloride 0.65 % Soln nasal spray Commonly known as:  OCEAN Place 1 spray into both nostrils as needed for congestion.   tiZANidine 4 MG tablet Commonly known as:  ZANAFLEX Take 1 tablet (4 mg total) by mouth every 6 (six) hours  as needed for muscle spasms.   traMADol 50 MG tablet Commonly known as:  ULTRAM Take 1 tablet (50 mg total) by mouth every 6 (six) hours as needed.        Signed: Evianna Chandran L 12/25/2016, 5:23 PM

## 2016-12-25 NOTE — Transfer of Care (Signed)
Immediate Anesthesia Transfer of Care Note  Patient: [name]  Procedure(s) Performed: Procedure(s): ANTERIOR CERVICAL DECOMPRESSION/DISCECTOMY FUSION CERVICAL FOUR CERVICAL FIVE (N/A)  Patient Location: PACU  Anesthesia Type:General  Level of Consciousness: awake, alert , oriented and patient cooperative  Airway & Oxygen Therapy: Patient Spontanous Breathing and Patient connected to nasal cannula oxygen  Post-op Assessment: Report given to RN, Post -op Vital signs reviewed and stable and Patient moving all extremities  Post vital signs: Reviewed and stable  Last Vitals:  Vitals:   12/25/16 1022 12/25/16 1556  BP: (!) 163/65 139/69  Pulse: 90 86  Resp: 20 11  Temp: 36.4 C 36.3 C    Last Pain:  Vitals:   12/25/16 1556  TempSrc:   PainSc: 0-No pain         Complications: No apparent anesthesia complications

## 2016-12-25 NOTE — Discharge Instructions (Addendum)
Anterior Cervical Fusion Care After Pinching of the nerves is a common cause of long-term pain. When this happens, a procedure called an anterior cervical fusion is sometimes performed. It relieves the pressure on the pinched nerve roots or spinal cord in the neck. An anterior cervical fusion means that the operation is done through the front (anterior) of your neck to fuse bones in your neck together. This procedure is done to relieve the pressure on pinched nerve roots or spinal cord. This operation is done to control the movement of your spine, which Zeniyah be pressing on the nerves. This Keelia relieve the pain. The procedure that stops the movement of the spine is called a fusion. The cut by the surgeon (incision) is usually within a skin fold line under your chin. After moving the neck muscles gently apart, the neurosurgeon uses an operating microscope and removes the injured intervertebral disk (the cushion or pad of tissue between the bones of the spine). This takes the pressure off the nerves or spinal cord. This is called decompression. The area where the disc was removed is then filled with a bone graft. The graft will fuse the vertebrae together over time. This means it causes the vertebral bodies to grow together. The bone graft Andreyah be obtained from your own bone (your hip for example), or Estefana be obtained from a bone bank. Receiving bone from a bone bank is similar to a blood bank, only the bone comes from human donors who have recently died. This type of graft is referred to as allograft bone. The preformed bone plug is safe and will not be rejected by your body. It does not contain blood cells. In some cases, the surgeon Kamilla use hardware in your neck to help stabilize it. This means that metal plates or pins or screws Bentli be used to:  Provide extra support to the neck.   Help the bones to grow together more easily.  A cervical fusion procedure takes a couple hours to several hours, depending on  what needs to be done. Your caregiver will be able to answer your questions for you. HOME CARE INSTRUCTIONS   It will be normal to have a sore throat and have difficulty swallowing foods for a couple weeks following surgery. See your caregiver if this seems to be getting worse rather than better.   You Else resume normal diet and activities as directed or allowed. Generally, walking and stair climbing are fine. Avoid lifting more than ten pounds and do no lifting above your head.   If given a cervical collar, remove only for bathing and eating, or as directed.   Use only showers for cleaning up, with no bathing, until seen.   You Patches apply ice to the surgical or bone donor site for 15 to 20 minutes each hour while awake for the first couple days following surgery. Put the ice in a plastic bag and place a towel between the bag of ice and your skin.   Change dressings if necessary or as directed.   You Fannie drive in 10 days   Take prescribed medication as directed. Only take over-the-counter or prescription medicines for pain, discomfort, or fever as directed by your caregiver.   Make an appointment to see your caregiver for suture or staple removal when instructed.   If physical therapy was prescribed, follow your caregiver's directions.  SEEK IMMEDIATE MEDICAL CARE IF:  There is redness, swelling, or increasing pain in the wound.   There is  pus coming from the wound.   An unexplained oral temperature over 102 F (38.9 C) develops.   There is a bad smell coming from the wound or dressing.   You have swelling in your calf or leg.   You develop shortness of breath or chest pain.   The wound edges break open after sutures or staples have been removed.   Your pain is not controlled with medicine.   You seem to be getting worse rather than better.  Document Released: 06/23/2004 Document Revised: 07/22/2011 Document Reviewed: 08/29/2008    Wound Care Leave incision open to  air. You Mailee shower. Do not scrub directly on incision.  Do not put any creams, lotions, or ointments on incision. Activity Walk each and every day, increasing distance each day. No lifting greater than 5 lbs.  Avoid excessive neck motion. No driving for 2 weeks; Yani ride as a passenger locally. Wear neck brace at all times except when showering.  If provided soft collar, Dakiyah wear for comfort unless otherwise instructed. Diet Resume your normal diet.  Return to Work Will be discussed at you follow up appointment. Call Your Doctor If Any of These Occur Redness, drainage, or swelling at the wound.  Temperature greater than 101 degrees. Severe pain not relieved by pain medication. Increased difficulty swallowing. Incision starts to come apart. Follow Up Appt Call today for appointment in 4 weeks ([phone]) or for problems.  If you have any hardware placed in your spine, you will need an x-ray before your appointment. [employer] Patient Information 2012 [place].

## 2016-12-26 DIAGNOSIS — M4722 Other spondylosis with radiculopathy, cervical region: Secondary | ICD-10-CM | POA: Diagnosis not present

## 2016-12-26 LAB — GLUCOSE, CAPILLARY: GLUCOSE-CAPILLARY: 210 mg/dL — AB (ref 65–99)

## 2016-12-26 NOTE — Progress Notes (Signed)
Patient alert and oriented, mae's well, voiding adequate amount of urine, swallowing without difficulty, no c/o pain. Patient discharged home with family. Script and discharged instructions given to patient. Patient and family stated understanding of d/c instructions given and has an appointment with MD. 

## 2016-12-28 ENCOUNTER — Encounter (HOSPITAL_COMMUNITY): Payer: Self-pay | Admitting: Neurosurgery

## 2017-01-28 ENCOUNTER — Other Ambulatory Visit: Payer: Self-pay | Admitting: Gastroenterology

## 2017-01-28 ENCOUNTER — Ambulatory Visit (HOSPITAL_COMMUNITY)
Admission: RE | Admit: 2017-01-28 | Discharge: 2017-01-28 | Disposition: A | Discharge status: Home / Self Care | Payer: Medicare HMO | Source: Ambulatory Visit | Attending: Gastroenterology | Admitting: Gastroenterology

## 2017-01-28 DIAGNOSIS — R1084 Generalized abdominal pain: Secondary | ICD-10-CM

## 2017-01-28 DIAGNOSIS — I77811 Abdominal aortic ectasia: Secondary | ICD-10-CM | POA: Insufficient documentation

## 2017-01-28 MED ORDER — IOPAMIDOL (ISOVUE-300) INJECTION 61%
INTRAVENOUS | Status: AC
  Administered 2017-01-28: 100 mL
  Filled 2017-01-28: qty 100

## 2017-01-28 MED ORDER — IOPAMIDOL (ISOVUE-300) INJECTION 61%
INTRAVENOUS | Status: AC
  Filled 2017-01-28: qty 30

## 2017-02-10 ENCOUNTER — Encounter: Payer: Self-pay | Admitting: Obstetrics and Gynecology

## 2017-02-10 ENCOUNTER — Other Ambulatory Visit (HOSPITAL_COMMUNITY)
Admission: RE | Admit: 2017-02-10 | Discharge: 2017-02-10 | Disposition: A | Discharge status: Home / Self Care | Payer: Medicare HMO | Source: Ambulatory Visit | Attending: Obstetrics and Gynecology | Admitting: Obstetrics and Gynecology

## 2017-02-10 ENCOUNTER — Ambulatory Visit (INDEPENDENT_AMBULATORY_CARE_PROVIDER_SITE_OTHER): Payer: Medicare HMO | Admitting: Obstetrics and Gynecology

## 2017-02-10 DIAGNOSIS — N898 Other specified noninflammatory disorders of vagina: Secondary | ICD-10-CM

## 2017-02-10 DIAGNOSIS — R300 Dysuria: Secondary | ICD-10-CM | POA: Diagnosis not present

## 2017-02-10 NOTE — Progress Notes (Signed)
Pt presents with 6 month c/o vaginal odor and some dysuria. She reports having negative cultures from the ER several months ago.  She is occ sexual active. Last intercourse was in Feb, She does report some discomfort with intercourse along with vaginal dryness. She had a hysterectomy in 1997. She has Type 2 DM, controlled, last HgbA1C 6.0. HTN, controlled, COPD, no chronic steroid use. She denies any recent antibiotic use.  PE AF VSS Lungs clear Heart RRR GU Nl EGBUS, scant white discharge, slightly atrophic, cervix and uterus absent, slight bladder tenderness, no adnexal tenderness  A/P Vaginal odor, cultures obtained         Dysuria, urine culture  Pt will be contact with her test results and therapy as indicated by results

## 2017-02-10 NOTE — Patient Instructions (Signed)

## 2017-02-11 ENCOUNTER — Other Ambulatory Visit: Payer: Self-pay | Admitting: *Deleted

## 2017-02-11 DIAGNOSIS — N898 Other specified noninflammatory disorders of vagina: Secondary | ICD-10-CM

## 2017-02-11 LAB — CERVICOVAGINAL ANCILLARY ONLY
Bacterial vaginitis: NEGATIVE
CANDIDA VAGINITIS: NEGATIVE

## 2017-02-11 MED ORDER — ESTROGENS, CONJUGATED 0.625 MG/GM VA CREA: TOPICAL_CREAM | VAGINAL | 12 refills | Status: DC

## 2017-02-12 LAB — URINE CULTURE

## 2017-02-26 ENCOUNTER — Emergency Department (HOSPITAL_COMMUNITY)
Admission: EM | Admit: 2017-02-26 | Discharge: 2017-02-26 | Disposition: A | Discharge status: Home / Self Care | Payer: Medicare HMO | Attending: Emergency Medicine | Admitting: Emergency Medicine

## 2017-02-26 ENCOUNTER — Encounter (HOSPITAL_COMMUNITY): Payer: Self-pay

## 2017-02-26 DIAGNOSIS — R51 Headache: Secondary | ICD-10-CM | POA: Diagnosis not present

## 2017-02-26 DIAGNOSIS — I10 Essential (primary) hypertension: Secondary | ICD-10-CM | POA: Diagnosis not present

## 2017-02-26 DIAGNOSIS — Z79899 Other long term (current) drug therapy: Secondary | ICD-10-CM | POA: Diagnosis not present

## 2017-02-26 DIAGNOSIS — J449 Chronic obstructive pulmonary disease, unspecified: Secondary | ICD-10-CM | POA: Diagnosis not present

## 2017-02-26 DIAGNOSIS — Z7984 Long term (current) use of oral hypoglycemic drugs: Secondary | ICD-10-CM | POA: Insufficient documentation

## 2017-02-26 DIAGNOSIS — E119 Type 2 diabetes mellitus without complications: Secondary | ICD-10-CM | POA: Insufficient documentation

## 2017-02-26 DIAGNOSIS — F17210 Nicotine dependence, cigarettes, uncomplicated: Secondary | ICD-10-CM | POA: Insufficient documentation

## 2017-02-26 DIAGNOSIS — R519 Headache, unspecified: Secondary | ICD-10-CM

## 2017-02-26 LAB — CBC WITH DIFFERENTIAL/PLATELET
Basophils Absolute: 0.1 10*3/uL (ref 0.0–0.1)
Basophils Relative: 1 %
Eosinophils Absolute: 0.4 10*3/uL (ref 0.0–0.7)
Eosinophils Relative: 4 %
HEMATOCRIT: 39.7 % (ref 36.0–46.0)
HEMOGLOBIN: 13.2 g/dL (ref 12.0–15.0)
LYMPHS ABS: 3.9 10*3/uL (ref 0.7–4.0)
LYMPHS PCT: 38 %
MCH: 29.5 pg (ref 26.0–34.0)
MCHC: 33.2 g/dL (ref 30.0–36.0)
MCV: 88.6 fL (ref 78.0–100.0)
MONO ABS: 0.7 10*3/uL (ref 0.1–1.0)
Monocytes Relative: 7 %
NEUTROS ABS: 5.3 10*3/uL (ref 1.7–7.7)
Neutrophils Relative %: 50 %
Platelets: 309 10*3/uL (ref 150–400)
RBC: 4.48 MIL/uL (ref 3.87–5.11)
RDW: 13.1 % (ref 11.5–15.5)
WBC: 10.4 10*3/uL (ref 4.0–10.5)

## 2017-02-26 LAB — BASIC METABOLIC PANEL
ANION GAP: 11 (ref 5–15)
BUN: 12 mg/dL (ref 6–20)
CHLORIDE: 103 mmol/L (ref 101–111)
CO2: 24 mmol/L (ref 22–32)
Calcium: 9.4 mg/dL (ref 8.9–10.3)
Creatinine, Ser: 0.98 mg/dL (ref 0.44–1.00)
GFR calc Af Amer: >60 mL/min (ref >60)
GFR calc non Af Amer: 58 mL/min — ABNORMAL LOW (ref >60)
GLUCOSE: 129 mg/dL — AB (ref 65–99)
Potassium: 3.3 mmol/L — ABNORMAL LOW (ref 3.5–5.1)
Sodium: 138 mmol/L (ref 135–145)

## 2017-02-26 MED ORDER — ACETAMINOPHEN 325 MG PO TABS
650.0000 mg | ORAL_TABLET | Freq: Once | ORAL | Status: AC
  Administered 2017-02-26: 650 mg via ORAL
  Filled 2017-02-26: qty 2

## 2017-02-26 MED ORDER — DIPHENHYDRAMINE HCL 50 MG/ML IJ SOLN
25.0000 mg | Freq: Once | INTRAMUSCULAR | Status: AC
  Administered 2017-02-26: 25 mg via INTRAVENOUS
  Filled 2017-02-26: qty 1

## 2017-02-26 MED ORDER — METOCLOPRAMIDE HCL 5 MG/ML IJ SOLN
10.0000 mg | Freq: Once | INTRAMUSCULAR | Status: AC
  Administered 2017-02-26: 10 mg via INTRAVENOUS
  Filled 2017-02-26: qty 2

## 2017-02-26 MED ORDER — SODIUM CHLORIDE 0.9 % IV BOLUS (SEPSIS)
1000.0000 mL | Freq: Once | INTRAVENOUS | Status: AC
  Administered 2017-02-26: 1000 mL via INTRAVENOUS

## 2017-02-26 NOTE — ED Triage Notes (Signed)
Pt complaining of hypertension and headache. Pt states takes bp meds as rx'd. Pt denies any chest pain, blurry vision or dizziness. Pt ambulatory at triage.

## 2017-02-26 NOTE — ED Notes (Signed)
Nurse currently starting IV and getting labs

## 2017-02-26 NOTE — ED Provider Notes (Signed)
[facility] DEPT Provider Note   CSN: [id number] Arrival date & time: 02/26/17  2026     History   Chief Complaint Chief Complaint  Patient presents with  . Headache  . Hypertension    HPI [name] is a 71 y.o. female.  [name] is a 71 y.o. Female with a history of hypertension, DM, COPD, and anxiety who presents to the ED complaining of hypertension and a headache. Patient reports around 2 PM today she noticed that she was feeling lightheaded with position change. She checked her blood sugar and blood pressure and noted that her blood pressure was elevated to 469G systolic. She was then developing a gradual onset of a left-sided headache. She currently reports a 5 out of 10 headache. He reports taking her nighttime blood pressure medications already today. She denies any changes to her blood pressure medicines recently. She denies fevers, recent illness, head injury, chest pain, shortness of breath, double vision, neck pain, abdominal pain, nausea, vomiting, numbness, tingling, weakness, or rashes.   The history is provided by the patient and medical records. No language interpreter was used.  Headache   Pertinent negatives include no fever, no palpitations, no shortness of breath, no nausea and no vomiting.  Hypertension  Associated symptoms include headaches. Pertinent negatives include no chest pain, no abdominal pain and no shortness of breath.    Past Medical History:  Diagnosis Date  . Anxiety   . Arthritis   . Chronic back pain greater than 3 months duration   . Chronic neck pain   . COPD (chronic obstructive pulmonary disease) ([place])   . Diabetes mellitus   . GERD (gastroesophageal reflux disease)   . H/O hiatal hernia   . Headache(784.0)   . Hemorrhoids   . Herniated disc   . History of kidney stones   . Hx of echocardiogram 2010   normal EF  . Hypertension   . Palpitations   . Shortness of breath on exertion 11/10/11   "sometimes"  . Vertigo      Patient Active Problem List   Diagnosis Date Noted  . Vaginal odor 02/10/2017  . Dysuria 02/10/2017  . Osteoarthritis of spine with radiculopathy, cervical region 12/25/2016  . Acute exacerbation of chronic obstructive pulmonary disease (COPD) ([place]) 10/11/2015  . Orthostatic hypotension 06/18/2013  . Numbness and tingling in right hand 11/10/2011    Class: Acute  . Hypertension 11/10/2011  . Cervical disc disease 11/10/2011    Class: Chronic  . DM II (diabetes mellitus, type II), controlled ([place]) 11/10/2011    Class: Chronic    Past Surgical History:  Procedure Laterality Date  . ABDOMINAL HYSTERECTOMY  1997?   "partial"  . ANTERIOR CERVICAL DECOMP/DISCECTOMY FUSION N/A 12/25/2016   Procedure: ANTERIOR CERVICAL DECOMPRESSION/DISCECTOMY FUSION CERVICAL FOUR CERVICAL FIVE;  Surgeon: Ashok Pall, MD;  Location: [facility];  Service: Neurosurgery;  Laterality: N/A;  . CHOLECYSTECTOMY  ~ 2008  . COLONOSCOPY    . FRACTURE SURGERY  2004?   right shoulder  . KNEE ARTHROSCOPY  2004   right  . KNEE SURGERY     right  . neck fusion    . SHOULDER OPEN ROTATOR CUFF REPAIR  07/20/2011   left  . TONSILLECTOMY      OB History    No data available       Home Medications    Prior to Admission medications   Medication Sig Start Date End Date Taking? Authorizing Provider  albuterol (PROVENTIL) (2.5 MG/3ML)  0.083% nebulizer solution USE 1 VIAL EVERY 6 HOURS AS NEEDED FOR SHORTNESS OF BREATH 10/15/15   Historical Provider, MD  ALPRAZolam Duanne Moron) 0.25 MG tablet Take 0.25 mg by mouth daily.  10/16/14   Historical Provider, MD  amLODipine (NORVASC) 5 MG tablet Take 5 mg by mouth 2 (two) times daily.     Historical Provider, MD  cloNIDine (CATAPRES) 0.1 MG tablet Take 0.1 mg by mouth 2 (two) times daily as needed (high blood pressure).     Historical Provider, MD  conjugated estrogens (PREMARIN) vaginal cream Use 1/2 applicator 3 times weekly. 02/11/17   Chancy Milroy, MD  cyclobenzaprine  (FLEXERIL) 10 MG tablet Take 10 mg by mouth at bedtime as needed for muscle spasms.    Historical Provider, MD  hydrALAZINE (APRESOLINE) 50 MG tablet Take 50 mg by mouth 3 (three) times daily.     Historical Provider, MD  HYDROcodone-acetaminophen (NORCO/VICODIN) 5-325 MG tablet Take 1 tablet by mouth every 6 (six) hours as needed for moderate pain. 12/25/16   Ashok Pall, MD  metFORMIN (GLUCOPHAGE) 500 MG tablet Take 500 mg by mouth 2 (two) times daily. 01/06/14   Historical Provider, MD  metoprolol (LOPRESSOR) 50 MG tablet Take 25 mg by mouth 2 (two) times daily as needed (for palpitation).     Historical Provider, MD  PROAIR HFA 108 (90 BASE) MCG/ACT inhaler INHALE 2 PUFFS EVERY 4 TO 6 HOURS AS NEEDED FOR SHORTNESS OF BREATH 09/23/15   Historical Provider, MD  rosuvastatin (CRESTOR) 10 MG tablet Take 10 mg by mouth daily.    Historical Provider, MD  sodium chloride (OCEAN) 0.65 % SOLN nasal spray Place 1 spray into both nostrils as needed for congestion. Patient not taking: Reported on 09/30/2016 10/06/15   Ashley Murrain, NP    Family History History reviewed. No pertinent family history.  Social History Social History  Substance Use Topics  . Smoking status: Current Some Day Smoker    Packs/day: 0.25    Types: Cigarettes  . Smokeless tobacco: Never Used     Comment: 4-5 cigs / day  . Alcohol use No     Allergies   Oxycodone   Review of Systems Review of Systems  Constitutional: Negative for chills and fever.  HENT: Negative for congestion and sore throat.   Eyes: Negative for pain and visual disturbance.  Respiratory: Negative for cough and shortness of breath.   Cardiovascular: Negative for chest pain and palpitations.  Gastrointestinal: Negative for abdominal pain, nausea and vomiting.  Genitourinary: Negative for dysuria.  Musculoskeletal: Negative for back pain, neck pain and neck stiffness.  Skin: Negative for rash.  Neurological: Positive for light-headedness and  headaches. Negative for dizziness, syncope, weakness and numbness.     Physical Exam Updated Vital Signs BP 127/68   Pulse 65   Temp 97.8 F (36.6 C)   Resp 16   SpO2 96%   Physical Exam  Constitutional: She is oriented to person, place, and time. She appears well-developed and well-nourished. No distress.  Nontoxic appearing.  HENT:  Head: Normocephalic and atraumatic.  Right Ear: External ear normal.  Left Ear: External ear normal.  Mouth/Throat: Oropharynx is clear and moist.  Eyes: Conjunctivae and EOM are normal. Pupils are equal, round, and reactive to light. Right eye exhibits no discharge. Left eye exhibits no discharge.  Neck: Normal range of motion. Neck supple. No JVD present. No tracheal deviation present.  Cardiovascular: Normal rate, regular rhythm, normal heart sounds and intact distal pulses.  Exam reveals no gallop and no friction rub.   No murmur heard. Bilateral radial pulses are intact and equal.  Pulmonary/Chest: Effort normal and breath sounds normal. No stridor. No respiratory distress. She has no wheezes. She has no rales.  Abdominal: Soft. There is no tenderness. There is no guarding.  Musculoskeletal: Normal range of motion. She exhibits no edema or tenderness.  No lower extremity edema or tenderness.  Lymphadenopathy:    She has no cervical adenopathy.  Neurological: She is alert and oriented to person, place, and time. No cranial nerve deficit or sensory deficit. Coordination normal.  Alert and oriented 3. Cranial nerves are intact. Speech is clear and coherent. EOMs are intact. Finger to nose intact bilaterally. No pronator drift. Sensation is intact to her bilateral upper and lower extremities.  Skin: Skin is warm and dry. Capillary refill takes less than 2 seconds. No rash noted. She is not diaphoretic. No erythema. No pallor.  Psychiatric: She has a normal mood and affect. Her behavior is normal.  Nursing note and vitals reviewed.    ED  Treatments / Results  Labs (all labs ordered are listed, but only abnormal results are displayed) Labs Reviewed  BASIC METABOLIC PANEL - Abnormal; Notable for the following:       Result Value   Potassium 3.3 (*)    Glucose, Bld 129 (*)    GFR calc non Af Amer 58 (*)    All other components within normal limits  CBC WITH DIFFERENTIAL/PLATELET    EKG  EKG Interpretation None       Radiology No results found.  Procedures Procedures (including critical care time)  Medications Ordered in ED Medications  sodium chloride 0.9 % bolus 1,000 mL (1,000 mLs Intravenous New Bag/Given 02/26/17 2217)  metoCLOPramide (REGLAN) injection 10 mg (10 mg Intravenous Given 02/26/17 2218)  diphenhydrAMINE (BENADRYL) injection 25 mg (25 mg Intravenous Given 02/26/17 2218)  acetaminophen (TYLENOL) tablet 650 mg (650 mg Oral Given 02/26/17 2219)     Initial Impression / Assessment and Plan / ED Course  I have reviewed the triage vital signs and the nursing notes.  Pertinent labs & imaging results that were available during my care of the patient were reviewed by me and considered in my medical decision making (see chart for details).    This is a 71 y.o. Female with a history of hypertension, DM, COPD, and anxiety who presents to the ED complaining of hypertension and a headache. Patient reports around 2 PM today she noticed that she was feeling lightheaded with position change. She checked her blood sugar and blood pressure and noted that her blood pressure was elevated to 341D systolic. She was then developing a gradual onset of a left-sided headache. She currently reports a 5 out of 10 headache. He reports taking her nighttime blood pressure medications already today. She denies any changes to her blood pressure medicines recently.  On examination patient is afebrile nontoxic appearing. She has no focal neurological deficits on exam. No temporal edema or tenderness. She denies sudden onset headache. She  denies any chest pain or shortness of breath. We'll provide with migraine cocktail and check blood work. BMP shows a glucose of 129. Creatinine is 0.98. This is an improvement from her baseline. No evidence of end organ damage. CBC is unremarkable. At reevaluation patient reports headache has resolved. Blood pressure has normalized at 127/68. We'll discharge at this time and have her follow-up with primary care. No changes to her medications at this  time. I discussed return precautions with the patient. I advised the patient to follow-up with their primary care provider this week. I advised the patient to return to the emergency department with new or worsening symptoms or new concerns. The patient verbalized understanding and agreement with plan.    This patient was discussed with Dr. Roderic Palau who agrees with assessment and plan.   Final Clinical Impressions(s) / ED Diagnoses   Final diagnoses:  Bad headache  Essential hypertension    New Prescriptions New Prescriptions   No medications on file     Waynetta Pean, PA-C 02/26/17 2336    Milton Ferguson, MD 02/27/17 2320

## 2017-02-27 ENCOUNTER — Emergency Department (HOSPITAL_COMMUNITY): Payer: Medicare HMO

## 2017-02-27 ENCOUNTER — Emergency Department (HOSPITAL_COMMUNITY)
Admission: EM | Admit: 2017-02-27 | Discharge: 2017-02-27 | Disposition: A | Discharge status: Home / Self Care | Payer: Medicare HMO | Attending: Emergency Medicine | Admitting: Emergency Medicine

## 2017-02-27 ENCOUNTER — Encounter (HOSPITAL_COMMUNITY): Payer: Self-pay | Admitting: Emergency Medicine

## 2017-02-27 DIAGNOSIS — Z7984 Long term (current) use of oral hypoglycemic drugs: Secondary | ICD-10-CM | POA: Insufficient documentation

## 2017-02-27 DIAGNOSIS — E119 Type 2 diabetes mellitus without complications: Secondary | ICD-10-CM | POA: Insufficient documentation

## 2017-02-27 DIAGNOSIS — R0602 Shortness of breath: Secondary | ICD-10-CM | POA: Diagnosis not present

## 2017-02-27 DIAGNOSIS — R197 Diarrhea, unspecified: Secondary | ICD-10-CM

## 2017-02-27 DIAGNOSIS — J449 Chronic obstructive pulmonary disease, unspecified: Secondary | ICD-10-CM | POA: Diagnosis not present

## 2017-02-27 DIAGNOSIS — Z79899 Other long term (current) drug therapy: Secondary | ICD-10-CM | POA: Insufficient documentation

## 2017-02-27 DIAGNOSIS — F17210 Nicotine dependence, cigarettes, uncomplicated: Secondary | ICD-10-CM | POA: Diagnosis not present

## 2017-02-27 DIAGNOSIS — E860 Dehydration: Secondary | ICD-10-CM | POA: Diagnosis not present

## 2017-02-27 DIAGNOSIS — I10 Essential (primary) hypertension: Secondary | ICD-10-CM | POA: Insufficient documentation

## 2017-02-27 DIAGNOSIS — R55 Syncope and collapse: Secondary | ICD-10-CM | POA: Diagnosis present

## 2017-02-27 LAB — BASIC METABOLIC PANEL
Anion gap: 11 (ref 5–15)
BUN: 12 mg/dL (ref 6–20)
CO2: 24 mmol/L (ref 22–32)
CREATININE: 0.97 mg/dL (ref 0.44–1.00)
Calcium: 9.1 mg/dL (ref 8.9–10.3)
Chloride: 104 mmol/L (ref 101–111)
GFR calc Af Amer: >60 mL/min (ref >60)
GFR, EST NON AFRICAN AMERICAN: 58 mL/min — AB (ref >60)
Glucose, Bld: 127 mg/dL — ABNORMAL HIGH (ref 65–99)
Potassium: 3.2 mmol/L — ABNORMAL LOW (ref 3.5–5.1)
SODIUM: 139 mmol/L (ref 135–145)

## 2017-02-27 LAB — I-STAT CG4 LACTIC ACID, ED: LACTIC ACID, VENOUS: 1.62 mmol/L (ref 0.5–1.9)

## 2017-02-27 LAB — URINALYSIS, ROUTINE W REFLEX MICROSCOPIC
BACTERIA UA: NONE SEEN
BILIRUBIN URINE: NEGATIVE
Glucose, UA: NEGATIVE mg/dL
Ketones, ur: NEGATIVE mg/dL
LEUKOCYTES UA: NEGATIVE
NITRITE: NEGATIVE
PH: 6 (ref 5.0–8.0)
Protein, ur: 30 mg/dL — AB
SPECIFIC GRAVITY, URINE: 1.002 — AB (ref 1.005–1.030)

## 2017-02-27 LAB — CBG MONITORING, ED: GLUCOSE-CAPILLARY: 133 mg/dL — AB (ref 65–99)

## 2017-02-27 LAB — CBC
HEMATOCRIT: 39.3 % (ref 36.0–46.0)
Hemoglobin: 12.9 g/dL (ref 12.0–15.0)
MCH: 29.1 pg (ref 26.0–34.0)
MCHC: 32.8 g/dL (ref 30.0–36.0)
MCV: 88.7 fL (ref 78.0–100.0)
PLATELETS: 362 10*3/uL (ref 150–400)
RBC: 4.43 MIL/uL (ref 3.87–5.11)
RDW: 13.3 % (ref 11.5–15.5)
WBC: 8.5 10*3/uL (ref 4.0–10.5)

## 2017-02-27 LAB — I-STAT TROPONIN, ED: TROPONIN I, POC: 0.01 ng/mL (ref 0.00–0.08)

## 2017-02-27 LAB — BRAIN NATRIURETIC PEPTIDE: B NATRIURETIC PEPTIDE 5: 28 pg/mL (ref 0.0–100.0)

## 2017-02-27 LAB — MAGNESIUM: MAGNESIUM: 1.7 mg/dL (ref 1.7–2.4)

## 2017-02-27 MED ORDER — DICYCLOMINE HCL 20 MG PO TABS: 20.0000 mg | ORAL_TABLET | Freq: Three times a day (TID) | ORAL | 0 refills | Status: DC

## 2017-02-27 MED ORDER — POTASSIUM CHLORIDE CRYS ER 20 MEQ PO TBCR
40.0000 meq | EXTENDED_RELEASE_TABLET | Freq: Once | ORAL | Status: AC
  Administered 2017-02-27: 40 meq via ORAL
  Filled 2017-02-27: qty 2

## 2017-02-27 MED ORDER — ONDANSETRON 4 MG PO TBDP: 4.0000 mg | ORAL_TABLET | Freq: Three times a day (TID) | ORAL | 0 refills | Status: DC | PRN

## 2017-02-27 MED ORDER — SODIUM CHLORIDE 0.9 % IV BOLUS (SEPSIS)
500.0000 mL | Freq: Once | INTRAVENOUS | Status: AC
  Administered 2017-02-27: 500 mL via INTRAVENOUS

## 2017-02-27 MED ORDER — DIPHENHYDRAMINE HCL 50 MG/ML IJ SOLN
25.0000 mg | Freq: Once | INTRAMUSCULAR | Status: AC
  Administered 2017-02-27: 25 mg via INTRAVENOUS
  Filled 2017-02-27: qty 1

## 2017-02-27 MED ORDER — POTASSIUM CHLORIDE CRYS ER 20 MEQ PO TBCR: 40.0000 meq | EXTENDED_RELEASE_TABLET | Freq: Once | ORAL | 0 refills | Status: DC

## 2017-02-27 MED ORDER — METOCLOPRAMIDE HCL 5 MG/ML IJ SOLN
10.0000 mg | Freq: Once | INTRAMUSCULAR | Status: AC
  Administered 2017-02-27: 10 mg via INTRAVENOUS
  Filled 2017-02-27: qty 2

## 2017-02-27 MED ORDER — DICYCLOMINE HCL 20 MG PO TABS: 20.0000 mg | ORAL_TABLET | Freq: Two times a day (BID) | ORAL | 0 refills | Status: DC

## 2017-02-27 NOTE — ED Provider Notes (Signed)
[facility] DEPT Provider Note   CSN: [id number] Arrival date & time: 02/27/17  1451     History   Chief Complaint Chief Complaint  Patient presents with  . Near Syncope  . Palpitations    HPI [name] is a 71 y.o. female.  HPI   71 yo F with PMHx below including HTN, HLD DM2, chronic pain here with multiple complaints. Pt states that for the past 3 days her blood pressure has been running 150-160s, which is high for her. She has had intermittent associated headache, blurred vision, and fatigue. Over past 24 hours, she developed worsening HA and was seen in ED, d/c'ed with improvement after reassuring exam. She states she felt better but has since developed loose, watery diarrhea. No abdominal pain but has had some nausea, no vomiting. She has not ate/drank much today nad endorses mild palpitations and lightheadedness with standing that started today. She felt like she was going to pass out after standing earlier so presents for re-eval. She endorses diffuse body cramping as well. No known fevers.  Past Medical History:  Diagnosis Date  . Anxiety   . Arthritis   . Chronic back pain greater than 3 months duration   . Chronic neck pain   . COPD (chronic obstructive pulmonary disease) ([place])   . Diabetes mellitus   . GERD (gastroesophageal reflux disease)   . H/O hiatal hernia   . Headache(784.0)   . Hemorrhoids   . Herniated disc   . History of kidney stones   . Hx of echocardiogram 2010   normal EF  . Hypertension   . Palpitations   . Shortness of breath on exertion 11/10/11   "sometimes"  . Vertigo     Patient Active Problem List   Diagnosis Date Noted  . Vaginal odor 02/10/2017  . Dysuria 02/10/2017  . Osteoarthritis of spine with radiculopathy, cervical region 12/25/2016  . Acute exacerbation of chronic obstructive pulmonary disease (COPD) ([place]) 10/11/2015  . Orthostatic hypotension 06/18/2013  . Numbness and tingling in right hand 11/10/2011    Class:  Acute  . Hypertension 11/10/2011  . Cervical disc disease 11/10/2011    Class: Chronic  . DM II (diabetes mellitus, type II), controlled ([place]) 11/10/2011    Class: Chronic    Past Surgical History:  Procedure Laterality Date  . ABDOMINAL HYSTERECTOMY  1997?   "partial"  . ANTERIOR CERVICAL DECOMP/DISCECTOMY FUSION N/A 12/25/2016   Procedure: ANTERIOR CERVICAL DECOMPRESSION/DISCECTOMY FUSION CERVICAL FOUR CERVICAL FIVE;  Surgeon: Ashok Pall, MD;  Location: [facility];  Service: Neurosurgery;  Laterality: N/A;  . CHOLECYSTECTOMY  ~ 2008  . COLONOSCOPY    . FRACTURE SURGERY  2004?   right shoulder  . KNEE ARTHROSCOPY  2004   right  . KNEE SURGERY     right  . neck fusion    . SHOULDER OPEN ROTATOR CUFF REPAIR  07/20/2011   left  . TONSILLECTOMY      OB History    No data available       Home Medications    Prior to Admission medications   Medication Sig Start Date End Date Taking? Authorizing Provider  albuterol (PROVENTIL) (2.5 MG/3ML) 0.083% nebulizer solution USE 1 VIAL EVERY 6 HOURS AS NEEDED FOR SHORTNESS OF BREATH 10/15/15   Historical Provider, MD  ALPRAZolam Duanne Moron) 0.25 MG tablet Take 0.25 mg by mouth daily.  10/16/14   Historical Provider, MD  amLODipine (NORVASC) 5 MG tablet Take 5 mg by mouth 2 (two) times  daily.     Historical Provider, MD  cloNIDine (CATAPRES) 0.1 MG tablet Take 0.1 mg by mouth 2 (two) times daily as needed (high blood pressure).     Historical Provider, MD  conjugated estrogens (PREMARIN) vaginal cream Use 1/2 applicator 3 times weekly. 02/11/17   Chancy Milroy, MD  cyclobenzaprine (FLEXERIL) 10 MG tablet Take 10 mg by mouth at bedtime as needed for muscle spasms.    Historical Provider, MD  dicyclomine (BENTYL) 20 MG tablet Take 1 tablet (20 mg total) by mouth 2 (two) times daily with a meal. 02/27/17 03/09/17  Duffy Bruce, MD  hydrALAZINE (APRESOLINE) 50 MG tablet Take 50 mg by mouth 3 (three) times daily.     Historical Provider, MD    HYDROcodone-acetaminophen (NORCO/VICODIN) 5-325 MG tablet Take 1 tablet by mouth every 6 (six) hours as needed for moderate pain. 12/25/16   Ashok Pall, MD  metFORMIN (GLUCOPHAGE) 500 MG tablet Take 500 mg by mouth 2 (two) times daily. 01/06/14   Historical Provider, MD  metoprolol (LOPRESSOR) 50 MG tablet Take 25 mg by mouth 2 (two) times daily as needed (for palpitation).     Historical Provider, MD  ondansetron (ZOFRAN ODT) 4 MG disintegrating tablet Take 1 tablet (4 mg total) by mouth every 8 (eight) hours as needed for nausea or vomiting. 02/27/17   Duffy Bruce, MD  potassium chloride SA (K-DUR,KLOR-CON) 20 MEQ tablet Take 2 tablets (40 mEq total) by mouth once. 02/28/17 02/28/17  Duffy Bruce, MD  PROAIR HFA 108 (90 BASE) MCG/ACT inhaler INHALE 2 PUFFS EVERY 4 TO 6 HOURS AS NEEDED FOR SHORTNESS OF BREATH 09/23/15   Historical Provider, MD  rosuvastatin (CRESTOR) 10 MG tablet Take 10 mg by mouth daily.    Historical Provider, MD  sodium chloride (OCEAN) 0.65 % SOLN nasal spray Place 1 spray into both nostrils as needed for congestion. Patient not taking: Reported on 09/30/2016 10/06/15   Ashley Murrain, NP    Family History History reviewed. No pertinent family history.  Social History Social History  Substance Use Topics  . Smoking status: Current Some Day Smoker    Packs/day: 0.25    Types: Cigarettes  . Smokeless tobacco: Never Used     Comment: 4-5 cigs / day  . Alcohol use No     Allergies   Oxycodone   Review of Systems Review of Systems  Constitutional: Positive for fatigue. Negative for fever.  Respiratory: Positive for shortness of breath.   Gastrointestinal: Positive for diarrhea and nausea.  Neurological: Positive for light-headedness and headaches.  All other systems reviewed and are negative.    Physical Exam Updated Vital Signs BP (!) 144/86   Pulse 74   Temp 98.7 F (37.1 C) (Oral)   Resp 18   Ht '5\' 7"'$  (1.702 m)   Wt 225 lb (102.1 kg)   SpO2 98%    BMI 35.24 kg/m   Physical Exam  Constitutional: She is oriented to person, place, and time. She appears well-developed and well-nourished. No distress.  HENT:  Head: Normocephalic and atraumatic.  Eyes: Conjunctivae are normal.  Neck: Neck supple.  Cardiovascular: Normal rate, regular rhythm and normal heart sounds.  Exam reveals no friction rub.   No murmur heard. Pulmonary/Chest: Effort normal and breath sounds normal. No respiratory distress. She has no wheezes. She has no rales.  Abdominal: She exhibits no distension.  Musculoskeletal: She exhibits no edema.  Neurological: She is alert and oriented to person, place, and time. No cranial  nerve deficit or sensory deficit. She exhibits normal muscle tone. Gait normal. GCS eye subscore is 4. GCS verbal subscore is 5. GCS motor subscore is 6.  Skin: Skin is warm. Capillary refill takes less than 2 seconds.  Psychiatric: She has a normal mood and affect.  Nursing note and vitals reviewed.    ED Treatments / Results  Labs (all labs ordered are listed, but only abnormal results are displayed) Labs Reviewed  BASIC METABOLIC PANEL - Abnormal; Notable for the following:       Result Value   Potassium 3.2 (*)    Glucose, Bld 127 (*)    GFR calc non Af Amer 58 (*)    All other components within normal limits  URINALYSIS, ROUTINE W REFLEX MICROSCOPIC - Abnormal; Notable for the following:    Color, Urine COLORLESS (*)    Specific Gravity, Urine 1.002 (*)    Hgb urine dipstick SMALL (*)    Protein, ur 30 (*)    Squamous Epithelial / LPF 0-5 (*)    All other components within normal limits  CBG MONITORING, ED - Abnormal; Notable for the following:    Glucose-Capillary 133 (*)    All other components within normal limits  CBC  MAGNESIUM  BRAIN NATRIURETIC PEPTIDE  I-STAT TROPOININ, ED  I-STAT CG4 LACTIC ACID, ED    EKG  EKG Interpretation  Date/Time:  Saturday February 27 2017 15:01:11 EDT Ventricular Rate:  95 PR  Interval:  184 QRS Duration: 86 QT Interval:  368 QTC Calculation: 462 R Axis:   -20 Text Interpretation:  Normal sinus rhythm Septal infarct , age undetermined Abnormal ECG No significant change since last tracing Confirmed by Terianne Thaker MD, Pria Klosinski [phone]) on 02/27/2017 3:38:39 PM       Radiology Dg Chest 2 View  Result Date: 02/27/2017 CLINICAL DATA:  Near syncopal episode. EXAM: CHEST  2 VIEW COMPARISON:  Chest x-ray 09/30/2016 FINDINGS: The cardiac silhouette, mediastinal and hilar contours are within normal limits and stable. There is mild tortuosity of the thoracic aorta. There is a density in the right upper lobe superimposed on the clavicle. This Cailie have been present on the prior study but was definitely smaller. Findings suspicious for a enlarging pulmonary nodule. Recommend chest CT with contrast for further evaluation. No infiltrates, edema or effusions. The bony thorax is intact. IMPRESSION: **An incidental finding of potential clinical significance has been found. Suspect enlarging right upper lobe pulmonary nodule.** Recommend chest CT with contrast for further evaluation. No acute cardiopulmonary findings. Electronically Signed   By: Marijo Sanes M.D.   On: 02/27/2017 16:53    Procedures Procedures (including critical care time)  Medications Ordered in ED Medications  metoCLOPramide (REGLAN) injection 10 mg (10 mg Intravenous Given 02/27/17 1610)  diphenhydrAMINE (BENADRYL) injection 25 mg (25 mg Intravenous Given 02/27/17 1611)  sodium chloride 0.9 % bolus 500 mL (0 mLs Intravenous Stopped 02/27/17 1710)  potassium chloride SA (K-DUR,KLOR-CON) CR tablet 40 mEq (40 mEq Oral Given 02/27/17 1807)     Initial Impression / Assessment and Plan / ED Course  I have reviewed the triage vital signs and the nursing notes.  Pertinent labs & imaging results that were available during my care of the patient were reviewed by me and considered in my medical decision making (see chart for  details).     71 yo F with PMHx as above here with mild positional dizziness in setting of nausea, diarrhea. Seen yesterday for mild symptomatic HTN. Suspect she has a  mild viral GI illness with dehydration, also with some component of orthostasis in setting of significant BP changes. Pt is o/w very well appearing. Neuro exam is non-focal. EKG non-ischemic and trop neg - do not suspect ACS, PE, or dissection. LA normal, WBC normal - doubt sepsis. BMP unremarkable, and abdomen soft, nt/nd and I do not suspect intra-abdominal pathology such as cholecystitis, diverticulitis. She does appear mildly dehydrated and was given IVF.  Following IVF, repletion of K, sx resolved and pt asymptomatic. She appears very well. She has had no arrhythmia here. I suspect much of her sx are 2/2 viral GI illness, also anxiety related to Bp changes. Reassured pt and will d/c with PCP f/u.  Final Clinical Impressions(s) / ED Diagnoses   Final diagnoses:  Dehydration  Diarrhea, unspecified type    New Prescriptions Discharge Medication List as of 02/27/2017  7:11 PM       Duffy Bruce, MD 02/28/17 0025

## 2017-02-27 NOTE — ED Notes (Signed)
ED Provider at bedside. 

## 2017-02-27 NOTE — ED Triage Notes (Signed)
Pt returning to ER for further evaluation of hypertension and feeling near syncopal onset this morning. Pt reports taking medications as she is supposed to. BP 202/78 at triage. Denies pain. Is a/o x4.

## 2017-04-26 NOTE — Addendum Note (Signed)
Addendum  created 04/26/17 1053 by Oleta Mouse, MD   Sign clinical note

## 2017-05-27 ENCOUNTER — Emergency Department (HOSPITAL_COMMUNITY)
Admission: EM | Admit: 2017-05-27 | Discharge: 2017-05-27 | Disposition: A | Discharge status: Home / Self Care | Payer: Medicare HMO | Attending: Emergency Medicine | Admitting: Emergency Medicine

## 2017-05-27 ENCOUNTER — Emergency Department (HOSPITAL_COMMUNITY): Payer: Medicare HMO

## 2017-05-27 ENCOUNTER — Encounter (HOSPITAL_COMMUNITY): Payer: Self-pay | Admitting: Emergency Medicine

## 2017-05-27 DIAGNOSIS — J449 Chronic obstructive pulmonary disease, unspecified: Secondary | ICD-10-CM | POA: Insufficient documentation

## 2017-05-27 DIAGNOSIS — Z9049 Acquired absence of other specified parts of digestive tract: Secondary | ICD-10-CM | POA: Insufficient documentation

## 2017-05-27 DIAGNOSIS — F419 Anxiety disorder, unspecified: Secondary | ICD-10-CM | POA: Insufficient documentation

## 2017-05-27 DIAGNOSIS — Z79899 Other long term (current) drug therapy: Secondary | ICD-10-CM | POA: Insufficient documentation

## 2017-05-27 DIAGNOSIS — R51 Headache: Secondary | ICD-10-CM | POA: Diagnosis not present

## 2017-05-27 DIAGNOSIS — R42 Dizziness and giddiness: Secondary | ICD-10-CM | POA: Diagnosis not present

## 2017-05-27 DIAGNOSIS — Z794 Long term (current) use of insulin: Secondary | ICD-10-CM | POA: Insufficient documentation

## 2017-05-27 DIAGNOSIS — I10 Essential (primary) hypertension: Secondary | ICD-10-CM | POA: Insufficient documentation

## 2017-05-27 DIAGNOSIS — F17210 Nicotine dependence, cigarettes, uncomplicated: Secondary | ICD-10-CM | POA: Diagnosis not present

## 2017-05-27 DIAGNOSIS — E119 Type 2 diabetes mellitus without complications: Secondary | ICD-10-CM | POA: Insufficient documentation

## 2017-05-27 LAB — BASIC METABOLIC PANEL
ANION GAP: 8 (ref 5–15)
BUN: 9 mg/dL (ref 6–20)
CALCIUM: 9.4 mg/dL (ref 8.9–10.3)
CO2: 24 mmol/L (ref 22–32)
CREATININE: 1.05 mg/dL — AB (ref 0.44–1.00)
Chloride: 103 mmol/L (ref 101–111)
GFR calc Af Amer: >60 mL/min (ref >60)
GFR calc non Af Amer: 53 mL/min — ABNORMAL LOW (ref >60)
GLUCOSE: 120 mg/dL — AB (ref 65–99)
Potassium: 4.4 mmol/L (ref 3.5–5.1)
Sodium: 135 mmol/L (ref 135–145)

## 2017-05-27 LAB — CBC
HCT: 43.5 % (ref 36.0–46.0)
HEMOGLOBIN: 13.9 g/dL (ref 12.0–15.0)
MCH: 29.3 pg (ref 26.0–34.0)
MCHC: 32 g/dL (ref 30.0–36.0)
MCV: 91.8 fL (ref 78.0–100.0)
Platelets: 389 10*3/uL (ref 150–400)
RBC: 4.74 MIL/uL (ref 3.87–5.11)
RDW: 13.7 % (ref 11.5–15.5)
WBC: 7.3 10*3/uL (ref 4.0–10.5)

## 2017-05-27 LAB — URINALYSIS, ROUTINE W REFLEX MICROSCOPIC
BILIRUBIN URINE: NEGATIVE
Glucose, UA: NEGATIVE mg/dL
HGB URINE DIPSTICK: NEGATIVE
KETONES UR: NEGATIVE mg/dL
Leukocytes, UA: NEGATIVE
Nitrite: NEGATIVE
PH: 5 (ref 5.0–8.0)
Protein, ur: NEGATIVE mg/dL
SPECIFIC GRAVITY, URINE: 1.005 (ref 1.005–1.030)

## 2017-05-27 MED ORDER — MECLIZINE HCL 12.5 MG PO TABS: 12.5000 mg | ORAL_TABLET | Freq: Three times a day (TID) | ORAL | 0 refills | Status: AC

## 2017-05-27 MED ORDER — SODIUM CHLORIDE 0.9 % IV BOLUS (SEPSIS)
1000.0000 mL | Freq: Once | INTRAVENOUS | Status: AC
  Administered 2017-05-27: 1000 mL via INTRAVENOUS

## 2017-05-27 MED ORDER — MECLIZINE HCL 25 MG PO TABS
25.0000 mg | ORAL_TABLET | Freq: Once | ORAL | Status: AC
  Administered 2017-05-27: 25 mg via ORAL
  Filled 2017-05-27: qty 1

## 2017-05-27 NOTE — ED Provider Notes (Signed)
[facility] DEPT Provider Note   CSN: [id number] Arrival date & time: 05/27/17  1135     History   Chief Complaint Chief Complaint  Patient presents with  . Dizziness  . Headache    HPI [name] is a 71 y.o. female.  HPI  Patient presents with concern of headache, dizziness. Patient acknowledges a history of prior dizziness, and headache, but states that she does not typically have headache, and certainly does not headache with dizziness. She awoke this way, and was in her usual state of health yesterday, which went to bed. Patient denies focal pain, focal weakness anywhere, she also denies confusion, disorientation, does acknowledge ongoing visual discomfort and visual disruption.  No change in the degree of visual disruption of the past few days. Since onset today, the patient has had no clear alleviating orogastric factors, the symptoms Mera be worse with upright positioning.  Patient had cataract surgery 4 weeks ago, has had ongoing necessity for topical eyedrops.   Past Medical History:  Diagnosis Date  . Anxiety   . Arthritis   . Chronic back pain greater than 3 months duration   . Chronic neck pain   . COPD (chronic obstructive pulmonary disease) ([place])   . Diabetes mellitus   . GERD (gastroesophageal reflux disease)   . H/O hiatal hernia   . Headache(784.0)   . Hemorrhoids   . Herniated disc   . History of kidney stones   . Hx of echocardiogram 2010   normal EF  . Hypertension   . Palpitations   . Shortness of breath on exertion 11/10/11   "sometimes"  . Vertigo     Patient Active Problem List   Diagnosis Date Noted  . Vaginal odor 02/10/2017  . Dysuria 02/10/2017  . Osteoarthritis of spine with radiculopathy, cervical region 12/25/2016  . Acute exacerbation of chronic obstructive pulmonary disease (COPD) ([place]) 10/11/2015  . Orthostatic hypotension 06/18/2013  . Numbness and tingling in right hand 11/10/2011    Class: Acute  . Hypertension  11/10/2011  . Cervical disc disease 11/10/2011    Class: Chronic  . DM II (diabetes mellitus, type II), controlled ([place]) 11/10/2011    Class: Chronic    Past Surgical History:  Procedure Laterality Date  . ABDOMINAL HYSTERECTOMY  1997?   "partial"  . ANTERIOR CERVICAL DECOMP/DISCECTOMY FUSION N/A 12/25/2016   Procedure: ANTERIOR CERVICAL DECOMPRESSION/DISCECTOMY FUSION CERVICAL FOUR CERVICAL FIVE;  Surgeon: Ashok Pall, MD;  Location: [facility];  Service: Neurosurgery;  Laterality: N/A;  . CHOLECYSTECTOMY  ~ 2008  . COLONOSCOPY    . FRACTURE SURGERY  2004?   right shoulder  . KNEE ARTHROSCOPY  2004   right  . KNEE SURGERY     right  . neck fusion    . SHOULDER OPEN ROTATOR CUFF REPAIR  07/20/2011   left  . TONSILLECTOMY      OB History    No data available       Home Medications    Prior to Admission medications   Medication Sig Start Date End Date Taking? Authorizing Provider  albuterol (PROVENTIL) (2.5 MG/3ML) 0.083% nebulizer solution USE 1 VIAL EVERY 6 HOURS AS NEEDED FOR SHORTNESS OF BREATH 10/15/15   [provider]  ALPRAZolam Duanne Moron) 0.25 MG tablet Take 0.25 mg by mouth daily.  10/16/14   [provider]  amLODipine (NORVASC) 5 MG tablet Take 5 mg by mouth 2 (two) times daily.     [provider]  cloNIDine (CATAPRES) 0.1 MG  tablet Take 0.1 mg by mouth 2 (two) times daily as needed (high blood pressure).     [provider]  conjugated estrogens (PREMARIN) vaginal cream Use 1/2 applicator 3 times weekly. 02/11/17   Chancy Milroy, MD  cyclobenzaprine (FLEXERIL) 10 MG tablet Take 10 mg by mouth at bedtime as needed for muscle spasms.    [provider]  dicyclomine (BENTYL) 20 MG tablet Take 1 tablet (20 mg total) by mouth 2 (two) times daily with a meal. 02/27/17 03/09/17  Duffy Bruce, MD  hydrALAZINE (APRESOLINE) 50 MG tablet Take 50 mg by mouth 3 (three) times daily.     [provider]  HYDROcodone-acetaminophen  (NORCO/VICODIN) 5-325 MG tablet Take 1 tablet by mouth every 6 (six) hours as needed for moderate pain. 12/25/16   Ashok Pall, MD  metFORMIN (GLUCOPHAGE) 500 MG tablet Take 500 mg by mouth 2 (two) times daily. 01/06/14   [provider]  metoprolol (LOPRESSOR) 50 MG tablet Take 25 mg by mouth 2 (two) times daily as needed (for palpitation).     [provider]  ondansetron (ZOFRAN ODT) 4 MG disintegrating tablet Take 1 tablet (4 mg total) by mouth every 8 (eight) hours as needed for nausea or vomiting. 02/27/17   Duffy Bruce, MD  potassium chloride SA (K-DUR,KLOR-CON) 20 MEQ tablet Take 2 tablets (40 mEq total) by mouth once. 02/28/17 02/28/17  Duffy Bruce, MD  PROAIR HFA 108 (90 BASE) MCG/ACT inhaler INHALE 2 PUFFS EVERY 4 TO 6 HOURS AS NEEDED FOR SHORTNESS OF BREATH 09/23/15   [provider]  rosuvastatin (CRESTOR) 10 MG tablet Take 10 mg by mouth daily.    [provider]  sodium chloride (OCEAN) 0.65 % SOLN nasal spray Place 1 spray into both nostrils as needed for congestion. Patient not taking: Reported on 09/30/2016 10/06/15   Ashley Murrain, NP    Family History No family history on file.  Social History Social History  Substance Use Topics  . Smoking status: Current Some Day Smoker    Packs/day: 0.25    Types: Cigarettes  . Smokeless tobacco: Never Used     Comment: 4-5 cigs / day  . Alcohol use No     Allergies   Oxycodone   Review of Systems Review of Systems  Constitutional:       Per HPI, otherwise negative  HENT:       Per HPI, otherwise negative  Eyes: Positive for pain, redness and visual disturbance.  Respiratory:       Per HPI, otherwise negative  Cardiovascular:       Per HPI, otherwise negative  Gastrointestinal: Negative for vomiting.  Endocrine:       Negative aside from HPI  Genitourinary:       Neg aside from HPI   Musculoskeletal:       Per HPI, otherwise negative  Skin: Negative.   Neurological: Positive  for dizziness and headaches. Negative for syncope and weakness.     Physical Exam Updated Vital Signs BP 122/67   Pulse 60   Temp 98.1 F (36.7 C) (Oral)   Resp 19   Ht 5\' 7"  (1.702 m)   Wt 97.1 kg (214 lb)   SpO2 100%   BMI 33.52 kg/m   Physical Exam  Constitutional: She is oriented to person, place, and time. She appears well-developed and well-nourished. No distress.  HENT:  Head: Normocephalic and atraumatic.  Eyes: Conjunctivae and EOM are normal.  Cardiovascular: Normal rate and  regular rhythm.   Pulmonary/Chest: Effort normal and breath sounds normal. No stridor. No respiratory distress.  Abdominal: She exhibits no distension.  Musculoskeletal: She exhibits no edema.  Neurological: She is alert and oriented to person, place, and time. She displays no atrophy and no tremor. No cranial nerve deficit or sensory deficit. She exhibits normal muscle tone. She displays no seizure activity. Coordination normal.  Skin: Skin is warm and dry.  Psychiatric: She has a normal mood and affect.  Nursing note and vitals reviewed.    ED Treatments / Results  Labs (all labs ordered are listed, but only abnormal results are displayed) Labs Reviewed  BASIC METABOLIC PANEL - Abnormal; Notable for the following:       Result Value   Glucose, Bld 120 (*)    Creatinine, Ser 1.05 (*)    GFR calc non Af Amer 53 (*)    All other components within normal limits  URINALYSIS, ROUTINE W REFLEX MICROSCOPIC - Abnormal; Notable for the following:    APPearance HAZY (*)    All other components within normal limits  CBC  CBG MONITORING, ED    EKG  EKG Interpretation  Date/Time:  Thursday May 27 2017 12:15:51 EDT Ventricular Rate:  69 PR Interval:  186 QRS Duration: 78 QT Interval:  420 QTC Calculation: 450 R Axis:   -20 Text Interpretation:  Normal sinus rhythm Septal infarct , age undetermined No significant change since last tracing Borderline ECG Confirmed by Carmin Muskrat [phone])  on 05/27/2017 1:48:43 PM       Radiology Ct Head Wo Contrast  Result Date: 05/27/2017 CLINICAL DATA:  Atypical headache.  Dizziness EXAM: CT HEAD WITHOUT CONTRAST TECHNIQUE: Contiguous axial images were obtained from the base of the skull through the vertex without intravenous contrast. COMPARISON:  05/03/2016 FINDINGS: Brain: No evidence of acute infarction, hemorrhage, hydrocephalus, extra-axial collection or mass lesion/mass effect. Normal appearance of the brain. Partially empty sella, chronic and considered incidental in this setting. Vascular: Mild arterial calcification. Skull: No acute or aggressive finding. Sinuses/Orbits: Chronic mild patchy mucosal thickening in the ethmoids. Left cataract resection. No acute finding. IMPRESSION: No acute finding or change from 1 year prior. Electronically Signed   By: Monte Fantasia M.D.   On: 05/27/2017 14:57    Procedures Procedures (including critical care time)  Medications Ordered in ED Medications  meclizine (ANTIVERT) tablet 25 mg (25 mg Oral Given 05/27/17 1424)  sodium chloride 0.9 % bolus 1,000 mL (1,000 mLs Intravenous New Bag/Given 05/27/17 1439)     Initial Impression / Assessment and Plan / ED Course  I have reviewed the triage vital signs and the nursing notes.  Pertinent labs & imaging results that were available during my care of the patient were reviewed by me and considered in my medical decision making (see chart for details).  4:18 PM Patient feels better.  She has no dizziness. She has a Hx of vertigo, and has been on Meclizine. Patient will be restarted on meclizine, and given the resolution of symptoms, and her reassuring findings, no evidence for stroke, arrhythmia, ACS, will be discharged in stable condition.  Final Clinical Impressions(s) / ED Diagnoses   Final diagnoses:  Dizziness    New Prescriptions New Prescriptions   MECLIZINE (ANTIVERT) 12.5 MG TABLET    Take 1 tablet (12.5 mg total) by mouth 3 (three)  times daily.     Carmin Muskrat, MD 05/27/17 [phone]

## 2017-05-27 NOTE — Discharge Instructions (Signed)
As discussed, your evaluation today has been largely reassuring.  But, it is important that you monitor your condition carefully, and do not hesitate to return to the ED if you develop new, or concerning changes in your condition. ? ?Otherwise, please follow-up with your physician for appropriate ongoing care. ? ?

## 2017-05-27 NOTE — ED Triage Notes (Signed)
Pt. Stated, I woke up with dizziness and a headache more than usual. Ive had dizziness before.

## 2017-05-27 NOTE — ED Notes (Signed)
Pt placed on bedpan

## 2017-07-05 ENCOUNTER — Other Ambulatory Visit: Payer: Self-pay | Admitting: Cardiovascular Disease

## 2017-07-05 ENCOUNTER — Ambulatory Visit
Admission: RE | Admit: 2017-07-05 | Discharge: 2017-07-05 | Disposition: A | Discharge status: Home / Self Care | Payer: Medicare HMO | Source: Ambulatory Visit | Attending: Cardiovascular Disease | Admitting: Cardiovascular Disease

## 2017-07-05 ENCOUNTER — Observation Stay (HOSPITAL_COMMUNITY): Payer: Medicare HMO

## 2017-07-05 ENCOUNTER — Observation Stay (HOSPITAL_COMMUNITY)
Admission: AD | Admit: 2017-07-05 | Discharge: 2017-07-07 | Disposition: A | Discharge status: Home / Self Care | Payer: Medicare HMO | Source: Ambulatory Visit | Attending: Cardiovascular Disease | Admitting: Cardiovascular Disease

## 2017-07-05 DIAGNOSIS — I129 Hypertensive chronic kidney disease with stage 1 through stage 4 chronic kidney disease, or unspecified chronic kidney disease: Secondary | ICD-10-CM | POA: Diagnosis not present

## 2017-07-05 DIAGNOSIS — F17210 Nicotine dependence, cigarettes, uncomplicated: Secondary | ICD-10-CM | POA: Diagnosis not present

## 2017-07-05 DIAGNOSIS — K219 Gastro-esophageal reflux disease without esophagitis: Secondary | ICD-10-CM | POA: Diagnosis not present

## 2017-07-05 DIAGNOSIS — R059 Cough, unspecified: Secondary | ICD-10-CM

## 2017-07-05 DIAGNOSIS — E1122 Type 2 diabetes mellitus with diabetic chronic kidney disease: Secondary | ICD-10-CM | POA: Diagnosis not present

## 2017-07-05 DIAGNOSIS — R062 Wheezing: Secondary | ICD-10-CM

## 2017-07-05 DIAGNOSIS — J440 Chronic obstructive pulmonary disease with acute lower respiratory infection: Secondary | ICD-10-CM | POA: Insufficient documentation

## 2017-07-05 DIAGNOSIS — Z90710 Acquired absence of both cervix and uterus: Secondary | ICD-10-CM | POA: Diagnosis not present

## 2017-07-05 DIAGNOSIS — R05 Cough: Secondary | ICD-10-CM

## 2017-07-05 DIAGNOSIS — R0602 Shortness of breath: Secondary | ICD-10-CM | POA: Diagnosis present

## 2017-07-05 DIAGNOSIS — R918 Other nonspecific abnormal finding of lung field: Secondary | ICD-10-CM | POA: Diagnosis not present

## 2017-07-05 DIAGNOSIS — Z9049 Acquired absence of other specified parts of digestive tract: Secondary | ICD-10-CM | POA: Insufficient documentation

## 2017-07-05 DIAGNOSIS — Z6834 Body mass index (BMI) 34.0-34.9, adult: Secondary | ICD-10-CM | POA: Diagnosis not present

## 2017-07-05 DIAGNOSIS — R0600 Dyspnea, unspecified: Secondary | ICD-10-CM | POA: Diagnosis present

## 2017-07-05 DIAGNOSIS — Z79899 Other long term (current) drug therapy: Secondary | ICD-10-CM | POA: Diagnosis not present

## 2017-07-05 DIAGNOSIS — J209 Acute bronchitis, unspecified: Secondary | ICD-10-CM | POA: Diagnosis not present

## 2017-07-05 DIAGNOSIS — F419 Anxiety disorder, unspecified: Secondary | ICD-10-CM | POA: Diagnosis not present

## 2017-07-05 DIAGNOSIS — E669 Obesity, unspecified: Secondary | ICD-10-CM | POA: Insufficient documentation

## 2017-07-05 DIAGNOSIS — Z885 Allergy status to narcotic agent status: Secondary | ICD-10-CM | POA: Insufficient documentation

## 2017-07-05 DIAGNOSIS — E119 Type 2 diabetes mellitus without complications: Secondary | ICD-10-CM

## 2017-07-05 DIAGNOSIS — Z7984 Long term (current) use of oral hypoglycemic drugs: Secondary | ICD-10-CM | POA: Diagnosis not present

## 2017-07-05 HISTORY — DX: Dyspnea, unspecified: R06.00

## 2017-07-05 LAB — COMPREHENSIVE METABOLIC PANEL
ALT: 11 U/L — ABNORMAL LOW (ref 14–54)
AST: 20 U/L (ref 15–41)
Albumin: 3.5 g/dL (ref 3.5–5.0)
Alkaline Phosphatase: 112 U/L (ref 38–126)
Anion gap: 8 (ref 5–15)
BUN: 11 mg/dL (ref 6–20)
CO2: 27 mmol/L (ref 22–32)
Calcium: 9.1 mg/dL (ref 8.9–10.3)
Chloride: 105 mmol/L (ref 101–111)
Creatinine, Ser: 1.18 mg/dL — ABNORMAL HIGH (ref 0.44–1.00)
GFR calc Af Amer: 53 mL/min — ABNORMAL LOW (ref >60)
GFR calc non Af Amer: 46 mL/min — ABNORMAL LOW (ref >60)
Glucose, Bld: 149 mg/dL — ABNORMAL HIGH (ref 65–99)
Potassium: 3.1 mmol/L — ABNORMAL LOW (ref 3.5–5.1)
Sodium: 140 mmol/L (ref 135–145)
Total Bilirubin: 0.4 mg/dL (ref 0.3–1.2)
Total Protein: 6.8 g/dL (ref 6.5–8.1)

## 2017-07-05 LAB — URINALYSIS, ROUTINE W REFLEX MICROSCOPIC
Bilirubin Urine: NEGATIVE
Glucose, UA: NEGATIVE mg/dL
Ketones, ur: NEGATIVE mg/dL
Leukocytes, UA: NEGATIVE
Nitrite: NEGATIVE
Protein, ur: 100 mg/dL — AB
Specific Gravity, Urine: 1.017 (ref 1.005–1.030)
pH: 5 (ref 5.0–8.0)

## 2017-07-05 LAB — CBC WITH DIFFERENTIAL/PLATELET
BASOS PCT: 1 %
Basophils Absolute: 0.1 10*3/uL (ref 0.0–0.1)
Eosinophils Absolute: 0.5 10*3/uL (ref 0.0–0.7)
Eosinophils Relative: 5 %
HEMATOCRIT: 38.9 % (ref 36.0–46.0)
HEMOGLOBIN: 13 g/dL (ref 12.0–15.0)
LYMPHS ABS: 4 10*3/uL (ref 0.7–4.0)
LYMPHS PCT: 41 %
MCH: 29.4 pg (ref 26.0–34.0)
MCHC: 33.4 g/dL (ref 30.0–36.0)
MCV: 88 fL (ref 78.0–100.0)
MONOS PCT: 5 %
Monocytes Absolute: 0.5 10*3/uL (ref 0.1–1.0)
NEUTROS ABS: 4.7 10*3/uL (ref 1.7–7.7)
NEUTROS PCT: 48 %
PLATELETS: 349 10*3/uL (ref 150–400)
RBC: 4.42 MIL/uL (ref 3.87–5.11)
RDW: 13.1 % (ref 11.5–15.5)
WBC: 9.8 10*3/uL (ref 4.0–10.5)

## 2017-07-05 MED ORDER — CLONIDINE HCL 0.1 MG PO TABS: 0.1000 mg | ORAL_TABLET | Freq: Two times a day (BID) | ORAL | Status: DC | PRN

## 2017-07-05 MED ORDER — ALPRAZOLAM 0.25 MG PO TABS
0.2500 mg | ORAL_TABLET | Freq: Every day | ORAL | Status: DC | PRN
  Administered 2017-07-06 – 2017-07-07 (×2): 0.25 mg via ORAL
  Filled 2017-07-05 (×2): qty 1

## 2017-07-05 MED ORDER — SITAGLIPTIN PHOS-METFORMIN HCL 50-500 MG PO TABS: 1.0000 | ORAL_TABLET | Freq: Every day | ORAL | Status: DC

## 2017-07-05 MED ORDER — AZITHROMYCIN 500 MG PO TABS
500.0000 mg | ORAL_TABLET | Freq: Every day | ORAL | Status: AC
  Administered 2017-07-05: 500 mg via ORAL
  Filled 2017-07-05: qty 1

## 2017-07-05 MED ORDER — HYDROCODONE-ACETAMINOPHEN 5-325 MG PO TABS
2.0000 | ORAL_TABLET | Freq: Four times a day (QID) | ORAL | Status: DC | PRN
  Administered 2017-07-05 – 2017-07-06 (×4): 2 via ORAL
  Filled 2017-07-05 (×5): qty 2

## 2017-07-05 MED ORDER — IOPAMIDOL (ISOVUE-300) INJECTION 61%
INTRAVENOUS | Status: AC
  Administered 2017-07-05: 75 mL
  Filled 2017-07-05: qty 75

## 2017-07-05 MED ORDER — METFORMIN HCL 500 MG PO TABS
500.0000 mg | ORAL_TABLET | Freq: Every day | ORAL | Status: DC
  Filled 2017-07-05 (×2): qty 1

## 2017-07-05 MED ORDER — POTASSIUM CHLORIDE CRYS ER 10 MEQ PO TBCR
10.0000 meq | EXTENDED_RELEASE_TABLET | Freq: Two times a day (BID) | ORAL | Status: DC
  Administered 2017-07-06: 10 meq via ORAL
  Filled 2017-07-05: qty 1

## 2017-07-05 MED ORDER — ALBUTEROL SULFATE (2.5 MG/3ML) 0.083% IN NEBU
2.5000 mg | INHALATION_SOLUTION | Freq: Four times a day (QID) | RESPIRATORY_TRACT | Status: DC | PRN
  Administered 2017-07-05 – 2017-07-07 (×3): 2.5 mg via RESPIRATORY_TRACT
  Filled 2017-07-05 (×3): qty 3

## 2017-07-05 MED ORDER — LOSARTAN POTASSIUM 50 MG PO TABS: 100.0000 mg | ORAL_TABLET | Freq: Every day | ORAL | Status: DC

## 2017-07-05 MED ORDER — CYCLOBENZAPRINE HCL 10 MG PO TABS
10.0000 mg | ORAL_TABLET | Freq: Every evening | ORAL | Status: DC | PRN
  Administered 2017-07-05: 10 mg via ORAL
  Filled 2017-07-05: qty 1

## 2017-07-05 MED ORDER — ONDANSETRON 4 MG PO TBDP: 4.0000 mg | ORAL_TABLET | Freq: Three times a day (TID) | ORAL | Status: DC | PRN

## 2017-07-05 MED ORDER — LINAGLIPTIN 5 MG PO TABS
5.0000 mg | ORAL_TABLET | Freq: Every day | ORAL | Status: DC
  Administered 2017-07-07: 5 mg via ORAL
  Filled 2017-07-05 (×2): qty 1

## 2017-07-05 MED ORDER — AZITHROMYCIN 500 MG PO TABS
250.0000 mg | ORAL_TABLET | Freq: Every day | ORAL | Status: DC
  Administered 2017-07-06: 250 mg via ORAL
  Filled 2017-07-05: qty 1

## 2017-07-05 MED ORDER — AMLODIPINE BESYLATE 5 MG PO TABS
5.0000 mg | ORAL_TABLET | Freq: Two times a day (BID) | ORAL | Status: DC
  Administered 2017-07-05 – 2017-07-07 (×4): 5 mg via ORAL
  Filled 2017-07-05 (×4): qty 1

## 2017-07-05 MED ORDER — HEPARIN SODIUM (PORCINE) 5000 UNIT/ML IJ SOLN
5000.0000 [IU] | Freq: Three times a day (TID) | INTRAMUSCULAR | Status: DC
  Administered 2017-07-05 – 2017-07-07 (×5): 5000 [IU] via SUBCUTANEOUS
  Filled 2017-07-05 (×5): qty 1

## 2017-07-05 MED ORDER — METOPROLOL SUCCINATE ER 25 MG PO TB24
25.0000 mg | ORAL_TABLET | Freq: Every day | ORAL | Status: DC
  Administered 2017-07-06 – 2017-07-07 (×2): 25 mg via ORAL
  Filled 2017-07-05 (×3): qty 1

## 2017-07-05 MED ORDER — ALBUTEROL SULFATE HFA 108 (90 BASE) MCG/ACT IN AERS: 2.0000 | INHALATION_SPRAY | Freq: Four times a day (QID) | RESPIRATORY_TRACT | Status: DC | PRN

## 2017-07-05 MED ORDER — ROSUVASTATIN CALCIUM 10 MG PO TABS
10.0000 mg | ORAL_TABLET | Freq: Every day | ORAL | Status: DC
  Administered 2017-07-06 – 2017-07-07 (×2): 10 mg via ORAL
  Filled 2017-07-05 (×2): qty 1

## 2017-07-05 NOTE — Progress Notes (Signed)
Pt home meds not verified. Pharmacy called and notified.

## 2017-07-05 NOTE — H&P (Signed)
Referring Physician:  Xochitl F White is an 71 y.o. female.                       Chief Complaint: Shortness of breath and cough  HPI: 71 year old female with 1 week history of shortness of breath and cough without fever has PMH of DM, II, hypertension, Obesity, COPD, Headaches, Chronic neck and back pain. Her chest x-ray showed right apical focal opacity. She has long standing history of smoking.  Past Medical History:  Diagnosis Date  . Anxiety   . Arthritis   . Chronic back pain greater than 3 months duration   . Chronic neck pain   . COPD (chronic obstructive pulmonary disease) ([place])   . Diabetes mellitus   . GERD (gastroesophageal reflux disease)   . H/O hiatal hernia   . Headache(784.0)   . Hemorrhoids   . Herniated disc   . History of kidney stones   . Hx of echocardiogram 2010   normal EF  . Hypertension   . Palpitations   . Shortness of breath on exertion 11/10/11   "sometimes"  . Vertigo       Past Surgical History:  Procedure Laterality Date  . ABDOMINAL HYSTERECTOMY  1997?   "partial"  . ANTERIOR CERVICAL DECOMP/DISCECTOMY FUSION N/A 12/25/2016   Procedure: ANTERIOR CERVICAL DECOMPRESSION/DISCECTOMY FUSION CERVICAL FOUR CERVICAL FIVE;  Surgeon: Ashok Pall, MD;  Location: [facility];  Service: Neurosurgery;  Laterality: N/A;  . CHOLECYSTECTOMY  ~ 2008  . COLONOSCOPY    . FRACTURE SURGERY  2004?   right shoulder  . KNEE ARTHROSCOPY  2004   right  . KNEE SURGERY     right  . neck fusion    . SHOULDER OPEN ROTATOR CUFF REPAIR  07/20/2011   left  . TONSILLECTOMY      No family history on file. Social History:  reports that she has been smoking Cigarettes.  She has been smoking about 0.25 packs per day. She has never used smokeless tobacco. She reports that she does not drink alcohol or use drugs.  Allergies:  Allergies  Allergen Reactions  . Oxycodone Itching    Medications Prior to Admission  Medication Sig Dispense Refill  . albuterol (PROVENTIL) (2.5  MG/3ML) 0.083% nebulizer solution USE 1 VIAL EVERY 6 HOURS AS NEEDED FOR SHORTNESS OF BREATH  0  . albuterol (VENTOLIN HFA) 108 (90 Base) MCG/ACT inhaler Inhale 2 puffs into the lungs every 6 (six) hours as needed for wheezing or shortness of breath.    . ALPRAZolam (XANAX) 0.25 MG tablet Take 0.25 mg by mouth daily as needed for anxiety.   2  . amLODipine (NORVASC) 5 MG tablet Take 5 mg by mouth 2 (two) times daily.     Marland Kitchen atropine 1 % ophthalmic solution Place 1 drop into the left eye 2 (two) times daily.  0  . cloNIDine (CATAPRES) 0.1 MG tablet Take 0.1 mg by mouth 2 (two) times daily as needed (high blood pressure).     . conjugated estrogens (PREMARIN) vaginal cream Use 1/2 applicator 3 times weekly. 42.5 g 12  . cyclobenzaprine (FLEXERIL) 10 MG tablet Take 10 mg by mouth at bedtime as needed for muscle spasms.    Marland Kitchen dicyclomine (BENTYL) 20 MG tablet Take 1 tablet (20 mg total) by mouth 2 (two) times daily with a meal. (Patient not taking: Reported on 05/27/2017) 20 tablet 0  . gabapentin (NEURONTIN) 100 MG capsule Take 100 mg by  mouth 3 (three) times daily as needed (for leg pain).   1  . hydrALAZINE (APRESOLINE) 50 MG tablet Take 50 mg by mouth 3 (three) times daily.     Marland Kitchen JANUMET 50-500 MG tablet Take 1 tablet by mouth daily.  6  . KLOR-CON M10 10 MEQ tablet Take 10 mEq by mouth 2 (two) times daily.  3  . losartan (COZAAR) 100 MG tablet Take 100 mg by mouth daily.  1  . metoprolol succinate (TOPROL-XL) 25 MG 24 hr tablet Take 25 mg by mouth daily.  1  . naproxen sodium (ALEVE) 220 MG tablet Take 220-440 mg by mouth 2 (two) times daily as needed (for pain).    . ondansetron (ZOFRAN ODT) 4 MG disintegrating tablet Take 1 tablet (4 mg total) by mouth every 8 (eight) hours as needed for nausea or vomiting. (Patient taking differently: Take 4 mg by mouth every 8 (eight) hours as needed for nausea or vomiting. DISSOLVE IN THE MOUTH) 20 tablet 0  . potassium chloride SA (K-DUR,KLOR-CON) 20 MEQ tablet  Take 2 tablets (40 mEq total) by mouth once. (Patient not taking: Reported on 05/27/2017) 2 tablet 0  . prednisoLONE acetate (PRED FORTE) 1 % ophthalmic suspension Place 1 drop into the left eye 4 (four) times daily.  2  . Pseudoephed-APAP-Guaifenesin (TYLENOL SINUS SEVERE CONGEST) 30-325-200 MG TABS Take 1 tablet by mouth every 6 (six) hours as needed (for congestion and sinus pain).    . rosuvastatin (CRESTOR) 10 MG tablet Take 10 mg by mouth daily.    . sodium chloride (OCEAN) 0.65 % SOLN nasal spray Place 1 spray into both nostrils as needed for congestion. (Patient not taking: Reported on 05/27/2017) 1 Bottle 0  . [DISCONTINUED] HYDROcodone-acetaminophen (NORCO/VICODIN) 5-325 MG tablet Take 1 tablet by mouth every 6 (six) hours as needed for moderate pain. (Patient not taking: Reported on 05/27/2017) 30 tablet 0    No results found for this or any previous visit (from the past 48 hour(s)). Dg Chest 2 View  Result Date: 07/05/2017 CLINICAL DATA:  Cough, wheezing, back pain, COPD, smoker EXAM: CHEST  2 VIEW COMPARISON:  02/27/2017 FINDINGS: Normal heart size, mediastinal contours, and pulmonary vascularity. Atherosclerotic calcification aorta. Slight increase in size of a focal opacity at the RIGHT apex highly concerning for a pulmonary neoplasm 25 x 17 mm in size. Remaining lungs clear. No pleural effusion or pneumothorax. Prior cervical spine fusion. Bones otherwise unremarkable. IMPRESSION: Slight increase in size of an opacity at the RIGHT apex now 25 x 17 mm in size highly concerning for a pulmonary neoplasm ; CT chest with contrast recommended for further evaluation. These results will be called to the ordering clinician or representative by the Radiologist Assistant, and communication documented in the PACS or zVision Dashboard. Electronically Signed   By: Lavonia Dana M.D.   On: 07/05/2017 16:06    Review Of Systems Constitutional: No fever, chills, weight loss or gain. Eyes: No vision change,  wears glasses. No discharge or pain. Ears: No hearing loss, No tinnitus. Respiratory: No asthma, positive COPD, pneumonias, shortness of breath. No hemoptysis. Cardiovascular: Positive chest pain, palpitation, leg edema. Gastrointestinal: No nausea, vomiting, diarrhea, constipation. No GI bleed. No hepatitis. Genitourinary: No dysuria, hematuria, kidney stone. No incontinance. Neurological: Positive headache, no stroke, seizures.  Psychiatry: No psych facility admission for anxiety, depression, suicide. No detox. Skin: No rash. Musculoskeletal: Positive joint pain, no fibromyalgia. positive neck pain, back pain. Lymphadenopathy: No lymphadenopathy. Hematology: No anemia or easy bruising.  Blood pressure 130/71, pulse 78, resp. rate 18, SpO2 98 %. There is no height or weight on file to calculate BMI. General appearance: alert, cooperative, appears stated age and no distress Head: Normocephalic, atraumatic. Eyes: Brown eyes, pink conjunctiva, corneas clear. PERRL, EOM's intact. Neck: No adenopathy, no carotid bruit, no JVD, supple, symmetrical, trachea midline and thyroid not enlarged. Resp: Coarse crackles and wheezing auscultation bilaterally. Cardio: Regular rate and rhythm, S1, S2 normal, II/VI systolic murmur, no click, rub or gallop GI: Soft, non-tender; bowel sounds normal; no organomegaly. Extremities: No edema, cyanosis or clubbing. Skin: Warm and dry.  Neurologic: Alert and oriented X 3, normal strength. Normal coordination and gait.  Assessment/Plan Shortness of breath Right apical lung mass r/o cancer Hypertension DM, II Obesity Tobacco use disorder  Place in observation. CT of chest. Home medications. Z-pak.  Kirsten Riddle, MD  07/05/2017, 8:02 PM

## 2017-07-06 ENCOUNTER — Observation Stay (HOSPITAL_COMMUNITY): Payer: Medicare HMO

## 2017-07-06 ENCOUNTER — Encounter (HOSPITAL_COMMUNITY): Payer: Self-pay | Admitting: General Practice

## 2017-07-06 DIAGNOSIS — R911 Solitary pulmonary nodule: Secondary | ICD-10-CM

## 2017-07-06 DIAGNOSIS — J209 Acute bronchitis, unspecified: Secondary | ICD-10-CM | POA: Diagnosis not present

## 2017-07-06 LAB — BASIC METABOLIC PANEL
ANION GAP: 7 (ref 5–15)
BUN: 14 mg/dL (ref 6–20)
CALCIUM: 8.8 mg/dL — AB (ref 8.9–10.3)
CO2: 30 mmol/L (ref 22–32)
Chloride: 103 mmol/L (ref 101–111)
Creatinine, Ser: 1.22 mg/dL — ABNORMAL HIGH (ref 0.44–1.00)
GFR, EST AFRICAN AMERICAN: 51 mL/min — AB (ref >60)
GFR, EST NON AFRICAN AMERICAN: 44 mL/min — AB (ref >60)
Glucose, Bld: 143 mg/dL — ABNORMAL HIGH (ref 65–99)
POTASSIUM: 3.2 mmol/L — AB (ref 3.5–5.1)
SODIUM: 140 mmol/L (ref 135–145)

## 2017-07-06 LAB — PROTIME-INR
INR: 0.95
PROTHROMBIN TIME: 12.6 s (ref 11.4–15.2)

## 2017-07-06 LAB — CBC
HEMATOCRIT: 38.6 % (ref 36.0–46.0)
HEMOGLOBIN: 12.8 g/dL (ref 12.0–15.0)
MCH: 29.4 pg (ref 26.0–34.0)
MCHC: 33.2 g/dL (ref 30.0–36.0)
MCV: 88.7 fL (ref 78.0–100.0)
Platelets: 354 10*3/uL (ref 150–400)
RBC: 4.35 MIL/uL (ref 3.87–5.11)
RDW: 13.1 % (ref 11.5–15.5)
WBC: 9.9 10*3/uL (ref 4.0–10.5)

## 2017-07-06 MED ORDER — TECHNETIUM TC 99M TETROFOSMIN IV KIT
10.0000 | PACK | Freq: Once | INTRAVENOUS | Status: AC | PRN
  Administered 2017-07-06: 10 via INTRAVENOUS

## 2017-07-06 MED ORDER — REGADENOSON 0.4 MG/5ML IV SOLN
0.4000 mg | Freq: Once | INTRAVENOUS | Status: AC
  Administered 2017-07-06: 0.4 mg via INTRAVENOUS
  Filled 2017-07-06: qty 5

## 2017-07-06 MED ORDER — POTASSIUM CHLORIDE IN NACL 40-0.9 MEQ/L-% IV SOLN
INTRAVENOUS | Status: DC
  Administered 2017-07-06: 50 mL/h via INTRAVENOUS
  Filled 2017-07-06 (×3): qty 1000

## 2017-07-06 MED ORDER — POTASSIUM CHLORIDE CRYS ER 20 MEQ PO TBCR
20.0000 meq | EXTENDED_RELEASE_TABLET | Freq: Two times a day (BID) | ORAL | Status: DC
  Administered 2017-07-06 – 2017-07-07 (×3): 20 meq via ORAL
  Filled 2017-07-06 (×3): qty 1

## 2017-07-06 MED ORDER — NICOTINE 21 MG/24HR TD PT24
21.0000 mg | MEDICATED_PATCH | Freq: Every day | TRANSDERMAL | Status: DC
  Administered 2017-07-06 – 2017-07-07 (×2): 21 mg via TRANSDERMAL
  Filled 2017-07-06 (×2): qty 1

## 2017-07-06 MED ORDER — REGADENOSON 0.4 MG/5ML IV SOLN
INTRAVENOUS | Status: AC
  Filled 2017-07-06: qty 5

## 2017-07-06 MED ORDER — TECHNETIUM TC 99M TETROFOSMIN IV KIT
30.0000 | PACK | Freq: Once | INTRAVENOUS | Status: AC | PRN
  Administered 2017-07-06: 30 via INTRAVENOUS

## 2017-07-06 NOTE — Progress Notes (Signed)
Pt has metformin scheduled by pharmacy, had IV contrast last night, holding metformin for 48 hours, no insulin no CBG ordered. UA positive.  Sticky note left for physician.

## 2017-07-06 NOTE — Progress Notes (Signed)
Patient back from [place]. Alert and oriented x 4; low pain level. Will continue to monitor.

## 2017-07-06 NOTE — Progress Notes (Signed)
Ref: Dixie Dials, MD   Subjective:  VS stable. Decreased cough and wheezing. No revesible ischemia on nuclear stress test.  Objective:  Vital Signs in the last 24 hours: Temp:  [97.9 F (36.6 C)-98.7 F (37.1 C)] 97.9 F (36.6 C) (08/14 1442) Pulse Rate:  [63-78] 65 (08/14 1442) Cardiac Rhythm: Normal sinus rhythm (08/14 0700) Resp:  [14-18] 14 (08/14 1442) BP: (120-141)/(58-75) 141/61 (08/14 1442) SpO2:  [94 %-98 %] 98 % (08/14 1442) Weight:  [112.3 kg (247 lb 9.2 oz)-112.5 kg (248 lb 1.6 oz)] 112.3 kg (247 lb 9.2 oz) (08/14 0539)  Physical Exam: BP Readings from Last 1 Encounters:  07/06/17 (!) 141/61    Wt Readings from Last 1 Encounters:  07/06/17 112.3 kg (247 lb 9.2 oz)    Weight change:  Body mass index is 38.78 kg/m. HEENT: [place]/AT, Eyes-Brown, PERL, EOMI, Conjunctiva-Pink, Sclera-Non-icteric Neck: No JVD, No bruit, Trachea midline. Lungs:  Forced expiratory wheeze, Bilateral. Cardiac:  Regular rhythm, normal S1 and S2, no S3. II/VI systolic murmur. Abdomen:  Soft, non-tender. BS present. Extremities:  No edema present. No cyanosis. No clubbing. CNS: AxOx3, Cranial nerves grossly intact, moves all 4 extremities.  Skin: Warm and dry.   Intake/Output from previous day: 08/13 0701 - 08/14 0700 In: 300 [P.O.:300] Out: 350 [Urine:350]    Lab Results: BMET    Component Value Date/Time   NA 140 07/06/2017 0334   NA 140 07/05/2017 2011   NA 135 05/27/2017 1220   K 3.2 (L) 07/06/2017 0334   K 3.1 (L) 07/05/2017 2011   K 4.4 05/27/2017 1220   CL 103 07/06/2017 0334   CL 105 07/05/2017 2011   CL 103 05/27/2017 1220   CO2 30 07/06/2017 0334   CO2 27 07/05/2017 2011   CO2 24 05/27/2017 1220   GLUCOSE 143 (H) 07/06/2017 0334   GLUCOSE 149 (H) 07/05/2017 2011   GLUCOSE 120 (H) 05/27/2017 1220   BUN 14 07/06/2017 0334   BUN 11 07/05/2017 2011   BUN 9 05/27/2017 1220   CREATININE 1.22 (H) 07/06/2017 0334   CREATININE 1.18 (H) 07/05/2017 2011   CREATININE 1.05  (H) 05/27/2017 1220   CALCIUM 8.8 (L) 07/06/2017 0334   CALCIUM 9.1 07/05/2017 2011   CALCIUM 9.4 05/27/2017 1220   GFRNONAA 44 (L) 07/06/2017 0334   GFRNONAA 46 (L) 07/05/2017 2011   GFRNONAA 53 (L) 05/27/2017 1220   GFRAA 51 (L) 07/06/2017 0334   GFRAA 53 (L) 07/05/2017 2011   GFRAA >60 05/27/2017 1220   CBC    Component Value Date/Time   WBC 9.9 07/06/2017 0334   RBC 4.35 07/06/2017 0334   HGB 12.8 07/06/2017 0334   HCT 38.6 07/06/2017 0334   PLT 354 07/06/2017 0334   MCV 88.7 07/06/2017 0334   MCH 29.4 07/06/2017 0334   MCHC 33.2 07/06/2017 0334   RDW 13.1 07/06/2017 0334   LYMPHSABS 4.0 07/05/2017 2011   MONOABS 0.5 07/05/2017 2011   EOSABS 0.5 07/05/2017 2011   BASOSABS 0.1 07/05/2017 2011   HEPATIC Function Panel  Recent Labs  07/05/17 2011  PROT 6.8   HEMOGLOBIN A1C No components found for: HGA1C,  MPG CARDIAC ENZYMES Lab Results  Component Value Date   CKTOTAL 95 01/01/2009   CKMB 0.8 01/01/2009   TROPONINI <0.01        NO INDICATION OF MYOCARDIAL INJURY. 01/01/2009   TROPONINI <0.01        NO INDICATION OF MYOCARDIAL INJURY. 01/01/2009   TROPONINI <0.01  NO INDICATION OF MYOCARDIAL INJURY. 12/31/2008   BNP No results for input(s): PROBNP in the last 8760 hours. TSH No results for input(s): TSH in the last 8760 hours. CHOLESTEROL No results for input(s): CHOL in the last 8760 hours.  Scheduled Meds: . amLODipine  5 mg Oral BID  . azithromycin  250 mg Oral QHS  . heparin  5,000 Units Subcutaneous Q8H  . linagliptin  5 mg Oral Q breakfast   And  . metFORMIN  500 mg Oral Q breakfast  . metoprolol succinate  25 mg Oral Daily  . nicotine  21 mg Transdermal Daily  . potassium chloride  20 mEq Oral BID  . regadenoson      . rosuvastatin  10 mg Oral Daily   Continuous Infusions: . 0.9 % NaCl with KCl 40 mEq / L 50 mL/hr (07/06/17 1234)   PRN Meds:.albuterol, ALPRAZolam, cloNIDine, cyclobenzaprine, HYDROcodone-acetaminophen,  ondansetron  Assessment/Plan: Shortness of breath Right lung mass  Hypertension DM, II Obesity Tobacco use disorder Anxiety  Appreciate pulmonary consult. Start nicotine patch and incentive spirometry. Discharge home in AM. F/U Pulmonary on 07/14/2017   LOS: 0 days    Dixie Dials  MD  07/06/2017, 7:02 PM

## 2017-07-06 NOTE — Consult Note (Signed)
Name: [name] MRN: [id number] DOB: 29-Alanah-1947    ADMISSION DATE:  07/05/2017 CONSULTATION DATE:  8/14  REFERRING MD :  Doylene Canard   CHIEF COMPLAINT:  Lung mass  BRIEF PATIENT DESCRIPTION:  71 year old aaf w/ + tobacco abuse. Admitted w/ URI & cough as well radiographic progression of R apical lung mass.   SIGNIFICANT EVENTS    STUDIES:  CT chest 8/13: 2.6 x 1.9 cm slightly spiculated right upper lobe pulmonary mass with slight central cavitation, given growth on prior radiographs, findings are suspicious for lung neoplasm   HISTORY OF PRESENT ILLNESS:   This is a 71 year old aaf w/ h/o tobacco abuse dating back to her teen years (currently smokes about 1/2 pk/d). She's followed by Dr Doylene Canard and was admitted on 8/13 w/ cc: cough. The cough started about 3d prior to admit. It was associated w/ watery eyes, nasal congestion, sore throat and then worsening cough w/ clear sputum. She denied fever, chest pain, chills or sick exposures. She has a h/o right apical lung mass which was first noted on plain CXR film back April this year. She was admitted for evaluation and treatment of cough as well as RUL apical lung massl   PAST MEDICAL HISTORY :   has a past medical history of Anxiety; Arthritis; Chronic back pain greater than 3 months duration; Chronic neck pain; COPD (chronic obstructive pulmonary disease) ([place]); Diabetes mellitus; Dyspnea; GERD (gastroesophageal reflux disease); H/O hiatal hernia; Headache(784.0); Hemorrhoids; Herniated disc; History of kidney stones; echocardiogram (2010); Hypertension; Palpitations; Shortness of breath on exertion (11/10/11); and Vertigo.  has a past surgical history that includes Knee surgery; Shoulder open rotator cuff repair (07/20/2011); Fracture surgery (2004?); Knee arthroscopy (2004); Tonsillectomy; Cholecystectomy (~ 2008); Abdominal hysterectomy (1997?); neck fusion; Colonoscopy; and Anterior cervical decomp/discectomy fusion (N/A,  12/25/2016). Prior to Admission medications   Medication Sig Start Date End Date Taking? Authorizing Provider  albuterol (PROVENTIL) (2.5 MG/3ML) 0.083% nebulizer solution USE 1 VIAL EVERY 6 HOURS AS NEEDED FOR SHORTNESS OF BREATH 10/15/15  Yes [provider]  albuterol (VENTOLIN HFA) 108 (90 Base) MCG/ACT inhaler Inhale 2 puffs into the lungs every 6 (six) hours as needed for wheezing or shortness of breath.   Yes [provider]  ALPRAZolam (XANAX) 0.25 MG tablet Take 0.25 mg by mouth daily as needed for anxiety.  10/16/14  Yes [provider]  amLODipine (NORVASC) 5 MG tablet Take 5 mg by mouth 2 (two) times daily.    Yes [provider]  cloNIDine (CATAPRES) 0.1 MG tablet Take 0.1 mg by mouth 2 (two) times daily as needed (high blood pressure).    Yes [provider]  conjugated estrogens (PREMARIN) vaginal cream Use 1/2 applicator 3 times weekly. 02/11/17  Yes Chancy Milroy, MD  cyclobenzaprine (FLEXERIL) 10 MG tablet Take 10 mg by mouth at bedtime as needed for muscle spasms.   Yes [provider]  gabapentin (NEURONTIN) 100 MG capsule Take 100 mg by mouth daily as needed (for leg pain).  05/10/17  Yes [provider]  hydrALAZINE (APRESOLINE) 50 MG tablet Take 50 mg by mouth 3 (three) times daily.    Yes [provider]  JANUMET 50-500 MG tablet Take 1 tablet by mouth daily. 03/27/17  Yes [provider]  KLOR-CON M10 10 MEQ tablet Take 10 mEq by mouth 3 (three) times a week.  05/21/17  Yes [provider]  metoprolol succinate (TOPROL-XL) 25 MG 24 hr tablet Take 25  mg by mouth daily. 03/20/17  Yes [provider]  ondansetron (ZOFRAN ODT) 4 MG disintegrating tablet Take 1 tablet (4 mg total) by mouth every 8 (eight) hours as needed for nausea or vomiting. Patient taking differently: Take 4 mg by mouth every 8 (eight) hours as needed for nausea or vomiting. DISSOLVE IN THE MOUTH 02/27/17  Yes Duffy Bruce, MD  prednisoLONE acetate (PRED FORTE) 1 % ophthalmic suspension Place 1 drop into the left eye daily.  04/07/17  Yes [provider]  Pseudoephed-APAP-Guaifenesin (TYLENOL SINUS SEVERE CONGEST) 30-325-200 MG TABS Take 1 tablet by mouth every 6 (six) hours as needed (for congestion and sinus pain).   Yes [provider]  rosuvastatin (CRESTOR) 10 MG tablet Take 10 mg by mouth daily.   Yes [provider]   Allergies  Allergen Reactions  . Oxycodone Itching    FAMILY HISTORY:  family history is not on file. SOCIAL HISTORY:  reports that she has been smoking Cigarettes.  She has been smoking about 0.25 packs per day. She has never used smokeless tobacco. She reports that she does not drink alcohol or use drugs.  REVIEW OF SYSTEMS:   Constitutional: Negative for fever, chills, weight loss, malaise/fatigue and diaphoresis.  HENT: Negative for hearing loss, ear pain, nosebleeds, + congestion, sore throat, neck pain, tinnitus and ear discharge.   Eyes: Negative for blurred vision, double vision, photophobia, pain, discharge and redness.  Respiratory: + cough,- hemoptysis, clear  sputum production, no  shortness of breath, no wheezing and stridor. Cough better.    Cardiovascular: Negative for chest pain, palpitations, orthopnea, claudication, leg swelling and PND.  Gastrointestinal: Negative for heartburn, nausea, vomiting, abdominal pain, diarrhea, constipation, blood in stool and melena.  Genitourinary: Negative for dysuria, urgency, frequency, hematuria and flank pain.  Musculoskeletal: Negative for myalgias, back pain, joint pain and falls.  Skin: Negative for itching and rash.  Neurological: Negative for dizziness, tingling, tremors, sensory change, speech change, focal weakness, seizures, loss of consciousness, weakness and headaches.  Endo/Heme/Allergies: Negative for environmental allergies and polydipsia. Does not bruise/bleed easily.  SUBJECTIVE:   Feels better  VITAL SIGNS: Temp:  [98.3 F (36.8 C)-98.7 F (37.1 C)] 98.3 F (36.8 C) (08/14 0539) Pulse Rate:  [63-78] 63 (08/14 0539) Resp:  [18] 18 (08/14 0539) BP: (120-134)/(58-75) 133/60 (08/14 1059) SpO2:  [94 %-98 %] 96 % (08/14 0539) Weight:  [247 lb 9.2 oz (112.3 kg)-248 lb 1.6 oz (112.5 kg)] 247 lb 9.2 oz (112.3 kg) (08/14 0539)  PHYSICAL EXAMINATION: General appearance:  71 Year old  female, well nourished NAD conversant  Eyes: anicteric sclerae, moist conjunctivae; PERRL, EOMI bilaterally. Mouth:  membranes and no mucosal ulcerations; normal hard and soft palate Neck: Trachea midline; neck supple, no JVD Lungs/chest: diffuse rhonchi and exp wheeze,  with normal respiratory effort and no intercostal retractions CV: RRR, no MRGs  Abdomen: Soft, non-tender; no masses or HSM Extremities: No peripheral edema or extremity lymphadenopathy Skin: Normal temperature, turgor and texture; no rash, ulcers or subcutaneous nodules Psych: Appropriate affect, alert and oriented to person, place and time    Recent Labs Lab 07/05/17 2011 07/06/17 0334  NA 140 140  K 3.1* 3.2*  CL 105 103  CO2 27 30  BUN 11 14  CREATININE 1.18* 1.22*  GLUCOSE 149* 143*    Recent Labs Lab 07/05/17 2011 07/06/17 0334  HGB 13.0 12.8  HCT 38.9 38.6  WBC 9.8 9.9  PLT 349 354   Dg Chest 2 View  Result Date: 07/05/2017 CLINICAL DATA:  Cough, wheezing, back pain, COPD, smoker EXAM: CHEST  2 VIEW COMPARISON:  02/27/2017 FINDINGS: Normal heart size, mediastinal contours, and pulmonary vascularity. Atherosclerotic calcification aorta. Slight increase in size of a focal opacity at the RIGHT apex highly concerning for a pulmonary neoplasm 25 x 17 mm in size. Remaining lungs clear. No pleural effusion or pneumothorax. Prior cervical spine fusion. Bones otherwise unremarkable. IMPRESSION: Slight increase in size of an opacity at the RIGHT apex now 25 x 17 mm in size highly concerning for a pulmonary  neoplasm ; CT chest with contrast recommended for further evaluation. These results will be called to the ordering clinician or representative by the Radiologist Assistant, and communication documented in the PACS or zVision Dashboard. Electronically Signed   By: Lavonia Dana M.D.   On: 07/05/2017 16:06   Ct Chest W Contrast  Result Date: 07/05/2017 CLINICAL DATA:  Shortness of breath, lung nodule EXAM: CT CHEST WITH CONTRAST TECHNIQUE: Multidetector CT imaging of the chest was performed during intravenous contrast administration. CONTRAST:  75 cc Isovue 300 intravenous COMPARISON:  Radiograph 07/05/2017, 02/27/2017, 09/30/2016 FINDINGS: Cardiovascular: Non aneurysmal aorta. Aortic atherosclerotic calcifications. Coronary artery calcifications. Normal heart size. No significant pericardial effusion. Mediastinum/Nodes: Midline trachea. No thyroid. subcentimeter nonspecific mediastinal lymph nodes. Esophagus within normal limits. Lungs/Pleura: Mildly spiculated and partially cavitary lung mass in the apical portion of right upper lobe measuring 2.6 x 1.9 cm, corresponding to radiographic abnormality. No pleural effusion. No pneumothorax. Scattered lung blebs. Upper Abdomen: No acute abnormality. Surgical clips in the gallbladder fossa. Musculoskeletal: No acute or suspicious bone lesion IMPRESSION: 2.6 x 1.9 cm slightly spiculated right upper lobe pulmonary mass with slight central cavitation, given growth on prior radiographs, findings are suspicious for lung neoplasm. Correlation with PET-CT and/or tissue sampling is recommended. Aortic Atherosclerosis (ICD10-I70.0). Electronically Signed   By: Donavan Foil M.D.   On: 07/05/2017 22:56    ASSESSMENT / PLAN:  RUL cavitary lung mass.  -ddx: malignancy vs infectious. Given the fact that it has been present since April and demonstrates radiographic progression malignancy would be highest on her ddx. The best approach would be PET scan as if negative w/ the  exception of the lung mass then perhaps thoracic referral for lobectomy would be best option. If has additional areas of concern then referral to interventional radiology would be first option vs navigational bronchoscopy as option # 2. We have asked IR to review film We will also have the CT converted to super D cuts should IR feel that windows would yield poor option for navigational biopsy.  Plan -F/u 8/22 at 1045 [address] [facility]  At that point we will order a) PET scan and b) PFTs so that we can better risk stratify her should her should she be a surgical candidate.  -if PET scan positive but no other areas of concern then she [place] be a candidate for lobectomy. -if PET scan has additional areas of concern we will need to decide on CTFNA vs navigational bronchoscopy. We will have her f/u w/ Glorie Dowlen should navigational bronch be better option.  -Dr Doylene Canard please comment on cardiac clearance   Acute Bronchitis Plan Cont azith x 5d  Erick Colace ACNP-BC [place] Pager # [phone] OR # [phone] if no answer 07/06/2017, 2:18 PM   Attending Note:  I have examined patient, reviewed labs, studies and notes. I have discussed the case with Jerrye Bushy, and I agree with the data and plans as  amended above. 71 year old woman with a history of tobacco use, COPD, hypertension. She was admitted for cough and chest discomfort. Her evaluation has shown a right upper lobe nodule on CT scan of the chest. In retrospect this nodule was visible on chest x-ray back in April 2018. On current imaging it is solid, peripheral and abuts the pleura. Appearance is suggestive of a primary lung cancer. On my evaluation she is an obese woman, lying comfortably in bed. She interacts appropriately. Her lungs are distant but clear bilaterally. No wheezing or crackles. I discussed with her the options for both diagnosis and treatment. She will need biopsy. I believe the best initial plan would be staging evaluation  with PET scan, then referral for possible resection if she meets criteria. We will perform pulmonary function testing to assess suitability for possible surgery. If she is not a good candidate then we will consider alternative biopsy. I have discussed with interventional radiology (Dr. Pascal Lux), and the lesion would be a reasonable target for TTNA if primary resection is not a good option. She will follow up in our office with B [facility] to coordinate the testing and any necessary referrals.   Baltazar Apo, MD, PhD 07/06/2017, 4:02 PM [facility] [phone] or if no answer [phone]

## 2017-07-06 NOTE — Progress Notes (Signed)
Pt arrive to [place] [facility]. Ambulatory, family at bedside. Pt identified properly, alert and oriented x 4, VS collected, no signs of acute distress. Pt c/o back pain, Dr Doylene Canard notified and PRN meds ordered.  Pt oriented to room and equipment, placed on cardiac monitor. Call bell with in reach and pt instructed to call for assistance.  Will continue to monitor and treat pt per MD orders.

## 2017-07-07 DIAGNOSIS — J209 Acute bronchitis, unspecified: Secondary | ICD-10-CM | POA: Diagnosis not present

## 2017-07-07 LAB — BASIC METABOLIC PANEL
ANION GAP: 8 (ref 5–15)
BUN: 18 mg/dL (ref 6–20)
CALCIUM: 8.5 mg/dL — AB (ref 8.9–10.3)
CO2: 26 mmol/L (ref 22–32)
CREATININE: 1.13 mg/dL — AB (ref 0.44–1.00)
Chloride: 106 mmol/L (ref 101–111)
GFR, EST AFRICAN AMERICAN: 56 mL/min — AB (ref >60)
GFR, EST NON AFRICAN AMERICAN: 48 mL/min — AB (ref >60)
Glucose, Bld: 174 mg/dL — ABNORMAL HIGH (ref 65–99)
Potassium: 4 mmol/L (ref 3.5–5.1)
SODIUM: 140 mmol/L (ref 135–145)

## 2017-07-07 LAB — HEMOGLOBIN A1C
HEMOGLOBIN A1C: 6.5 % — AB (ref 4.8–5.6)
MEAN PLASMA GLUCOSE: 140 mg/dL

## 2017-07-07 MED ORDER — AZITHROMYCIN 250 MG PO TABS: 250.0000 mg | ORAL_TABLET | Freq: Every day | ORAL | 0 refills | Status: DC

## 2017-07-07 MED ORDER — KLOR-CON M10 10 MEQ PO TBCR: 10.0000 meq | EXTENDED_RELEASE_TABLET | Freq: Every day | ORAL | Status: DC

## 2017-07-07 NOTE — Discharge Summary (Signed)
Physician Discharge Summary  Patient ID: [name] MRN: [id number] DOB/AGE: 1946-09-08 71 y.o.  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 She has past medical history of diabetes mellitus type 2, hypertension, obesity, COPD, headaches, chronic neck and back pain. Her chest x-ray had showed a right apical focal obesity and she has a long-standing history of smoking. CT of the chest with contrast showed a 2.6 x 1.9 cm slightly spiculated right upper lobe pulmonary mass with a slight central cavitation suspicious for lung neoplasm. Aortic atherosclerosis. Her nuclear stress test was without any reversible ischemia with a normal left ventricular wall motion and ejection fraction of 59%. Pulmonary consult of Dr. Baltazar Apo was obtained. Patient will be followed by pulmonary clinic in the less than 2 weeks for cancer workup. She was given a nicotine patch for smoking cessation. She will follow with me in 2 weeks.  Consults: cardiology and pulmonary/intensive care  Significant Diagnostic Studies: labs: CBC was unremarkable. BMET showed a hypokalemia and hyperglycemia with the creatinine of 1.22. Subsequent BMET showed normal electrolytes and decreasing creatinine to 1.13.  Chest x-ray showed 2 increased obesity on the right appendix concerning for pulmonary neoplasm.  CT chest showed 0.6 x 1.9 cm slightly spiculated  right upper lobe pulmonary mass with a slight central cavitation suspicious for lung neoplasm.  Nuclear stress test showed normal wall motion with ejection fraction of 59% and no reversible ischemia.  Treatments: IV hydration and potassium. Pulmonary consult with OP work up.  Discharge Exam: Blood pressure 134/62, pulse 70, temperature 98.1 F (36.7 C), temperature source Oral, resp. rate 16, height 5\' 7"  (1.702 m), weight 101.2 kg (223 lb), SpO2 98 %. General appearance: alert, cooperative and appears stated age. Head: Normocephalic, atraumatic. Eyes: Brown eyes, pink conjunctiva, corneas clear. PERRL, EOM's intact.  Neck: No adenopathy, no carotid bruit, no JVD, supple, symmetrical, trachea midline and thyroid not enlarged. Resp: Clearing to auscultation with forced expiratory wheezing, bilaterally. Cardio: Regular rate and rhythm, S1, S2 normal, II/VI systolic murmur, no click, rub or gallop. GI: Soft, non-tender; bowel sounds normal; no organomegaly. Extremities: No edema, cyanosis or clubbing. Skin: Warm and dry.  Neurologic: Alert and oriented X 3, normal strength and tone. Normal coordination and gait.  Disposition: 01-Home or Self Care   Allergies as of 07/07/2017      Reactions   Oxycodone Itching      Medication List    STOP taking these medications   prednisoLONE acetate 1 % ophthalmic suspension Commonly known as:  PRED FORTE   TYLENOL SINUS SEVERE CONGEST 30-325-200 MG Tabs Generic drug:  Pseudoephed-APAP-Guaifenesin     TAKE these medications   VENTOLIN HFA 108 (90 Base) MCG/ACT inhaler Generic drug:  albuterol Inhale 2 puffs into the lungs every 6 (six) hours as needed for wheezing or shortness of breath.   albuterol (2.5 MG/3ML) 0.083% nebulizer solution Commonly known as:  PROVENTIL USE 1 VIAL  EVERY 6 HOURS AS NEEDED FOR SHORTNESS OF BREATH   ALPRAZolam 0.25 MG tablet Commonly known as:  XANAX Take 0.25 mg by mouth daily as needed for anxiety.    amLODipine 5 MG tablet Commonly known as:  NORVASC Take 5 mg by mouth 2 (two) times daily.   azithromycin 250 MG tablet Commonly known as:  ZITHROMAX Take 1 tablet (250 mg total) by mouth daily.   cloNIDine 0.1 MG tablet Commonly known as:  CATAPRES Take 0.1 mg by mouth 2 (two) times daily as needed (high blood pressure).   conjugated estrogens vaginal cream Commonly known as:  PREMARIN Use 1/2 applicator 3 times weekly.   cyclobenzaprine 10 MG tablet Commonly known as:  FLEXERIL Take 10 mg by mouth at bedtime as needed for muscle spasms.   gabapentin 100 MG capsule Commonly known as:  NEURONTIN Take 100 mg by mouth daily as needed (for leg pain).   hydrALAZINE 50 MG tablet Commonly known as:  APRESOLINE Take 50 mg by mouth 3 (three) times daily.   JANUMET 50-500 MG tablet Generic drug:  sitaGLIPtin-metformin Take 1 tablet by mouth daily.   KLOR-CON M10 10 MEQ tablet Generic drug:  potassium chloride Take 1 tablet (10 mEq total) by mouth daily. What changed:  when to take this   metoprolol succinate 25 MG 24 hr tablet Commonly known as:  TOPROL-XL Take 25 mg by mouth daily.   ondansetron 4 MG disintegrating tablet Commonly known as:  ZOFRAN ODT Take 1 tablet (4 mg total) by mouth every 8 (eight) hours as needed for nausea or vomiting. What changed:  additional instructions   rosuvastatin 10 MG tablet Commonly known as:  CRESTOR Take 10 mg by mouth daily.      Follow-up Information    Donita Brooks, NP Follow up on 07/14/2017.   Specialty:  Pulmonary Disease Why:  1045 am  Contact information: [address] [place] [address] [phone]        Dixie Dials, MD. Schedule an appointment as soon as possible for a visit in 2 week(s).   Specialty:  Cardiology Contact information: [place] [address] [phone]           Signed: Birdie Riddle 07/07/2017, 9:54 AM

## 2017-07-07 NOTE — Progress Notes (Signed)
NURSING PROGRESS NOTE  [name] [id number] Discharge Data: 07/07/2017 11:03 AM Attending Provider: Dixie Dials, MD [email or web address], Ajay, MD     [name] to be D/C'd Home per MD order.  Discussed with the patient the After Visit Summary and all questions fully answered. All IV's discontinued with no bleeding noted. All belongings returned to patient for patient to take home.   Last Vital Signs:  Blood pressure 134/62, pulse 70, temperature 98.1 F (36.7 C), temperature source Oral, resp. rate 16, height 5\' 7"  (1.702 m), weight 101.2 kg (223 lb), SpO2 98 %.  Discharge Medication List Allergies as of 07/07/2017      Reactions   Oxycodone Itching      Medication List    STOP taking these medications   prednisoLONE acetate 1 % ophthalmic suspension Commonly known as:  PRED FORTE   TYLENOL SINUS SEVERE CONGEST 30-325-200 MG Tabs Generic drug:  Pseudoephed-APAP-Guaifenesin     TAKE these medications   VENTOLIN HFA 108 (90 Base) MCG/ACT inhaler Generic drug:  albuterol Inhale 2 puffs into the lungs every 6 (six) hours as needed for wheezing or shortness of breath.   albuterol (2.5 MG/3ML) 0.083% nebulizer solution Commonly known as:  PROVENTIL USE 1 VIAL EVERY 6 HOURS AS NEEDED FOR SHORTNESS OF BREATH   ALPRAZolam 0.25 MG tablet Commonly known as:  XANAX Take 0.25 mg by mouth daily as needed for anxiety.   amLODipine 5 MG tablet Commonly known as:  NORVASC Take 5 mg by mouth 2 (two) times daily.   azithromycin 250 MG tablet Commonly known as:  ZITHROMAX Take 1 tablet (250 mg total) by mouth daily.   cloNIDine 0.1 MG tablet Commonly known as:  CATAPRES Take 0.1 mg by mouth 2 (two) times daily as needed (high blood pressure).   conjugated estrogens vaginal cream Commonly known as:  PREMARIN Use 1/2 applicator 3 times weekly.   cyclobenzaprine 10 MG tablet Commonly known as:  FLEXERIL Take 10 mg by mouth at bedtime as needed for muscle spasms.   gabapentin 100 MG  capsule Commonly known as:  NEURONTIN Take 100 mg by mouth daily as needed (for leg pain).   hydrALAZINE 50 MG tablet Commonly known as:  APRESOLINE Take 50 mg by mouth 3 (three) times daily.   JANUMET 50-500 MG tablet Generic drug:  sitaGLIPtin-metformin Take 1 tablet by mouth daily.   KLOR-CON M10 10 MEQ tablet Generic drug:  potassium chloride Take 1 tablet (10 mEq total) by mouth daily. What changed:  when to take this   metoprolol succinate 25 MG 24 hr tablet Commonly known as:  TOPROL-XL Take 25 mg by mouth daily.   ondansetron 4 MG disintegrating tablet Commonly known as:  ZOFRAN ODT Take 1 tablet (4 mg total) by mouth every 8 (eight) hours as needed for nausea or vomiting. What changed:  additional instructions   rosuvastatin 10 MG tablet Commonly known as:  CRESTOR Take 10 mg by mouth daily.        Doristine Devoid, RN

## 2017-07-14 ENCOUNTER — Encounter: Payer: Self-pay | Admitting: Pulmonary Disease

## 2017-07-14 ENCOUNTER — Ambulatory Visit (INDEPENDENT_AMBULATORY_CARE_PROVIDER_SITE_OTHER): Payer: Medicare HMO | Admitting: Pulmonary Disease

## 2017-07-14 VITALS — BP 132/68 | HR 80 | Ht 67.0 in | Wt 219.0 lb

## 2017-07-14 DIAGNOSIS — R918 Other nonspecific abnormal finding of lung field: Secondary | ICD-10-CM | POA: Diagnosis not present

## 2017-07-14 DIAGNOSIS — J440 Chronic obstructive pulmonary disease with acute lower respiratory infection: Secondary | ICD-10-CM

## 2017-07-14 DIAGNOSIS — J209 Acute bronchitis, unspecified: Secondary | ICD-10-CM | POA: Diagnosis not present

## 2017-07-14 MED ORDER — PREDNISONE 20 MG PO TABS: ORAL_TABLET | ORAL | 0 refills | Status: DC

## 2017-07-14 NOTE — Progress Notes (Signed)
[facility]   Chief Complaint  Patient presents with  . Hospitalization Follow-up    discharged 07/07/17- pt states breathing is baseline since discharge. pt reports of sob with exertion, prod cough with white mucus & wheezing.     Primary Pulmonologist: Dr. Lamonte Sakai  Current Outpatient Prescriptions on File Prior to Visit  Medication Sig  . albuterol (PROVENTIL) (2.5 MG/3ML) 0.083% nebulizer solution USE 1 VIAL EVERY 6 HOURS AS NEEDED FOR SHORTNESS OF BREATH  . albuterol (VENTOLIN HFA) 108 (90 Base) MCG/ACT inhaler Inhale 2 puffs into the lungs every 6 (six) hours as needed for wheezing or shortness of breath.  . ALPRAZolam (XANAX) 0.25 MG tablet Take 0.25 mg by mouth daily as needed for anxiety.   Marland Kitchen amLODipine (NORVASC) 5 MG tablet Take 5 mg by mouth 2 (two) times daily.   . cloNIDine (CATAPRES) 0.1 MG tablet Take 0.1 mg by mouth 2 (two) times daily as needed (high blood pressure).   . conjugated estrogens (PREMARIN) vaginal cream Use 1/2 applicator 3 times weekly.  . cyclobenzaprine (FLEXERIL) 10 MG tablet Take 10 mg by mouth at bedtime as needed for muscle spasms.  Marland Kitchen gabapentin (NEURONTIN) 100 MG capsule Take 100 mg by mouth daily as needed (for leg pain).   . hydrALAZINE (APRESOLINE) 50 MG tablet Take 50 mg by mouth 3 (three) times daily.   Marland Kitchen JANUMET 50-500 MG tablet Take 1 tablet by mouth daily.  Marland Kitchen KLOR-CON M10 10 MEQ tablet Take 1 tablet (10 mEq total) by mouth daily.  . metoprolol succinate (TOPROL-XL) 25 MG 24 hr tablet Take 25 mg by mouth daily.  . ondansetron (ZOFRAN ODT) 4 MG disintegrating tablet Take 1 tablet (4 mg total) by mouth every 8 (eight) hours as needed for nausea or vomiting. (Patient taking differently: Take 4 mg by mouth every 8 (eight) hours as needed for nausea or vomiting. DISSOLVE IN THE MOUTH)  . rosuvastatin (CRESTOR) 10 MG tablet Take 10 mg by mouth daily.  . [DISCONTINUED] budesonide-formoterol (SYMBICORT) 160-4.5 MCG/ACT inhaler Inhale 2 puffs into the  lungs 2 (two) times daily. (Patient taking differently: Inhale 2 puffs into the lungs 2 (two) times daily as needed (wheezing). )   No current facility-administered medications on file prior to visit.      Studies: CT Chest w Contrast 8/13 >> 2.6 x 1.9 cm slightly spiculated RUL pulmonary mass with slight central cavitation, concerning for underlying neoplasm PET CT 06/2017 >>   Past Medical Hx:  has a past medical history of Anxiety; Arthritis; Chronic back pain greater than 3 months duration; Chronic neck pain; COPD (chronic obstructive pulmonary disease) ([place]); Diabetes mellitus; Dyspnea; GERD (gastroesophageal reflux disease); H/O hiatal hernia; Headache(784.0); Hemorrhoids; Herniated disc; History of kidney stones; echocardiogram (2010); Hypertension; Palpitations; Shortness of breath on exertion (11/10/11); and Vertigo.   Past Surgical hx, Allergies, Family hx, Social hx all reviewed.  Vital Signs BP 132/68 (BP Location: Left Arm, Cuff Size: Normal)   Pulse 80   Ht 5\' 7"  (1.702 m)   Wt 219 lb (99.3 kg)   SpO2 96%   BMI 34.30 kg/m   History of Present Illness [name] is a 71 y.o. female, current smoker (1/2 ppd currently) with a history of COPD who presented to the pulmonary office for hospital follow-up.    Patient was recently admitted 8/13-8/15  with a three-day history of cough with clear sputum production, watery eyes, nasal congestion, sore throat. Prior to admission she had a known history of a right  apical lung mass which was noted in April 2018. She was admitted for evaluation of cough and right upper lobe apical lung mass. Patient underwent a CT scan on admission which demonstrated a 2.6 x 1.9 cm slightly spiculated RUL pulmonary mass with slight central cavitation, concerning for underlying neoplasm.  She was treated for acute bronchitis during hospitalization with azithromycin. She reports she feels about the same with no significant improvement in cough. She has  attempted to cut down on her smoking and is currently smoking 3 cigarettes per day.  She also carries a history of chronic back pain and is asking for hydrocodone.  Physical Exam  General - well developed adult F in no acute distress ENT - No sinus tenderness, no oral exudate, no LAN Cardiac - s1s2 regular, no murmur Chest - even/non-labored, lungs bilaterally coarse. No wheeze/rales Back - No focal tenderness Abd - Soft, non-tender Ext - No edema Neuro - Normal strength Skin - No rashes Psych - normal mood, and behavior   Assessment/Plan  Discussion: 71 year old female, current smoker with history of right upper lobe spiculated lung mass who presented for follow-up of recent acute bronchitis and lung mass.   RUL Spiculated Lung Mass   Plan: Arrange PET CT to review for hypermetabolic activity of right upper lobe mass If PET scan is positive with no other areas of concern she Janille be a candidate for lobectomy PET scan has additional areas of concern, will review for CT FNA versus navigational bronchoscopy   Acute bronchitis   Plan: Prednisone 40 mg daily 4 days then stop Patient instructed to use over-the-counter Delsym for cough   Tobacco Abuse  Plan: Smoking cessation counseling  Patient Instructions  1.  Prednisone 40mg  daily (take in the am) for your cough / bronchitis for 4 days, then stop 2.  Use over the counter Delsym for cough as instructed on the bottle (Ok to buy the store brand) 3.  We will arrange a PET CT scan to review the area in your right lung.  We will call you to discuss the results.  4.  Continue to work on quitting smoking 5.  Call if new or worsening symptoms 6.  Follow up with Dr. Lamonte Sakai in 3 months    Noe Gens, NP-C [facility]  [phone] 07/14/2017, 2:38 PM

## 2017-07-14 NOTE — Patient Instructions (Addendum)
1.  Prednisone 40mg  daily (take in the am) for your cough / bronchitis for 4 days, then stop 2.  Use over the counter Delsym for cough as instructed on the bottle (Ok to buy the store brand) 3.  We will arrange a PET CT scan to review the area in your right lung.  We will call you to discuss the results.  4.  Continue to work on quitting smoking 5.  Call if new or worsening symptoms 6.  Follow up with Dr. Lamonte Sakai in 3 months

## 2017-07-28 ENCOUNTER — Ambulatory Visit (HOSPITAL_COMMUNITY)
Admission: RE | Admit: 2017-07-28 | Discharge: 2017-07-28 | Disposition: A | Discharge status: Home / Self Care | Payer: Medicare HMO | Source: Ambulatory Visit | Attending: Pulmonary Disease | Admitting: Pulmonary Disease

## 2017-07-28 DIAGNOSIS — I700 Atherosclerosis of aorta: Secondary | ICD-10-CM | POA: Diagnosis not present

## 2017-07-28 DIAGNOSIS — R918 Other nonspecific abnormal finding of lung field: Secondary | ICD-10-CM | POA: Diagnosis present

## 2017-07-28 DIAGNOSIS — I77811 Abdominal aortic ectasia: Secondary | ICD-10-CM | POA: Diagnosis not present

## 2017-07-28 LAB — GLUCOSE, CAPILLARY: Glucose-Capillary: 140 mg/dL — ABNORMAL HIGH (ref 65–99)

## 2017-07-28 MED ORDER — FLUDEOXYGLUCOSE F - 18 (FDG) INJECTION
10.9500 | Freq: Once | INTRAVENOUS | Status: AC | PRN
  Administered 2017-07-28: 10.95 via INTRAVENOUS

## 2017-07-29 ENCOUNTER — Telehealth: Payer: Self-pay | Admitting: Pulmonary Disease

## 2017-07-29 DIAGNOSIS — R918 Other nonspecific abnormal finding of lung field: Secondary | ICD-10-CM

## 2017-07-29 NOTE — Telephone Encounter (Signed)
Yes mam I need a referral to [facility] thanks

## 2017-07-29 NOTE — Telephone Encounter (Signed)
Called [name] to review her PET CT findings as below -   1. Intense FDG uptake associated with the cavitary nodule in the right upper lobe which is suspicious for primary bronchogenic carcinoma. Given the presence of internal cavitation this [name] represent a squamous cell carcinoma. Correlation with tissue sampling advise. 2. No evidence for hypermetabolic adenopathy or distant metastatic disease. 3. Small, 8 mm cervical lymph node deep to the left parotid gland exhibits mild FDG uptake (SUV max equals 2.77). Nonspecific. 4. Aortic Atherosclerosis (ICD10-I70.0). 5. Infrarenal abdominal aortic ectasia. Ectatic abdominal aorta at risk for aneurysm development. Recommend followup by ultrasound in 5 years. This recommendation follows ACR consensus guidelines: White Paper of the ACR Incidental Findings Committee II on Vascular Findings. J Am Coll Radiol 2013; 10:789-794.    Reviewed that the above findings are likely cancer with the patient and that she needs surgical referral.  Discussed that there is no evidence of distant metastases with the patient.   Plan: Referral to Dr. Roxan Hockey for possible resection   Noe Gens, NP-C [facility] Pgr: [phone] or if no answer [phone] 07/29/2017, 4:12 PM

## 2017-07-30 NOTE — Addendum Note (Signed)
Addended by: Donita Brooks on: 07/30/2017 08:09 AM   Modules accepted: Orders

## 2017-08-03 ENCOUNTER — Telehealth: Payer: Self-pay | Admitting: Pulmonary Disease

## 2017-08-03 NOTE — Telephone Encounter (Signed)
Referral for thoracic surgeon was ordered. PCC's can we check on the appointment status?

## 2017-08-03 NOTE — Telephone Encounter (Signed)
I called [facility] & verified they do have our referral & Micki Riley is working on it.  They will be calling the pt.  I called pt & told her surgeon's office does have our referral to them & they will be calling her.  She stated ok.  Nothing further needed.

## 2017-08-11 ENCOUNTER — Other Ambulatory Visit: Payer: Self-pay | Admitting: Thoracic Surgery (Cardiothoracic Vascular Surgery)

## 2017-08-11 ENCOUNTER — Encounter: Payer: Self-pay | Admitting: Thoracic Surgery (Cardiothoracic Vascular Surgery)

## 2017-08-11 ENCOUNTER — Institutional Professional Consult (permissible substitution) (INDEPENDENT_AMBULATORY_CARE_PROVIDER_SITE_OTHER): Payer: Medicare HMO | Admitting: Thoracic Surgery (Cardiothoracic Vascular Surgery)

## 2017-08-11 VITALS — BP 163/88 | HR 87 | Resp 16 | Ht 67.0 in | Wt 218.0 lb

## 2017-08-11 DIAGNOSIS — J441 Chronic obstructive pulmonary disease with (acute) exacerbation: Secondary | ICD-10-CM

## 2017-08-11 DIAGNOSIS — J984 Other disorders of lung: Secondary | ICD-10-CM | POA: Diagnosis not present

## 2017-08-11 NOTE — Progress Notes (Signed)
PCP is Dixie Dials, MD Referring Provider is Donita Brooks, NP  Chief Complaint  Patient presents with  . Lung Mass    right upper lobe...CT CHEST/PET    HPI:  [name] is a 71 year old woman sent for consultation regarding a right upper lobe lung nodule.  [name] is a  71 year old woman with a past medical history significant for tobacco abuse (30 pack years, quit 1 month ago),  COPD, hypertension, hyperlipidemia, type 2 diabetes, arthritis, chronic neck and back pain, anxiety, vertigo, hiatal hernia and reflux. She was admitted to the hospital last month with cough, shortness of breath and wheezing. Ruled out for MI. Nuclear stress- normal EF, low risk. CT showed a cavitary RUL nodule. No mediastinal or hilar adenopathy. Treated with azithromycin. Symptoms improved but still has cough and wheezing.  No chest pain, pressure or tightness with exertion. No significant weight loss. Anxious. + cough, no hemoptysis, + wheezing. Headaches- chronic. Smoked 1/2 ppd x 55 years before quitting last month.  Zubrod Score: At the time of surgery this patient's most appropriate activity status/level should be described as: []     0    Normal activity, no symptoms [x]     1    Restricted in physical strenuous activity but ambulatory, able to do out light work []     2    Ambulatory and capable of self care, unable to do work activities, up and about >50 % of waking hours                              []     3    Only limited self care, in bed greater than 50% of waking hours []     4    Completely disabled, no self care, confined to bed or chair []     5    Moribund    Past Medical History:  Diagnosis Date  . Anxiety   . Arthritis   . Chronic back pain greater than 3 months duration   . Chronic neck pain   . COPD (chronic obstructive pulmonary disease) ([place])   . Diabetes mellitus   . Dyspnea   . GERD (gastroesophageal reflux disease)   . H/O hiatal hernia   . Headache(784.0)   . Hemorrhoids    . Herniated disc   . History of kidney stones   . Hx of echocardiogram 2010   normal EF  . Hypertension   . Palpitations   . Shortness of breath on exertion 11/10/11   "sometimes"  . Vertigo     Past Surgical History:  Procedure Laterality Date  . ABDOMINAL HYSTERECTOMY  1997?   "partial"  . ANTERIOR CERVICAL DECOMP/DISCECTOMY FUSION N/A 12/25/2016   Procedure: ANTERIOR CERVICAL DECOMPRESSION/DISCECTOMY FUSION CERVICAL FOUR CERVICAL FIVE;  Surgeon: Ashok Pall, MD;  Location: [facility];  Service: Neurosurgery;  Laterality: N/A;  . CHOLECYSTECTOMY  ~ 2008  . COLONOSCOPY    . FRACTURE SURGERY  2004?   right shoulder  . KNEE ARTHROSCOPY  2004   right  . KNEE SURGERY     right  . neck fusion    . SHOULDER OPEN ROTATOR CUFF REPAIR  07/20/2011   left  . TONSILLECTOMY      No family history on file.  Social History Social History  Substance Use Topics  . Smoking status: Current Some Day Smoker    Packs/day: 0.25    Types: Cigarettes  . Smokeless tobacco:  Never Used     Comment: 4-5 cigs / day  . Alcohol use No    Current Outpatient Prescriptions  Medication Sig Dispense Refill  . albuterol (PROVENTIL) (2.5 MG/3ML) 0.083% nebulizer solution USE 1 VIAL EVERY 6 HOURS AS NEEDED FOR SHORTNESS OF BREATH  0  . albuterol (VENTOLIN HFA) 108 (90 Base) MCG/ACT inhaler Inhale 2 puffs into the lungs every 6 (six) hours as needed for wheezing or shortness of breath.    . ALPRAZolam (XANAX) 0.25 MG tablet Take 0.25 mg by mouth daily as needed for anxiety.   2  . amLODipine (NORVASC) 5 MG tablet Take 5 mg by mouth 2 (two) times daily.     . cloNIDine (CATAPRES) 0.1 MG tablet Take 0.1 mg by mouth 2 (two) times daily as needed (high blood pressure).     . conjugated estrogens (PREMARIN) vaginal cream Use 1/2 applicator 3 times weekly. 42.5 g 12  . cyclobenzaprine (FLEXERIL) 10 MG tablet Take 10 mg by mouth at bedtime as needed for muscle spasms.    Marland Kitchen gabapentin (NEURONTIN) 100 MG capsule Take  100 mg by mouth daily as needed (for leg pain).   1  . hydrALAZINE (APRESOLINE) 50 MG tablet Take 50 mg by mouth 3 (three) times daily.     Marland Kitchen JANUMET 50-500 MG tablet Take 1 tablet by mouth daily.  6  . KLOR-CON M10 10 MEQ tablet Take 1 tablet (10 mEq total) by mouth daily.    . metoprolol succinate (TOPROL-XL) 25 MG 24 hr tablet Take 25 mg by mouth daily.  1  . ondansetron (ZOFRAN ODT) 4 MG disintegrating tablet Take 1 tablet (4 mg total) by mouth every 8 (eight) hours as needed for nausea or vomiting. (Patient taking differently: Take 4 mg by mouth every 8 (eight) hours as needed for nausea or vomiting. DISSOLVE IN THE MOUTH) 20 tablet 0  . rosuvastatin (CRESTOR) 10 MG tablet Take 10 mg by mouth daily.     No current facility-administered medications for this visit.     Allergies  Allergen Reactions  . Oxycodone Itching    Review of Systems  Constitutional: Negative for fatigue, fever and unexpected weight change.  HENT: Negative for trouble swallowing and voice change.   Eyes: Positive for visual disturbance (blurred vision since catarract surgery).  Respiratory: Positive for cough, shortness of breath and wheezing.   Cardiovascular: Positive for palpitations. Negative for chest pain and leg swelling.  Gastrointestinal: Positive for abdominal pain (reflux). Negative for nausea and rectal pain.  Genitourinary: Negative for difficulty urinating and dysuria.  Musculoskeletal: Positive for arthralgias, back pain and neck pain.  Neurological: Positive for headaches. Negative for syncope and weakness.  Hematological: Does not bruise/bleed easily.  Psychiatric/Behavioral: The patient is nervous/anxious.   All other systems reviewed and are negative.   BP (!) 163/88 (BP Location: Left Arm, Patient Position: Sitting, Cuff Size: Large)   Pulse 87   Resp 16   Ht 5\' 7"  (1.702 m)   Wt 218 lb (98.9 kg)   SpO2 97% Comment: ON RA  BMI 34.14 kg/m  Physical Exam  Constitutional: She is  oriented to person, place, and time. No distress.  Morbidly obese  HENT:  Head: Normocephalic and atraumatic.  Mouth/Throat: No oropharyngeal exudate.  Eyes: Conjunctivae and EOM are normal. No scleral icterus.  Neck: Neck supple. No thyromegaly present.  Cardiovascular: Normal rate, regular rhythm and normal heart sounds.  Exam reveals no gallop and no friction rub.   No murmur  heard. Pulmonary/Chest: Effort normal. No respiratory distress. She has wheezes. She has no rales.  Abdominal: Soft. She exhibits no distension. There is no tenderness.  Musculoskeletal: Normal range of motion. She exhibits no edema.  Lymphadenopathy:    She has no cervical adenopathy.  Neurological: She is alert and oriented to person, place, and time. No cranial nerve deficit. She exhibits normal muscle tone.  Skin: Skin is warm and dry.  Psychiatric:  Flat affect  Vitals reviewed.    Diagnostic Tests: CT CHEST WITH CONTRAST  TECHNIQUE: Multidetector CT imaging of the chest was performed during intravenous contrast administration.  CONTRAST:  75 cc Isovue 300 intravenous  COMPARISON:  Radiograph 07/05/2017, 02/27/2017, 09/30/2016  FINDINGS: Cardiovascular: Non aneurysmal aorta. Aortic atherosclerotic calcifications. Coronary artery calcifications. Normal heart size. No significant pericardial effusion.  Mediastinum/Nodes: Midline trachea. No thyroid. subcentimeter nonspecific mediastinal lymph nodes. Esophagus within normal limits.  Lungs/Pleura: Mildly spiculated and partially cavitary lung mass in the apical portion of right upper lobe measuring 2.6 x 1.9 cm, corresponding to radiographic abnormality. No pleural effusion. No pneumothorax. Scattered lung blebs.  Upper Abdomen: No acute abnormality. Surgical clips in the gallbladder fossa.  Musculoskeletal: No acute or suspicious bone lesion  IMPRESSION: 2.6 x 1.9 cm slightly spiculated right upper lobe pulmonary mass with  slight central cavitation, given growth on prior radiographs, findings are suspicious for lung neoplasm. Correlation with PET-CT and/or tissue sampling is recommended.  Aortic Atherosclerosis (ICD10-I70.0).   Electronically Signed   By: Donavan Foil M.D.   On: 07/05/2017 22:56 NUCLEAR MEDICINE PET SKULL BASE TO THIGH  TECHNIQUE: 10.95 mCi F-18 FDG was injected intravenously. Full-ring PET imaging was performed from the skull base to thigh after the radiotracer. CT data was obtained and used for attenuation correction and anatomic localization.  FASTING BLOOD GLUCOSE:  Value: 140 mg/dl  COMPARISON:  None.  FINDINGS: NECK: 8 mm lymph node deep to the left parotid gland is identified with an SUV max equal to 2.77.  CHEST: No hypermetabolic axillary or supraclavicular lymph nodes.  Normal heart size. Aortic atherosclerosis. Calcifications in the LAD and RCA and left circumflex coronary artery is noted. Small right paratracheal lymph node measures 6 mm and has an SUV max equal to 3.3.  No pleural effusion. Cavitary nodule in the right upper lobe measures 2.5 cm and has an SU the max equal to 15.0. Mild changes of centrilobular emphysema identified. No additional hypermetabolic pulmonary nodules.  ABDOMEN/PELVIS: No abnormal hypermetabolic activity within the liver, pancreas, adrenal glands, or spleen. Aortic atherosclerosis identified. The infrarenal abdominal aorta measures 2.6 cm, image 132 of series 4. No hypermetabolic lymph nodes in the abdomen or pelvis.  SKELETON: No focal hypermetabolic activity to suggest skeletal metastasis.  IMPRESSION: 1. Intense FDG uptake associated with the cavitary nodule in the right upper lobe which is suspicious for primary bronchogenic carcinoma. Given the presence of internal cavitation this Ray[place] represent a squamous cell carcinoma. Correlation with tissue sampling advise. 2. No evidence for hypermetabolic adenopathy  or distant metastatic disease. 3. Small, 8 mm cervical lymph node deep to the left parotid gland exhibits mild FDG uptake (SUV max equals 2.77). Nonspecific. 4.  Aortic Atherosclerosis (ICD10-I70.0). 5. Infrarenal abdominal aortic ectasia. Ectatic abdominal aorta at risk for aneurysm development. Recommend followup by ultrasound in 5 years. This recommendation follows ACR consensus guidelines: White Paper of the ACR Incidental Findings Committee II on Vascular Findings. J Am Coll Radiol 2013; 10:789-794.   Electronically Signed   By: Queen Slough.D.  On: 07/28/2017 12:30  I personally reviewd the CT and PET/CT and concur with the findings noted above.  Impression:  [name] is a 71 year old woman with a past medica history significant for morbid obesity, tob abuse, COPD, hypertension, hyperlipidemia, anxiety, arthritis and chronic pain. She was recently hospitalized with a respiratory infection. While in hospital she ruled out for MI and a nuclear stress test was low risk for ischemia. EF was 59%. A CT of the chest showed a 2 cm cavitary right upper lobe mass concerning for a primary bronchogenic carcinoma. A PET CT as an outpatient showed the nodule was hypermetabolic with an SUV of 15. There was a small right paratracheal node that was mildly hypermetabolic but it is not impressive on Ct or PET.  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 Infectious causes are also in the differential.  My preferred approach would be to proceed to Right VATS for wedge resection to be followed by a right upper lobectomy if the frozen section is positive. She needs PFTs to assess her ability to tolerate a major pulmonary resection.  She is still wheezing on exam despite being treated with antibiotics and using her nebulizer. That needs to be addressed before we can consider surgery.  I have  discussed the general nature of the procedure, including the need for general anesthesia, the incisions to be used, and the need for a postop drainage tube. We discussed the expected hospital stay, overall recovery and short and long term outcomes. I informed her of the indications, risks, benefits and alternatives. She understands the risks include, but are not limited to death, stroke, MI, DVT/PE, bleeding, possible need for transfusion, infections, cardiac arrhythmias, prolonged air leaks, as well as other organ system dysfunction including respiratory, renal, or GI complications.   Plan: Pulmonary function testing with and without bronchodilators  Follow-up with Dr. Lamonte Sakai to treat wheezing  Return in 2 weeks to discuss possible surgical resection  Melrose Nakayama, MD [facility] [phone]

## 2017-08-16 ENCOUNTER — Ambulatory Visit (INDEPENDENT_AMBULATORY_CARE_PROVIDER_SITE_OTHER): Payer: Medicare HMO | Admitting: Emergency Medicine

## 2017-08-16 ENCOUNTER — Encounter: Payer: Self-pay | Admitting: Emergency Medicine

## 2017-08-16 VITALS — BP 132/76 | HR 77 | Ht 67.0 in | Wt 220.0 lb

## 2017-08-16 DIAGNOSIS — R918 Other nonspecific abnormal finding of lung field: Secondary | ICD-10-CM | POA: Insufficient documentation

## 2017-08-16 DIAGNOSIS — K219 Gastro-esophageal reflux disease without esophagitis: Secondary | ICD-10-CM | POA: Diagnosis not present

## 2017-08-16 DIAGNOSIS — R0602 Shortness of breath: Secondary | ICD-10-CM

## 2017-08-16 MED ORDER — ESOMEPRAZOLE MAGNESIUM 40 MG PO PACK: 40.0000 mg | PACK | Freq: Every day | ORAL | 2 refills | Status: DC

## 2017-08-16 MED ORDER — TIOTROPIUM BROMIDE MONOHYDRATE 18 MCG IN CAPS: 18.0000 ug | ORAL_CAPSULE | Freq: Every day | RESPIRATORY_TRACT | 2 refills | Status: DC

## 2017-08-16 NOTE — Assessment & Plan Note (Signed)
Cavitary right upper lobe mass. No evidence of distant disease. Her spiro  and functional capacity suggest that she is a surgical candidate. I agree with Dr. Leonarda Salon plans. We will initiate treatment for presumed COPD. She should be able to go ahead and schedule surgery. Full PFT are pending.

## 2017-08-16 NOTE — Assessment & Plan Note (Signed)
Restart Nexium 40 mg daily. Suspect that GERD is responsible for her nocturnal cough, nocturnal wheezing

## 2017-08-16 NOTE — Assessment & Plan Note (Signed)
I suspect she does have COPD based on tobacco history, symptoms. Her spirometry today is reassuring, only mild obstruction present (if any). She still needs to have a full pulmonary function testing performed. Her wheezing is not entirely consistent with COPD as she has an inspiratory component. Question whether this Maisie be due to secretions? Based on her improvement on albuterol and believe it reasonable to start her on a daily scheduled bronchodilator. Spiriva once daily. I also believe it will be reasonable to start treatment of her GERD given her nocturnal symptoms.

## 2017-08-16 NOTE — Patient Instructions (Signed)
Please restart Nexium 40 mg once a day. Take this medication either 1 hour before or 1 hour after eating Please start Spiriva 1 inhalation once a day. We will teach you how to use this medication today in the office Congratulations on stopping smoking. Please do not restart Continue to have your albuterol available to use 2 puffs as needed for shortness of breath or wheezing. Your spirometry today is reassuring. You still need to go have your full breathing tests done as planned. There is nothing to preclude you from scheduling your lung surgery with Dr. Roxan Hockey. Follow with Dr Lamonte Sakai in 2 months or sooner if you have any problems.

## 2017-08-16 NOTE — Progress Notes (Signed)
Subjective:    Patient ID: [name], female    DOB: [birth year], 71 y.o.   MRN: [id number]  HPI 71 year old smoker (20 pack years, recently quit) with a history of presumed COPD. History diabetes, GERD, hiatal hernia. She was recently admitted with acute bronchitis. Imaging included a CT scan of the chest from 8/13 that showed a cavitary right upper lobe nodule. PET scan from 07/28/17 shows that the nodule is hypermetabolic. There is no mediastinal LAD. She has seen Dr [name] and is being considered for primary resection. She has not yet had pulmonary function testing. She is not on scheduled bronchodilators at this time. She does have albuterol that she uses approximately 2x a day. She tells me that she has wheeze, often at night when she lays down. She is able to exert. She coughs at night. Denies nasal congestion. She does have GERD sx. She used to be on nexium, not recently.   Spirometry today here >> normal, borderline MILD obstruction.   Review of Systems  Past Medical History:  Diagnosis Date  . Anxiety   . Arthritis   . Chronic back pain greater than 3 months duration   . Chronic neck pain   . COPD (chronic obstructive pulmonary disease) ([place])   . Diabetes mellitus   . Dyspnea   . GERD (gastroesophageal reflux disease)   . H/O hiatal hernia   . Headache(784.0)   . Hemorrhoids   . Herniated disc   . History of kidney stones   . Hx of echocardiogram 2010   normal EF  . Hypertension   . Palpitations   . Shortness of breath on exertion 11/10/11   "sometimes"  . Vertigo      No family history on file.   Social History   Social History  . Marital status: Married    Spouse name: N/A  . Number of children: N/A  . Years of education: N/A   Occupational History  . Not on file.   Social History Main Topics  . Smoking status: Former Smoker    Packs/day: 0.25    Years: 30.00    Types: Cigarettes    Quit date: 07/08/2017  . Smokeless tobacco: Never Used   Comment: 4-5 cigs / day  . Alcohol use No  . Drug use: No  . Sexual activity: No   Other Topics Concern  . Not on file   Social History Narrative  . No narrative on file     Allergies  Allergen Reactions  . Oxycodone Itching     Outpatient Medications Prior to Visit  Medication Sig Dispense Refill  . albuterol (PROVENTIL) (2.5 MG/3ML) 0.083% nebulizer solution USE 1 VIAL EVERY 6 HOURS AS NEEDED FOR SHORTNESS OF BREATH  0  . albuterol (VENTOLIN HFA) 108 (90 Base) MCG/ACT inhaler Inhale 2 puffs into the lungs every 6 (six) hours as needed for wheezing or shortness of breath.    . ALPRAZolam (XANAX) 0.25 MG tablet Take 0.25 mg by mouth daily as needed for anxiety.   2  . amLODipine (NORVASC) 5 MG tablet Take 5 mg by mouth 2 (two) times daily.     . cloNIDine (CATAPRES) 0.1 MG tablet Take 0.1 mg by mouth 2 (two) times daily as needed (high blood pressure).     . conjugated estrogens (PREMARIN) vaginal cream Use 1/2 applicator 3 times weekly. 42.5 g 12  . cyclobenzaprine (FLEXERIL) 10 MG tablet Take 10 mg by mouth at bedtime as needed for muscle spasms.    Marland Kitchen  gabapentin (NEURONTIN) 100 MG capsule Take 100 mg by mouth daily as needed (for leg pain).   1  . hydrALAZINE (APRESOLINE) 50 MG tablet Take 50 mg by mouth 3 (three) times daily.     Marland Kitchen JANUMET 50-500 MG tablet Take 1 tablet by mouth daily.  6  . KLOR-CON M10 10 MEQ tablet Take 1 tablet (10 mEq total) by mouth daily.    . metoprolol succinate (TOPROL-XL) 25 MG 24 hr tablet Take 25 mg by mouth daily.  1  . ondansetron (ZOFRAN ODT) 4 MG disintegrating tablet Take 1 tablet (4 mg total) by mouth every 8 (eight) hours as needed for nausea or vomiting. (Patient taking differently: Take 4 mg by mouth every 8 (eight) hours as needed for nausea or vomiting. DISSOLVE IN THE MOUTH) 20 tablet 0  . rosuvastatin (CRESTOR) 10 MG tablet Take 10 mg by mouth daily.     No facility-administered medications prior to visit.          Objective:    Physical Exam Vitals:   08/16/17 1407  BP: 132/76  Pulse: 77  SpO2: 99%  Weight: 220 lb (99.8 kg)  Height: 5\' 7"  (1.702 m)   Gen: Pleasant, elderly overwt in no distress, quiet affect  ENT: No lesions,  mouth clear,  oropharynx clear, no postnasal drip  Neck: No JVD, no stridor  Lungs: No use of accessory muscles, She has bilateral inspiratory wheeze, some coarse expiratory rhonchi  Cardiovascular: RRR, heart sounds normal, no murmur or gallops, no peripheral edema  Musculoskeletal: No deformities, no cyanosis or clubbing  Neuro: alert, non focal  Skin: Warm, no lesions or rashes      Assessment & Plan:  COPD (chronic obstructive pulmonary disease) ([place]) I suspect she does have COPD based on tobacco history, symptoms. Her spirometry today is reassuring, only mild obstruction present (if any). She still needs to have a full pulmonary function testing performed. Her wheezing is not entirely consistent with COPD as she has an inspiratory component. Question whether this [name] be due to secretions? Based on her improvement on albuterol and believe it reasonable to start her on a daily scheduled bronchodilator. Spiriva once daily. I also believe it will be reasonable to start treatment of her GERD given her nocturnal symptoms.  GERD (gastroesophageal reflux disease) Restart Nexium 40 mg daily. Suspect that GERD is responsible for her nocturnal cough, nocturnal wheezing  Lung mass Cavitary right upper lobe mass. No evidence of distant disease. Her spiro  and functional capacity suggest that she is a surgical candidate. I agree with Dr. Leonarda White plans. We will initiate treatment for presumed COPD. She should be able to go ahead and schedule surgery. Full PFT are pending.   Baltazar Apo, MD, PhD 08/16/2017, 2:52 PM [facility] [phone] or if no answer [phone]

## 2017-08-18 ENCOUNTER — Telehealth: Payer: Self-pay | Admitting: Emergency Medicine

## 2017-08-18 MED ORDER — UMECLIDINIUM BROMIDE 62.5 MCG/INH IN AEPB: 1.0000 | INHALATION_SPRAY | Freq: Every day | RESPIRATORY_TRACT | 5 refills | Status: DC

## 2017-08-18 MED ORDER — ESOMEPRAZOLE MAGNESIUM 40 MG PO CPDR: 40.0000 mg | DELAYED_RELEASE_CAPSULE | Freq: Every day | ORAL | 2 refills | Status: DC

## 2017-08-18 NOTE — Telephone Encounter (Signed)
Spoke with pt, who states Spiriva and Nexium are not preferred medications. I have spoken to [place] with [facility] , who states insurance will cover generic Nexium and covered alternative for Posey Rea is Incruse. Generic Nexium has been sent to preferred pharmacy.   RB please advise on alternative. Thanks.

## 2017-08-18 NOTE — Telephone Encounter (Signed)
OK with me to substitute Incruse for the Spiriva

## 2017-08-20 ENCOUNTER — Ambulatory Visit (HOSPITAL_COMMUNITY)
Admission: RE | Admit: 2017-08-20 | Discharge: 2017-08-20 | Disposition: A | Discharge status: Home / Self Care | Payer: Medicare HMO | Source: Ambulatory Visit | Attending: Thoracic Surgery (Cardiothoracic Vascular Surgery) | Admitting: Thoracic Surgery (Cardiothoracic Vascular Surgery)

## 2017-08-20 DIAGNOSIS — J441 Chronic obstructive pulmonary disease with (acute) exacerbation: Secondary | ICD-10-CM | POA: Diagnosis present

## 2017-08-20 LAB — PULMONARY FUNCTION TEST
DL/VA % pred: 90 %
DL/VA: 4.66 ml/min/mmHg/L
DLCO UNC: 20.94 ml/min/mmHg
DLCO unc % pred: 73 %
FEF 25-75 Post: 2.01 L/sec
FEF 25-75 Pre: 1.3 L/sec
FEF2575-%Change-Post: 55 %
FEF2575-%Pred-Post: 105 %
FEF2575-%Pred-Pre: 68 %
FEV1-%CHANGE-POST: 12 %
FEV1-%PRED-PRE: 82 %
FEV1-%Pred-Post: 92 %
FEV1-POST: 1.94 L
FEV1-PRE: 1.74 L
FEV1FVC-%Change-Post: 1 %
FEV1FVC-%Pred-Pre: 94 %
FEV6-%Change-Post: 10 %
FEV6-%PRED-POST: 100 %
FEV6-%Pred-Pre: 91 %
FEV6-POST: 2.62 L
FEV6-PRE: 2.38 L
FEV6FVC-%Change-Post: 0 %
FEV6FVC-%PRED-PRE: 104 %
FEV6FVC-%Pred-Post: 103 %
FVC-%CHANGE-POST: 10 %
FVC-%PRED-POST: 97 %
FVC-%Pred-Pre: 88 %
FVC-Post: 2.63 L
FVC-Pre: 2.38 L
POST FEV1/FVC RATIO: 74 %
PRE FEV6/FVC RATIO: 100 %
Post FEV6/FVC ratio: 100 %
Pre FEV1/FVC ratio: 73 %
RV % PRED: 138 %
RV: 3.25 L
TLC % pred: 104 %
TLC: 5.74 L

## 2017-08-20 MED ORDER — ALBUTEROL SULFATE (2.5 MG/3ML) 0.083% IN NEBU
2.5000 mg | INHALATION_SOLUTION | Freq: Once | RESPIRATORY_TRACT | Status: AC
  Administered 2017-08-20: 2.5 mg via RESPIRATORY_TRACT

## 2017-08-23 ENCOUNTER — Observation Stay (HOSPITAL_COMMUNITY)
Admission: AD | Admit: 2017-08-23 | Discharge: 2017-08-24 | Disposition: A | Discharge status: Home / Self Care | Payer: Medicare HMO | Source: Ambulatory Visit | Attending: Cardiovascular Disease | Admitting: Cardiovascular Disease

## 2017-08-23 DIAGNOSIS — I129 Hypertensive chronic kidney disease with stage 1 through stage 4 chronic kidney disease, or unspecified chronic kidney disease: Secondary | ICD-10-CM | POA: Insufficient documentation

## 2017-08-23 DIAGNOSIS — J449 Chronic obstructive pulmonary disease, unspecified: Principal | ICD-10-CM | POA: Insufficient documentation

## 2017-08-23 DIAGNOSIS — Z87442 Personal history of urinary calculi: Secondary | ICD-10-CM | POA: Diagnosis not present

## 2017-08-23 DIAGNOSIS — R0602 Shortness of breath: Secondary | ICD-10-CM

## 2017-08-23 DIAGNOSIS — E669 Obesity, unspecified: Secondary | ICD-10-CM | POA: Diagnosis not present

## 2017-08-23 DIAGNOSIS — K449 Diaphragmatic hernia without obstruction or gangrene: Secondary | ICD-10-CM | POA: Insufficient documentation

## 2017-08-23 DIAGNOSIS — Z6834 Body mass index (BMI) 34.0-34.9, adult: Secondary | ICD-10-CM | POA: Diagnosis not present

## 2017-08-23 DIAGNOSIS — R0600 Dyspnea, unspecified: Secondary | ICD-10-CM | POA: Diagnosis present

## 2017-08-23 DIAGNOSIS — E876 Hypokalemia: Secondary | ICD-10-CM | POA: Diagnosis not present

## 2017-08-23 DIAGNOSIS — K219 Gastro-esophageal reflux disease without esophagitis: Secondary | ICD-10-CM | POA: Diagnosis not present

## 2017-08-23 DIAGNOSIS — Z85118 Personal history of other malignant neoplasm of bronchus and lung: Secondary | ICD-10-CM | POA: Insufficient documentation

## 2017-08-23 DIAGNOSIS — F419 Anxiety disorder, unspecified: Secondary | ICD-10-CM | POA: Insufficient documentation

## 2017-08-23 DIAGNOSIS — Z79899 Other long term (current) drug therapy: Secondary | ICD-10-CM | POA: Insufficient documentation

## 2017-08-23 DIAGNOSIS — N182 Chronic kidney disease, stage 2 (mild): Secondary | ICD-10-CM | POA: Diagnosis not present

## 2017-08-23 DIAGNOSIS — E1122 Type 2 diabetes mellitus with diabetic chronic kidney disease: Secondary | ICD-10-CM | POA: Insufficient documentation

## 2017-08-23 DIAGNOSIS — I10 Essential (primary) hypertension: Secondary | ICD-10-CM | POA: Diagnosis present

## 2017-08-23 DIAGNOSIS — E119 Type 2 diabetes mellitus without complications: Secondary | ICD-10-CM

## 2017-08-23 DIAGNOSIS — Z720 Tobacco use: Secondary | ICD-10-CM | POA: Insufficient documentation

## 2017-08-23 DIAGNOSIS — Z7984 Long term (current) use of oral hypoglycemic drugs: Secondary | ICD-10-CM | POA: Diagnosis not present

## 2017-08-23 DIAGNOSIS — M5090 Cervical disc disorder, unspecified, unspecified cervical region: Secondary | ICD-10-CM | POA: Diagnosis present

## 2017-08-23 LAB — COMPREHENSIVE METABOLIC PANEL
ALK PHOS: 101 U/L (ref 38–126)
ALT: 7 U/L — AB (ref 14–54)
AST: 14 U/L — AB (ref 15–41)
Albumin: 3.1 g/dL — ABNORMAL LOW (ref 3.5–5.0)
Anion gap: 5 (ref 5–15)
BUN: 11 mg/dL (ref 6–20)
CALCIUM: 8.7 mg/dL — AB (ref 8.9–10.3)
CHLORIDE: 102 mmol/L (ref 101–111)
CO2: 29 mmol/L (ref 22–32)
CREATININE: 1.2 mg/dL — AB (ref 0.44–1.00)
GFR, EST AFRICAN AMERICAN: 52 mL/min — AB (ref >60)
GFR, EST NON AFRICAN AMERICAN: 45 mL/min — AB (ref >60)
Glucose, Bld: 138 mg/dL — ABNORMAL HIGH (ref 65–99)
Potassium: 3.4 mmol/L — ABNORMAL LOW (ref 3.5–5.1)
Sodium: 136 mmol/L (ref 135–145)
Total Bilirubin: 0.4 mg/dL (ref 0.3–1.2)
Total Protein: 6.1 g/dL — ABNORMAL LOW (ref 6.5–8.1)

## 2017-08-23 LAB — CBC WITH DIFFERENTIAL/PLATELET
BASOS PCT: 1 %
Basophils Absolute: 0.1 10*3/uL (ref 0.0–0.1)
EOS ABS: 0.7 10*3/uL (ref 0.0–0.7)
EOS PCT: 9 %
HCT: 34.8 % — ABNORMAL LOW (ref 36.0–46.0)
HEMOGLOBIN: 11.5 g/dL — AB (ref 12.0–15.0)
LYMPHS ABS: 3.7 10*3/uL (ref 0.7–4.0)
Lymphocytes Relative: 44 %
MCH: 29.2 pg (ref 26.0–34.0)
MCHC: 33 g/dL (ref 30.0–36.0)
MCV: 88.3 fL (ref 78.0–100.0)
MONOS PCT: 6 %
Monocytes Absolute: 0.5 10*3/uL (ref 0.1–1.0)
NEUTROS PCT: 40 %
Neutro Abs: 3.3 10*3/uL (ref 1.7–7.7)
PLATELETS: 321 10*3/uL (ref 150–400)
RBC: 3.94 MIL/uL (ref 3.87–5.11)
RDW: 13.3 % (ref 11.5–15.5)
WBC: 8.4 10*3/uL (ref 4.0–10.5)

## 2017-08-23 LAB — HEMOGLOBIN A1C
HEMOGLOBIN A1C: 6.4 % — AB (ref 4.8–5.6)
MEAN PLASMA GLUCOSE: 136.98 mg/dL

## 2017-08-23 MED ORDER — METOPROLOL SUCCINATE ER 25 MG PO TB24
25.0000 mg | ORAL_TABLET | Freq: Every day | ORAL | Status: DC
  Administered 2017-08-24: 25 mg via ORAL
  Filled 2017-08-23: qty 1

## 2017-08-23 MED ORDER — ALPRAZOLAM 0.25 MG PO TABS: 0.2500 mg | ORAL_TABLET | Freq: Every day | ORAL | Status: DC | PRN

## 2017-08-23 MED ORDER — ROSUVASTATIN CALCIUM 10 MG PO TABS
10.0000 mg | ORAL_TABLET | Freq: Every day | ORAL | Status: DC
  Administered 2017-08-24: 10 mg via ORAL
  Filled 2017-08-23: qty 1

## 2017-08-23 MED ORDER — AMLODIPINE BESYLATE 5 MG PO TABS
5.0000 mg | ORAL_TABLET | Freq: Two times a day (BID) | ORAL | Status: DC
  Administered 2017-08-23 – 2017-08-24 (×2): 5 mg via ORAL
  Filled 2017-08-23 (×2): qty 1

## 2017-08-23 MED ORDER — ALBUTEROL SULFATE (2.5 MG/3ML) 0.083% IN NEBU
2.5000 mg | INHALATION_SOLUTION | Freq: Four times a day (QID) | RESPIRATORY_TRACT | Status: DC
  Administered 2017-08-24 (×3): 2.5 mg via RESPIRATORY_TRACT
  Filled 2017-08-23 (×3): qty 3

## 2017-08-23 MED ORDER — PANTOPRAZOLE SODIUM 40 MG PO TBEC
40.0000 mg | DELAYED_RELEASE_TABLET | Freq: Every day | ORAL | Status: DC
  Administered 2017-08-24: 40 mg via ORAL
  Filled 2017-08-23: qty 1

## 2017-08-23 MED ORDER — SITAGLIPTIN PHOS-METFORMIN HCL 50-500 MG PO TABS: 1.0000 | ORAL_TABLET | Freq: Every day | ORAL | Status: DC

## 2017-08-23 MED ORDER — METHYLPREDNISOLONE SODIUM SUCC 125 MG IJ SOLR
60.0000 mg | Freq: Every day | INTRAMUSCULAR | Status: AC
  Administered 2017-08-23 – 2017-08-24 (×2): 60 mg via INTRAVENOUS
  Filled 2017-08-23 (×2): qty 2

## 2017-08-23 MED ORDER — METFORMIN HCL 500 MG PO TABS
500.0000 mg | ORAL_TABLET | Freq: Every day | ORAL | Status: DC
  Administered 2017-08-24: 500 mg via ORAL
  Filled 2017-08-23: qty 1

## 2017-08-23 MED ORDER — POTASSIUM CHLORIDE CRYS ER 10 MEQ PO TBCR
10.0000 meq | EXTENDED_RELEASE_TABLET | Freq: Every day | ORAL | Status: DC
  Administered 2017-08-24: 10 meq via ORAL
  Filled 2017-08-23: qty 1

## 2017-08-23 MED ORDER — CYCLOBENZAPRINE HCL 10 MG PO TABS: 10.0000 mg | ORAL_TABLET | Freq: Every evening | ORAL | Status: DC | PRN

## 2017-08-23 MED ORDER — HEPARIN SODIUM (PORCINE) 5000 UNIT/ML IJ SOLN
5000.0000 [IU] | Freq: Three times a day (TID) | INTRAMUSCULAR | Status: DC
  Administered 2017-08-23 – 2017-08-24 (×3): 5000 [IU] via SUBCUTANEOUS
  Filled 2017-08-23 (×3): qty 1

## 2017-08-23 MED ORDER — ONDANSETRON 4 MG PO TBDP: 4.0000 mg | ORAL_TABLET | Freq: Three times a day (TID) | ORAL | Status: DC | PRN

## 2017-08-23 MED ORDER — LINAGLIPTIN 5 MG PO TABS
5.0000 mg | ORAL_TABLET | Freq: Every day | ORAL | Status: DC
  Administered 2017-08-24: 5 mg via ORAL
  Filled 2017-08-23: qty 1

## 2017-08-23 MED ORDER — SODIUM CHLORIDE 0.9 % IV SOLN
INTRAVENOUS | Status: DC
  Administered 2017-08-23: via INTRAVENOUS

## 2017-08-23 MED ORDER — CLONIDINE HCL 0.1 MG PO TABS: 0.1000 mg | ORAL_TABLET | Freq: Two times a day (BID) | ORAL | Status: DC | PRN

## 2017-08-23 NOTE — H&P (Signed)
Referring Physician:   Jadalynn F Pfefferkorn is an 71 y.o. female.                       Chief Complaint: Shortness of breath and cough  HPI: 71 year old female with ongoing evaluation for pulmonary nodule suggestive of primary squamous cell/bronchogenic CA, has cough and shortness of breath. She has PMH of DM, II, COPD, hypertension, headaches, back and neck pain and obesity. No fever.   Past Medical History:  Diagnosis Date  . Anxiety   . Arthritis   . Chronic back pain greater than 3 months duration   . Chronic neck pain   . COPD (chronic obstructive pulmonary disease) ([place])   . Diabetes mellitus   . Dyspnea   . GERD (gastroesophageal reflux disease)   . H/O hiatal hernia   . Headache(784.0)   . Hemorrhoids   . Herniated disc   . History of kidney stones   . Hx of echocardiogram 2010   normal EF  . Hypertension   . Palpitations   . Shortness of breath on exertion 11/10/11   "sometimes"  . Vertigo       Past Surgical History:  Procedure Laterality Date  . ABDOMINAL HYSTERECTOMY  1997?   "partial"  . ANTERIOR CERVICAL DECOMP/DISCECTOMY FUSION N/A 12/25/2016   Procedure: ANTERIOR CERVICAL DECOMPRESSION/DISCECTOMY FUSION CERVICAL FOUR CERVICAL FIVE;  Surgeon: Ashok Pall, MD;  Location: [facility];  Service: Neurosurgery;  Laterality: N/A;  . CHOLECYSTECTOMY  ~ 2008  . COLONOSCOPY    . FRACTURE SURGERY  2004?   right shoulder  . KNEE ARTHROSCOPY  2004   right  . KNEE SURGERY     right  . neck fusion    . SHOULDER OPEN ROTATOR CUFF REPAIR  07/20/2011   left  . TONSILLECTOMY      No family history on file. Social History:  reports that she quit smoking about 6 weeks ago. Her smoking use included Cigarettes. She has a 7.50 pack-year smoking history. She has never used smokeless tobacco. She reports that she does not drink alcohol or use drugs.  Allergies:  Allergies  Allergen Reactions  . Oxycodone Itching    Medications Prior to Admission  Medication Sig Dispense Refill  .  albuterol (PROVENTIL) (2.5 MG/3ML) 0.083% nebulizer solution USE 1 VIAL EVERY 6 HOURS AS NEEDED FOR SHORTNESS OF BREATH  0  . albuterol (VENTOLIN HFA) 108 (90 Base) MCG/ACT inhaler Inhale 2 puffs into the lungs every 6 (six) hours as needed for wheezing or shortness of breath.    . ALPRAZolam (XANAX) 0.25 MG tablet Take 0.25 mg by mouth daily as needed for anxiety.   2  . amLODipine (NORVASC) 5 MG tablet Take 5 mg by mouth 2 (two) times daily.     . cloNIDine (CATAPRES) 0.1 MG tablet Take 0.1 mg by mouth 2 (two) times daily as needed (high blood pressure).     . conjugated estrogens (PREMARIN) vaginal cream Use 1/2 applicator 3 times weekly. 42.5 g 12  . cyclobenzaprine (FLEXERIL) 10 MG tablet Take 10 mg by mouth at bedtime as needed for muscle spasms.    Marland Kitchen esomeprazole (NEXIUM) 40 MG capsule Take 1 capsule (40 mg total) by mouth daily. 30 capsule 2  . gabapentin (NEURONTIN) 100 MG capsule Take 100 mg by mouth daily as needed (for leg pain).   1  . hydrALAZINE (APRESOLINE) 50 MG tablet Take 50 mg by mouth 3 (three) times daily.     Marland Kitchen  JANUMET 50-500 MG tablet Take 1 tablet by mouth daily.  6  . KLOR-CON M10 10 MEQ tablet Take 1 tablet (10 mEq total) by mouth daily.    . metoprolol succinate (TOPROL-XL) 25 MG 24 hr tablet Take 25 mg by mouth daily.  1  . ondansetron (ZOFRAN ODT) 4 MG disintegrating tablet Take 1 tablet (4 mg total) by mouth every 8 (eight) hours as needed for nausea or vomiting. (Patient taking differently: Take 4 mg by mouth every 8 (eight) hours as needed for nausea or vomiting. DISSOLVE IN THE MOUTH) 20 tablet 0  . rosuvastatin (CRESTOR) 10 MG tablet Take 10 mg by mouth daily.    Marland Kitchen umeclidinium bromide (INCRUSE ELLIPTA) 62.5 MCG/INH AEPB Inhale 1 puff into the lungs daily. 30 each 5    No results found for this or any previous visit (from the past 48 hour(s)). No results found.  Review Of Systems Constitutional: No fever, chills, weight loss or gain. Eyes: No vision change,  wears glasses. No discharge or pain. Ears: No hearing loss, No tinnitus. Respiratory: No asthma, Positive COPD, pneumonias. No shortness of breath. No hemoptysis. Cardiovascular: Positive chest pain, palpitation, leg edema. Gastrointestinal: No nausea, vomiting, diarrhea, constipation. No GI bleed. No hepatitis. Genitourinary: No dysuria, hematuria, kidney stone. No incontinance. Neurological: Positive headache, no stroke, seizures.  Psychiatry: No psych facility admission for anxiety, depression, suicide. No detox. Skin: No rash. Musculoskeletal: NPositive joint pain, no fibromyalgia. Positive neck pain, back pain. Lymphadenopathy: No lymphadenopathy. Hematology: No anemia or easy bruising.   Blood pressure (!) 143/75, pulse 69, temperature 98.2 F (36.8 C), temperature source Oral, resp. rate 18, height 5\' 7"  (1.702 m), weight 101.3 kg (223 lb 6.4 oz), SpO2 99 %. Body mass index is 34.99 kg/m. General appearance: alert, cooperative, appears stated age and no distress Head: Normocephalic, atraumatic. Eyes: Brown eyes, pink conjunctiva, corneas clear. PERRL, EOM's intact. Neck: No adenopathy, no carotid bruit, no JVD, supple, symmetrical, trachea midline and thyroid not enlarged. Resp: Coarse to auscultation bilaterally. Wheezing with cough. Cardio: Regular rate and rhythm, S1, S2 normal, II/VI systolic murmur, no click, rub or gallop GI: Soft, non-tender; bowel sounds normal; no organomegaly. Extremities: No edema, cyanosis or clubbing. Skin: Warm and dry.  Neurologic: Alert and oriented X 3, normal strength. Normal coordination and gait.  Assessment/Plan Shortness of breath Cough R/O pneumonia H/O right lung mass, possible squamous cell CA Hypertension DM, II Obesity Tobacco use disorder  Place in observation. Chest x-ray. IV solumedrol. Home medications.  Birdie Riddle, MD  08/23/2017, 10:22 PM

## 2017-08-24 ENCOUNTER — Ambulatory Visit: Payer: Medicare HMO | Admitting: Thoracic Surgery (Cardiothoracic Vascular Surgery)

## 2017-08-24 ENCOUNTER — Encounter (HOSPITAL_COMMUNITY): Payer: Self-pay

## 2017-08-24 ENCOUNTER — Observation Stay (HOSPITAL_COMMUNITY): Payer: Medicare HMO

## 2017-08-24 DIAGNOSIS — J449 Chronic obstructive pulmonary disease, unspecified: Secondary | ICD-10-CM | POA: Diagnosis not present

## 2017-08-24 LAB — CBC
HEMATOCRIT: 37.6 % (ref 36.0–46.0)
HEMOGLOBIN: 12.6 g/dL (ref 12.0–15.0)
MCH: 29.6 pg (ref 26.0–34.0)
MCHC: 33.5 g/dL (ref 30.0–36.0)
MCV: 88.3 fL (ref 78.0–100.0)
Platelets: 355 10*3/uL (ref 150–400)
RBC: 4.26 MIL/uL (ref 3.87–5.11)
RDW: 13.3 % (ref 11.5–15.5)
WBC: 5.4 10*3/uL (ref 4.0–10.5)

## 2017-08-24 LAB — BASIC METABOLIC PANEL
ANION GAP: 9 (ref 5–15)
BUN: 9 mg/dL (ref 6–20)
CHLORIDE: 103 mmol/L (ref 101–111)
CO2: 24 mmol/L (ref 22–32)
CREATININE: 1.19 mg/dL — AB (ref 0.44–1.00)
Calcium: 9 mg/dL (ref 8.9–10.3)
GFR calc non Af Amer: 45 mL/min — ABNORMAL LOW (ref >60)
GFR, EST AFRICAN AMERICAN: 52 mL/min — AB (ref >60)
Glucose, Bld: 266 mg/dL — ABNORMAL HIGH (ref 65–99)
POTASSIUM: 3.8 mmol/L (ref 3.5–5.1)
Sodium: 136 mmol/L (ref 135–145)

## 2017-08-24 LAB — GLUCOSE, CAPILLARY
GLUCOSE-CAPILLARY: 182 mg/dL — AB (ref 65–99)
Glucose-Capillary: 216 mg/dL — ABNORMAL HIGH (ref 65–99)

## 2017-08-24 MED ORDER — INSULIN ASPART 100 UNIT/ML ~~LOC~~ SOLN
0.0000 [IU] | Freq: Three times a day (TID) | SUBCUTANEOUS | Status: DC
  Administered 2017-08-24: 7 [IU] via SUBCUTANEOUS

## 2017-08-24 MED ORDER — INSULIN ASPART 100 UNIT/ML ~~LOC~~ SOLN
6.0000 [IU] | Freq: Three times a day (TID) | SUBCUTANEOUS | Status: DC
  Administered 2017-08-24: 6 [IU] via SUBCUTANEOUS

## 2017-08-24 MED ORDER — PREDNISONE 10 MG PO TABS: 10.0000 mg | ORAL_TABLET | Freq: Every day | ORAL | 1 refills | Status: DC

## 2017-08-24 MED ORDER — PREDNISONE 10 MG PO TABS: 10.0000 mg | ORAL_TABLET | Freq: Every day | ORAL | Status: DC

## 2017-08-24 NOTE — Care Management Obs Status (Signed)
[facility] NOTIFICATION   Patient Details  Name: [name] MRN: [id number] Date of Birth: 1945/12/04   Medicare Observation Status Notification Given:  Yes    Carles Collet, RN 08/24/2017, 12:12 PM

## 2017-08-24 NOTE — Discharge Summary (Signed)
Physician Discharge Summary  Patient ID: [name] MRN: [id number] DOB/AGE: Feb 17, 1946 71 y.o.  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 Her chest x-ray was negative for pneumonia and WBC count was normal. She improved with IV solumedrol, IV fluids and breathing treatments. She understood need to f/u with CVTS doctor- Roxan Hockey, MD in 1 week or so and possibly undergo surgery for intention to cure her lung cancer. She was discharged home with follow up by me in 1 month or earlier as needed.  Consults: cardiology  Significant Diagnostic Studies: labs: Near normal CBC, mild hypokalemia, creatinine of 1.20.  EKG: NSR.  Chest x-ray : stable.  Treatments: respiratory therapy: Albuterol nebulizer and inhaler as needed. Cardiac medications: amlodipine, Clonidine, Potassium, metoprolol and rosuvastatin.  Discharge Exam: Blood pressure (!) 155/73, pulse 78, temperature 98.3 F (36.8 C), temperature source Oral, resp. rate 16, height 5\' 7"  (1.702 m), weight 101.3 kg (223 lb 5.2 oz), SpO2 98 %. General appearance: alert, cooperative and appears stated age. Head: Normocephalic, atraumatic. Eyes: Brown eyes, pink  conjunctiva, corneas clear. PERRL, EOM's intact.  Neck: No adenopathy, no carotid bruit, no JVD, supple, symmetrical, trachea midline and thyroid not enlarged. Resp: Clear to auscultation bilaterally. Mild wheezing on cough. Cardio: Regular rate and rhythm, S1, S2 normal, II/VI systolic murmur, no click, rub or gallop. GI: Soft, non-tender; bowel sounds normal; no organomegaly. Extremities: No edema, cyanosis or clubbing. Skin: Warm and dry.  Neurologic: Alert and oriented X 3, normal strength and tone. Normal coordination and gait.  Disposition: 01-Home or Self Care   Allergies as of 08/24/2017      Reactions   Oxycodone Itching      Medication List    TAKE these medications   VENTOLIN HFA 108 (90 Base) MCG/ACT inhaler Generic drug:  albuterol Inhale 2 puffs into the lungs every 6 (six) hours as needed for wheezing or shortness of breath.   albuterol (2.5 MG/3ML) 0.083% nebulizer solution Commonly known as:  PROVENTIL USE 1 VIAL EVERY 6 HOURS AS NEEDED FOR SHORTNESS OF BREATH   ALPRAZolam 0.25 MG tablet Commonly known as:  XANAX Take 0.25 mg by mouth daily as needed for anxiety.   amLODipine 5 MG tablet Commonly known as:  NORVASC Take 5 mg by mouth 2 (two) times daily.   cloNIDine 0.1 MG tablet Commonly known as:  CATAPRES Take 0.1 mg by mouth 2 (two) times daily as needed (high blood pressure).   cyclobenzaprine 10 MG tablet Commonly known as:  FLEXERIL Take 10 mg by mouth at bedtime as needed for muscle spasms.   gabapentin 100 MG capsule Commonly known as:  NEURONTIN Take 100 mg by mouth daily as needed (for leg pain).   hydrALAZINE 50 MG  tablet Commonly known as:  APRESOLINE Take 50 mg by mouth 3 (three) times daily.   JANUMET 50-500 MG tablet Generic drug:  sitaGLIPtin-metformin Take 1 tablet by mouth daily.   KLOR-CON M10 10 MEQ tablet Generic drug:  potassium chloride Take 1 tablet (10 mEq total) by mouth daily.   metoprolol succinate 25 MG 24 hr  tablet Commonly known as:  TOPROL-XL Take 25 mg by mouth daily.   ondansetron 4 MG disintegrating tablet Commonly known as:  ZOFRAN ODT Take 1 tablet (4 mg total) by mouth every 8 (eight) hours as needed for nausea or vomiting. What changed:  additional instructions   predniSONE 10 MG tablet Commonly known as:  DELTASONE Take 1 tablet (10 mg total) by mouth daily with breakfast. After 1 week, 1/2 tablet x 6 days.   rosuvastatin 10 MG tablet Commonly known as:  CRESTOR Take 10 mg by mouth daily.      Follow-up Information    Dixie Dials, MD. Schedule an appointment as soon as possible for a visit in 1 month(s).   Specialty:  Cardiology Contact information: [place] [address] [phone]           Signed: Birdie Riddle 08/24/2017, 5:30 PM

## 2017-08-24 NOTE — Progress Notes (Signed)
Pt given discharge instructions, prescriptions, and care notes. Pt verbalized understanding AEB no further questions or concerns at this time. IV was discontinued, no redness, pain, or swelling noted at this time. Telemetry discontinued and Centralized Telemetry was notified. Pt to leave the floor via wheelchair with staff in stable condition.

## 2017-08-24 NOTE — Progress Notes (Signed)
Patient arrived to the unit via wheelchair as a direct admit.  Patient is alert and oriented x4 .  Patient is ambulatory. Skin assessment complete. No skin issues.  Vital signs: temp 98.2 BP: 143/75; pulse 69 ; resp 18 and 99% on room air.  Educated the patient on how to reach the staff on the unit.  Placed the patient on cardiac monitoring.  Lowered the bed and placed the call light within reach. Notified the MD patient has arrived to the unit  Will continue to monitor

## 2017-08-31 ENCOUNTER — Other Ambulatory Visit: Payer: Self-pay

## 2017-08-31 ENCOUNTER — Ambulatory Visit (INDEPENDENT_AMBULATORY_CARE_PROVIDER_SITE_OTHER): Payer: Medicare HMO | Admitting: Thoracic Surgery (Cardiothoracic Vascular Surgery)

## 2017-08-31 ENCOUNTER — Encounter: Payer: Self-pay | Admitting: Thoracic Surgery (Cardiothoracic Vascular Surgery)

## 2017-08-31 VITALS — BP 174/79 | HR 69 | Ht 67.0 in | Wt 223.0 lb

## 2017-08-31 DIAGNOSIS — R911 Solitary pulmonary nodule: Secondary | ICD-10-CM | POA: Diagnosis not present

## 2017-08-31 NOTE — Progress Notes (Signed)
[address]       [place] [address]             [phone]       HPI: [name] returns to further discuss management of her right upper lobe nodule  [name] is a 71 year old woman with a past medical history significant for tobacco abuse, COPD, hypertension, hyperlipidemia, type 2 diabetes, arthritis, chronic neck and back pain, vertigo, hiatal hernia with reflux, and anxiety. She is admitted to the hospital in August with a cough, shortness of breath, and wheezing. She ruled out for an MI. She had a nuclear stress test that was low risk. She was treated with azithromycin for presumed bronchitis. Her symptoms improved but she continued to have some cough and wheezing. His partner workup showed a CT of the chest which showed a 2 cm cavitary right upper lobe mass concerning for primary bronchogenic carcinoma.  She followed up with Dr. Lamonte Sakai as an outpatient. A PET CT showed the nodule was hypermetabolic with an SUV of 15. There was a questionable small right paratracheal node.  I saw her in the office on September 19. She was still wheezing at that time. I recommended pulmonary function testing and also asked her to follow-up with Dr. Lamonte Sakai regarding her wheezing. He saw her the following week and started her on Spiriva once daily. She was readmitted to the hospital overnight with recurrent bronchitis last week. She was treated with antibiotics and her symptoms have improved.  Zubrod Score: At the time of surgery this patient's most appropriate activity status/level should be described as: []     0    Normal activity, no symptoms [x]     1    Restricted in physical strenuous activity but ambulatory, able to do out light work []     2    Ambulatory and capable of self care, unable to do work activities, up and about >50 % of waking hours                              []     3    Only limited self care, in bed greater than 50% of waking hours []     4    Completely disabled,  no self care, confined to bed or chair []     5    Moribund  Current Outpatient Prescriptions  Medication Sig Dispense Refill  . albuterol (PROVENTIL) (2.5 MG/3ML) 0.083% nebulizer solution USE 1 VIAL EVERY 6 HOURS AS NEEDED FOR SHORTNESS OF BREATH  0  . albuterol (VENTOLIN HFA) 108 (90 Base) MCG/ACT inhaler Inhale 2 puffs into the lungs every 6 (six) hours as needed for wheezing or shortness of breath.    . ALPRAZolam (XANAX) 0.25 MG tablet Take 0.25 mg by mouth daily as needed for anxiety.   2  . amLODipine (NORVASC) 5 MG tablet Take 5 mg by mouth 2 (two) times daily.     . cloNIDine (CATAPRES) 0.1 MG tablet Take 0.1 mg by mouth 2 (two) times daily as needed (high blood pressure).     . cyclobenzaprine (FLEXERIL) 10 MG tablet Take 10 mg by mouth at bedtime as needed for muscle spasms.    Marland Kitchen gabapentin (NEURONTIN) 100 MG capsule Take 100 mg by mouth daily as needed (for leg pain).   1  . hydrALAZINE (APRESOLINE) 50 MG tablet Take 50 mg by mouth 3 (three) times daily.     Marland Kitchen  JANUMET 50-500 MG tablet Take 1 tablet by mouth daily.  6  . KLOR-CON M10 10 MEQ tablet Take 1 tablet (10 mEq total) by mouth daily.    . metoprolol succinate (TOPROL-XL) 25 MG 24 hr tablet Take 25 mg by mouth daily.  1  . ondansetron (ZOFRAN ODT) 4 MG disintegrating tablet Take 1 tablet (4 mg total) by mouth every 8 (eight) hours as needed for nausea or vomiting. (Patient taking differently: Take 4 mg by mouth every 8 (eight) hours as needed for nausea or vomiting. DISSOLVE IN THE MOUTH) 20 tablet 0  . predniSONE (DELTASONE) 10 MG tablet Take 1 tablet (10 mg total) by mouth daily with breakfast. After 1 week, 1/2 tablet x 6 days. 10 tablet 1  . rosuvastatin (CRESTOR) 10 MG tablet Take 10 mg by mouth daily.     No current facility-administered medications for this visit.     Physical Exam BP (!) 174/79   Pulse 69   Ht 5\' 7"  (1.702 m)   Wt 223 lb (101.2 kg)   SpO2 98%   BMI 34.93 kg/m  Obese 71 year old woman in no  acute distress Alert and oriented 3 with no focal neurologic deficits No cervical or supraclavicular adenopathy Cardiac regular rate and rhythm with normal S1 and S2, no rubs murmurs or gallops Lungs clear bilaterally, no wheezing Abdomen soft and nontender No edema  Diagnostic Tests: Pulmonary function testing FVC 2.38 (88%) FEV1 1.74 (82%) FEV1 1.94 (92%) DLCO 20.94 (70%) NUCLEAR MEDICINE PET SKULL BASE TO THIGH  TECHNIQUE: 10.95 mCi F-18 FDG was injected intravenously. Full-ring PET imaging was performed from the skull base to thigh after the radiotracer. CT data was obtained and used for attenuation correction and anatomic localization.  FASTING BLOOD GLUCOSE:  Value: 140 mg/dl  COMPARISON:  None.  FINDINGS: NECK: 8 mm lymph node deep to the left parotid gland is identified with an SUV max equal to 2.77.  CHEST: No hypermetabolic axillary or supraclavicular lymph nodes.  Normal heart size. Aortic atherosclerosis. Calcifications in the LAD and RCA and left circumflex coronary artery is noted. Small right paratracheal lymph node measures 6 mm and has an SUV max equal to 3.3.  No pleural effusion. Cavitary nodule in the right upper lobe measures 2.5 cm and has an SU the max equal to 15.0. Mild changes of centrilobular emphysema identified. No additional hypermetabolic pulmonary nodules.  ABDOMEN/PELVIS: No abnormal hypermetabolic activity within the liver, pancreas, adrenal glands, or spleen. Aortic atherosclerosis identified. The infrarenal abdominal aorta measures 2.6 cm, image 132 of series 4. No hypermetabolic lymph nodes in the abdomen or pelvis.  SKELETON: No focal hypermetabolic activity to suggest skeletal metastasis.  IMPRESSION: 1. Intense FDG uptake associated with the cavitary nodule in the right upper lobe which is suspicious for primary bronchogenic carcinoma. Given the presence of internal cavitation this Jeslynn represent a squamous cell  carcinoma. Correlation with tissue sampling advise. 2. No evidence for hypermetabolic adenopathy or distant metastatic disease. 3. Small, 8 mm cervical lymph node deep to the left parotid gland exhibits mild FDG uptake (SUV max equals 2.77). Nonspecific. 4.  Aortic Atherosclerosis (ICD10-I70.0). 5. Infrarenal abdominal aortic ectasia. Ectatic abdominal aorta at risk for aneurysm development. Recommend followup by ultrasound in 5 years. This recommendation follows ACR consensus guidelines: White Paper of the ACR Incidental Findings Committee II on Vascular Findings. J Am Coll Radiol 2013; 10:789-794.   Electronically Signed   By: Kerby Moors M.D.   On: 07/28/2017 12:30 I again personally  reviewed the CT and PET CT images and concur with the findings noted above  Impression: [name] is a 71 year old woman was recently hospitalized with shortness of breath and wheezing. A CT of the chest revealed a 2 cm right upper lobe nodule that is highly suspicious for primary bronchogenic carcinoma. Other items in the differential included infectious or inflammatory nodules. Given her age, smoking history, and the appearance of the nodule is as 4 more likely to be lung cancer than anything else.  I discussed potential treatment options including surgical resection and radiation. I would favor proceeding directly to surgery with wedge resection be followed by lobectomy if the intraoperative frozen section was positive. I discussed the relative advantages and disadvantages of surgery versus radiation. She favors surgical resection.  We discussed the proposed operation in detail. I explained to her the need for general anesthesia, the incisions to be used, the use of a drainage tube postoperatively, the expected hospital stay, and the overall recovery. I informed her of the indications, risks, benefits, and alternatives. She understands the risks include, but are not limited to death, MI, DVT, PE,  stroke, bleeding, possible need for transfusion, infection, prolonged air leak, cardiac arrhythmias, as well as the possibility of other unforeseeable complications.  She accepts the risks and wishes to proceed.  Plan: Right VATS, wedge resection, possible right upper lobectomy on Thursday, 09/09/2017  Melrose Nakayama, MD [facility] [phone]

## 2017-09-03 NOTE — Pre-Procedure Instructions (Signed)
Chasmine F Sapp  09/03/2017      [id number] [address], [place] [address] Phone: [phone] Fax: [phone]    Your procedure is scheduled on 09/09/17.  [facility] Ad[facility] at 6 A.M.  Call this number if you have problems the morning of surgery:  [phone]   Remember:  Do not eat food or drink liquids after midnight.  Take these medicines the morning of surgery with A SIP OF WATER ---all inhalers,xanax,norvasc,clondine,hydralazine,metoprolol   Do not wear jewelry, make-up or nail polish.  Do not wear lotions, powders, or perfumes, or deoderant.  Do not shave 48 hours prior to surgery.  Men Charlot shave face and neck.  Do not bring valuables to the hospital.  [facility] is not responsible for any belongings or valuables.  Contacts, dentures or bridgework Reynalda not be worn into surgery.  Leave your suitcase in the car.  After surgery it Linn be brought to your room.  For patients admitted to the hospital, discharge time will be determined by your treatment team.  Patients discharged the day of surgery will not be allowed to drive home.   Name and phone number of your driver:   Do not take any aspirin,anti-inflammatories,vitamins,or herbal supplements 5-7 days prior to surgery. Special instructions:    Please read over the following fact sheets that you were given. MRSA Information    How to Manage Your Diabetes Before and After Surgery  Why is it important to control my blood sugar before and after surgery? . Improving blood sugar levels before and after surgery helps healing and can limit problems. . A way of improving blood sugar control is eating a healthy diet by: o  Eating less sugar and carbohydrates o  Increasing activity/exercise o  Talking with your doctor about reaching your blood sugar goals . High blood sugars (greater than 180 mg/dL) can  raise your risk of infections and slow your recovery, so you will need to focus on controlling your diabetes during the weeks before surgery. . Make sure that the doctor who takes care of your diabetes knows about your planned surgery including the date and location.  How do I manage my blood sugar before surgery? . Check your blood sugar at least 4 times a day, starting 2 days before surgery, to make sure that the level is not too high or low. o Check your blood sugar the morning of your surgery when you wake up and every 2 hours until you get to the Short Stay unit. . If your blood sugar is less than 70 mg/dL, you will need to treat for low blood sugar: o Do not take insulin. o Treat a low blood sugar (less than 70 mg/dL) with  cup of clear juice (cranberry or apple), 4 glucose tablets, OR glucose gel. o Recheck blood sugar in 15 minutes after treatment (to make sure it is greater than 70 mg/dL). If your blood sugar is not greater than 70 mg/dL on recheck, call [phone] for further instructions. . Report your blood sugar to the short stay nurse when you get to Short Stay.  . If you are admitted to the hospital after surgery: o Your blood sugar will be checked by the staff and you will probably be given insulin after surgery (instead of oral diabetes medicines) to make sure you have good blood sugar levels. o The goal for  blood sugar control after surgery is 80-180 mg/dL.              WHAT DO I DO ABOUT MY DIABETES MEDICATION?   Marland Kitchen Do not take oral diabetes medicines (pills) the morning of surgery.  . THE NIGHT BEFORE SURGERY, take ___________ units of ___________insulin.       Marland Kitchen HE MORNING OF SURGERY, take _____________ units of __________insulin.  . The day of surgery, do not take other diabetes injectables, including Byetta (exenatide), Bydureon (exenatide ER), Victoza (liraglutide), or Trulicity (dulaglutide).  . If your CBG is greater than 220 mg/dL, you Daliyah take  of  your sliding scale (correction) dose of insulin.  Other Instructions:          Patient Signature:  Date:   Nurse Signature:  Date:   Reviewed and Endorsed by [facility] Patient Education Committee, August 2015

## 2017-09-06 ENCOUNTER — Encounter (HOSPITAL_COMMUNITY)
Admission: RE | Admit: 2017-09-06 | Discharge: 2017-09-06 | Disposition: A | Discharge status: Home / Self Care | Payer: Medicare HMO | Source: Ambulatory Visit | Attending: Thoracic Surgery (Cardiothoracic Vascular Surgery) | Admitting: Thoracic Surgery (Cardiothoracic Vascular Surgery)

## 2017-09-06 ENCOUNTER — Encounter (HOSPITAL_COMMUNITY): Payer: Self-pay

## 2017-09-06 ENCOUNTER — Ambulatory Visit (HOSPITAL_COMMUNITY)
Admission: RE | Admit: 2017-09-06 | Discharge: 2017-09-06 | Disposition: A | Discharge status: Home / Self Care | Payer: Medicare HMO | Source: Ambulatory Visit | Attending: Thoracic Surgery (Cardiothoracic Vascular Surgery) | Admitting: Thoracic Surgery (Cardiothoracic Vascular Surgery)

## 2017-09-06 DIAGNOSIS — Z01818 Encounter for other preprocedural examination: Secondary | ICD-10-CM | POA: Insufficient documentation

## 2017-09-06 DIAGNOSIS — R911 Solitary pulmonary nodule: Secondary | ICD-10-CM | POA: Diagnosis not present

## 2017-09-06 HISTORY — DX: Cough: R05

## 2017-09-06 HISTORY — DX: Cough, unspecified: R05.9

## 2017-09-06 LAB — URINALYSIS, ROUTINE W REFLEX MICROSCOPIC
Bilirubin Urine: NEGATIVE
Glucose, UA: NEGATIVE mg/dL
Hgb urine dipstick: NEGATIVE
Ketones, ur: NEGATIVE mg/dL
LEUKOCYTES UA: NEGATIVE
NITRITE: NEGATIVE
PROTEIN: NEGATIVE mg/dL
SPECIFIC GRAVITY, URINE: 1.003 — AB (ref 1.005–1.030)
pH: 5 (ref 5.0–8.0)

## 2017-09-06 LAB — BLOOD GAS, ARTERIAL
Acid-Base Excess: 0.3 mmol/L (ref 0.0–2.0)
BICARBONATE: 23.9 mmol/L (ref 20.0–28.0)
Drawn by: 470591
FIO2: 21
O2 Saturation: 96.6 %
PH ART: 7.441 (ref 7.350–7.450)
PO2 ART: 85.7 mmHg (ref 83.0–108.0)
Patient temperature: 98.6
pCO2 arterial: 35.8 mmHg (ref 32.0–48.0)

## 2017-09-06 LAB — COMPREHENSIVE METABOLIC PANEL
ALBUMIN: 3.6 g/dL (ref 3.5–5.0)
ALK PHOS: 118 U/L (ref 38–126)
ALT: 7 U/L — AB (ref 14–54)
ANION GAP: 9 (ref 5–15)
AST: 14 U/L — ABNORMAL LOW (ref 15–41)
BILIRUBIN TOTAL: 0.3 mg/dL (ref 0.3–1.2)
BUN: 15 mg/dL (ref 6–20)
CALCIUM: 9.1 mg/dL (ref 8.9–10.3)
CO2: 23 mmol/L (ref 22–32)
CREATININE: 1.22 mg/dL — AB (ref 0.44–1.00)
Chloride: 101 mmol/L (ref 101–111)
GFR calc Af Amer: 51 mL/min — ABNORMAL LOW (ref >60)
GFR calc non Af Amer: 44 mL/min — ABNORMAL LOW (ref >60)
GLUCOSE: 124 mg/dL — AB (ref 65–99)
Potassium: 4.3 mmol/L (ref 3.5–5.1)
Sodium: 133 mmol/L — ABNORMAL LOW (ref 135–145)
TOTAL PROTEIN: 7.4 g/dL (ref 6.5–8.1)

## 2017-09-06 LAB — TYPE AND SCREEN
ABO/RH(D): A POS
Antibody Screen: NEGATIVE

## 2017-09-06 LAB — SURGICAL PCR SCREEN
MRSA, PCR: NEGATIVE
Staphylococcus aureus: NEGATIVE

## 2017-09-06 LAB — PROTIME-INR
INR: 0.88
Prothrombin Time: 11.9 seconds (ref 11.4–15.2)

## 2017-09-06 LAB — CBC
HEMATOCRIT: 40.9 % (ref 36.0–46.0)
HEMOGLOBIN: 13.4 g/dL (ref 12.0–15.0)
MCH: 29 pg (ref 26.0–34.0)
MCHC: 32.8 g/dL (ref 30.0–36.0)
MCV: 88.5 fL (ref 78.0–100.0)
Platelets: 303 10*3/uL (ref 150–400)
RBC: 4.62 MIL/uL (ref 3.87–5.11)
RDW: 13.3 % (ref 11.5–15.5)
WBC: 10.3 10*3/uL (ref 4.0–10.5)

## 2017-09-06 LAB — APTT: aPTT: 29 seconds (ref 24–36)

## 2017-09-06 LAB — GLUCOSE, CAPILLARY: GLUCOSE-CAPILLARY: 149 mg/dL — AB (ref 65–99)

## 2017-09-08 ENCOUNTER — Inpatient Hospital Stay (HOSPITAL_COMMUNITY): Payer: Medicare HMO

## 2017-09-09 ENCOUNTER — Emergency Department (HOSPITAL_COMMUNITY)
Admission: RE | Admit: 2017-09-09 | Discharge: 2017-09-09 | Disposition: A | Discharge status: Home / Self Care | Payer: Medicare HMO | Source: Ambulatory Visit | Attending: Emergency Medicine | Admitting: Emergency Medicine

## 2017-09-09 ENCOUNTER — Telehealth: Payer: Self-pay | Admitting: Medical Oncology

## 2017-09-09 ENCOUNTER — Emergency Department (HOSPITAL_COMMUNITY): Payer: Medicare HMO

## 2017-09-09 ENCOUNTER — Telehealth: Payer: Self-pay | Admitting: Hematology and Oncology

## 2017-09-09 ENCOUNTER — Other Ambulatory Visit: Payer: Self-pay | Admitting: Radiation Therapy

## 2017-09-09 ENCOUNTER — Encounter (HOSPITAL_COMMUNITY): Payer: Self-pay | Admitting: Certified Registered Nurse Anesthetist

## 2017-09-09 ENCOUNTER — Encounter (HOSPITAL_COMMUNITY)
Admission: RE | Disposition: A | Discharge status: Home / Self Care | Payer: Self-pay | Source: Ambulatory Visit | Attending: Thoracic Surgery (Cardiothoracic Vascular Surgery)

## 2017-09-09 DIAGNOSIS — G936 Cerebral edema: Secondary | ICD-10-CM | POA: Diagnosis not present

## 2017-09-09 DIAGNOSIS — J449 Chronic obstructive pulmonary disease, unspecified: Secondary | ICD-10-CM | POA: Insufficient documentation

## 2017-09-09 DIAGNOSIS — R531 Weakness: Secondary | ICD-10-CM | POA: Diagnosis present

## 2017-09-09 DIAGNOSIS — C7931 Secondary malignant neoplasm of brain: Secondary | ICD-10-CM | POA: Insufficient documentation

## 2017-09-09 DIAGNOSIS — C7949 Secondary malignant neoplasm of other parts of nervous system: Principal | ICD-10-CM

## 2017-09-09 DIAGNOSIS — E119 Type 2 diabetes mellitus without complications: Secondary | ICD-10-CM | POA: Diagnosis not present

## 2017-09-09 DIAGNOSIS — R911 Solitary pulmonary nodule: Secondary | ICD-10-CM

## 2017-09-09 DIAGNOSIS — Z79899 Other long term (current) drug therapy: Secondary | ICD-10-CM | POA: Diagnosis not present

## 2017-09-09 DIAGNOSIS — Z7984 Long term (current) use of oral hypoglycemic drugs: Secondary | ICD-10-CM | POA: Insufficient documentation

## 2017-09-09 DIAGNOSIS — Z87891 Personal history of nicotine dependence: Secondary | ICD-10-CM | POA: Diagnosis not present

## 2017-09-09 DIAGNOSIS — R0602 Shortness of breath: Secondary | ICD-10-CM | POA: Diagnosis not present

## 2017-09-09 DIAGNOSIS — I10 Essential (primary) hypertension: Secondary | ICD-10-CM | POA: Insufficient documentation

## 2017-09-09 LAB — BASIC METABOLIC PANEL
Anion gap: 8 (ref 5–15)
BUN: 13 mg/dL (ref 6–20)
CO2: 25 mmol/L (ref 22–32)
CREATININE: 1.06 mg/dL — AB (ref 0.44–1.00)
Calcium: 8.7 mg/dL — ABNORMAL LOW (ref 8.9–10.3)
Chloride: 102 mmol/L (ref 101–111)
GFR calc Af Amer: >60 mL/min (ref >60)
GFR, EST NON AFRICAN AMERICAN: 52 mL/min — AB (ref >60)
Glucose, Bld: 123 mg/dL — ABNORMAL HIGH (ref 65–99)
Potassium: 4.2 mmol/L (ref 3.5–5.1)
SODIUM: 135 mmol/L (ref 135–145)

## 2017-09-09 LAB — HEPATIC FUNCTION PANEL
ALBUMIN: 3.4 g/dL — AB (ref 3.5–5.0)
ALK PHOS: 124 U/L (ref 38–126)
ALT: 9 U/L — AB (ref 14–54)
AST: 15 U/L (ref 15–41)
Bilirubin, Direct: <0.1 mg/dL — ABNORMAL LOW (ref 0.1–0.5)
TOTAL PROTEIN: 6.9 g/dL (ref 6.5–8.1)
Total Bilirubin: 0.5 mg/dL (ref 0.3–1.2)

## 2017-09-09 LAB — DIFFERENTIAL
Basophils Absolute: 0 10*3/uL (ref 0.0–0.1)
Basophils Relative: 1 %
EOS PCT: 7 %
Eosinophils Absolute: 0.5 10*3/uL (ref 0.0–0.7)
LYMPHS ABS: 2.4 10*3/uL (ref 0.7–4.0)
LYMPHS PCT: 30 %
MONO ABS: 0.5 10*3/uL (ref 0.1–1.0)
Monocytes Relative: 7 %
NEUTROS ABS: 4.4 10*3/uL (ref 1.7–7.7)
Neutrophils Relative %: 55 %

## 2017-09-09 LAB — I-STAT TROPONIN, ED: TROPONIN I, POC: 0.01 ng/mL (ref 0.00–0.08)

## 2017-09-09 LAB — CBC
HCT: 38.2 % (ref 36.0–46.0)
Hemoglobin: 12.9 g/dL (ref 12.0–15.0)
MCH: 30.1 pg (ref 26.0–34.0)
MCHC: 33.8 g/dL (ref 30.0–36.0)
MCV: 89 fL (ref 78.0–100.0)
PLATELETS: 303 10*3/uL (ref 150–400)
RBC: 4.29 MIL/uL (ref 3.87–5.11)
RDW: 13.1 % (ref 11.5–15.5)
WBC: 7.9 10*3/uL (ref 4.0–10.5)

## 2017-09-09 LAB — ETHANOL: Alcohol, Ethyl (B): <10 mg/dL (ref <10)

## 2017-09-09 LAB — PROTIME-INR
INR: 0.97
Prothrombin Time: 12.8 seconds (ref 11.4–15.2)

## 2017-09-09 LAB — APTT: APTT: 30 s (ref 24–36)

## 2017-09-09 LAB — GLUCOSE, CAPILLARY: GLUCOSE-CAPILLARY: 142 mg/dL — AB (ref 65–99)

## 2017-09-09 SURGERY — CANCELLED PROCEDURE
Anesthesia: General | Site: Chest | Laterality: Right

## 2017-09-09 MED ORDER — LACTATED RINGERS IV SOLN
INTRAVENOUS | Status: AC | PRN
  Administered 2017-09-09: 08:00:00 via INTRAVENOUS

## 2017-09-09 MED ORDER — MIDAZOLAM HCL 5 MG/5ML IJ SOLN
INTRAMUSCULAR | Status: AC | PRN
  Administered 2017-09-09: 1 mg via INTRAVENOUS

## 2017-09-09 MED ORDER — FENTANYL CITRATE (PF) 100 MCG/2ML IJ SOLN
INTRAMUSCULAR | Status: AC | PRN
  Administered 2017-09-09: 50 ug via INTRAVENOUS

## 2017-09-09 MED ORDER — PROPOFOL 10 MG/ML IV BOLUS
INTRAVENOUS | Status: AC
  Filled 2017-09-09: qty 40

## 2017-09-09 MED ORDER — LACTATED RINGERS IV SOLN
INTRAVENOUS | Status: AC | PRN
  Administered 2017-09-09: 07:00:00 via INTRAVENOUS

## 2017-09-09 MED ORDER — ROCURONIUM BROMIDE 50 MG/5ML IV SOLN
INTRAVENOUS | Status: AC
  Filled 2017-09-09: qty 2

## 2017-09-09 MED ORDER — DEXAMETHASONE SODIUM PHOSPHATE 10 MG/ML IJ SOLN
10.0000 mg | Freq: Once | INTRAMUSCULAR | Status: AC
  Administered 2017-09-09: 10 mg via INTRAVENOUS
  Filled 2017-09-09: qty 1

## 2017-09-09 MED ORDER — LORAZEPAM 2 MG/ML IJ SOLN
1.0000 mg | Freq: Once | INTRAMUSCULAR | Status: AC
  Administered 2017-09-09: 1 mg via INTRAVENOUS
  Filled 2017-09-09: qty 1

## 2017-09-09 MED ORDER — MIDAZOLAM HCL 2 MG/2ML IJ SOLN
INTRAMUSCULAR | Status: AC
  Filled 2017-09-09: qty 2

## 2017-09-09 MED ORDER — SODIUM CHLORIDE 0.9 % IV SOLN
500.0000 mg | Freq: Once | INTRAVENOUS | Status: AC
  Administered 2017-09-09: 500 mg via INTRAVENOUS
  Filled 2017-09-09: qty 5

## 2017-09-09 MED ORDER — DEXTROSE 5 % IV SOLN
1.5000 g | INTRAVENOUS | Status: DC
  Filled 2017-09-09: qty 1.5

## 2017-09-09 MED ORDER — LEVETIRACETAM 500 MG PO TABS
500.0000 mg | ORAL_TABLET | Freq: Two times a day (BID) | ORAL | 0 refills | Status: DC
Start: 2017-09-09 — End: 2017-10-11

## 2017-09-09 MED ORDER — GADOBENATE DIMEGLUMINE 529 MG/ML IV SOLN
20.0000 mL | Freq: Once | INTRAVENOUS | Status: AC
  Administered 2017-09-09: 20 mL via INTRAVENOUS

## 2017-09-09 MED ORDER — DEXAMETHASONE 4 MG PO TABS
4.0000 mg | ORAL_TABLET | Freq: Three times a day (TID) | ORAL | 0 refills | Status: DC
Start: 2017-09-09 — End: 2017-09-14

## 2017-09-09 MED ORDER — FENTANYL CITRATE (PF) 250 MCG/5ML IJ SOLN
INTRAMUSCULAR | Status: AC
  Filled 2017-09-09: qty 5

## 2017-09-09 NOTE — Progress Notes (Signed)
Patient presented for surgery and complained of right sided heaviness.  Surgery canceled by Dr.Hendrickson.  Patient transferred to ED by two nurses.

## 2017-09-09 NOTE — ED Triage Notes (Signed)
Pt presents to the ed from short stay. She was scheduled for a video assisted fluoroscopy and started complaining of right sided heaviness.  The patient was prepped for surgery and on assessment of the md sent here. She is alert and oriented with complaints of right sided heaviness and numbness and tingling in her right arm and leg that started yesterday morning when she woke up. Pt has an arterial line and central line present from the OR.

## 2017-09-09 NOTE — ED Provider Notes (Signed)
[facility] EMERGENCY DEPARTMENT Provider Note   CSN: [id number] Arrival date & time: 09/09/17  9702  History   Chief Complaint Chief Complaint  Patient presents with  . Weakness   HPI Sola F Boissonneault is a 71 y.o. female.  The patient is a 71 year old female with a medical history significant for COPD, diabetes, hypertension, GERD, anxiety, and vertigo, who presents to the ED complaining of weakness.  The patient presented to the hospital today for a VATS and possible right upper lobectomy for a lung nodule. Upon review of systems prior to her case, she informed the CT surgeon that she had been experiencing a sense of right upper and right lower extremity heaviness that began yesterday when she awoke.  She was sent to the ED for further evaluation.  At this time, she continues to complain of subjective heaviness in her right limbs.  She also reports numbness/tingling in her right lower extremity that is worse when she is standing. She denies headaches, vision changes, or other weakness. She denies pain at this time.   The history is provided by the patient and medical records. No language interpreter was used.   Past Medical History:  Diagnosis Date  . Anxiety   . Arthritis   . Chronic back pain greater than 3 months duration   . Chronic neck pain   . COPD (chronic obstructive pulmonary disease) ([place])   . Cough   . Diabetes mellitus   . Dyspnea   . GERD (gastroesophageal reflux disease)   . H/O hiatal hernia   . Headache(784.0)   . Hemorrhoids   . Herniated disc   . History of kidney stones   . Hx of echocardiogram 2010   normal EF  . Hypertension   . Palpitations   . Shortness of breath on exertion 11/10/11   "sometimes"  . Vertigo   . Vertigo     Patient Active Problem List   Diagnosis Date Noted  . GERD (gastroesophageal reflux disease) 08/16/2017  . Lung mass 08/16/2017  . Shortness of breath 07/05/2017    Class: Acute  . Vaginal odor 02/10/2017    . Dysuria 02/10/2017  . Osteoarthritis of spine with radiculopathy, cervical region 12/25/2016  . COPD (chronic obstructive pulmonary disease) ([place]) 10/11/2015  . Orthostatic hypotension 06/18/2013  . Numbness and tingling in right hand 11/10/2011    Class: Acute  . Hypertension 11/10/2011  . Cervical disc disease 11/10/2011    Class: Chronic  . DM II (diabetes mellitus, type II), controlled ([place]) 11/10/2011    Class: Chronic    Past Surgical History:  Procedure Laterality Date  . ABDOMINAL HYSTERECTOMY  1997?   "partial"  . ANTERIOR CERVICAL DECOMP/DISCECTOMY FUSION N/A 12/25/2016   Procedure: ANTERIOR CERVICAL DECOMPRESSION/DISCECTOMY FUSION CERVICAL FOUR CERVICAL FIVE;  Surgeon: Ashok Pall, MD;  Location: [facility];  Service: Neurosurgery;  Laterality: N/A;  . CHOLECYSTECTOMY  ~ 2008  . COLONOSCOPY    . EYE SURGERY     cateracts  . FRACTURE SURGERY  2004?   right shoulder  . KNEE ARTHROSCOPY  2004   right  . KNEE SURGERY     right  . neck fusion    . SHOULDER OPEN ROTATOR CUFF REPAIR  07/20/2011   left  . TONSILLECTOMY    hemoglobin was 12.9 OB History    No data available     Home Medications    Prior to Admission medications   Medication Sig Start Date End Date Taking? Authorizing Provider  albuterol (PROVENTIL) (2.5 MG/3ML) 0.083% nebulizer solution USE 1 VIAL EVERY 6 HOURS AS NEEDED FOR SHORTNESS OF BREATH 10/15/15  Yes [provider]  albuterol (VENTOLIN HFA) 108 (90 Base) MCG/ACT inhaler Inhale 2 puffs into the lungs every 6 (six) hours as needed for wheezing or shortness of breath.   Yes [provider]  ALPRAZolam (XANAX) 0.25 MG tablet Take 0.25 mg by mouth daily as needed for anxiety.  10/16/14  Yes [provider]  amLODipine (NORVASC) 5 MG tablet Take 5 mg by mouth 2 (two) times daily.    Yes [provider]  cholecalciferol (VITAMIN D) 1000 units tablet Take 1,000 Units by mouth daily.   Yes [provider]   cloNIDine (CATAPRES) 0.1 MG tablet Take 0.1 mg by mouth 2 (two) times daily as needed (high blood pressure).    Yes [provider]  conjugated estrogens (PREMARIN) vaginal cream Place 0.5 Applicatorfuls vaginally at bedtime as needed.   Yes [provider]  cyclobenzaprine (FLEXERIL) 10 MG tablet Take 10 mg by mouth at bedtime as needed for muscle spasms.   Yes [provider]  gabapentin (NEURONTIN) 100 MG capsule Take 100 mg by mouth daily as needed (for leg pain).  05/10/17  Yes [provider]  hydrALAZINE (APRESOLINE) 50 MG tablet Take 50 mg by mouth 3 (three) times daily.    Yes [provider]  JANUMET 50-500 MG tablet Take 1 tablet by mouth daily. 03/27/17  Yes [provider]  KLOR-CON M10 10 MEQ tablet Take 1 tablet (10 mEq total) by mouth daily. Patient taking differently: Take 10 mEq by mouth 2 (two) times daily.  07/07/17  Yes Dixie Dials, MD  loratadine (CLARITIN) 10 MG tablet Take 10 mg by mouth daily as needed for allergies.   Yes [provider]  metoprolol succinate (TOPROL-XL) 25 MG 24 hr tablet Take 25 mg by mouth daily. 03/20/17  Yes [provider]  ondansetron (ZOFRAN ODT) 4 MG disintegrating tablet Take 1 tablet (4 mg total) by mouth every 8 (eight) hours as needed for nausea or vomiting. Patient taking differently: Take 4 mg by mouth every 8 (eight) hours as needed for nausea or vomiting. DISSOLVE IN THE MOUTH 02/27/17  Yes Duffy Bruce, MD  predniSONE (DELTASONE) 10 MG tablet Take 1 tablet (10 mg total) by mouth daily with breakfast. After 1 week, 1/2 tablet x 6 days. Patient taking differently: Take 10 mg by mouth daily with breakfast. After 1 week, 1/2 tablet x 6 days. Should be completed by 09-04-17 08/24/17  Yes Dixie Dials, MD  dexamethasone (DECADRON) 4 MG tablet Take 1 tablet (4 mg total) by mouth 3 (three) times daily. 09/09/17 09/19/17  Charisse March, MD  levETIRAcetam (KEPPRA) 500 MG tablet  Take 1 tablet (500 mg total) by mouth 2 (two) times daily. 09/09/17 10/09/17  Charisse March, MD  rosuvastatin (CRESTOR) 10 MG tablet Take 10 mg by mouth daily.    [provider]   Family History History reviewed. No pertinent family history.  Social History Social History  Substance Use Topics  . Smoking status: Former Smoker    Packs/day: 0.25    Years: 30.00    Types: Cigarettes    Quit date: 07/08/2017  . Smokeless tobacco: Never Used     Comment: 4-5 cigs / day  . Alcohol use No   Allergies   Oxycodone  Review of Systems Review of Systems  Respiratory: Negative for cough and shortness of breath.  Cardiovascular: Negative for chest pain and leg swelling.  Gastrointestinal: Negative for abdominal pain, nausea and vomiting.  Genitourinary: Negative for dysuria.  Neurological: Positive for numbness. Negative for dizziness, tremors, seizures, syncope, facial asymmetry, speech difficulty, weakness, light-headedness and headaches.  Psychiatric/Behavioral: Negative.   All other systems reviewed and are negative.  Physical Exam Updated Vital Signs BP 136/81   Pulse 68   Temp (!) 97.4 F (36.3 C)   Resp 16   Wt 101.2 kg (223 lb)   SpO2 98%   BMI 34.93 kg/m   Physical Exam  Constitutional: She is oriented to person, place, and time. She appears well-developed and well-nourished. No distress.  HENT:  Head: Normocephalic and atraumatic.  Mouth/Throat: Oropharynx is clear and moist.  Eyes: Conjunctivae and EOM are normal. No scleral icterus.  Neck: Neck supple.  Right IJ catheter  Cardiovascular: Normal rate, regular rhythm, normal heart sounds and intact distal pulses.   No murmur heard. Pulmonary/Chest: Effort normal and breath sounds normal. No respiratory distress. She has no wheezes.  Abdominal: Soft. There is no tenderness.  Musculoskeletal: She exhibits no edema.  Neurological: She is alert and oriented to person, place, and time. She displays normal  reflexes. No cranial nerve deficit or sensory deficit (patient denies sensory changes on exam). She exhibits normal muscle tone. Coordination normal.  Skin: Skin is warm and dry.  Psychiatric: She has a normal mood and affect. Her behavior is normal. Judgment and thought content normal.  Nursing note and vitals reviewed.  ED Treatments / Results  Labs (all labs ordered are listed, but only abnormal results are displayed) Labs Reviewed  GLUCOSE, CAPILLARY - Abnormal; Notable for the following:       Result Value   Glucose-Capillary 142 (*)    All other components within normal limits  BASIC METABOLIC PANEL - Abnormal; Notable for the following:    Glucose, Bld 123 (*)    Creatinine, Ser 1.06 (*)    Calcium 8.7 (*)    GFR calc non Af Amer 52 (*)    All other components within normal limits  HEPATIC FUNCTION PANEL - Abnormal; Notable for the following:    Albumin 3.4 (*)    ALT 9 (*)    Bilirubin, Direct <0.1 (*)    All other components within normal limits  CBC  ETHANOL  PROTIME-INR  APTT  DIFFERENTIAL  RAPID URINE DRUG SCREEN, HOSP PERFORMED  URINALYSIS, ROUTINE W REFLEX MICROSCOPIC  I-STAT TROPONIN, ED    EKG  EKG Interpretation  Date/Time:  Thursday September 09 2017 08:43:55 EDT Ventricular Rate:  67 PR Interval:    QRS Duration: 91 QT Interval:  410 QTC Calculation: 433 R Axis:   -10 Text Interpretation:  Sinus rhythm Baseline wander in lead(s) V3 Otherwise within normal limits Confirmed by Carmin Muskrat [phone]) on 09/09/2017 8:57:58 AM Also confirmed by Carmin Muskrat ([id number]), editor Philomena Doheny [phone])  on 09/09/2017 9:15:05 AM      Radiology Ct Head Wo Contrast  Result Date: 09/09/2017 CLINICAL DATA:  Sensation of right arm and leg heaviness and numbness which began last night. History of lung carcinoma. EXAM: CT HEAD WITHOUT CONTRAST TECHNIQUE: Contiguous axial images were obtained from the base of the skull through the vertex without intravenous  contrast. COMPARISON:  Head CT scan 05/27/2017.  Brain MRI 03/13/2015. FINDINGS: Brain: Partially empty sella is noted. There is a new large focus of hypoattenuation high left frontal lobe with sparing of the gray matter most consistent with vasogenic edema  and presence of a mass lesion. No evidence of acute infarct, hemorrhage, hydrocephalus, pneumocephalus or midline shift. Vascular: Atherosclerosis noted. Skull: Intact.  No focal lesion. Sinuses/Orbits: Minimal ethmoid air cell disease is noted. Other: Negative. IMPRESSION: Large focus of vasogenic edema in the high left frontal lobe highly suspicious for metastatic disease in this patient with a history of lung carcinoma. Brain MRI with and without contrast is recommended for further evaluation. These results were called by telephone at the time of interpretation on 09/09/2017 at 10:04 am to Dr. Charisse March , who verbally acknowledged these results. Electronically Signed   By: Inge Rise M.D.   On: 09/09/2017 10:07   Mr Brain W And Wo Contrast  Result Date: 09/09/2017 CLINICAL DATA:  Right-sided weakness and heaviness, onset yesterday. Concerning pulmonary nodule on PET-CT. Patient was to have resection today. EXAM: MRI HEAD WITHOUT AND WITH CONTRAST TECHNIQUE: Multiplanar, multiecho pulse sequences of the brain and surrounding structures were obtained without and with intravenous contrast. CONTRAST:  25mL MULTIHANCE GADOBENATE DIMEGLUMINE 529 MG/ML IV SOLN COMPARISON:  Head CT from earlier today FINDINGS: Brain: Solidly enhancing mass in the parasagittal posterior left frontal lobe measuring 10 mm. There is moderate surrounding vasogenic edema. No second mass is seen. No infarct, hemorrhage, or hydrocephalus. Mild chronic microvascular ischemic type FLAIR hyperintensity in the cerebral white matter and pons. Vascular: Major flow voids are preserved Skull and upper cervical spine: Negative for marrow lesion Sinuses/Orbits: Negative IMPRESSION: 10 mm  left posterior frontal mass with moderate vasogenic edema, likely a solitary metastasis in this clinical setting. Electronically Signed   By: Monte Fantasia M.D.   On: 09/09/2017 12:15    Procedures Procedures (including critical care time)  Medications Ordered in ED Medications  LORazepam (ATIVAN) injection 1 mg (1 mg Intravenous Given 09/09/17 1050)  gadobenate dimeglumine (MULTIHANCE) injection 20 mL (20 mLs Intravenous Contrast Given 09/09/17 1149)  levETIRAcetam (KEPPRA) 500 mg in sodium chloride 0.9 % 100 mL IVPB (0 mg Intravenous Stopped 09/09/17 1430)  dexamethasone (DECADRON) injection 10 mg (10 mg Intravenous Given 09/09/17 1408)    Initial Impression / Assessment and Plan / ED Course  I have reviewed the triage vital signs and the nursing notes.  Pertinent labs & imaging results that were available during my care of the patient were reviewed by me and considered in my medical decision making (see chart for details).    Initial differential diagnosis included metabolic derangement, glucose abnormality, CVA, neuropathy, and metastatic disease.  Pertinent labs included a normal POC glucose.  CBC without leukocytosis, anemia, or an abnormal platelet count.  Mildly elevated creatinine, however at patient's baseline.  Normal LFTs.  Hypocalcemia noted.  Coags normal.  EKG with a normal heart rate in normal sinus rhythm. Left axis deviation noted. Normal PR interval, narrow QRS complex, and normal QTc. No T-wave or ST changes concerning for ischemia or infarct.  Imaging studies included a head CT notable for vasogenic edema in the left frontal lobe; this was further characterized on brain MRI and was concerning for metastatic disease and vasogenic edema.    The above results were explained to the patient with family members at the bedside. I spoke with Neurology (Dr. Rory Percy), who recommended starting the patient on 500mg  Keppra twice a day, loading the patient with 10mg  IV dexamethasone now,  and initiating 4 mg dexamethasone tid until she can see an Oncologist for follow-up.  The patient states she has a lot of family support in town. She had many questions  about the next steps in her care, which I answered to the best of my ability.  The patient was given prescriptions for IV Keppra and IV steroids as above.  Upon reassessment, she remained hemodynamically stable with no new symptoms or complaints.  I discussed the above results with the patient who verbalized understanding.  Prescription were provided for Keppra and steroids.  The patient has an appointment on Tuesday afternoon with Dr. Lisbeth Renshaw.  She was discharged in stable condition.  Final Clinical Impressions(s) / ED Diagnoses   Final diagnoses:  Vasogenic edema ([place])  Brain metastases ([place])   New Prescriptions Discharge Medication List as of 09/09/2017  3:24 PM    START taking these medications   Details  dexamethasone (DECADRON) 4 MG tablet Take 1 tablet (4 mg total) by mouth 3 (three) times daily., Starting Thu 09/09/2017, Until Sun 09/19/2017, Print    levETIRAcetam (KEPPRA) 500 MG tablet Take 1 tablet (500 mg total) by mouth 2 (two) times daily., Starting Thu 09/09/2017, Until Sat 10/09/2017, Print         Charisse March, MD 09/09/17 9407    Carmin Muskrat, MD 09/10/17 1701

## 2017-09-09 NOTE — H&P (View-Only) (Signed)
[address]       [place] [address]             [phone]       HPI: [name] returns to further discuss management of her right upper lobe nodule  [name] is a 71 year old woman with a past medical history significant for tobacco abuse, COPD, hypertension, hyperlipidemia, type 2 diabetes, arthritis, chronic neck and back pain, vertigo, hiatal hernia with reflux, and anxiety. She is admitted to the hospital in August with a cough, shortness of breath, and wheezing. She ruled out for an MI. She had a nuclear stress test that was low risk. She was treated with azithromycin for presumed bronchitis. Her symptoms improved but she continued to have some cough and wheezing. His partner workup showed a CT of the chest which showed a 2 cm cavitary right upper lobe mass concerning for primary bronchogenic carcinoma.  She followed up with Dr. Lamonte Sakai as an outpatient. A PET CT showed the nodule was hypermetabolic with an SUV of 15. There was a questionable small right paratracheal node.  I saw her in the office on September 19. She was still wheezing at that time. I recommended pulmonary function testing and also asked her to follow-up with Dr. Lamonte Sakai regarding her wheezing. He saw her the following week and started her on Spiriva once daily. She was readmitted to the hospital overnight with recurrent bronchitis last week. She was treated with antibiotics and her symptoms have improved.  Zubrod Score: At the time of surgery this patient's most appropriate activity status/level should be described as: []     0    Normal activity, no symptoms [x]     1    Restricted in physical strenuous activity but ambulatory, able to do out light work []     2    Ambulatory and capable of self care, unable to do work activities, up and about >50 % of waking hours                              []     3    Only limited self care, in bed greater than 50% of waking hours []     4    Completely disabled,  no self care, confined to bed or chair []     5    Moribund  Current Outpatient Prescriptions  Medication Sig Dispense Refill  . albuterol (PROVENTIL) (2.5 MG/3ML) 0.083% nebulizer solution USE 1 VIAL EVERY 6 HOURS AS NEEDED FOR SHORTNESS OF BREATH  0  . albuterol (VENTOLIN HFA) 108 (90 Base) MCG/ACT inhaler Inhale 2 puffs into the lungs every 6 (six) hours as needed for wheezing or shortness of breath.    . ALPRAZolam (XANAX) 0.25 MG tablet Take 0.25 mg by mouth daily as needed for anxiety.   2  . amLODipine (NORVASC) 5 MG tablet Take 5 mg by mouth 2 (two) times daily.     . cloNIDine (CATAPRES) 0.1 MG tablet Take 0.1 mg by mouth 2 (two) times daily as needed (high blood pressure).     . cyclobenzaprine (FLEXERIL) 10 MG tablet Take 10 mg by mouth at bedtime as needed for muscle spasms.    Marland Kitchen gabapentin (NEURONTIN) 100 MG capsule Take 100 mg by mouth daily as needed (for leg pain).   1  . hydrALAZINE (APRESOLINE) 50 MG tablet Take 50 mg by mouth 3 (three) times daily.     Marland Kitchen  JANUMET 50-500 MG tablet Take 1 tablet by mouth daily.  6  . KLOR-CON M10 10 MEQ tablet Take 1 tablet (10 mEq total) by mouth daily.    . metoprolol succinate (TOPROL-XL) 25 MG 24 hr tablet Take 25 mg by mouth daily.  1  . ondansetron (ZOFRAN ODT) 4 MG disintegrating tablet Take 1 tablet (4 mg total) by mouth every 8 (eight) hours as needed for nausea or vomiting. (Patient taking differently: Take 4 mg by mouth every 8 (eight) hours as needed for nausea or vomiting. DISSOLVE IN THE MOUTH) 20 tablet 0  . predniSONE (DELTASONE) 10 MG tablet Take 1 tablet (10 mg total) by mouth daily with breakfast. After 1 week, 1/2 tablet x 6 days. 10 tablet 1  . rosuvastatin (CRESTOR) 10 MG tablet Take 10 mg by mouth daily.     No current facility-administered medications for this visit.     Physical Exam BP (!) 174/79   Pulse 69   Ht 5\' 7"  (1.702 m)   Wt 223 lb (101.2 kg)   SpO2 98%   BMI 34.93 kg/m  Obese 71 year old woman in no  acute distress Alert and oriented 3 with no focal neurologic deficits No cervical or supraclavicular adenopathy Cardiac regular rate and rhythm with normal S1 and S2, no rubs murmurs or gallops Lungs clear bilaterally, no wheezing Abdomen soft and nontender No edema  Diagnostic Tests: Pulmonary function testing FVC 2.38 (88%) FEV1 1.74 (82%) FEV1 1.94 (92%) DLCO 20.94 (70%) NUCLEAR MEDICINE PET SKULL BASE TO THIGH  TECHNIQUE: 10.95 mCi F-18 FDG was injected intravenously. Full-ring PET imaging was performed from the skull base to thigh after the radiotracer. CT data was obtained and used for attenuation correction and anatomic localization.  FASTING BLOOD GLUCOSE:  Value: 140 mg/dl  COMPARISON:  None.  FINDINGS: NECK: 8 mm lymph node deep to the left parotid gland is identified with an SUV max equal to 2.77.  CHEST: No hypermetabolic axillary or supraclavicular lymph nodes.  Normal heart size. Aortic atherosclerosis. Calcifications in the LAD and RCA and left circumflex coronary artery is noted. Small right paratracheal lymph node measures 6 mm and has an SUV max equal to 3.3.  No pleural effusion. Cavitary nodule in the right upper lobe measures 2.5 cm and has an SU the max equal to 15.0. Mild changes of centrilobular emphysema identified. No additional hypermetabolic pulmonary nodules.  ABDOMEN/PELVIS: No abnormal hypermetabolic activity within the liver, pancreas, adrenal glands, or spleen. Aortic atherosclerosis identified. The infrarenal abdominal aorta measures 2.6 cm, image 132 of series 4. No hypermetabolic lymph nodes in the abdomen or pelvis.  SKELETON: No focal hypermetabolic activity to suggest skeletal metastasis.  IMPRESSION: 1. Intense FDG uptake associated with the cavitary nodule in the right upper lobe which is suspicious for primary bronchogenic carcinoma. Given the presence of internal cavitation this [place] represent a squamous cell  carcinoma. Correlation with tissue sampling advise. 2. No evidence for hypermetabolic adenopathy or distant metastatic disease. 3. Small, 8 mm cervical lymph node deep to the left parotid gland exhibits mild FDG uptake (SUV max equals 2.77). Nonspecific. 4.  Aortic Atherosclerosis (ICD10-I70.0). 5. Infrarenal abdominal aortic ectasia. Ectatic abdominal aorta at risk for aneurysm development. Recommend followup by ultrasound in 5 years. This recommendation follows ACR consensus guidelines: White Paper of the ACR Incidental Findings Committee II on Vascular Findings. J Am Coll Radiol 2013; 10:789-794.   Electronically Signed   By: Kerby Moors M.D.   On: 07/28/2017 12:30 I again personally  reviewed the CT and PET CT images and concur with the findings noted above  Impression: Mrs. [name] is a 70 year old woman was recently hospitalized with shortness of breath and wheezing. A CT of the chest revealed a 2 cm right upper lobe nodule that is highly suspicious for primary bronchogenic carcinoma. Other items in the differential included infectious or inflammatory nodules. Given her age, smoking history, and the appearance of the nodule is as 4 more likely to be lung cancer than anything else.  I discussed potential treatment options including surgical resection and radiation. I would favor proceeding directly to surgery with wedge resection be followed by lobectomy if the intraoperative frozen section was positive. I discussed the relative advantages and disadvantages of surgery versus radiation. She favors surgical resection.  We discussed the proposed operation in detail. I explained to her the need for general anesthesia, the incisions to be used, the use of a drainage tube postoperatively, the expected hospital stay, and the overall recovery. I informed her of the indications, risks, benefits, and alternatives. She understands the risks include, but are not limited to death, MI, DVT, PE,  stroke, bleeding, possible need for transfusion, infection, prolonged air leak, cardiac arrhythmias, as well as the possibility of other unforeseeable complications.  She accepts the risks and wishes to proceed.  Plan: Right VATS, wedge resection, possible right upper lobectomy on Thursday, 09/09/2017  Melrose Nakayama, MD [facility] [phone]

## 2017-09-09 NOTE — ED Notes (Signed)
Patient transported to MRI 

## 2017-09-09 NOTE — Anesthesia Preprocedure Evaluation (Signed)
Anesthesia Evaluation  Patient identified by MRN, date of birth, ID band Patient awake    Airway        Dental   Pulmonary shortness of breath, former smoker,           Cardiovascular hypertension,      Neuro/Psych  Neuromuscular disease    GI/Hepatic Neg liver ROS, hiatal hernia, GERD  ,  Endo/Other  diabetesMorbid obesity  Renal/GU negative Renal ROS     Musculoskeletal  (+) Arthritis ,   Abdominal   Peds  Hematology   Anesthesia Other Findings   Reproductive/Obstetrics                             Anesthesia Physical Anesthesia Plan  ASA: III  Anesthesia Plan: General   Post-op Pain Management:    Induction: Intravenous  PONV Risk Score and Plan: 3 and Ondansetron, Dexamethasone, Midazolam, Propofol infusion and Treatment [name] vary due to age or medical condition  Airway Management Planned: Double Lumen EBT  Additional Equipment:   Intra-op Plan:   Post-operative Plan: Extubation in OR  Informed Consent: I have reviewed the patients History and Physical, chart, labs and discussed the procedure including the risks, benefits and alternatives for the proposed anesthesia with the patient or authorized representative who has indicated his/her understanding and acceptance.   Dental advisory given  Plan Discussed with: CRNA  Anesthesia Plan Comments:         Anesthesia Quick Evaluation

## 2017-09-09 NOTE — Progress Notes (Signed)
Informed by Benjie Karvonen CRNA, that Dr. Sandria Senter is aware of the pt.'s report of heaviness on her R Side .

## 2017-09-09 NOTE — Anesthesia Procedure Notes (Addendum)
Central Venous Catheter Insertion Performed by: Rica Koyanagi, anesthesiologist Start/End10/18/2018 7:15 AM, 09/09/2017 7:30 AM Patient location: OR. Preanesthetic checklist: patient identified, IV checked, site marked, risks and benefits discussed, surgical consent, monitors and equipment checked, pre-op evaluation and timeout performed Position: Trendelenburg Lidocaine 1% used for infiltration and patient sedated Hand hygiene performed , maximum sterile barriers used  and Seldinger technique used Catheter size: 8 Fr Central line was placed.Double lumen Procedure performed using ultrasound guided technique. Ultrasound Notes:anatomy identified, needle tip was noted to be adjacent to the nerve/plexus identified and no ultrasound evidence of intravascular and/or intraneural injection Attempts: 1 Following insertion, line sutured, dressing applied and Biopatch. Post procedure assessment: blood return through all ports  Patient tolerated the procedure well with no immediate complications.

## 2017-09-09 NOTE — Anesthesia Procedure Notes (Signed)
Arterial Line Insertion Start/End10/18/2018 7:30 AM Performed by: Candis Shine, CRNA  Patient location: Pre-op. Preanesthetic checklist: patient identified, IV checked, site marked, risks and benefits discussed, surgical consent, monitors and equipment checked, pre-op evaluation, timeout performed and anesthesia consent Lidocaine 1% used for infiltration Left, radial was placed Catheter size: 20 G Hand hygiene performed  and maximum sterile barriers used   Attempts: 1 Procedure performed without using ultrasound guided technique. Following insertion, dressing applied. Post procedure assessment: normal and unchanged  Patient tolerated the procedure well with no immediate complications.

## 2017-09-09 NOTE — Progress Notes (Signed)
Pt. Arrived this a.m. For surgery & reports that she has a new sensation this a.m. reporting that she feels the R side- arm & leg feel heavy today. Pt. Able to have a symmetrical smile, converses without any hesitation or gaps in expression.  BP stable, pulse wnl. CBG- 140+ Pt. Reports that she was able to stand to take a shower this a.m.

## 2017-09-09 NOTE — Telephone Encounter (Signed)
Referral to [facility] for pt with new brain mets .Today pt was in for VATS procedure ( RUL lung nodule) and  started complaining of arm and leg weakness. Sent to ED -has brain mets. Gudena referred to XRT and to [facility]. Per [facility] appt in 1-2 weeks.Schedule message sent.

## 2017-09-09 NOTE — Telephone Encounter (Signed)
Appt has been scheduled for the pt to see Dr. Lindi Adie on 11/1 at 1pm. Letter and directions mailed to the pt.

## 2017-09-09 NOTE — ED Notes (Addendum)
Pt given crackers and decaf coffee with EDP auth. Diabetic tray also ordered.

## 2017-09-09 NOTE — Discharge Instructions (Signed)
Please be sure to follow up with Dr. Doylene Canard, as well as our cancer team on Tuesday.

## 2017-09-09 NOTE — Interval H&P Note (Signed)
History and Physical Interval Note:  09/09/2017 7:58 AM  [name]  has presented today for surgery, with the diagnosis of Right Upper Lobe Lung Nodule  The various methods of treatment have been discussed with the patient and family. After consideration of risks, benefits and other options for treatment, the patient has consented to  Procedure(s): VIDEO ASSISTED THORACOSCOPY (VATS)/Right Upper LOBECTOMY (Right) as a surgical intervention .  The patient's history has been reviewed, patient examined, no change in status, stable for surgery.  I have reviewed the patient's chart and labs.  Questions were answered to the patient's satisfaction.     Melrose Nakayama

## 2017-09-09 NOTE — Interval H&P Note (Signed)
History and Physical Interval Note: After I had talked to [name] and was leaving the room, I was notified that [name] complained of heaviness in right arm and leg that started yesterday. She had some trouble lifting her leg and her husband had to help her with it. She was able to stand this morning for a shower. She says the heaviness is better but still present.  She is alert and oriented. Has pretty good strength bilaterally although slightly weaker on right.  I do not think it is advisable to do elective surgery under these circumstances. She needs a formal neuro evaluation. Will cancel surgery and send to ED for workup.  Can reschedule surgery once cleared from a neuro perspective 09/09/2017 8:08 AM  [name]  has presented today for surgery, with the diagnosis of Right Upper Lobe Lung Nodule  The various methods of treatment have been discussed with the patient and family. After consideration of risks, benefits and other options for treatment, the patient has consented to  Procedure(s): VIDEO ASSISTED THORACOSCOPY (VATS)/Right Upper LOBECTOMY (Right) as a surgical intervention .  The patient's history has been reviewed, patient examined, no change in status, stable for surgery.  I have reviewed the patient's chart and labs.  Questions were answered to the patient's satisfaction.     Melrose Nakayama

## 2017-09-09 NOTE — ED Notes (Signed)
Patient transported to CT 

## 2017-09-10 ENCOUNTER — Encounter: Payer: Self-pay | Admitting: Radiation Oncology

## 2017-09-10 ENCOUNTER — Other Ambulatory Visit: Payer: Self-pay | Admitting: *Deleted

## 2017-09-10 ENCOUNTER — Encounter: Payer: Self-pay | Admitting: *Deleted

## 2017-09-10 DIAGNOSIS — R918 Other nonspecific abnormal finding of lung field: Secondary | ICD-10-CM

## 2017-09-10 NOTE — Progress Notes (Signed)
Location/Histology of Brain Tumor:  FINDINGS:MRI Brain 09/09/17: Brain: Solidly enhancing mass in the parasagittal posterior left frontal lobe measuring 10 mm. There is moderate surrounding vasogenic edema. No second mass is seen. No infarct, hemorrhage, or hydrocephalus. Mild chronic microvascular ischemic type FLAIR hyperintensity in the cerebral white matter and pons.  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 Julien Nordmann  Dose of Decadron, if applicable: 4mg  oral 3x day started 09/09/17  Recent neurologic symptoms, if any:   Seizures:  Headaches:   Nausea:   Dizziness/ataxia:   Difficulty with hand coordination:   Focal numbness/weakness: yes  Visual deficits/changes:   Confusion/Memory deficits:  Painful bone metastases at present, if any:  SAFETY ISSUES:  Prior radiation? NO  Pacemaker/ICD? NO  Is the patient on methotrexate?    ED Notes from        Additional Complaints / other details: The patient is a 71 year old female with a medical history significant for COPD, diabetes, hypertension, GERD, anxiety, and vertigo, who presents to the ED complaining of weakness.  The patient presented to the hospital today for a VATS and possible right upper lobectomy for a lung nodule. Upon review of systems prior to her case, she informed the CT surgeon that she had been experiencing a sense of right upper and right lower extremity heaviness that began yesterday when she awoke.  She was sent to the ED for further evaluation.  At this time, she continues to complain of subjective  heaviness in her right limbs.  She also reports numbness/tingling in her right lower extremity that is worse when she is standing. She denies headaches, vision changes, or other weakness. She denies pain at this time.

## 2017-09-10 NOTE — Progress Notes (Signed)
Location/Histology of Brain Tumor:  FINDINGS:MRI Brain 09/09/17: Brain: Solidly enhancing mass in the parasagittal posterior left frontal lobe measuring 10 mm. There is moderate surrounding vasogenic edema. No second mass is seen. No infarct, hemorrhage, or hydrocephalus. Mild chronic microvascular ischemic type FLAIR hyperintensity in the cerebral white matter and pons.  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 Sent to ED -has brain mets.   Past or anticipated interventions, if any, per neurosurgery:   Past or anticipated interventions, if any, per medical oncology: Dr. Julien Nordmann  Dose of Decadron, if applicable: 4mg  oral 3x day started 09/09/17  Recent neurologic symptoms, if any:   Seizures: Patient states that she has not had any seizures ,but she is on Keppra  Headaches: Patient states that she has headaches ,States that she does not take any thing for relief they just leave.  Nausea: Denies any nausea  Dizziness/ataxia: States that she got dizzy last week.   Difficulty with hand coordination: Patient states that she has some issues with picking up object with her right hand.  Focal numbness/weakness: Patient states that she is having some numbness in her right arm and leg.  Visual deficits/changes: Patient states that she is seeing spot.  Confusion/Memory deficits: Denies any memory issues.  Painful bone metastases at present, if MOQ:HUTMLY having any pain ,just numbness  SAFETY ISSUES:  Prior radiation? NO  Pacemaker/ICD? NO  Is the patient on methotrexate? NO   ED Notes from        Additional Complaints /  other details: The patient is a 71 year old female with a medical history significant for COPD, diabetes, hypertension, GERD, anxiety, and vertigo, who presents to the ED complaining of weakness.  The patient presented to the hospital today for a VATS and possible right upper lobectomy for a lung nodule. Upon review of systems prior to her case, she informed the CT surgeon that she had been experiencing a sense of right upper and right lower extremity heaviness that began yesterday when she awoke.  She was sent to the ED for further evaluation.  At this time, she continues to complain of subjective heaviness in her right limbs.  She also reports numbness/tingling in her right lower extremity that is worse when she is standing. She denies headaches, vision changes, or other weakness. She denies pain at this time.  09/09/2017  Bun 13   Creatinine 1.06  GFR Cal Non Af Amer 52 Vitals:   09/14/17 1404  BP: (!) 149/68  Pulse: 66  Resp: 20  Temp: 98.3 F (36.8 C)  TempSrc: Oral  SpO2: 98%  Weight: 225 lb (102.1 kg)   Wt Readings from Last 3 Encounters:  09/14/17 225 lb (102.1 kg)  09/14/17 223 lb (101.2 kg)  09/09/17 223 lb (101.2 kg)

## 2017-09-14 ENCOUNTER — Ambulatory Visit
Admission: RE | Admit: 2017-09-14 | Discharge: 2017-09-14 | Disposition: A | Discharge status: Home / Self Care | Payer: Medicare HMO | Source: Ambulatory Visit | Attending: Radiation Oncology | Admitting: Radiation Oncology

## 2017-09-14 ENCOUNTER — Encounter: Payer: Self-pay | Admitting: Radiation Oncology

## 2017-09-14 ENCOUNTER — Other Ambulatory Visit: Payer: Self-pay | Admitting: *Deleted

## 2017-09-14 ENCOUNTER — Ambulatory Visit (INDEPENDENT_AMBULATORY_CARE_PROVIDER_SITE_OTHER): Payer: Medicare HMO | Admitting: Thoracic Surgery (Cardiothoracic Vascular Surgery)

## 2017-09-14 ENCOUNTER — Inpatient Hospital Stay
Admission: RE | Admit: 2017-09-14 | Discharge: 2017-09-14 | Disposition: A | Discharge status: Home / Self Care | Payer: Medicare HMO | Source: Ambulatory Visit | Attending: Radiation Oncology | Admitting: Radiation Oncology

## 2017-09-14 VITALS — BP 142/77 | HR 74 | Resp 16 | Ht 67.0 in | Wt 223.0 lb

## 2017-09-14 VITALS — BP 149/68 | HR 66 | Temp 98.3°F | Resp 20 | Wt 225.0 lb

## 2017-09-14 DIAGNOSIS — G9389 Other specified disorders of brain: Secondary | ICD-10-CM

## 2017-09-14 DIAGNOSIS — C711 Malignant neoplasm of frontal lobe: Secondary | ICD-10-CM

## 2017-09-14 DIAGNOSIS — G939 Disorder of brain, unspecified: Secondary | ICD-10-CM | POA: Diagnosis not present

## 2017-09-14 DIAGNOSIS — J984 Other disorders of lung: Secondary | ICD-10-CM

## 2017-09-14 DIAGNOSIS — R918 Other nonspecific abnormal finding of lung field: Secondary | ICD-10-CM | POA: Insufficient documentation

## 2017-09-14 MED ORDER — DEXAMETHASONE 4 MG PO TABS: 4.0000 mg | ORAL_TABLET | Freq: Three times a day (TID) | ORAL | 0 refills | Status: AC

## 2017-09-14 NOTE — Progress Notes (Signed)
[address]       [place] [address]             [phone]     HPI: Mrs. Friesenhahn returns to discuss treatment for right upper lobe mass  Mrs. Roettger is a 71 yo woman with a history of tobacco abuse and COPD. She was admitted to the hospital in August with cough, shortness of breath, and wheezing.She was treated for presumed bronchitis but had persistent cough and wheezing. A CT of the chest showed a 2 cm cavitary right upper lobe mass. This was hypermetabolic on PET CT with an SUV of 15.  Our initial plan was to proceed directly to surgical resection with a wedge resection be followed by lobectomy if positive. She presented for surgery last week and complained of some right-sided weakness. We cancel the operation and an MR of the brain showed a metastasis.  She was started on Decadron. She still has some weakness on the right side.  Past Medical History:  Diagnosis Date  . Anxiety   . Arthritis   . Chronic back pain greater than 3 months duration   . Chronic neck pain   . COPD (chronic obstructive pulmonary disease) ([place])   . Cough   . Diabetes mellitus   . Dyspnea   . GERD (gastroesophageal reflux disease)   . H/O hiatal hernia   . Headache(784.0)   . Hemorrhoids   . Herniated disc   . History of kidney stones   . Hx of echocardiogram 2010   normal EF  . Hypertension   . Palpitations   . Shortness of breath on exertion 11/10/11   "sometimes"  . Vertigo   . Vertigo     Current Outpatient Prescriptions  Medication Sig Dispense Refill  . albuterol (PROVENTIL) (2.5 MG/3ML) 0.083% nebulizer solution USE 1 VIAL EVERY 6 HOURS AS NEEDED FOR SHORTNESS OF BREATH  0  . albuterol (VENTOLIN HFA) 108 (90 Base) MCG/ACT inhaler Inhale 2 puffs into the lungs every 6 (six) hours as needed for wheezing or shortness of breath.    . ALPRAZolam (XANAX) 0.25 MG tablet Take 0.25 mg by mouth daily as needed for anxiety.   2  . amLODipine (NORVASC) 5 MG tablet Take 5 mg  by mouth 2 (two) times daily.     . cholecalciferol (VITAMIN D) 1000 units tablet Take 1,000 Units by mouth daily.    . cloNIDine (CATAPRES) 0.1 MG tablet Take 0.1 mg by mouth 2 (two) times daily as needed (high blood pressure).     . conjugated estrogens (PREMARIN) vaginal cream Place 0.5 Applicatorfuls vaginally at bedtime as needed.    . cyclobenzaprine (FLEXERIL) 10 MG tablet Take 10 mg by mouth at bedtime as needed for muscle spasms.    Marland Kitchen dexamethasone (DECADRON) 4 MG tablet Take 1 tablet (4 mg total) by mouth 3 (three) times daily. 30 tablet 0  . gabapentin (NEURONTIN) 100 MG capsule Take 100 mg by mouth daily as needed (for leg pain).   1  . hydrALAZINE (APRESOLINE) 50 MG tablet Take 50 mg by mouth 3 (three) times daily.     Marland Kitchen JANUMET 50-500 MG tablet Take 1 tablet by mouth daily.  6  . KLOR-CON M10 10 MEQ tablet Take 1 tablet (10 mEq total) by mouth daily. (Patient taking differently: Take 10 mEq by mouth 2 (two) times daily. )    . levETIRAcetam (KEPPRA) 500 MG tablet Take 1 tablet (500 mg total)  by mouth 2 (two) times daily. 60 tablet 0  . loratadine (CLARITIN) 10 MG tablet Take 10 mg by mouth daily as needed for allergies.    . metoprolol succinate (TOPROL-XL) 25 MG 24 hr tablet Take 25 mg by mouth daily.  1  . ondansetron (ZOFRAN ODT) 4 MG disintegrating tablet Take 1 tablet (4 mg total) by mouth every 8 (eight) hours as needed for nausea or vomiting. (Patient taking differently: Take 4 mg by mouth every 8 (eight) hours as needed for nausea or vomiting. DISSOLVE IN THE MOUTH) 20 tablet 0  . rosuvastatin (CRESTOR) 10 MG tablet Take 10 mg by mouth daily.     No current facility-administered medications for this visit.    Facility-Administered Medications Ordered in Other Visits  Medication Dose Route Frequency Provider Last Rate Last Dose  . fentaNYL (SUBLIMAZE) injection    Anesthesia Intra-op Candis Shine, CRNA   50 mcg at 09/09/17 0718  . lactated ringers infusion    Continuous  PRN Harder, Rebeca Alert, CRNA      . lactated ringers infusion    Continuous PRN Harder, Rebeca Alert, CRNA      . midazolam (VERSED) 5 MG/5ML injection    Anesthesia Intra-op Candis Shine, CRNA   1 mg at 09/09/17 4008    Physical Exam BP (!) 142/77 (BP Location: Right Arm, Patient Position: Standing, Cuff Size: Large)   Pulse 74   Resp 16   Ht 5\' 7"  (1.702 m)   Wt 223 lb (101.2 kg)   SpO2 98% Comment: ON RA  BMI 34.37 kg/m  71 year old woman in no acute distress Mildly lethargic, flat affect, walks with shuffling gait Lungs clear  Diagnostic Tests: MRI HEAD WITHOUT AND WITH CONTRAST  TECHNIQUE: Multiplanar, multiecho pulse sequences of the brain and surrounding structures were obtained without and with intravenous contrast.  CONTRAST:  56mL MULTIHANCE GADOBENATE DIMEGLUMINE 529 MG/ML IV SOLN  COMPARISON:  Head CT from earlier today  FINDINGS: Brain: Solidly enhancing mass in the parasagittal posterior left frontal lobe measuring 10 mm. There is moderate surrounding vasogenic edema. No second mass is seen. No infarct, hemorrhage, or hydrocephalus. Mild chronic microvascular ischemic type FLAIR hyperintensity in the cerebral white matter and pons.  Vascular: Major flow voids are preserved  Skull and upper cervical spine: Negative for marrow lesion  Sinuses/Orbits: Negative  IMPRESSION: 10 mm left posterior frontal mass with moderate vasogenic edema, likely a solitary metastasis in this clinical setting.   Electronically Signed   By: Monte Fantasia M.D.   On: 09/09/2017 12:15 I personally reviewed the MR images and concur with the findings noted above  Impression: Mrs. Furney is a 71 year old woman with history of tobacco abuse and COPD has a right upper lobe mass with a solitary brain metastasis. 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  The 2 potential options for biopsy. The first is a navigational  bronchoscopy.that could be combined with endobronchial ultrasound for more definitive staging. We would need a new CT with super D protocol to do that procedure. The other option would be a CT-guided biopsy. I would not give [place] any information regarding the hilar or mediastinal nodes. But if that could be done quicker at this point it would be the best option.  She has an appointment with radiation oncology this afternoon  Plan: CT-guided needle biopsy right upper lobe mass.- Urgent due to symptomatic brain metastasis  Melrose Nakayama, MD  [facility] [phone]

## 2017-09-15 ENCOUNTER — Other Ambulatory Visit: Payer: Self-pay | Admitting: *Deleted

## 2017-09-16 ENCOUNTER — Other Ambulatory Visit: Payer: Medicare HMO

## 2017-09-16 ENCOUNTER — Telehealth: Payer: Self-pay | Admitting: *Deleted

## 2017-09-16 ENCOUNTER — Encounter: Payer: Self-pay | Admitting: Radiation Oncology

## 2017-09-16 NOTE — Telephone Encounter (Signed)
Left vm , not sure e-scribed rx was at [facility], called and left vm, decadron 4mg  1 tablet 3x day,dispense 30 tabs, no refills per Dr. Lisbeth Renshaw, please call for any questions 8:14 AM

## 2017-09-16 NOTE — Progress Notes (Signed)
Has armband been applied?  No Does patient have an allergy to IV contrast dye?: NO   Has patient ever received premedication for IV contrast dye?:  NO  Does patient take metformin?: takes janumet ,  If patient does take metformin when was the last dose: janumet last dose on 09/26/17  Date of lab work: 09/09/17 BUN: 13 CR: 1.06, EFGR=>60  IV site: unable to get IV started, ok per MRI 11/2/218, okay no IV per MD  Has IV site been added to flowsheet?  NO

## 2017-09-16 NOTE — Progress Notes (Signed)
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   HISTORY OF PRESENT ILLNESS: [name] is a 71 y.o. female seen at the request of Dr. 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 The patient has been followed by Dr. Lamonte Sakai in pulmonary medicine and was recently noted to have a pulmonary cavitary mass measuring 2 cm in the RUL and this was hypermetabolic on her PET scan on 07/28/17. She was cleared for surgery and last Thursday she was in the PACU ready for resection when she mentioned that she was having a hard time picking up her right leg. She was sent to the ED and her case was cancelled. She had a CT without contrast of the brain as well as MRI brain which revealed a solitary enhancing left frontal lobe lesion measuring 10 mm with moderate vasogenic edema. She comes today to discuss options of radiotherapy to the brain and is being set up for CT guided biopsy of the lung for diagnosis.    PREVIOUS RADIATION THERAPY: No   PAST MEDICAL HISTORY:  Past Medical History:  Diagnosis Date  . Anxiety   . Arthritis   . Chronic back pain greater than 3 months duration   . Chronic neck pain   . COPD (chronic obstructive pulmonary disease) ([place])   . Cough   . Diabetes mellitus   . Dyspnea   . GERD (gastroesophageal reflux disease)   . H/O hiatal hernia   . Headache(784.0)   . Hemorrhoids   . Herniated disc   . History of kidney stones   . Hx of echocardiogram 2010   normal EF  . Hypertension   . Palpitations   . Shortness of breath on exertion 11/10/11   "sometimes"  . Vertigo   . Vertigo        PAST SURGICAL HISTORY: Past Surgical History:  Procedure Laterality Date  . ABDOMINAL  HYSTERECTOMY  1997?   "partial"  . ANTERIOR CERVICAL DECOMP/DISCECTOMY FUSION N/A 12/25/2016   Procedure: ANTERIOR CERVICAL DECOMPRESSION/DISCECTOMY FUSION CERVICAL FOUR CERVICAL FIVE;  Surgeon: Ashok Pall, MD;  Location: [place];  Service: Neurosurgery;  Laterality: N/A;  . CHOLECYSTECTOMY  ~ 2008  . COLONOSCOPY    . EYE SURGERY     cateracts  . FRACTURE SURGERY  2004?   right shoulder  . KNEE ARTHROSCOPY  2004   right  . KNEE SURGERY     right  . neck fusion    . SHOULDER OPEN ROTATOR CUFF REPAIR  07/20/2011   left  . TONSILLECTOMY       FAMILY HISTORY:  Family History  Problem Relation Age of Onset  . Lung cancer Sister      SOCIAL HISTORY:  reports that she quit smoking about 2 months ago. Her smoking use included Cigarettes. She has a 7.50 pack-year smoking history. She has never used smokeless tobacco. She reports that she does not drink alcohol or use drugs. The patient is married and accompanied also by her daughter.    ALLERGIES: Oxycodone   MEDICATIONS:  Current Outpatient Prescriptions  Medication Sig Dispense Refill  . albuterol (PROVENTIL) (2.5 MG/3ML) 0.083% nebulizer solution USE 1 VIAL  EVERY 6 HOURS AS NEEDED FOR SHORTNESS OF BREATH  0  . albuterol (VENTOLIN HFA) 108 (90 Base) MCG/ACT inhaler Inhale 2 puffs into the lungs every 6 (six) hours as needed for wheezing or shortness of breath.    . ALPRAZolam (XANAX) 0.25 MG tablet Take 0.25 mg by mouth daily as needed for anxiety.   2  . amLODipine (NORVASC) 5 MG tablet Take 5 mg by mouth 2 (two) times daily.     . cholecalciferol (VITAMIN D) 1000 units tablet Take 1,000 Units by mouth daily.    . cloNIDine (CATAPRES) 0.1 MG tablet Take 0.1 mg by mouth 2 (two) times daily as needed (high blood pressure).     . conjugated estrogens (PREMARIN) vaginal cream Place 0.5 Applicatorfuls vaginally at bedtime as needed.    . cyclobenzaprine (FLEXERIL) 10 MG tablet Take 10 mg by mouth at bedtime as needed for muscle spasms.     Marland Kitchen dexamethasone (DECADRON) 4 MG tablet Take 1 tablet (4 mg total) by mouth 3 (three) times daily. 30 tablet 0  . gabapentin (NEURONTIN) 100 MG capsule Take 100 mg by mouth daily as needed (for leg pain).   1  . hydrALAZINE (APRESOLINE) 50 MG tablet Take 50 mg by mouth 3 (three) times daily.     Marland Kitchen JANUMET 50-500 MG tablet Take 1 tablet by mouth daily.  6  . KLOR-CON M10 10 MEQ tablet Take 1 tablet (10 mEq total) by mouth daily. (Patient taking differently: Take 10 mEq by mouth 2 (two) times daily. )    . levETIRAcetam (KEPPRA) 500 MG tablet Take 1 tablet (500 mg total) by mouth 2 (two) times daily. 60 tablet 0  . loratadine (CLARITIN) 10 MG tablet Take 10 mg by mouth daily as needed for allergies.    . metoprolol succinate (TOPROL-XL) 25 MG 24 hr tablet Take 25 mg by mouth daily.  1  . ondansetron (ZOFRAN ODT) 4 MG disintegrating tablet Take 1 tablet (4 mg total) by mouth every 8 (eight) hours as needed for nausea or vomiting. (Patient taking differently: Take 4 mg by mouth every 8 (eight) hours as needed for nausea or vomiting. DISSOLVE IN THE MOUTH) 20 tablet 0  . ONETOUCH VERIO test strip     . rosuvastatin (CRESTOR) 10 MG tablet Take 10 mg by mouth daily.     No current facility-administered medications for this encounter.    Facility-Administered Medications Ordered in Other Encounters  Medication Dose Route Frequency Provider Last Rate Last Dose  . fentaNYL (SUBLIMAZE) injection    Anesthesia Intra-op Candis Shine, CRNA   50 mcg at 09/09/17 0718  . lactated ringers infusion    Continuous PRN Harder, Rebeca Alert, CRNA      . lactated ringers infusion    Continuous PRN Harder, Rebeca Alert, CRNA      . midazolam (VERSED) 5 MG/5ML injection    Anesthesia Intra-op Candis Shine, CRNA   1 mg at 09/09/17 2376     REVIEW OF SYSTEMS: On review of systems, the patient reports that she is doing well overall since starting steroids. She has been on 4 mg of Dexamethasone TID. She reports more  control of her RLE and RUE. She denies any chest pain, shortness of breath, cough, fevers, chills, night sweats, unintended weight changes. She denies any bowel or bladder disturbances, and denies abdominal pain, nausea or vomiting. She denies any new musculoskeletal or joint aches or pains. A complete review of systems is obtained and is  otherwise negative.     PHYSICAL EXAM:  Wt Readings from Last 3 Encounters:  09/14/17 225 lb (102.1 kg)  09/14/17 223 lb (101.2 kg)  09/09/17 223 lb (101.2 kg)   Temp Readings from Last 3 Encounters:  09/14/17 98.3 F (36.8 C) (Oral)  09/09/17 (!) 97.4 F (36.3 C)  09/06/17 98.3 F (36.8 C)   BP Readings from Last 3 Encounters:  09/14/17 (!) 149/68  09/14/17 (!) 142/77  09/09/17 136/81   Pulse Readings from Last 3 Encounters:  09/14/17 66  09/14/17 74  09/09/17 68   Pain Assessment Pain Score: 0-No pain/10  In general this is a well appearing African American female in no acute distress. She is alert and oriented x4 and appropriate throughout the examination. HEENT reveals that the patient is normocephalic, atraumatic. EOMs are intact. PERRLA. Skin is intact without any evidence of gross lesions. Cardiovascular exam reveals a regular rate and rhythm, no clicks rubs or murmurs are auscultated. Chest is clear to auscultation bilaterally. Lymphatic assessment is performed and does not reveal any adenopathy in the cervical, supraclavicular, axillary, or inguinal chains. Abdomen has active bowel sounds in all quadrants and is intact. The abdomen is soft, non tender, non distended. Lower extremities are negative for pretibial pitting edema, deep calf tenderness, cyanosis or clubbing.   ECOG = 1  0 - Asymptomatic (Fully active, able to carry on all predisease activities without restriction)  1 - Symptomatic but completely ambulatory (Restricted in physically strenuous activity but ambulatory and able to carry out work of a light or sedentary nature.  For example, light housework, office work)  2 - Symptomatic, <50% in bed during the day (Ambulatory and capable of all self care but unable to carry out any work activities. Up and about more than 50% of waking hours)  3 - Symptomatic, >50% in bed, but not bedbound (Capable of only limited self-care, confined to bed or chair 50% or more of waking hours)  4 - Bedbound (Completely disabled. Cannot carry on any self-care. Totally confined to bed or chair)  5 - Death   Eustace Pen MM, Creech RH, Tormey DC, et al. [phone]). "Toxicity and response criteria of the [facility]". [place] Oncol. 5 (6): 649-55    LABORATORY DATA:  Lab Results  Component Value Date   WBC 7.9 09/09/2017   HGB 12.9 09/09/2017   HCT 38.2 09/09/2017   MCV 89.0 09/09/2017   PLT 303 09/09/2017   Lab Results  Component Value Date   NA 135 09/09/2017   K 4.2 09/09/2017   CL 102 09/09/2017   CO2 25 09/09/2017   Lab Results  Component Value Date   ALT 9 (L) 09/09/2017   AST 15 09/09/2017   ALKPHOS 124 09/09/2017   BILITOT 0.5 09/09/2017      RADIOGRAPHY: Dg Chest 2 View  Result Date: 09/06/2017 CLINICAL DATA:  71 y/o  F; 09/09/2017 right upper lobectomy. EXAM: CHEST  2 VIEW COMPARISON:  08/24/2017 chest radiograph. FINDINGS: Stable normal cardiac silhouette. Aortic atherosclerosis with calcification. Stable right upper lobe pulmonary nodule. No new consolidation. No pleural effusion or pneumothorax. No acute osseous abnormality is evident. IMPRESSION: Stable right upper lobe pulmonary nodule.  No new consolidation. Electronically Signed   By: Kristine Garbe M.D.   On: 09/06/2017 17:09   X-ray Chest Pa And Lateral  Result Date: 08/24/2017 CLINICAL DATA:  Shortness of breath and cough 2 weeks. EXAM: CHEST  2 VIEW COMPARISON:  07/05/2017 and PET-CT  07/28/2017 FINDINGS: Lungs are adequately inflated without focal consolidation or effusion. Stable right apical cavitary nodule known to  be intensely FDG avid. Cardiomediastinal silhouette is within normal. There is minimal calcified plaque over the aortic arch. Minimal degenerate change of the spine. IMPRESSION: No acute cardiopulmonary disease. Stable known intensely FDG avid right apical cavitary nodule suspicious for primary bronchogenic carcinoma. Electronically Signed   By: Marin Olp M.D.   On: 08/24/2017 01:33   Ct Head Wo Contrast  Result Date: 09/09/2017 CLINICAL DATA:  Sensation of right arm and leg heaviness and numbness which began last night. History of lung carcinoma. EXAM: CT HEAD WITHOUT CONTRAST TECHNIQUE: Contiguous axial images were obtained from the base of the skull through the vertex without intravenous contrast. COMPARISON:  Head CT scan 05/27/2017.  Brain MRI 03/13/2015. FINDINGS: Brain: Partially empty sella is noted. There is a new large focus of hypoattenuation high left frontal lobe with sparing of the gray matter most consistent with vasogenic edema and presence of a mass lesion. No evidence of acute infarct, hemorrhage, hydrocephalus, pneumocephalus or midline shift. Vascular: Atherosclerosis noted. Skull: Intact.  No focal lesion. Sinuses/Orbits: Minimal ethmoid air cell disease is noted. Other: Negative. IMPRESSION: Large focus of vasogenic edema in the high left frontal lobe highly suspicious for metastatic disease in this patient with a history of lung carcinoma. Brain MRI with and without contrast is recommended for further evaluation. These results were called by telephone at the time of interpretation on 09/09/2017 at 10:04 am to Dr. Charisse March , who verbally acknowledged these results. Electronically Signed   By: Inge Rise M.D.   On: 09/09/2017 10:07   Mr Brain W And Wo Contrast  Result Date: 09/09/2017 CLINICAL DATA:  Right-sided weakness and heaviness, onset yesterday. Concerning pulmonary nodule on PET-CT. Patient was to have resection today. EXAM: MRI HEAD WITHOUT AND WITH CONTRAST  TECHNIQUE: Multiplanar, multiecho pulse sequences of the brain and surrounding structures were obtained without and with intravenous contrast. CONTRAST:  62mL MULTIHANCE GADOBENATE DIMEGLUMINE 529 MG/ML IV SOLN COMPARISON:  Head CT from earlier today FINDINGS: Brain: Solidly enhancing mass in the parasagittal posterior left frontal lobe measuring 10 mm. There is moderate surrounding vasogenic edema. No second mass is seen. No infarct, hemorrhage, or hydrocephalus. Mild chronic microvascular ischemic type FLAIR hyperintensity in the cerebral white matter and pons. Vascular: Major flow voids are preserved Skull and upper cervical spine: Negative for marrow lesion Sinuses/Orbits: Negative IMPRESSION: 10 mm left posterior frontal mass with moderate vasogenic edema, likely a solitary metastasis in this clinical setting. Electronically Signed   By: Monte Fantasia M.D.   On: 09/09/2017 12:15       IMPRESSION/PLAN: 1. 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 Dr. Lisbeth Renshaw discusses the radiographic findings and reviews the nature of what we believe to be Stage IV Lung cancer. We discussed the recommendations to move forward with tissue confirmation of this and she is being scheduled for needle biopsy of the lung. She has been set up as well to meet with medical oncology and her tumor will be tested for molecular studies as well. We discussed the treatment recommendations for patients who have solitary brain metastases that are small can undergo stereotactic radiosurgery and we discussed the differences of this therapy compared to whole brain treatment. We discussed that her pathology also becomes a qualifying factor as well as the findings from a 3T MRI which we recommend. She is in agreement and would like to pursue this.  We discussed the risks, benefits, short, and long term effects of radiotherapy. Dr. Lisbeth Renshaw discusses the delivery and logistics of radiotherapy. She will proceed with simulation on  09/21/17 once her tissue has been confirmed and consent will be reviewed at that time. She will continue steroids at her current dose and will be given taper instructions on the day of her treatment.  The above documentation reflects my direct findings during this shared patient visit. Please see the separate note by Dr. Lisbeth Renshaw on this date for the remainder of the patient's plan of care.    Carola Rhine, PAC

## 2017-09-20 ENCOUNTER — Telehealth: Payer: Self-pay | Admitting: Medical Oncology

## 2017-09-20 NOTE — Telephone Encounter (Signed)
Returned pt call. She thinks her bx is supposed to be Thursday.Pt aware of bx appt.

## 2017-09-21 ENCOUNTER — Ambulatory Visit: Payer: Medicare HMO | Admitting: Radiation Oncology

## 2017-09-21 ENCOUNTER — Telehealth: Payer: Self-pay | Admitting: *Deleted

## 2017-09-21 ENCOUNTER — Inpatient Hospital Stay
Admit: 2017-09-21 | Discharge: 2017-09-21 | Disposition: A | Discharge status: Home / Self Care | Payer: Self-pay | Attending: Radiation Oncology | Admitting: Radiation Oncology

## 2017-09-21 NOTE — Telephone Encounter (Signed)
Oncology Nurse Navigator Documentation  Oncology Nurse Navigator Flowsheets 09/21/2017  Navigator Location [facility]  Referral date to RadOnc/MedOnc 09/21/2017  Navigator Encounter Type Telephone/NP updated me that Rad Onc needed to change patient's appt with her due to her [facility] treatment. I called patient. I left vm message for her to call me with my name and phone number.   Telephone Outgoing Call  Treatment Phase Pre-Tx/Tx Discussion  Barriers/Navigation Needs Coordination of Care  Interventions Coordination of Care  Coordination of Care Appts  Acuity Level 2  Acuity Level 2 Assistance expediting appointments  Time Spent with Patient 30

## 2017-09-22 ENCOUNTER — Inpatient Hospital Stay: Admit: 2017-09-22 | Payer: Medicare HMO

## 2017-09-22 ENCOUNTER — Other Ambulatory Visit: Payer: Self-pay | Admitting: Radiology

## 2017-09-22 ENCOUNTER — Telehealth: Payer: Self-pay | Admitting: *Deleted

## 2017-09-22 DIAGNOSIS — R918 Other nonspecific abnormal finding of lung field: Secondary | ICD-10-CM

## 2017-09-22 NOTE — Telephone Encounter (Signed)
Oncology Nurse Navigator Documentation  Oncology Nurse Navigator Flowsheets 09/22/2017  Navigator Location [facility]  Navigator Encounter Type Telephone/I called to follow up with Ms. Shedlock.  I gave her the appt for Med Onc on 09/28/17. She verbalized understanding of appt time and place.   Telephone Outgoing Call  Treatment Phase Pre-Tx/Tx Discussion  Barriers/Navigation Needs Coordination of Care;Education  Education Other  Interventions Coordination of Care;Education  Coordination of Care Appts  Education Method Verbal  Acuity Level 1  Time Spent with Patient 15

## 2017-09-23 ENCOUNTER — Other Ambulatory Visit: Payer: Self-pay | Admitting: Student

## 2017-09-23 ENCOUNTER — Ambulatory Visit: Payer: Medicare HMO | Admitting: Oncology

## 2017-09-23 ENCOUNTER — Ambulatory Visit
Admission: RE | Admit: 2017-09-23 | Discharge: 2017-09-23 | Disposition: A | Discharge status: Home / Self Care | Payer: Medicare HMO | Source: Ambulatory Visit | Attending: Radiation Oncology | Admitting: Radiation Oncology

## 2017-09-23 ENCOUNTER — Other Ambulatory Visit: Payer: Self-pay | Admitting: Radiology

## 2017-09-23 DIAGNOSIS — C7949 Secondary malignant neoplasm of other parts of nervous system: Principal | ICD-10-CM

## 2017-09-23 DIAGNOSIS — C7931 Secondary malignant neoplasm of brain: Secondary | ICD-10-CM

## 2017-09-23 MED ORDER — GADOBENATE DIMEGLUMINE 529 MG/ML IV SOLN
20.0000 mL | Freq: Once | INTRAVENOUS | Status: AC | PRN
  Administered 2017-09-23: 20 mL via INTRAVENOUS

## 2017-09-24 ENCOUNTER — Encounter (HOSPITAL_COMMUNITY): Payer: Self-pay

## 2017-09-24 ENCOUNTER — Ambulatory Visit (HOSPITAL_COMMUNITY)
Admission: RE | Admit: 2017-09-24 | Discharge: 2017-09-24 | Disposition: A | Discharge status: Home / Self Care | Payer: Medicare HMO | Source: Ambulatory Visit | Attending: Thoracic Surgery (Cardiothoracic Vascular Surgery) | Admitting: Thoracic Surgery (Cardiothoracic Vascular Surgery)

## 2017-09-24 ENCOUNTER — Ambulatory Visit (HOSPITAL_COMMUNITY)
Admission: RE | Admit: 2017-09-24 | Discharge: 2017-09-24 | Disposition: A | Discharge status: Home / Self Care | Payer: Medicare HMO | Source: Ambulatory Visit | Attending: Interventional Radiology | Admitting: Interventional Radiology

## 2017-09-24 DIAGNOSIS — Z87891 Personal history of nicotine dependence: Secondary | ICD-10-CM | POA: Insufficient documentation

## 2017-09-24 DIAGNOSIS — Z79899 Other long term (current) drug therapy: Secondary | ICD-10-CM | POA: Diagnosis not present

## 2017-09-24 DIAGNOSIS — Z9889 Other specified postprocedural states: Secondary | ICD-10-CM

## 2017-09-24 DIAGNOSIS — C3411 Malignant neoplasm of upper lobe, right bronchus or lung: Secondary | ICD-10-CM | POA: Diagnosis not present

## 2017-09-24 DIAGNOSIS — R918 Other nonspecific abnormal finding of lung field: Secondary | ICD-10-CM | POA: Diagnosis present

## 2017-09-24 DIAGNOSIS — J449 Chronic obstructive pulmonary disease, unspecified: Secondary | ICD-10-CM | POA: Insufficient documentation

## 2017-09-24 LAB — PROTIME-INR
INR: 0.94
PROTHROMBIN TIME: 12.4 s (ref 11.4–15.2)

## 2017-09-24 LAB — CBC
HEMATOCRIT: 41.7 % (ref 36.0–46.0)
Hemoglobin: 14.5 g/dL (ref 12.0–15.0)
MCH: 29.4 pg (ref 26.0–34.0)
MCHC: 34.8 g/dL (ref 30.0–36.0)
MCV: 84.6 fL (ref 78.0–100.0)
PLATELETS: 377 10*3/uL (ref 150–400)
RBC: 4.93 MIL/uL (ref 3.87–5.11)
RDW: 13.1 % (ref 11.5–15.5)
WBC: 14.3 10*3/uL — ABNORMAL HIGH (ref 4.0–10.5)

## 2017-09-24 LAB — GLUCOSE, CAPILLARY
GLUCOSE-CAPILLARY: 76 mg/dL (ref 65–99)
GLUCOSE-CAPILLARY: 133 mg/dL — AB (ref 65–99)

## 2017-09-24 LAB — APTT: APTT: 24 s (ref 24–36)

## 2017-09-24 MED ORDER — SODIUM CHLORIDE 0.9 % IV SOLN: INTRAVENOUS | Status: DC

## 2017-09-24 MED ORDER — FENTANYL CITRATE (PF) 100 MCG/2ML IJ SOLN
INTRAMUSCULAR | Status: AC
  Filled 2017-09-24: qty 4

## 2017-09-24 MED ORDER — FENTANYL CITRATE (PF) 100 MCG/2ML IJ SOLN
INTRAMUSCULAR | Status: AC | PRN
  Administered 2017-09-24: 50 ug via INTRAVENOUS

## 2017-09-24 MED ORDER — MIDAZOLAM HCL 2 MG/2ML IJ SOLN
INTRAMUSCULAR | Status: AC
  Filled 2017-09-24: qty 4

## 2017-09-24 MED ORDER — ACETAMINOPHEN 325 MG PO TABS
650.0000 mg | ORAL_TABLET | Freq: Once | ORAL | Status: AC
  Administered 2017-09-24: 650 mg via ORAL

## 2017-09-24 MED ORDER — ACETAMINOPHEN 325 MG PO TABS
ORAL_TABLET | ORAL | Status: AC
  Administered 2017-09-24: 650 mg via ORAL
  Filled 2017-09-24: qty 2

## 2017-09-24 MED ORDER — SODIUM CHLORIDE 0.9 % IV SOLN
INTRAVENOUS | Status: AC | PRN
  Administered 2017-09-24: 10 mL/h via INTRAVENOUS

## 2017-09-24 MED ORDER — LIDOCAINE-EPINEPHRINE 1 %-1:100000 IJ SOLN
INTRAMUSCULAR | Status: AC
  Filled 2017-09-24: qty 1

## 2017-09-24 MED ORDER — MIDAZOLAM HCL 2 MG/2ML IJ SOLN
INTRAMUSCULAR | Status: AC | PRN
  Administered 2017-09-24: 1 mg via INTRAVENOUS

## 2017-09-24 NOTE — Procedures (Signed)
Pre procedural Dx: Right upper lobe pulmonary nodule  Post procedural Dx: Same  Technically successful CT guided biopsy of right upper lobe pulmonary nodule.   EBL: None.   Complications: None immediate.   Ronny Bacon, MD Pager #: [phone]

## 2017-09-24 NOTE — Sedation Documentation (Addendum)
O2 2l/[place] added, scouting images obtained

## 2017-09-24 NOTE — H&P (Signed)
Chief Complaint: Patient was seen in consultation today for right lung mass biopsy at the request of Hendrickson,Steven C  Referring Physician(s): Amesti C  Supervising Physician: Sandi Mariscal  Patient Status: [facility] - Out-pt  History of Present Illness: [name] is a 71 y.o. female   Symptoms of cough and SOB ++ smoker and COPD New RUL mass CT 07/05/17: IMPRESSION: 2.6 x 1.9 cm slightly spiculated right upper lobe pulmonary mass with slight central cavitation, given growth on prior radiographs, findings are suspicious for lung neoplasm. Correlation with PET-CT and/or tissue sampling is recommended.  PET 07/28/2017: IMPRESSION: 1. Intense FDG uptake associated with the cavitary nodule in the right upper lobe which is suspicious for primary bronchogenic carcinoma. Given the presence of internal cavitation this Reagyn represent a squamous cell carcinoma. Correlation with tissue sampling advise. 2. No evidence for hypermetabolic adenopathy or distant metastatic disease. 3. Small, 8 mm cervical lymph node deep to the left parotid gland exhibits mild FDG uptake (SUV max equals 2.77). Nonspecific.  Had been referred and evaluated by Dr Roxan Hockey and planned for surgery when she developed Rt sided weakness Prompted MRI Brain 09/09/17: IMPRESSION: 10 mm left posterior frontal mass with moderate vasogenic edema, likely a solitary metastasis in this clinical setting.  MRI Brain 09/23/2017: IMPRESSION: Slight enlargement of a metastasis affecting the medial left frontoparietal junction, now measuring 9 x 12 x 12 mm. Regional vasogenic edema.  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  The patient was about to undergo an excision for a right upper lobe mass but then began having some right-sided weakness which prompted further workup including an MRI scan of the brain.  This showed a  solitary metastasis measuring 1 cm in the left parasagittal frontal lobe.  This has been reviewed in multidisciplinary brain conference and it was felt that the patient is a good candidate for radiosurgery to this lesion pending pathologic confirmation from the lung.  The patient has seen cardiothoracic surgery and a urgent CT guided biopsy has been ordered.  We discussed with the patient today the role of radiosurgery.  She has also seen neurosurgery and is comfortable with this plan.  We will follow-up on her upcoming biopsy and will tentatively plan to proceed with radiosurgery in the near future.  We will also follow the patient's management in terms of the lung tumor.  It appears that she does not have extensive disease currently systemically.  The patient will continue her current dose of steroids and a refill was given.  Now scheduled for RUL mass biopsy   Past Medical History:  Diagnosis Date  . Anxiety   . Arthritis   . Chronic back pain greater than 3 months duration   . Chronic neck pain   . COPD (chronic obstructive pulmonary disease) ([place])   . Cough   . Diabetes mellitus   . Dyspnea   . GERD (gastroesophageal reflux disease)   . H/O hiatal hernia   . Headache(784.0)   . Hemorrhoids   . Herniated disc   . History of kidney stones   . Hx of echocardiogram 2010   normal EF  . Hypertension   . Palpitations   . Shortness of breath on exertion 11/10/11   "sometimes"  . Vertigo   . Vertigo     Past Surgical History:  Procedure Laterality Date  . ABDOMINAL HYSTERECTOMY  1997?   "partial"  .  ANTERIOR CERVICAL DECOMP/DISCECTOMY FUSION N/A 12/25/2016   Procedure: ANTERIOR CERVICAL DECOMPRESSION/DISCECTOMY FUSION CERVICAL FOUR CERVICAL FIVE;  Surgeon: Ashok Pall, MD;  Location: [facility];  Service: Neurosurgery;  Laterality: N/A;  . CHOLECYSTECTOMY  ~ 2008  . COLONOSCOPY    . EYE SURGERY     cateracts  . FRACTURE SURGERY  2004?   right shoulder  . KNEE ARTHROSCOPY  2004    right  . KNEE SURGERY     right  . neck fusion    . SHOULDER OPEN ROTATOR CUFF REPAIR  07/20/2011   left  . TONSILLECTOMY      Allergies: Oxycodone  Medications: Prior to Admission medications   Medication Sig Start Date End Date Taking? Authorizing Provider  albuterol (PROVENTIL) (2.5 MG/3ML) 0.083% nebulizer solution USE 1 VIAL EVERY 6 HOURS AS NEEDED FOR SHORTNESS OF BREATH 10/15/15  Yes [provider]  albuterol (VENTOLIN HFA) 108 (90 Base) MCG/ACT inhaler Inhale 2 puffs into the lungs every 6 (six) hours as needed for wheezing or shortness of breath.   Yes [provider]  ALPRAZolam (XANAX) 0.25 MG tablet Take 0.25 mg by mouth daily as needed for anxiety.  10/16/14  Yes [provider]  amLODipine (NORVASC) 5 MG tablet Take 5 mg by mouth 2 (two) times daily.    Yes [provider]  cholecalciferol (VITAMIN D) 1000 units tablet Take 1,000 Units by mouth daily.   Yes [provider]  cloNIDine (CATAPRES) 0.1 MG tablet Take 0.1 mg by mouth 2 (two) times daily as needed (high blood pressure).    Yes [provider]  conjugated estrogens (PREMARIN) vaginal cream Place 0.5 Applicatorfuls vaginally at bedtime as needed.   Yes [provider]  cyclobenzaprine (FLEXERIL) 10 MG tablet Take 10 mg by mouth at bedtime as needed for muscle spasms.   Yes [provider]  dexamethasone (DECADRON) 4 MG tablet Take 1 tablet (4 mg total) by mouth 3 (three) times daily. 09/14/17 09/24/17 Yes Kyung Rudd, MD  gabapentin (NEURONTIN) 100 MG capsule Take 100 mg by mouth daily as needed (for leg pain).  05/10/17  Yes [provider]  hydrALAZINE (APRESOLINE) 50 MG tablet Take 50 mg by mouth 3 (three) times daily.    Yes [provider]  JANUMET 50-500 MG tablet Take 1 tablet by mouth daily. 03/27/17  Yes [provider]  KLOR-CON M10 10 MEQ tablet Take 1 tablet (10 mEq total) by mouth daily. Patient taking  differently: Take 10 mEq by mouth 2 (two) times daily.  07/07/17  Yes Dixie Dials, MD  levETIRAcetam (KEPPRA) 500 MG tablet Take 1 tablet (500 mg total) by mouth 2 (two) times daily. 09/09/17 10/09/17 Yes Charisse March, MD  loratadine (CLARITIN) 10 MG tablet Take 10 mg by mouth daily as needed for allergies.   Yes [provider]  metoprolol succinate (TOPROL-XL) 25 MG 24 hr tablet Take 25 mg by mouth daily. 03/20/17  Yes [provider]  ondansetron (ZOFRAN ODT) 4 MG disintegrating tablet Take 1 tablet (4 mg total) by mouth every 8 (eight) hours as needed for nausea or vomiting. Patient taking differently: Take 4 mg by mouth every 8 (eight) hours as needed for nausea or vomiting. DISSOLVE IN THE MOUTH 02/27/17  Yes Duffy Bruce, MD  rosuvastatin (CRESTOR) 10 MG tablet Take 10 mg by mouth daily.   Yes [provider]  Roma Schanz test strip  09/13/17   [provider]     Family History  Problem Relation Age of Onset  . Lung cancer Sister     Social History   Social History  . Marital status: Married    Spouse name: N/A  . Number of children: N/A  . Years of education: N/A   Social History Main Topics  . Smoking status: Former Smoker    Packs/day: 0.25    Years: 30.00    Types: Cigarettes    Quit date: 07/08/2017  . Smokeless tobacco: Never Used     Comment: 4-5 cigs / day  . Alcohol use No  . Drug use: No  . Sexual activity: No   Other Topics Concern  . None   Social History Narrative  . None   Review of Systems: A 12 point ROS discussed and pertinent positives are indicated in the HPI above.  All other systems are negative.  Review of Systems  Constitutional: Positive for activity change and fatigue. Negative for appetite change and fever.  Respiratory: Positive for cough and shortness of breath.   Cardiovascular: Positive for chest pain.  Gastrointestinal: Negative for abdominal pain.  Genitourinary: Positive for dysuria.        Sxs x 24-48 hrs; some better today Rec: please see PMD if sxs continue---she is agreeable  Musculoskeletal: Negative for back pain.  Neurological: Positive for weakness.  Psychiatric/Behavioral: Negative for behavioral problems and confusion.    Vital Signs: BP 139/74   Pulse (!) 54   Temp 97.8 F (36.6 C)   Resp 18   Ht 5\' 7"  (1.702 m)   Wt 224 lb (101.6 kg)   SpO2 100%   BMI 35.08 kg/m   Physical Exam  Constitutional: She is oriented to person, place, and time. She appears well-nourished.  Cardiovascular: Normal rate, regular rhythm and normal heart sounds.   Pulmonary/Chest: Effort normal and breath sounds normal. She has no wheezes.  Abdominal: Soft. Bowel sounds are normal. There is no tenderness.  Musculoskeletal: Normal range of motion.  Neurological: She is alert and oriented to person, place, and time.  Skin: Skin is warm and dry.  Psychiatric: She has a normal mood and affect. Her behavior is normal. Judgment and thought content normal.  Nursing note and vitals reviewed.   Imaging: Dg Chest 2 View  Result Date: 09/06/2017 CLINICAL DATA:  71 y/o  F; 09/09/2017 right upper lobectomy. EXAM: CHEST  2 VIEW COMPARISON:  08/24/2017 chest radiograph. FINDINGS: Stable normal cardiac silhouette. Aortic atherosclerosis with calcification. Stable right upper lobe pulmonary nodule. No new consolidation. No pleural effusion or pneumothorax. No acute osseous abnormality is evident. IMPRESSION: Stable right upper lobe pulmonary nodule.  No new consolidation. Electronically Signed   By: Kristine Garbe M.D.   On: 09/06/2017 17:09   Ct Head Wo Contrast  Result Date: 09/09/2017 CLINICAL DATA:  Sensation of right arm and leg heaviness and numbness which began last night. History of lung carcinoma. EXAM: CT HEAD WITHOUT CONTRAST TECHNIQUE: Contiguous axial images were obtained from the base of the skull through the vertex without intravenous contrast. COMPARISON:  Head CT scan  05/27/2017.  Brain MRI 03/13/2015. FINDINGS: Brain: Partially empty sella is noted. There is a new large focus of hypoattenuation high left frontal lobe with sparing of the gray matter most consistent with vasogenic edema and presence of a mass lesion. No evidence of acute infarct, hemorrhage, hydrocephalus, pneumocephalus or midline shift. Vascular: Atherosclerosis noted. Skull: Intact.  No focal lesion. Sinuses/Orbits: Minimal ethmoid air cell disease is noted. Other: Negative. IMPRESSION: Large focus of  vasogenic edema in the high left frontal lobe highly suspicious for metastatic disease in this patient with a history of lung carcinoma. Brain MRI with and without contrast is recommended for further evaluation. These results were called by telephone at the time of interpretation on 09/09/2017 at 10:04 am to Dr. Charisse March , who verbally acknowledged these results. Electronically Signed   By: Inge Rise M.D.   On: 09/09/2017 10:07   Mr Jeri Cos ZJ Contrast  Result Date: 09/23/2017 CLINICAL DATA:  Abnormal pulmonary nodule. Left brain mass. Follow-up. EXAM: MRI HEAD WITHOUT AND WITH CONTRAST TECHNIQUE: Multiplanar, multiecho pulse sequences of the brain and surrounding structures were obtained without and with intravenous contrast. CONTRAST:  4mL MULTIHANCE GADOBENATE DIMEGLUMINE 529 MG/ML IV SOLN COMPARISON:  09/09/2017 FINDINGS: Brain: Again demonstrated is a solitary mass lesion in the medial left frontoparietal junction region measuring 9 x 12 x 12 mm, increased 1 or 2 mm since the previous study. Regional vasogenic edema persists cysts. No second lesion is identified. Elsewhere, the brain shows mild chronic small-vessel ischemic change of the pons in the cerebral hemispheric white matter. No hydrocephalus, shift or extra-axial collection. No hemorrhage. Vascular: Major vessels at the base of the brain show flow. Skull and upper cervical spine: Negative Sinuses/Orbits: Clear/normal Other: None  IMPRESSION: Slight enlargement of a metastasis affecting the medial left frontoparietal junction, now measuring 9 x 12 x 12 mm. Regional vasogenic edema. Electronically Signed   By: Nelson Chimes M.D.   On: 09/23/2017 14:01   Mr Brain W And Wo Contrast  Result Date: 09/09/2017 CLINICAL DATA:  Right-sided weakness and heaviness, onset yesterday. Concerning pulmonary nodule on PET-CT. Patient was to have resection today. EXAM: MRI HEAD WITHOUT AND WITH CONTRAST TECHNIQUE: Multiplanar, multiecho pulse sequences of the brain and surrounding structures were obtained without and with intravenous contrast. CONTRAST:  8mL MULTIHANCE GADOBENATE DIMEGLUMINE 529 MG/ML IV SOLN COMPARISON:  Head CT from earlier today FINDINGS: Brain: Solidly enhancing mass in the parasagittal posterior left frontal lobe measuring 10 mm. There is moderate surrounding vasogenic edema. No second mass is seen. No infarct, hemorrhage, or hydrocephalus. Mild chronic microvascular ischemic type FLAIR hyperintensity in the cerebral white matter and pons. Vascular: Major flow voids are preserved Skull and upper cervical spine: Negative for marrow lesion Sinuses/Orbits: Negative IMPRESSION: 10 mm left posterior frontal mass with moderate vasogenic edema, likely a solitary metastasis in this clinical setting. Electronically Signed   By: Monte Fantasia M.D.   On: 09/09/2017 12:15    Labs:  CBC:  Recent Labs  08/24/17 0508 09/06/17 1428 09/09/17 0915 09/24/17 0924  WBC 5.4 10.3 7.9 14.3*  HGB 12.6 13.4 12.9 14.5  HCT 37.6 40.9 38.2 41.7  PLT 355 303 303 377    COAGS:  Recent Labs  07/06/17 0334 09/06/17 1428 09/09/17 0914 09/24/17 0924  INR 0.95 0.88 0.97 0.94  APTT  --  29 30 24     BMP:  Recent Labs  08/23/17 2240 08/24/17 0508 09/06/17 1428 09/09/17 0915  NA 136 136 133* 135  K 3.4* 3.8 4.3 4.2  CL 102 103 101 102  CO2 29 24 23 25   GLUCOSE 138* 266* 124* 123*  BUN 11 9 15 13   CALCIUM 8.7* 9.0 9.1 8.7*    CREATININE 1.20* 1.19* 1.22* 1.06*  GFRNONAA 45* 45* 44* 52*  GFRAA 52* 52* 51* >60    LIVER FUNCTION TESTS:  Recent Labs  07/05/17 2011 08/23/17 2240 09/06/17 1428 09/09/17 0914  BILITOT 0.4 0.4  0.3 0.5  AST 20 14* 14* 15  ALT 11* 7* 7* 9*  ALKPHOS 112 101 118 124  PROT 6.8 6.1* 7.4 6.9  ALBUMIN 3.5 3.1* 3.6 3.4*    TUMOR MARKERS: No results for input(s): AFPTM, CEA, CA199, CHROMGRNA in the last 8760 hours.  Assessment and Plan:  Rt lung mass; +PET Was scheduled for surgery 09/09/17---developed right sided weakness MRI Brain revealed metastasis Now scheduled for lung mass biopsy for tissue diagnosis- for possible radiosurgery Risks and benefits discussed with the patient including, but not limited to bleeding, hemoptysis, respiratory failure requiring intubation, infection, pneumothorax requiring chest tube placement, stroke from air embolism or even death. All of the patient's questions were answered, patient is agreeable to proceed. Consent signed and in chart.  Pt also complains of 1-2 day dysuria; taking AZO Standard and pushing fluids-- some better today. To see PMD if sxs continue   Thank you for this interesting consult.  I greatly enjoyed meeting Briannah F Broy and look forward to participating in their care.  A copy of this report was sent to the requesting provider on this date.  Electronically Signed: Lavonia Drafts, PA-C 09/24/2017, 10:36 AM   I spent a total of  30 Minutes   in face to face in clinical consultation, greater than 50% of which was counseling/coordinating care for right lung mass biopsy

## 2017-09-24 NOTE — Sedation Documentation (Signed)
Patient is resting comfortably.  Tolerating well 

## 2017-09-24 NOTE — Sedation Documentation (Signed)
O2 d/c'd 

## 2017-09-24 NOTE — Progress Notes (Signed)
Pam Turpin,PA notified of client c/o burning with urination since yesterday

## 2017-09-24 NOTE — Discharge Instructions (Signed)
Needle Biopsy of the Lung, Care After °This sheet gives you information about how to care for yourself after your procedure. Your health care provider Kaci also give you more specific instructions. If you have problems or questions, contact your health care provider. °What can I expect after the procedure? °After the procedure, it is common to have: °· Soreness, pain, and tenderness where a tissue sample was taken (biopsy site). °· A cough. °· A sore throat. ° °Follow these instructions at home: °Biopsy site care °· Follow instructions from your health care provider about when to remove the bandage that was placed on the biopsy site. °· Keep the bandage dry until it has been removed. °· Check your biopsy site every day for signs of infection. Check for: °? More redness, swelling, or pain. °? More fluid or blood. °? Warmth to the touch. °? Pus or a bad smell. °General instructions °· Rest as directed by your health care provider. Ask your health care provider what activities are safe for you. °· Do not take baths, swim, or use a hot tub until your health care provider approves. °· Take over-the-counter and prescription medicines only as told by your health care provider. °· If you have airplane travel scheduled, talk with your health care provider about when it is safe for you to travel by airplane. °· It is up to you to get the results of your procedure. Ask your health care provider, or the department that is doing the procedure, when your results will be ready. °· Keep all follow-up visits as told by your health care provider. This is important. °Contact a health care provider if: °· You have more redness, swelling, or pain around your biopsy site. °· You have more fluid or blood coming from your biopsy site. °· Your biopsy site feels warm to the touch. °· You have pus or a bad smell coming from your biopsy site. °· You have a fever. °· You have pain that does not get better with medicine. °Get help right away  if: °· You have problems breathing. °· You have chest pain. °· You cough up blood. °· You faint. °· You have a fast heart rate. °Summary °· After a needle biopsy of the lung, it is common to have a cough, a sore throat, or soreness, pain, and tenderness where a tissue sample was taken (biopsy site). °· You should check your biopsy area every day for signs of infection, including pus or a bad smell, warmth, more fluid or blood, or more redness, swelling, or pain. °· You should not take baths, swim, or use a hot tub until your health care provider approves. °· It is up to you to get the results of your procedure. Ask your health care provider, or the department that is doing the procedure, when your results will be ready. °This information is not intended to replace advice given to you by your health care provider. Make sure you discuss any questions you have with your health care provider. °Document Released: 09/06/2007 Document Revised: 09/30/2016 Document Reviewed: 09/30/2016 °Elsevier Interactive Patient Education © 2017 [employer] ° °Moderate Conscious Sedation, Adult, Care After °These instructions provide you with information about caring for yourself after your procedure. Your health care provider Dalesha also give you more specific instructions. Your treatment has been planned according to current medical practices, but problems sometimes occur. Call your health care provider if you have any problems or questions after your procedure. °What can I expect after the   procedure? °After your procedure, it is common: °· To feel sleepy for several hours. °· To feel clumsy and have poor balance for several hours. °· To have poor judgment for several hours. °· To vomit if you eat too soon. ° °Follow these instructions at home: °For at least 24 hours after the procedure: ° °· Do not: °? Participate in activities where you could fall or become injured. °? Drive. °? Use heavy machinery. °? Drink alcohol. °? Take sleeping  pills or medicines that cause drowsiness. °? Make important decisions or sign legal documents. °? Take care of children on your own. °· Rest. °Eating and drinking °· Follow the diet recommended by your health care provider. °· If you vomit: °? Drink water, juice, or soup when you can drink without vomiting. °? Make sure you have little or no nausea before eating solid foods. °General instructions °· Have a responsible adult stay with you until you are awake and alert. °· Take over-the-counter and prescription medicines only as told by your health care provider. °· If you smoke, do not smoke without supervision. °· Keep all follow-up visits as told by your health care provider. This is important. °Contact a health care provider if: °· You keep feeling nauseous or you keep vomiting. °· You feel light-headed. °· You develop a rash. °· You have a fever. °Get help right away if: °· You have trouble breathing. °This information is not intended to replace advice given to you by your health care provider. Make sure you discuss any questions you have with your health care provider. °Document Released: 08/30/2013 Document Revised: 04/13/2016 Document Reviewed: 02/29/2016 °Elsevier Interactive Patient Education © 2018 [employer] ° °

## 2017-09-27 ENCOUNTER — Ambulatory Visit
Admission: RE | Admit: 2017-09-27 | Discharge: 2017-09-27 | Disposition: A | Discharge status: Home / Self Care | Payer: Medicare HMO | Source: Ambulatory Visit | Attending: Radiation Oncology | Admitting: Radiation Oncology

## 2017-09-27 VITALS — BP 159/74 | HR 58 | Temp 98.0°F | Resp 20 | Ht 67.0 in | Wt 220.6 lb

## 2017-09-27 DIAGNOSIS — R918 Other nonspecific abnormal finding of lung field: Secondary | ICD-10-CM

## 2017-09-27 DIAGNOSIS — C7931 Secondary malignant neoplasm of brain: Secondary | ICD-10-CM

## 2017-09-27 MED ORDER — SODIUM CHLORIDE 0.9% FLUSH: 10.0000 mL | Freq: Once | INTRAVENOUS | Status: DC

## 2017-09-28 ENCOUNTER — Ambulatory Visit (HOSPITAL_BASED_OUTPATIENT_CLINIC_OR_DEPARTMENT_OTHER): Payer: Medicare HMO | Admitting: Internal Medicine

## 2017-09-28 ENCOUNTER — Telehealth: Payer: Self-pay | Admitting: Internal Medicine

## 2017-09-28 ENCOUNTER — Encounter: Payer: Self-pay | Admitting: Internal Medicine

## 2017-09-28 ENCOUNTER — Other Ambulatory Visit (HOSPITAL_BASED_OUTPATIENT_CLINIC_OR_DEPARTMENT_OTHER): Payer: Medicare HMO

## 2017-09-28 VITALS — BP 134/54 | HR 72 | Temp 98.5°F | Resp 18 | Ht 67.0 in | Wt 223.0 lb

## 2017-09-28 DIAGNOSIS — R51 Headache: Secondary | ICD-10-CM | POA: Diagnosis not present

## 2017-09-28 DIAGNOSIS — R918 Other nonspecific abnormal finding of lung field: Secondary | ICD-10-CM

## 2017-09-28 DIAGNOSIS — R42 Dizziness and giddiness: Secondary | ICD-10-CM | POA: Diagnosis not present

## 2017-09-28 DIAGNOSIS — R5383 Other fatigue: Secondary | ICD-10-CM | POA: Diagnosis not present

## 2017-09-28 LAB — CBC WITH DIFFERENTIAL/PLATELET
BASO%: 0.9 % (ref 0.0–2.0)
Basophils Absolute: 0.1 10*3/uL (ref 0.0–0.1)
EOS ABS: 0 10*3/uL (ref 0.0–0.5)
EOS%: 0.1 % (ref 0.0–7.0)
HCT: 40.3 % (ref 34.8–46.6)
HEMOGLOBIN: 13.4 g/dL (ref 11.6–15.9)
LYMPH#: 0.9 10*3/uL (ref 0.9–3.3)
LYMPH%: 7.6 % — AB (ref 14.0–49.7)
MCH: 29.2 pg (ref 25.1–34.0)
MCHC: 33.2 g/dL (ref 31.5–36.0)
MCV: 87.9 fL (ref 79.5–101.0)
MONO#: 0.5 10*3/uL (ref 0.1–0.9)
MONO%: 4.7 % (ref 0.0–14.0)
NEUT%: 86.7 % — ABNORMAL HIGH (ref 38.4–76.8)
NEUTROS ABS: 10.1 10*3/uL — AB (ref 1.5–6.5)
PLATELETS: 343 10*3/uL (ref 145–400)
RBC: 4.58 10*6/uL (ref 3.70–5.45)
RDW: 13.7 % (ref 11.2–14.5)
WBC: 11.7 10*3/uL — AB (ref 3.9–10.3)

## 2017-09-28 LAB — COMPREHENSIVE METABOLIC PANEL
ALT: 12 U/L (ref 0–55)
AST: 11 U/L (ref 5–34)
Albumin: 3.1 g/dL — ABNORMAL LOW (ref 3.5–5.0)
Alkaline Phosphatase: 86 U/L (ref 40–150)
Anion Gap: 8 mEq/L (ref 3–11)
BUN: 25.7 mg/dL (ref 7.0–26.0)
CALCIUM: 9.1 mg/dL (ref 8.4–10.4)
CHLORIDE: 101 meq/L (ref 98–109)
CO2: 23 mEq/L (ref 22–29)
Creatinine: 1.2 mg/dL — ABNORMAL HIGH (ref 0.6–1.1)
EGFR: 55 mL/min/{1.73_m2} — ABNORMAL LOW (ref >60)
Glucose: 248 mg/dl — ABNORMAL HIGH (ref 70–140)
POTASSIUM: 4.5 meq/L (ref 3.5–5.1)
SODIUM: 131 meq/L — AB (ref 136–145)
Total Bilirubin: 0.35 mg/dL (ref 0.20–1.20)
Total Protein: 6.5 g/dL (ref 6.4–8.3)

## 2017-09-28 NOTE — Progress Notes (Signed)
[place] [phone]   [phone]  CONSULT NOTE  REFERRING PHYSICIAN: Dr. Modesto Charon.  REASON FOR CONSULTATION:  71 years old African-American female recently diagnosed with lung cancer.  HPI [name] is a 71 y.o. female with past medical history significant for anxiety/depression, diabetes mellitus, COPD, GERD, hypertension, vertigo as well as chronic neck pain.  The patient also has a long history of smoking.  She was complaining of increasing cough and shortness of breath.  She had a chest x-ray performed on July 05, 2017 and that showed increase in size of an opacity at the right apex which measured 2.5 x 1.7 cm concerning for pulmonary neoplasm.  The patient had CT scan of the chest with contrast performed on 07/05/2017 and that showed 2.6 x 1.9 cm slightly spiculated right upper lobe pulmonary mass with a slight central cavitation suspicious for lung neoplasm.  A PET scan on 07/28/2017 showed cavitary nodule in the right upper lobe measuring 2.5 cm with SUV max of 15.0.  There was no additional hypermetabolic pulmonary nodules.  There was no evidence of hypermetabolic adenopathy or distant metastatic disease.  The patient was seen by Dr. Roxan Hockey and was a schedule for surgical resection of the right upper lobe and at the morning of the surgery she was complaining of heaviness of her right lower extremity and numbness in the right upper extremity.  CT scan of the head without contrast on September 09, 2017 showed large focus of vasogenic edema in the high left frontal lobe highly suspicious for metastatic disease.  MRI of the brain with and without contrast on September 09, 2017 showed 1.0 cm left posterior frontal mass with moderate vasogenic edema likely a solitary metastasis in this clinical setting.  The patient was referred to Dr. Lisbeth Renshaw and repeat MRI of the brain on September 23, 2017 showed slight enlargement of the metastatic disease  affecting the medial left frontoparietal junction and now measuring 0.9 x 1.2 x 1.2 cm with regional vasogenic edema.  The patient is a scheduled for stereotactic radiotherapy tomorrow under the care of Dr. Lisbeth Renshaw. She also underwent CT-guided core biopsy of the right upper lobe lung nodule by interventional radiology. The final pathology is still pending. Dr. Roxan Hockey kindly refer the patient to me today for evaluation and recommendation regarding her condition. When seen today the patient continues to complain of increasing fatigue and dizzy spells as well as headache.  She denied having any chest pain, shortness of breath, cough or hemoptysis.  She denied having any nausea, vomiting, diarrhea or constipation.  She has no weight loss or night sweats. Family history significant for a sister diagnosed with lung cancer at age 61, mother still alive at age 73, father died from heart disease and another sister had breast cancer. The patient is married and has 5 children.  She was accompanied today by her husband [name] and her daughter [name].  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  She has a history for smoking 1 pack/day for around 30 years and quit in August 2018.  She has no history of alcohol or drug abuse.  HPI  Past Medical History:  Diagnosis Date  . Anxiety   . Arthritis   . Chronic back pain greater than 3 months duration   . Chronic neck pain   . COPD (chronic obstructive pulmonary disease) ([place])   . Cough   . Diabetes mellitus   . Dyspnea   .  GERD (gastroesophageal reflux disease)   . H/O hiatal hernia   . Headache(784.0)   . Hemorrhoids   . Herniated disc   . History of kidney stones   . Hx of echocardiogram 2010   normal EF  . Hypertension   . Palpitations   . Shortness of breath on exertion 11/10/11   "sometimes"  . Vertigo   . Vertigo     Past Surgical History:  Procedure Laterality Date  . ABDOMINAL HYSTERECTOMY  1997?   "partial"  . CHOLECYSTECTOMY  ~  2008  . COLONOSCOPY    . EYE SURGERY     cateracts  . FRACTURE SURGERY  2004?   right shoulder  . KNEE ARTHROSCOPY  2004   right  . KNEE SURGERY     right  . neck fusion    . SHOULDER OPEN ROTATOR CUFF REPAIR  07/20/2011   left  . TONSILLECTOMY      Family History  Problem Relation Age of Onset  . Lung cancer Sister     Social History Social History   Tobacco Use  . Smoking status: Former Smoker    Packs/day: 0.25    Years: 30.00    Pack years: 7.50    Types: Cigarettes    Last attempt to quit: 07/08/2017    Years since quitting: 0.2  . Smokeless tobacco: Never Used  . Tobacco comment: 4-5 cigs / day  Substance Use Topics  . Alcohol use: No  . Drug use: No    Allergies  Allergen Reactions  . Oxycodone Hives and Itching    Current Outpatient Medications  Medication Sig Dispense Refill  . albuterol (VENTOLIN HFA) 108 (90 Base) MCG/ACT inhaler Inhale 2 puffs into the lungs every 6 (six) hours as needed for wheezing or shortness of breath.    . ALPRAZolam (XANAX) 0.25 MG tablet Take 0.25 mg by mouth daily as needed for anxiety.   2  . amLODipine (NORVASC) 5 MG tablet Take 5 mg by mouth 2 (two) times daily.     . B-D INS SYRINGE 0.5CC/31GX5/16 31G X 5/16" 0.5 ML MISC 2 (two) times daily. as directed  6  . cholecalciferol (VITAMIN D) 1000 units tablet Take 1,000 Units by mouth daily.    . cloNIDine (CATAPRES) 0.1 MG tablet Take 0.1 mg by mouth 2 (two) times daily as needed (high blood pressure).     . conjugated estrogens (PREMARIN) vaginal cream Place 0.5 Applicatorfuls vaginally at bedtime as needed.    . cyclobenzaprine (FLEXERIL) 10 MG tablet Take 10 mg by mouth at bedtime as needed for muscle spasms.    Marland Kitchen esomeprazole (NEXIUM) 40 MG capsule Take daily by mouth.  2  . gabapentin (NEURONTIN) 100 MG capsule Take 100 mg by mouth daily as needed (for leg pain).   1  . hydrALAZINE (APRESOLINE) 50 MG tablet Take 50 mg by mouth 3 (three) times daily.     . INCRUSE  ELLIPTA 62.5 MCG/INH AEPB TAKE 1 PUFF BY MOUTH EVERY DAY  5  . JANUMET 50-500 MG tablet Take 1 tablet by mouth daily.  6  . KLOR-CON M10 10 MEQ tablet Take 1 tablet (10 mEq total) by mouth daily. (Patient taking differently: Take 10 mEq by mouth 2 (two) times daily. )    . levETIRAcetam (KEPPRA) 500 MG tablet Take 1 tablet (500 mg total) by mouth 2 (two) times daily. 60 tablet 0  . loratadine (CLARITIN) 10 MG tablet Take 10 mg by mouth daily as needed  for allergies.    Marland Kitchen meclizine (ANTIVERT) 25 MG tablet Take 25 mg daily as needed by mouth.  1  . metoprolol succinate (TOPROL-XL) 25 MG 24 hr tablet Take 25 mg by mouth daily.  1  . ondansetron (ZOFRAN ODT) 4 MG disintegrating tablet Take 1 tablet (4 mg total) by mouth every 8 (eight) hours as needed for nausea or vomiting. (Patient taking differently: Take 4 mg by mouth every 8 (eight) hours as needed for nausea or vomiting. DISSOLVE IN THE MOUTH) 20 tablet 0  . ONETOUCH VERIO test strip     . predniSONE (DELTASONE) 10 MG tablet TAKE 1 TABLET BY MOUTH DAILY WITH BREAKFAST. AFTER 1 WEEK, TAKE 1/2 TABLET FOR 6 DAYS.  1  . rosuvastatin (CRESTOR) 10 MG tablet Take 10 mg by mouth daily.    Marland Kitchen albuterol (PROVENTIL) (2.5 MG/3ML) 0.083% nebulizer solution USE 1 VIAL EVERY 6 HOURS AS NEEDED FOR SHORTNESS OF BREATH  0   No current facility-administered medications for this visit.    Facility-Administered Medications Ordered in Other Visits  Medication Dose Route Frequency Provider Last Rate Last Dose  . fentaNYL (SUBLIMAZE) injection    Anesthesia Intra-op Candis Shine, CRNA   50 mcg at 09/09/17 0718  . lactated ringers infusion    Continuous PRN Harder, Rebeca Alert, CRNA      . lactated ringers infusion    Continuous PRN Harder, Rebeca Alert, CRNA      . midazolam (VERSED) 5 MG/5ML injection    Anesthesia Intra-op Candis Shine, CRNA   1 mg at 09/09/17 2831    Review of Systems  Constitutional: positive for fatigue Eyes: negative Ears, nose, mouth,  throat, and face: negative Respiratory: negative Cardiovascular: negative Gastrointestinal: negative Genitourinary:negative Integument/breast: negative Hematologic/lymphatic: negative Musculoskeletal:negative Neurological: positive for dizziness and headaches Behavioral/Psych: negative Endocrine: negative Allergic/Immunologic: negative  Physical Exam  DVV:OHYWV, healthy, no distress, well nourished, well developed and anxious SKIN: skin color, texture, turgor are normal, no rashes or significant lesions HEAD: Normocephalic, No masses, lesions, tenderness or abnormalities EYES: normal, PERRLA, Conjunctiva are pink and non-injected EARS: External ears normal, Canals clear OROPHARYNX:no exudate, no erythema and lips, buccal mucosa, and tongue normal  NECK: supple, no adenopathy, no JVD LYMPH:  no palpable lymphadenopathy, no hepatosplenomegaly BREAST:not examined LUNGS: clear to auscultation , and palpation HEART: regular rate & rhythm, no murmurs and no gallops ABDOMEN:abdomen soft, non-tender, normal bowel sounds and no masses or organomegaly BACK: Back symmetric, no curvature., No CVA tenderness EXTREMITIES:no joint deformities, effusion, or inflammation, no edema, no skin discoloration  NEURO: alert & oriented x 3 with fluent speech, no focal motor/sensory deficits  PERFORMANCE STATUS: ECOG 1  LABORATORY DATA: Lab Results  Component Value Date   WBC 11.7 (H) 09/28/2017   HGB 13.4 09/28/2017   HCT 40.3 09/28/2017   MCV 87.9 09/28/2017   PLT 343 09/28/2017      Chemistry      Component Value Date/Time   NA 131 (L) 09/28/2017 1355   K 4.5 09/28/2017 1355   CL 102 09/09/2017 0915   CO2 23 09/28/2017 1355   BUN 25.7 09/28/2017 1355   CREATININE 1.2 (H) 09/28/2017 1355      Component Value Date/Time   CALCIUM 9.1 09/28/2017 1355   ALKPHOS 86 09/28/2017 1355   AST 11 09/28/2017 1355   ALT 12 09/28/2017 1355   BILITOT 0.35 09/28/2017 1355       RADIOGRAPHIC  STUDIES: Dg Chest 2 View  Result Date: 09/06/2017  CLINICAL DATA:  71 y/o  F; 09/09/2017 right upper lobectomy. EXAM: CHEST  2 VIEW COMPARISON:  08/24/2017 chest radiograph. FINDINGS: Stable normal cardiac silhouette. Aortic atherosclerosis with calcification. Stable right upper lobe pulmonary nodule. No new consolidation. No pleural effusion or pneumothorax. No acute osseous abnormality is evident. IMPRESSION: Stable right upper lobe pulmonary nodule.  No new consolidation. Electronically Signed   By: Kristine Garbe M.D.   On: 09/06/2017 17:09   Ct Head Wo Contrast  Result Date: 09/09/2017 CLINICAL DATA:  Sensation of right arm and leg heaviness and numbness which began last night. History of lung carcinoma. EXAM: CT HEAD WITHOUT CONTRAST TECHNIQUE: Contiguous axial images were obtained from the base of the skull through the vertex without intravenous contrast. COMPARISON:  Head CT scan 05/27/2017.  Brain MRI 03/13/2015. FINDINGS: Brain: Partially empty sella is noted. There is a new large focus of hypoattenuation high left frontal lobe with sparing of the gray matter most consistent with vasogenic edema and presence of a mass lesion. No evidence of acute infarct, hemorrhage, hydrocephalus, pneumocephalus or midline shift. Vascular: Atherosclerosis noted. Skull: Intact.  No focal lesion. Sinuses/Orbits: Minimal ethmoid air cell disease is noted. Other: Negative. IMPRESSION: Large focus of vasogenic edema in the high left frontal lobe highly suspicious for metastatic disease in this patient with a history of lung carcinoma. Brain MRI with and without contrast is recommended for further evaluation. These results were called by telephone at the time of interpretation on 09/09/2017 at 10:04 am to Dr. Charisse March , who verbally acknowledged these results. Electronically Signed   By: Inge Rise M.D.   On: 09/09/2017 10:07   Mr Jeri Cos JJ Contrast  Result Date: 09/23/2017 CLINICAL DATA:   Abnormal pulmonary nodule. Left brain mass. Follow-up. EXAM: MRI HEAD WITHOUT AND WITH CONTRAST TECHNIQUE: Multiplanar, multiecho pulse sequences of the brain and surrounding structures were obtained without and with intravenous contrast. CONTRAST:  17mL MULTIHANCE GADOBENATE DIMEGLUMINE 529 MG/ML IV SOLN COMPARISON:  09/09/2017 FINDINGS: Brain: Again demonstrated is a solitary mass lesion in the medial left frontoparietal junction region measuring 9 x 12 x 12 mm, increased 1 or 2 mm since the previous study. Regional vasogenic edema persists cysts. No second lesion is identified. Elsewhere, the brain shows mild chronic small-vessel ischemic change of the pons in the cerebral hemispheric white matter. No hydrocephalus, shift or extra-axial collection. No hemorrhage. Vascular: Major vessels at the base of the brain show flow. Skull and upper cervical spine: Negative Sinuses/Orbits: Clear/normal Other: None IMPRESSION: Slight enlargement of a metastasis affecting the medial left frontoparietal junction, now measuring 9 x 12 x 12 mm. Regional vasogenic edema. Electronically Signed   By: Nelson Chimes M.D.   On: 09/23/2017 14:01   Mr Brain W And Wo Contrast  Result Date: 09/09/2017 CLINICAL DATA:  Right-sided weakness and heaviness, onset yesterday. Concerning pulmonary nodule on PET-CT. Patient was to have resection today. EXAM: MRI HEAD WITHOUT AND WITH CONTRAST TECHNIQUE: Multiplanar, multiecho pulse sequences of the brain and surrounding structures were obtained without and with intravenous contrast. CONTRAST:  68mL MULTIHANCE GADOBENATE DIMEGLUMINE 529 MG/ML IV SOLN COMPARISON:  Head CT from earlier today FINDINGS: Brain: Solidly enhancing mass in the parasagittal posterior left frontal lobe measuring 10 mm. There is moderate surrounding vasogenic edema. No second mass is seen. No infarct, hemorrhage, or hydrocephalus. Mild chronic microvascular ischemic type FLAIR hyperintensity in the cerebral white matter  and pons. Vascular: Major flow voids are preserved Skull and upper cervical spine: Negative for  marrow lesion Sinuses/Orbits: Negative IMPRESSION: 10 mm left posterior frontal mass with moderate vasogenic edema, likely a solitary metastasis in this clinical setting. Electronically Signed   By: Monte Fantasia M.D.   On: 09/09/2017 12:15   Ct Biopsy  Result Date: 09/24/2017 INDICATION: Hypermetabolic right upper lobe pulmonary nodule. Please perform CT-guided biopsy for tissue diagnostic purposes. EXAM: CT-GUIDED RIGHT UPPER LOBE PULMONARY NODULE BIOPSY COMPARISON:  PET-CT - 07/28/2017; chest CT - 07/05/2017 MEDICATIONS: None. ANESTHESIA/SEDATION: Fentanyl 50 mcg IV; Versed 1 mg IV Sedation time: 10 minutes; The patient was continuously monitored during the procedure by the interventional radiology nurse under my direct supervision. CONTRAST:  None COMPLICATIONS: None immediate. PROCEDURE: Informed consent was obtained from the patient following an explanation of the procedure, risks, benefits and alternatives. The patient understands,agrees and consents for the procedure. All questions were addressed. A time out was performed prior to the initiation of the procedure. The patient was positioned supine on the CT table and a limited chest CT was performed for procedural planning demonstrating size of the macrolobulated approximately 3.1 x 3.0 cm partially cavitary nodule/mass within the right lung apex (image 13, series 3). The operative site was prepped and draped in the usual sterile fashion. Under sterile conditions and local anesthesia, a 17 gauge coaxial needle was advanced into the peripheral aspect of the nodule. Positioning was confirmed with intermittent CT fluoroscopy and followed by the acquisition of 4 core needle biopsies with an 18 gauge core needle biopsy device. Superficial hemostasis was achieved with manual compression. Limited post procedural chest CT was negative for pneumothorax or additional  complication. A dressing was placed. The patient tolerated the procedure well without immediate postprocedural complication. The patient was escorted to have an upright chest radiograph. IMPRESSION: Technically successful CT guided core needle core biopsy of hypermetabolic right upper lobe pulmonary nodule/mass. Electronically Signed   By: Sandi Mariscal M.D.   On: 09/24/2017 15:40   Dg Chest Port 1 View  Result Date: 09/24/2017 CLINICAL DATA:  Status post biopsy right lung. EXAM: PORTABLE CHEST 1 VIEW COMPARISON:  CT 09/24/2017. FINDINGS: Mediastinum and hilar structures normal. Cardiomegaly with normal pulmonary vascularity. Right upper lobe mass again noted. No pleural effusion. No pneumothorax post biopsy. No acute bony abnormality . IMPRESSION: No pneumothorax post biopsy. 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  PLAN: I had a lengthy discussion with the patient and her family today about her current disease status and further treatment options. We will wait for the final tissue diagnosis for confirmation of her malignancy and recommendation regarding her treatment. After the final pathology is consistent with non-small cell lung cancer, the patient would benefit from stereotactic radiotherapy to the solitary brain metastasis.  This could be followed by surgical resection of the solitary tumor in the lung or stereotactic radiotherapy to the lung mass too followed by systemic therapy. We will consider sending the tissue biopsy for molecular studies and PDL 1 expression if it is consistent with adenocarcinoma. I will arrange for the patient to come back for follow-up visit in 2-3 weeks for  reevaluation after the molecular studies becomes available and also after evaluation by cardiothoracic surgery. The patient will continue on a taper dose of  Decadron for the vasogenic edema. She was advised to call immediately if she has any concerning symptoms in the interval. The patient voices understanding of current disease status and treatment options and is in agreement with the current care plan.  All questions were answered. The patient knows to call the clinic with any problems, questions or concerns. We can certainly see the patient much sooner if necessary.  Thank you so much for allowing me to participate in the care of [name]. I will continue to follow up the patient with you and assist in her care.  I spent 40 minutes counseling the patient face to face. The total time spent in the appointment was 60 minutes.  Disclaimer: This note was dictated with voice recognition software. Similar sounding words can inadvertently be transcribed and [place] not be corrected upon review.   Eilleen Kempf September 28, 2017, 3:01 PM

## 2017-09-28 NOTE — Telephone Encounter (Signed)
Scheduled appt per 11/6 los - Gave patient AVS and calender per los.

## 2017-09-29 DIAGNOSIS — R918 Other nonspecific abnormal finding of lung field: Secondary | ICD-10-CM | POA: Diagnosis not present

## 2017-09-30 ENCOUNTER — Encounter: Payer: Self-pay | Admitting: *Deleted

## 2017-09-30 ENCOUNTER — Ambulatory Visit
Admission: RE | Admit: 2017-09-30 | Discharge: 2017-09-30 | Disposition: A | Discharge status: Home / Self Care | Payer: Medicare HMO | Source: Ambulatory Visit | Attending: Radiation Oncology | Admitting: Radiation Oncology

## 2017-09-30 ENCOUNTER — Other Ambulatory Visit: Payer: Self-pay | Admitting: *Deleted

## 2017-09-30 ENCOUNTER — Encounter: Payer: Self-pay | Admitting: Radiation Oncology

## 2017-09-30 VITALS — BP 167/71 | HR 73 | Temp 98.3°F | Resp 20

## 2017-09-30 DIAGNOSIS — R918 Other nonspecific abnormal finding of lung field: Secondary | ICD-10-CM | POA: Diagnosis not present

## 2017-09-30 DIAGNOSIS — C7931 Secondary malignant neoplasm of brain: Secondary | ICD-10-CM

## 2017-09-30 NOTE — Progress Notes (Signed)
Oncology Nurse Navigator Documentation  Oncology Nurse Navigator Flowsheets 09/30/2017  Navigator Location [facility]  Navigator Encounter Type Other/I received a call from rad onc. They were wondering about next steps for [name]. I spoke with Dr. Julien Nordmann. He states patient needs to see T surgery for possible resection. I notified rad onc. I then notified T surgery office of needed appt with Dr. Roxan Hockey.   Barriers/Navigation Needs Coordination of Care  Interventions Coordination of Care  Coordination of Care Other  Acuity Level 2  Time Spent with Patient 15

## 2017-09-30 NOTE — Progress Notes (Signed)
S/P SRS Brain, patient ambulated to room 1 at nursing, steady gait, denys pain,nausea, light headed, no blurry vision, stated"this was al right, I feel much better today', offered [employer], call bell with in reach, no driving today, monitor 30 minutes, call for any uinusual side effects not normal for you, ie: profuse vomiting, increase head ache not relieved with medication, vision changes not normal for you fever >101,patient gave verbal understanding 4:27 PM

## 2017-10-02 ENCOUNTER — Inpatient Hospital Stay (HOSPITAL_COMMUNITY)
Admission: EM | Admit: 2017-10-02 | Discharge: 2017-10-11 | DRG: 100 | Disposition: A | Discharge status: Rehabilitation Facility | Payer: Medicare HMO | Attending: Cardiovascular Disease | Admitting: Cardiovascular Disease

## 2017-10-02 ENCOUNTER — Encounter (HOSPITAL_COMMUNITY): Payer: Self-pay | Admitting: Emergency Medicine

## 2017-10-02 ENCOUNTER — Other Ambulatory Visit: Payer: Self-pay

## 2017-10-02 ENCOUNTER — Emergency Department (HOSPITAL_COMMUNITY): Payer: Medicare HMO

## 2017-10-02 DIAGNOSIS — J440 Chronic obstructive pulmonary disease with acute lower respiratory infection: Secondary | ICD-10-CM | POA: Diagnosis not present

## 2017-10-02 DIAGNOSIS — C7931 Secondary malignant neoplasm of brain: Secondary | ICD-10-CM

## 2017-10-02 DIAGNOSIS — R05 Cough: Secondary | ICD-10-CM | POA: Diagnosis not present

## 2017-10-02 DIAGNOSIS — G939 Disorder of brain, unspecified: Secondary | ICD-10-CM | POA: Diagnosis not present

## 2017-10-02 DIAGNOSIS — N183 Chronic kidney disease, stage 3 unspecified: Secondary | ICD-10-CM

## 2017-10-02 DIAGNOSIS — Z298 Encounter for other specified prophylactic measures: Secondary | ICD-10-CM

## 2017-10-02 DIAGNOSIS — K5901 Slow transit constipation: Secondary | ICD-10-CM | POA: Diagnosis not present

## 2017-10-02 DIAGNOSIS — K219 Gastro-esophageal reflux disease without esophagitis: Secondary | ICD-10-CM | POA: Diagnosis present

## 2017-10-02 DIAGNOSIS — M7989 Other specified soft tissue disorders: Secondary | ICD-10-CM | POA: Diagnosis not present

## 2017-10-02 DIAGNOSIS — G8191 Hemiplegia, unspecified affecting right dominant side: Secondary | ICD-10-CM | POA: Diagnosis not present

## 2017-10-02 DIAGNOSIS — Z6836 Body mass index (BMI) 36.0-36.9, adult: Secondary | ICD-10-CM

## 2017-10-02 DIAGNOSIS — D72829 Elevated white blood cell count, unspecified: Secondary | ICD-10-CM | POA: Diagnosis not present

## 2017-10-02 DIAGNOSIS — E669 Obesity, unspecified: Secondary | ICD-10-CM | POA: Diagnosis present

## 2017-10-02 DIAGNOSIS — M62838 Other muscle spasm: Secondary | ICD-10-CM | POA: Diagnosis not present

## 2017-10-02 DIAGNOSIS — J209 Acute bronchitis, unspecified: Secondary | ICD-10-CM | POA: Diagnosis not present

## 2017-10-02 DIAGNOSIS — Z720 Tobacco use: Secondary | ICD-10-CM | POA: Diagnosis not present

## 2017-10-02 DIAGNOSIS — G40909 Epilepsy, unspecified, not intractable, without status epilepticus: Secondary | ICD-10-CM | POA: Diagnosis present

## 2017-10-02 DIAGNOSIS — F5102 Adjustment insomnia: Secondary | ICD-10-CM | POA: Diagnosis not present

## 2017-10-02 DIAGNOSIS — Z2989 Encounter for other specified prophylactic measures: Secondary | ICD-10-CM

## 2017-10-02 DIAGNOSIS — I129 Hypertensive chronic kidney disease with stage 1 through stage 4 chronic kidney disease, or unspecified chronic kidney disease: Secondary | ICD-10-CM | POA: Diagnosis present

## 2017-10-02 DIAGNOSIS — R569 Unspecified convulsions: Secondary | ICD-10-CM

## 2017-10-02 DIAGNOSIS — E871 Hypo-osmolality and hyponatremia: Secondary | ICD-10-CM

## 2017-10-02 DIAGNOSIS — M25519 Pain in unspecified shoulder: Secondary | ICD-10-CM

## 2017-10-02 DIAGNOSIS — N182 Chronic kidney disease, stage 2 (mild): Secondary | ICD-10-CM | POA: Diagnosis present

## 2017-10-02 DIAGNOSIS — C3492 Malignant neoplasm of unspecified part of left bronchus or lung: Secondary | ICD-10-CM | POA: Diagnosis not present

## 2017-10-02 DIAGNOSIS — E1142 Type 2 diabetes mellitus with diabetic polyneuropathy: Secondary | ICD-10-CM

## 2017-10-02 DIAGNOSIS — J449 Chronic obstructive pulmonary disease, unspecified: Secondary | ICD-10-CM | POA: Diagnosis present

## 2017-10-02 DIAGNOSIS — Z87891 Personal history of nicotine dependence: Secondary | ICD-10-CM

## 2017-10-02 DIAGNOSIS — E1122 Type 2 diabetes mellitus with diabetic chronic kidney disease: Secondary | ICD-10-CM | POA: Diagnosis present

## 2017-10-02 DIAGNOSIS — E785 Hyperlipidemia, unspecified: Secondary | ICD-10-CM | POA: Diagnosis not present

## 2017-10-02 DIAGNOSIS — E1136 Type 2 diabetes mellitus with diabetic cataract: Secondary | ICD-10-CM | POA: Diagnosis present

## 2017-10-02 DIAGNOSIS — E1169 Type 2 diabetes mellitus with other specified complication: Secondary | ICD-10-CM | POA: Diagnosis not present

## 2017-10-02 DIAGNOSIS — R7309 Other abnormal glucose: Secondary | ICD-10-CM | POA: Diagnosis not present

## 2017-10-02 DIAGNOSIS — M1991 Primary osteoarthritis, unspecified site: Secondary | ICD-10-CM | POA: Diagnosis present

## 2017-10-02 DIAGNOSIS — Z79899 Other long term (current) drug therapy: Secondary | ICD-10-CM | POA: Diagnosis not present

## 2017-10-02 DIAGNOSIS — I10 Essential (primary) hypertension: Secondary | ICD-10-CM | POA: Diagnosis not present

## 2017-10-02 DIAGNOSIS — E46 Unspecified protein-calorie malnutrition: Secondary | ICD-10-CM | POA: Diagnosis not present

## 2017-10-02 DIAGNOSIS — Z85118 Personal history of other malignant neoplasm of bronchus and lung: Secondary | ICD-10-CM | POA: Diagnosis not present

## 2017-10-02 DIAGNOSIS — F419 Anxiety disorder, unspecified: Secondary | ICD-10-CM | POA: Diagnosis present

## 2017-10-02 DIAGNOSIS — G936 Cerebral edema: Secondary | ICD-10-CM | POA: Diagnosis not present

## 2017-10-02 DIAGNOSIS — Z7952 Long term (current) use of systemic steroids: Secondary | ICD-10-CM | POA: Diagnosis not present

## 2017-10-02 HISTORY — DX: Malignant (primary) neoplasm, unspecified: C80.1

## 2017-10-02 LAB — COMPREHENSIVE METABOLIC PANEL
ALT: 13 U/L — ABNORMAL LOW (ref 14–54)
ANION GAP: 13 (ref 5–15)
AST: 21 U/L (ref 15–41)
Albumin: 3.5 g/dL (ref 3.5–5.0)
Alkaline Phosphatase: 98 U/L (ref 38–126)
BUN: 25 mg/dL — ABNORMAL HIGH (ref 6–20)
CHLORIDE: 93 mmol/L — AB (ref 101–111)
CO2: 24 mmol/L (ref 22–32)
Calcium: 9.5 mg/dL (ref 8.9–10.3)
Creatinine, Ser: 1.28 mg/dL — ABNORMAL HIGH (ref 0.44–1.00)
GFR, EST AFRICAN AMERICAN: 48 mL/min — AB (ref >60)
GFR, EST NON AFRICAN AMERICAN: 41 mL/min — AB (ref >60)
Glucose, Bld: 235 mg/dL — ABNORMAL HIGH (ref 65–99)
POTASSIUM: 4.2 mmol/L (ref 3.5–5.1)
SODIUM: 130 mmol/L — AB (ref 135–145)
TOTAL PROTEIN: 6.8 g/dL (ref 6.5–8.1)
Total Bilirubin: 0.5 mg/dL (ref 0.3–1.2)

## 2017-10-02 LAB — DIFFERENTIAL
BASOS PCT: 0 %
Basophils Absolute: 0 10*3/uL (ref 0.0–0.1)
EOS ABS: 0.2 10*3/uL (ref 0.0–0.7)
Eosinophils Relative: 2 %
Lymphocytes Relative: 30 %
Lymphs Abs: 3.5 10*3/uL (ref 0.7–4.0)
MONO ABS: 0.8 10*3/uL (ref 0.1–1.0)
MONOS PCT: 7 %
Neutro Abs: 7.1 10*3/uL (ref 1.7–7.7)
Neutrophils Relative %: 61 %

## 2017-10-02 LAB — URINALYSIS, ROUTINE W REFLEX MICROSCOPIC
Bilirubin Urine: NEGATIVE
GLUCOSE, UA: NEGATIVE mg/dL
KETONES UR: NEGATIVE mg/dL
LEUKOCYTES UA: NEGATIVE
Nitrite: NEGATIVE
PH: 5 (ref 5.0–8.0)
Protein, ur: 100 mg/dL — AB
Specific Gravity, Urine: 1.017 (ref 1.005–1.030)

## 2017-10-02 LAB — GLUCOSE, CAPILLARY
Glucose-Capillary: 225 mg/dL — ABNORMAL HIGH (ref 65–99)
Glucose-Capillary: 439 mg/dL — ABNORMAL HIGH (ref 65–99)
Glucose-Capillary: 304 mg/dL — ABNORMAL HIGH (ref 65–99)

## 2017-10-02 LAB — I-STAT TROPONIN, ED: TROPONIN I, POC: 0.01 ng/mL (ref 0.00–0.08)

## 2017-10-02 LAB — CBC
HEMATOCRIT: 42.1 % (ref 36.0–46.0)
Hemoglobin: 14.8 g/dL (ref 12.0–15.0)
MCH: 30.7 pg (ref 26.0–34.0)
MCHC: 35.2 g/dL (ref 30.0–36.0)
MCV: 87.3 fL (ref 78.0–100.0)
PLATELETS: 301 10*3/uL (ref 150–400)
RBC: 4.82 MIL/uL (ref 3.87–5.11)
RDW: 13.2 % (ref 11.5–15.5)
WBC: 11.6 10*3/uL — AB (ref 4.0–10.5)

## 2017-10-02 LAB — PROTIME-INR
INR: 0.87
PROTHROMBIN TIME: 11.8 s (ref 11.4–15.2)

## 2017-10-02 LAB — I-STAT CHEM 8, ED
BUN: 27 mg/dL — ABNORMAL HIGH (ref 6–20)
CALCIUM ION: 1.15 mmol/L (ref 1.15–1.40)
CHLORIDE: 92 mmol/L — AB (ref 101–111)
Creatinine, Ser: 1.1 mg/dL — ABNORMAL HIGH (ref 0.44–1.00)
GLUCOSE: 243 mg/dL — AB (ref 65–99)
HCT: 46 % (ref 36.0–46.0)
HEMOGLOBIN: 15.6 g/dL — AB (ref 12.0–15.0)
POTASSIUM: 4.1 mmol/L (ref 3.5–5.1)
SODIUM: 130 mmol/L — AB (ref 135–145)
TCO2: 26 mmol/L (ref 22–32)

## 2017-10-02 LAB — HEMOGLOBIN A1C
Hgb A1c MFr Bld: 8.1 % — ABNORMAL HIGH (ref 4.8–5.6)
MEAN PLASMA GLUCOSE: 185.77 mg/dL

## 2017-10-02 LAB — RAPID URINE DRUG SCREEN, HOSP PERFORMED
Amphetamines: NOT DETECTED
BARBITURATES: NOT DETECTED
BENZODIAZEPINES: NOT DETECTED
Cocaine: NOT DETECTED
Opiates: NOT DETECTED
Tetrahydrocannabinol: NOT DETECTED

## 2017-10-02 LAB — ETHANOL

## 2017-10-02 LAB — APTT: APTT: 24 s (ref 24–36)

## 2017-10-02 MED ORDER — LINAGLIPTIN 5 MG PO TABS
5.0000 mg | ORAL_TABLET | Freq: Every day | ORAL | Status: DC
  Administered 2017-10-03 – 2017-10-11 (×9): 5 mg via ORAL
  Filled 2017-10-02 (×9): qty 1

## 2017-10-02 MED ORDER — WHITE PETROLATUM EX OINT
TOPICAL_OINTMENT | CUTANEOUS | Status: AC
  Administered 2017-10-02: 18:00:00
  Filled 2017-10-02: qty 28.35

## 2017-10-02 MED ORDER — MECLIZINE HCL 12.5 MG PO TABS: 25.0000 mg | ORAL_TABLET | Freq: Every day | ORAL | Status: DC | PRN

## 2017-10-02 MED ORDER — ALPRAZOLAM 0.25 MG PO TABS
0.2500 mg | ORAL_TABLET | Freq: Every day | ORAL | Status: DC | PRN
  Administered 2017-10-03 – 2017-10-11 (×7): 0.25 mg via ORAL
  Filled 2017-10-02 (×7): qty 1

## 2017-10-02 MED ORDER — INSULIN ASPART 100 UNIT/ML ~~LOC~~ SOLN
4.0000 [IU] | Freq: Three times a day (TID) | SUBCUTANEOUS | Status: DC
  Administered 2017-10-02 – 2017-10-11 (×24): 4 [IU] via SUBCUTANEOUS

## 2017-10-02 MED ORDER — INSULIN ASPART 100 UNIT/ML ~~LOC~~ SOLN
0.0000 [IU] | Freq: Three times a day (TID) | SUBCUTANEOUS | Status: DC
Start: 2017-10-02 — End: 2017-10-11
  Administered 2017-10-02: 20 [IU] via SUBCUTANEOUS
  Administered 2017-10-03: 4 [IU] via SUBCUTANEOUS
  Administered 2017-10-03: 11 [IU] via SUBCUTANEOUS
  Administered 2017-10-04: 7 [IU] via SUBCUTANEOUS
  Administered 2017-10-05: 3 [IU] via SUBCUTANEOUS
  Administered 2017-10-05: 4 [IU] via SUBCUTANEOUS
  Administered 2017-10-06: 7 [IU] via SUBCUTANEOUS
  Administered 2017-10-06: 3 [IU] via SUBCUTANEOUS
  Administered 2017-10-07 – 2017-10-08 (×3): 4 [IU] via SUBCUTANEOUS
  Administered 2017-10-08 (×2): 3 [IU] via SUBCUTANEOUS
  Administered 2017-10-09: 11 [IU] via SUBCUTANEOUS
  Administered 2017-10-09: 20 [IU] via SUBCUTANEOUS
  Administered 2017-10-09 – 2017-10-10 (×2): 7 [IU] via SUBCUTANEOUS
  Administered 2017-10-10: 15 [IU] via SUBCUTANEOUS
  Administered 2017-10-10 – 2017-10-11 (×3): 11 [IU] via SUBCUTANEOUS

## 2017-10-02 MED ORDER — METOPROLOL SUCCINATE ER 25 MG PO TB24
25.0000 mg | ORAL_TABLET | Freq: Every day | ORAL | Status: DC
  Administered 2017-10-03 – 2017-10-11 (×9): 25 mg via ORAL
  Filled 2017-10-02 (×9): qty 1

## 2017-10-02 MED ORDER — ONDANSETRON 4 MG PO TBDP: 4.0000 mg | ORAL_TABLET | Freq: Three times a day (TID) | ORAL | Status: DC | PRN

## 2017-10-02 MED ORDER — METFORMIN HCL 500 MG PO TABS
500.0000 mg | ORAL_TABLET | Freq: Every day | ORAL | Status: DC
  Administered 2017-10-03 – 2017-10-11 (×9): 500 mg via ORAL
  Filled 2017-10-02 (×9): qty 1

## 2017-10-02 MED ORDER — SODIUM CHLORIDE 0.9 % IV SOLN
750.0000 mg | Freq: Two times a day (BID) | INTRAVENOUS | Status: DC
  Administered 2017-10-02 – 2017-10-06 (×8): 750 mg via INTRAVENOUS
  Filled 2017-10-02 (×10): qty 7.5

## 2017-10-02 MED ORDER — INSULIN ASPART 100 UNIT/ML ~~LOC~~ SOLN: 0.0000 [IU] | Freq: Three times a day (TID) | SUBCUTANEOUS | Status: DC

## 2017-10-02 MED ORDER — SODIUM CHLORIDE 0.9 % IV SOLN
1000.0000 mg | Freq: Once | INTRAVENOUS | Status: AC
  Administered 2017-10-02: 1000 mg via INTRAVENOUS
  Filled 2017-10-02: qty 10

## 2017-10-02 MED ORDER — ALBUTEROL SULFATE (2.5 MG/3ML) 0.083% IN NEBU
2.5000 mg | INHALATION_SOLUTION | Freq: Four times a day (QID) | RESPIRATORY_TRACT | Status: DC | PRN
  Administered 2017-10-11: 2.5 mg via RESPIRATORY_TRACT
  Filled 2017-10-02: qty 3

## 2017-10-02 MED ORDER — HYDRALAZINE HCL 50 MG PO TABS
50.0000 mg | ORAL_TABLET | Freq: Two times a day (BID) | ORAL | Status: DC
  Administered 2017-10-02 – 2017-10-04 (×4): 50 mg via ORAL
  Filled 2017-10-02 (×5): qty 1

## 2017-10-02 MED ORDER — DEXAMETHASONE 4 MG PO TABS: 4.0000 mg | ORAL_TABLET | Freq: Three times a day (TID) | ORAL | Status: DC

## 2017-10-02 MED ORDER — UMECLIDINIUM BROMIDE 62.5 MCG/INH IN AEPB
1.0000 | INHALATION_SPRAY | Freq: Every day | RESPIRATORY_TRACT | Status: DC
  Administered 2017-10-03 – 2017-10-10 (×8): 1 via RESPIRATORY_TRACT
  Filled 2017-10-02 (×2): qty 7

## 2017-10-02 MED ORDER — INSULIN GLARGINE 100 UNIT/ML ~~LOC~~ SOLN
10.0000 [IU] | Freq: Every day | SUBCUTANEOUS | Status: DC
  Administered 2017-10-02: 10 [IU] via SUBCUTANEOUS
  Filled 2017-10-02 (×2): qty 0.1

## 2017-10-02 MED ORDER — GABAPENTIN 100 MG PO CAPS
100.0000 mg | ORAL_CAPSULE | Freq: Every day | ORAL | Status: DC | PRN
  Administered 2017-10-06: 100 mg via ORAL
  Filled 2017-10-02: qty 1

## 2017-10-02 MED ORDER — LORAZEPAM 2 MG/ML IJ SOLN
1.0000 mg | Freq: Once | INTRAMUSCULAR | Status: AC
  Administered 2017-10-02: 1 mg via INTRAVENOUS
  Filled 2017-10-02: qty 1

## 2017-10-02 MED ORDER — HYDROCODONE-ACETAMINOPHEN 5-325 MG PO TABS
1.0000 | ORAL_TABLET | ORAL | Status: DC | PRN
  Administered 2017-10-02 – 2017-10-09 (×15): 1 via ORAL
  Filled 2017-10-02 (×17): qty 1

## 2017-10-02 MED ORDER — INSULIN ASPART 100 UNIT/ML ~~LOC~~ SOLN
4.0000 [IU] | Freq: Three times a day (TID) | SUBCUTANEOUS | Status: DC
Start: 2017-10-03 — End: 2017-10-02

## 2017-10-02 MED ORDER — LEVETIRACETAM 500 MG/5ML IV SOLN: 500.0000 mg | Freq: Two times a day (BID) | INTRAVENOUS | Status: DC

## 2017-10-02 MED ORDER — AMLODIPINE BESYLATE 5 MG PO TABS
5.0000 mg | ORAL_TABLET | Freq: Two times a day (BID) | ORAL | Status: DC
  Administered 2017-10-02 – 2017-10-10 (×13): 5 mg via ORAL
  Filled 2017-10-02 (×16): qty 1

## 2017-10-02 MED ORDER — SODIUM CHLORIDE 0.9 % IV SOLN
INTRAVENOUS | Status: DC
  Administered 2017-10-02 (×2): via INTRAVENOUS

## 2017-10-02 MED ORDER — LORATADINE 10 MG PO TABS
10.0000 mg | ORAL_TABLET | Freq: Every day | ORAL | Status: DC | PRN
Start: 2017-10-02 — End: 2017-10-11
  Administered 2017-10-03: 10 mg via ORAL
  Filled 2017-10-02: qty 1

## 2017-10-02 MED ORDER — ROSUVASTATIN CALCIUM 5 MG PO TABS
10.0000 mg | ORAL_TABLET | Freq: Every day | ORAL | Status: DC
  Administered 2017-10-02 – 2017-10-10 (×9): 10 mg via ORAL
  Filled 2017-10-02 (×9): qty 2

## 2017-10-02 MED ORDER — DEXAMETHASONE SODIUM PHOSPHATE 10 MG/ML IJ SOLN
10.0000 mg | Freq: Once | INTRAMUSCULAR | Status: AC
  Administered 2017-10-02: 10 mg via INTRAVENOUS
  Filled 2017-10-02: qty 1

## 2017-10-02 MED ORDER — ALBUTEROL SULFATE HFA 108 (90 BASE) MCG/ACT IN AERS: 2.0000 | INHALATION_SPRAY | Freq: Four times a day (QID) | RESPIRATORY_TRACT | Status: DC | PRN

## 2017-10-02 MED ORDER — POTASSIUM CHLORIDE CRYS ER 10 MEQ PO TBCR
10.0000 meq | EXTENDED_RELEASE_TABLET | Freq: Two times a day (BID) | ORAL | Status: DC
  Administered 2017-10-02 – 2017-10-04 (×5): 10 meq via ORAL
  Filled 2017-10-02 (×5): qty 1

## 2017-10-02 MED ORDER — PANTOPRAZOLE SODIUM 40 MG PO TBEC
40.0000 mg | DELAYED_RELEASE_TABLET | Freq: Every day | ORAL | Status: DC
  Administered 2017-10-03 – 2017-10-11 (×9): 40 mg via ORAL
  Filled 2017-10-02 (×9): qty 1

## 2017-10-02 MED ORDER — SITAGLIPTIN PHOS-METFORMIN HCL 50-500 MG PO TABS: 1.0000 | ORAL_TABLET | Freq: Every day | ORAL | Status: DC

## 2017-10-02 NOTE — H&P (Addendum)
Referring Physician:  Ahaana F Walsh is an 71 y.o. female.                       Chief Complaint: New onset RUE trembling and RUE and RLE weakness  HPI: 71 year old female with recent diagnosis of RUL lung  Nodule with metastatic lesion in left cerebral hemisphere has new onset of RUE becoming heavy and weak. CT head shows new 11 mm hemorrhagic metastatic deposit in left frontal lobe with surrounding edema. She is being loaded with IV keppra after 1 mg. IV ativan per Neurology. She is being admitted for further work up. She claims to be feeling little better.  Past Medical History:  Diagnosis Date  . Anxiety   . Arthritis   . Cancer ([place])   . Chronic back pain greater than 3 months duration   . Chronic neck pain   . COPD (chronic obstructive pulmonary disease) ([place])   . Cough   . Diabetes mellitus   . Dyspnea   . GERD (gastroesophageal reflux disease)   . H/O hiatal hernia   . Headache(784.0)   . Hemorrhoids   . Herniated disc   . History of kidney stones   . Hx of echocardiogram 2010   normal EF  . Hypertension   . Palpitations   . Shortness of breath on exertion 11/10/11   "sometimes"  . Vertigo   . Vertigo       Past Surgical History:  Procedure Laterality Date  . ABDOMINAL HYSTERECTOMY  1997?   "partial"  . CHOLECYSTECTOMY  ~ 2008  . COLONOSCOPY    . EYE SURGERY     cateracts  . FRACTURE SURGERY  2004?   right shoulder  . KNEE ARTHROSCOPY  2004   right  . KNEE SURGERY     right  . neck fusion    . SHOULDER OPEN ROTATOR CUFF REPAIR  07/20/2011   left  . TONSILLECTOMY      Family History  Problem Relation Age of Onset  . Lung cancer Sister    Social History:  reports that she quit smoking about 2 months ago. Her smoking use included cigarettes. She has a 7.50 pack-year smoking history. she has never used smokeless tobacco. She reports that she does not drink alcohol or use drugs.  Allergies:  Allergies  Allergen Reactions  . Oxycodone Hives and Itching      (Not in a hospital admission)  Results for orders placed or performed during the hospital encounter of 10/02/17 (from the past 48 hour(s))  Ethanol     Status: None   Collection Time: 10/02/17  6:34 AM  Result Value Ref Range   Alcohol, Ethyl (B) <10 <10 mg/dL    Comment:        LOWEST DETECTABLE LIMIT FOR SERUM ALCOHOL IS 10 mg/dL FOR MEDICAL PURPOSES ONLY   Protime-INR     Status: None   Collection Time: 10/02/17  6:34 AM  Result Value Ref Range   Prothrombin Time 11.8 11.4 - 15.2 seconds   INR 0.87   APTT     Status: None   Collection Time: 10/02/17  6:34 AM  Result Value Ref Range   aPTT 24 24 - 36 seconds  CBC     Status: Abnormal   Collection Time: 10/02/17  6:34 AM  Result Value Ref Range   WBC 11.6 (H) 4.0 - 10.5 K/uL   RBC 4.82 3.87 - 5.11 MIL/uL   Hemoglobin  14.8 12.0 - 15.0 g/dL   HCT 42.1 36.0 - 46.0 %   MCV 87.3 78.0 - 100.0 fL   MCH 30.7 26.0 - 34.0 pg   MCHC 35.2 30.0 - 36.0 g/dL   RDW 13.2 11.5 - 15.5 %   Platelets 301 150 - 400 K/uL  Differential     Status: None   Collection Time: 10/02/17  6:34 AM  Result Value Ref Range   Neutrophils Relative % 61 %   Neutro Abs 7.1 1.7 - 7.7 K/uL   Lymphocytes Relative 30 %   Lymphs Abs 3.5 0.7 - 4.0 K/uL   Monocytes Relative 7 %   Monocytes Absolute 0.8 0.1 - 1.0 K/uL   Eosinophils Relative 2 %   Eosinophils Absolute 0.2 0.0 - 0.7 K/uL   Basophils Relative 0 %   Basophils Absolute 0.0 0.0 - 0.1 K/uL  Comprehensive metabolic panel     Status: Abnormal   Collection Time: 10/02/17  6:34 AM  Result Value Ref Range   Sodium 130 (L) 135 - 145 mmol/L   Potassium 4.2 3.5 - 5.1 mmol/L   Chloride 93 (L) 101 - 111 mmol/L   CO2 24 22 - 32 mmol/L   Glucose, Bld 235 (H) 65 - 99 mg/dL   BUN 25 (H) 6 - 20 mg/dL   Creatinine, Ser 1.28 (H) 0.44 - 1.00 mg/dL   Calcium 9.5 8.9 - 10.3 mg/dL   Total Protein 6.8 6.5 - 8.1 g/dL   Albumin 3.5 3.5 - 5.0 g/dL   AST 21 15 - 41 U/L   ALT 13 (L) 14 - 54 U/L   Alkaline  Phosphatase 98 38 - 126 U/L   Total Bilirubin 0.5 0.3 - 1.2 mg/dL   GFR calc non Af Amer 41 (L) >60 mL/min   GFR calc Af Amer 48 (L) >60 mL/min    Comment: (NOTE) The eGFR has been calculated using the CKD EPI equation. This calculation has not been validated in all clinical situations. eGFR's persistently <60 mL/min signify possible Chronic Kidney Disease.    Anion gap 13 5 - 15  I-stat troponin, ED     Status: None   Collection Time: 10/02/17  6:38 AM  Result Value Ref Range   Troponin i, poc 0.01 0.00 - 0.08 ng/mL   Comment 3            Comment: Due to the release kinetics of cTnI, a negative result within the first hours of the onset of symptoms does not rule out myocardial infarction with certainty. If myocardial infarction is still suspected, repeat the test at appropriate intervals.   I-Stat Chem 8, ED     Status: Abnormal   Collection Time: 10/02/17  6:40 AM  Result Value Ref Range   Sodium 130 (L) 135 - 145 mmol/L   Potassium 4.1 3.5 - 5.1 mmol/L   Chloride 92 (L) 101 - 111 mmol/L   BUN 27 (H) 6 - 20 mg/dL   Creatinine, Ser 1.10 (H) 0.44 - 1.00 mg/dL   Glucose, Bld 243 (H) 65 - 99 mg/dL   Calcium, Ion 1.15 1.15 - 1.40 mmol/L   TCO2 26 22 - 32 mmol/L   Hemoglobin 15.6 (H) 12.0 - 15.0 g/dL   HCT 46.0 36.0 - 46.0 %   Ct Head Code Stroke Wo Contrast  Result Date: 10/02/2017 CLINICAL DATA:  Code stroke. Focal neuro deficit less than 6 hours. Right-sided weakness with twitching. Lung mass with brain lesions consistent with metastatic disease.  EXAM: CT HEAD WITHOUT CONTRAST TECHNIQUE: Contiguous axial images were obtained from the base of the skull through the vertex without intravenous contrast. COMPARISON:  MRI 09/23/2017.  CT 09/09/2017 FINDINGS: Brain: Vasogenic edema in the left frontal parietal lobe as noted on prior MRI. 11.5 mm hyperdense nodule within the edema compatible with hemorrhagic metastatic disease. This lesion shows enhancement on the prior study. No other  areas of hemorrhage or edema. Ventricle size normal. No acute ischemic infarction. Vascular: Negative for hyperdense vessel. Skull: Negative Sinuses/Orbits: Negative Other: None ASPECTS ([place] Stroke Program Early CT Score) - Ganglionic level infarction (caudate, lentiform nuclei, internal capsule, insula, M1-M3 cortex): 7 - Supraganglionic infarction (M4-M6 cortex): 3 Total score (0-10 with 10 being normal): 10 IMPRESSION: 1. 11 mm hemorrhagic metastatic deposit in the left frontal parietal lobe with surrounding edema. The hemorrhage is hyperdense and appears new compared with prior studies 2. ASPECTS is 10 These results were called by telephone at the time of interpretation on 10/02/2017 at 6:59 am to Dr. Cheral Marker, who verbally acknowledged these results. Electronically Signed   By: Franchot Gallo M.D.   On: 10/02/2017 06:59    Review Of Systems Constitutional: No fever, chills, weight loss or gain. Eyes: No vision change, wears glasses. No discharge or pain. Ears: No hearing loss, No tinnitus. Respiratory: No asthma, Positive COPD, pneumonias and shortness of breath. No hemoptysis. Cardiovascular: Positive chest pain, palpitation, leg edema. Gastrointestinal: No nausea, vomiting, diarrhea, constipation. No GI bleed. No hepatitis. Genitourinary: No dysuria, hematuria, kidney stone. No incontinance. Neurological: Positive headache, stroke, seizures.  Psychiatry: No psych facility admission for anxiety, depression, suicide. No detox. Skin: No rash. Musculoskeletal: Positive joint pain, neck pain, back pain. Lymphadenopathy: No lymphadenopathy. Hematology: No anemia or easy bruising.   Blood pressure (!) 151/84, pulse 89, temperature 98.8 F (37.1 C), temperature source Oral, resp. rate (!) 22, height _0  (1.702 m), weight 102.1 kg (225 lb), SpO2 100 %. Body mass index is 35.24 kg/m. General appearance: alert, cooperative, appears stated age and no distress Head: Normocephalic,  atraumatic. Eyes: Brown eyes, pink conjunctiva, corneas clear. PERRL, EOM's intact. Neck: No adenopathy, no carotid bruit, no JVD, supple, symmetrical, trachea midline and thyroid not enlarged. Resp: Clear to auscultation bilaterally. Cardio: Regular rate and rhythm, S1, S2 normal, II/VI systolic murmur, no click, rub or gallop GI: Soft, non-tender; bowel sounds normal; no organomegaly. Extremities: No edema, cyanosis or clubbing. Skin: Warm and dry.  Neurologic: Alert and oriented X 3, decreased RUE and RLE  strength.   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 Home medications  Roma Bierlein S, MD  10/02/2017, 8:31 AM

## 2017-10-02 NOTE — Progress Notes (Signed)
Notified Dr. Doylene Canard of need for pts medication orders and cbg results of 435. Per md, he will enter orders. Will cont to monitor.

## 2017-10-02 NOTE — ED Notes (Signed)
Paged Code STEMI

## 2017-10-02 NOTE — ED Notes (Addendum)
Code Stroke cancelled per Dr. Cheral Marker

## 2017-10-02 NOTE — Plan of Care (Signed)
  Progressing Education: Knowledge of General Education information will improve 10/02/2017 1129 - Progressing by Milderd Meager, RN Health Behavior/Discharge Planning: Ability to manage health-related needs will improve 10/02/2017 1129 - Progressing by Milderd Meager, RN Clinical Measurements: Ability to maintain clinical measurements within normal limits will improve 10/02/2017 1129 - Progressing by Milderd Meager, RN Will remain free from infection 10/02/2017 1129 - Progressing by Milderd Meager, RN Diagnostic test results will improve 10/02/2017 1129 - Progressing by Milderd Meager, RN Respiratory complications will improve 10/02/2017 1129 - Progressing by Milderd Meager, RN Cardiovascular complication will be avoided 10/02/2017 1129 - Progressing by Milderd Meager, RN Activity: Risk for activity intolerance will decrease 10/02/2017 1129 - Progressing by Milderd Meager, RN Nutrition: Adequate nutrition will be maintained 10/02/2017 1129 - Progressing by Milderd Meager, RN Coping: Level of anxiety will decrease 10/02/2017 1129 - Progressing by Milderd Meager, RN Elimination: Will not experience complications related to bowel motility 10/02/2017 1129 - Progressing by Milderd Meager, RN Will not experience complications related to urinary retention 10/02/2017 1129 - Progressing by Milderd Meager, RN Pain Managment: General experience of comfort will improve 10/02/2017 1129 - Progressing by Milderd Meager, RN Safety: Ability to remain free from injury will improve 10/02/2017 1129 - Progressing by Milderd Meager, RN Skin Integrity: Risk for impaired skin integrity will decrease 10/02/2017 1129 - Progressing by Milderd Meager, RN

## 2017-10-02 NOTE — ED Notes (Signed)
Attempted to call report

## 2017-10-02 NOTE — ED Triage Notes (Signed)
Patient got up at 4am was feeling her nromal, sitting on the couch and had severe pain in her back called her husband and he assisted her back to back approx. 5 am. States her right arm was trembling and weakness, c/o weakness in her right leg. Upon arrival to ed alert oriented. Dr. Leonides Schanz at bedside.  0638 taken to CT #2 Dr Cheral Marker in [place] Code stroke cancelled and patient returned to ED.

## 2017-10-02 NOTE — ED Notes (Signed)
Blood collected

## 2017-10-02 NOTE — ED Provider Notes (Signed)
TIME SEEN: 6:39 AM  CHIEF COMPLAINT: Right-sided weakness and numbness  HPI: Patient is a 71 year old female with history of hypertension, diabetes, previous tobacco use, diagnosis of lung cancer recently not currently on treatment, COPD who presents to the emergency department with complaints of right-sided arm and leg weakness and right arm numbness that started at 5 AM.  Reports that she was awake around 4 AM and felt fine and then around 5 AM she began to feel abnormal.  She states she is having a mild headache.  No recent bruit.  Not on blood thinners.  No chest pain or shortness of breath.  No recent fevers, cough, vomiting or diarrhea.  No previous history of CVA or TIA.  ROS: See HPI Constitutional: no fever  Eyes: no drainage  ENT: no runny nose   Cardiovascular:  no chest pain  Resp: no SOB  GI: no vomiting GU: no dysuria Integumentary: no rash  Allergy: no hives  Musculoskeletal: no leg swelling  Neurological: no slurred speech ROS otherwise negative  PAST MEDICAL HISTORY/PAST SURGICAL HISTORY:  Past Medical History:  Diagnosis Date  . Anxiety   . Arthritis   . Chronic back pain greater than 3 months duration   . Chronic neck pain   . COPD (chronic obstructive pulmonary disease) ([place])   . Cough   . Diabetes mellitus   . Dyspnea   . GERD (gastroesophageal reflux disease)   . H/O hiatal hernia   . Headache(784.0)   . Hemorrhoids   . Herniated disc   . History of kidney stones   . Hx of echocardiogram 2010   normal EF  . Hypertension   . Palpitations   . Shortness of breath on exertion 11/10/11   "sometimes"  . Vertigo   . Vertigo     MEDICATIONS:  Prior to Admission medications   Medication Sig Start Date End Date Taking? Authorizing Provider  albuterol (PROVENTIL) (2.5 MG/3ML) 0.083% nebulizer solution USE 1 VIAL EVERY 6 HOURS AS NEEDED FOR SHORTNESS OF BREATH 10/15/15   [provider]  albuterol (VENTOLIN HFA) 108 (90 Base) MCG/ACT inhaler  Inhale 2 puffs into the lungs every 6 (six) hours as needed for wheezing or shortness of breath.    [provider]  ALPRAZolam Duanne Moron) 0.25 MG tablet Take 0.25 mg by mouth daily as needed for anxiety.  10/16/14   [provider]  amLODipine (NORVASC) 5 MG tablet Take 5 mg by mouth 2 (two) times daily.     [provider]  B-D INS SYRINGE 0.5CC/31GX5/16 31G X 5/16" 0.5 ML MISC 2 (two) times daily. as directed 09/16/17   [provider]  cholecalciferol (VITAMIN D) 1000 units tablet Take 1,000 Units by mouth daily.    [provider]  cloNIDine (CATAPRES) 0.1 MG tablet Take 0.1 mg by mouth 2 (two) times daily as needed (high blood pressure).     [provider]  conjugated estrogens (PREMARIN) vaginal cream Place 0.5 Applicatorfuls vaginally at bedtime as needed.    [provider]  cyclobenzaprine (FLEXERIL) 10 MG tablet Take 10 mg by mouth at bedtime as needed for muscle spasms.    [provider]  esomeprazole (NEXIUM) 40 MG capsule Take daily by mouth. 08/18/17   [provider]  gabapentin (NEURONTIN) 100 MG capsule Take 100 mg by mouth daily as needed (for leg pain).  05/10/17   [provider]  hydrALAZINE (APRESOLINE) 50 MG tablet Take 50 mg by mouth 3 (three) times daily.  [provider]  INCRUSE ELLIPTA 62.5 MCG/INH AEPB TAKE 1 PUFF BY MOUTH EVERY DAY 08/18/17   [provider]  JANUMET 50-500 MG tablet Take 1 tablet by mouth daily. 03/27/17   [provider]  KLOR-CON M10 10 MEQ tablet Take 1 tablet (10 mEq total) by mouth daily. Patient taking differently: Take 10 mEq by mouth 2 (two) times daily.  07/07/17   Dixie Dials, MD  levETIRAcetam (KEPPRA) 500 MG tablet Take 1 tablet (500 mg total) by mouth 2 (two) times daily. 09/09/17 10/09/17  Charisse March, MD  loratadine (CLARITIN) 10 MG tablet Take 10 mg by mouth daily as needed for allergies.    [provider]   meclizine (ANTIVERT) 25 MG tablet Take 25 mg daily as needed by mouth. 06/23/17   [provider]  metoprolol succinate (TOPROL-XL) 25 MG 24 hr tablet Take 25 mg by mouth daily. 03/20/17   [provider]  ondansetron (ZOFRAN ODT) 4 MG disintegrating tablet Take 1 tablet (4 mg total) by mouth every 8 (eight) hours as needed for nausea or vomiting. Patient taking differently: Take 4 mg by mouth every 8 (eight) hours as needed for nausea or vomiting. DISSOLVE IN THE MOUTH 02/27/17   Duffy Bruce, MD  [facility] VERIO test strip  09/13/17   [provider]  predniSONE (DELTASONE) 10 MG tablet TAKE 1 TABLET BY MOUTH DAILY WITH BREAKFAST. AFTER 1 WEEK, TAKE 1/2 TABLET FOR 6 DAYS. 08/24/17   [provider]  rosuvastatin (CRESTOR) 10 MG tablet Take 10 mg by mouth daily.    [provider]    ALLERGIES:  Allergies  Allergen Reactions  . Oxycodone Hives and Itching    SOCIAL HISTORY:  Social History   Tobacco Use  . Smoking status: Former Smoker    Packs/day: 0.25    Years: 30.00    Pack years: 7.50    Types: Cigarettes    Last attempt to quit: 07/08/2017    Years since quitting: 0.2  . Smokeless tobacco: Never Used  . Tobacco comment: 4-5 cigs / day  Substance Use Topics  . Alcohol use: No    FAMILY HISTORY: Family History  Problem Relation Age of Onset  . Lung cancer Sister     EXAM: BP (!) 162/85   Pulse (!) 102   Temp 98.8 F (37.1 C) (Oral)   Resp 20   Ht 5\' 7"  (1.702 m)   Wt 102.1 kg (225 lb)   SpO2 100%   BMI 35.24 kg/m  CONSTITUTIONAL: Alert and oriented and responds appropriately to questions.  Elderly, obese, tearful HEAD: Normocephalic EYES: Conjunctivae clear, pupils appear equal, EOMI ENT: normal nose; moist mucous membranes NECK: Supple, no meningismus, no nuchal rigidity, no LAD  CARD: RRR; S1 and S2 appreciated; no murmurs, no clicks, no rubs, no gallops RESP: Normal chest excursion without splinting or tachypnea;  breath sounds clear and equal bilaterally; no wheezes, no rhonchi, no rales, no hypoxia or respiratory distress, speaking full sentences ABD/GI: Normal bowel sounds; non-distended; soft, non-tender, no rebound, no guarding, no peritoneal signs, no hepatosplenomegaly BACK:  The back appears normal and is non-tender to palpation, there is no CVA tenderness EXT: Normal ROM in all joints; non-tender to palpation; no edema; normal capillary refill; no cyanosis, no calf tenderness or swelling    SKIN: Normal color for age and race; warm; no rash NEURO: Patient has drift in the right upper extremity but is able to lift it against gravity, almost completely unable  to lift the right leg off the bed and has some drift with this leg as well, normal strength and no drift in the left upper and lower extremity, decreased sensation to light touch in the right arm but otherwise sensation to light touch is intact diffusely, cranial nerves II through XII intact, no dysarthria or aphasia, normal visual fields, no neglect, no dysmetria to finger-nose testing with the left arm.  Unable to test for ataxia with the right arm or leg secondary to weakness.  NIH stroke scale is 4. PSYCH: The patient's mood and manner are appropriate. Grooming and personal hygiene are appropriate.  MEDICAL DECISION MAKING: Patient here as a code stroke.  Last seen normal 5 AM.  NIH stroke scale is 4.  EKG shows sinus tachycardia but no arrhythmia.  She is not on antiplatelets or anticoagulants.  Will obtain labs, head CT.  Neurology has been consulted.  ED PROGRESS: Patient has documented history of brain metastasis in the L frontoparietal region.  Dr. Cheral Marker with neurology has canceled the code stroke.  Thinks this could have been a seizure.  Has ordered Ativan and Keppra.  7:20 AM  D/w Dr. Cheral Marker with neurology.  Appreciate his help and prompt response.  CT scan shows 11 mm hemorrhagic metastatic deposit in the left frontoparietal lobe with  surrounding edema but they state that this appears new compared to previous studies including an MRI on 09/23/17.  Neurology recommends giving 10 mg of IV Decadron given patient was symptomatic.  He is starting her on Keppra twice daily with a load of IV Keppra.  Recommends medicine admission.  Her PCP is Dr. Doylene Canard.  7:43 AM Discussed patient's case with Dr. Doylene Canard pt's PCP.  I have recommended admission and patient (and family if present) agree with this plan. Admitting physician will place admission orders.   I reviewed all nursing notes, vitals, pertinent previous records, EKGs, lab and urine results, imaging (as available).     EKG Interpretation  Date/Time:  Saturday October 02 2017 06:34:54 EST Ventricular Rate:  101 PR Interval:    QRS Duration: 76 QT Interval:  326 QTC Calculation: 423 R Axis:   -21 Text Interpretation:  Sinus tachycardia Borderline left axis deviation No significant change since last tracing other than rate is faster Confirmed by Jaylianna Tatlock, Cyril Mourning [phone]) on 10/02/2017 6:39:23 AM         Sujey Gundry, Delice Bison, DO 10/02/17 [id number]

## 2017-10-02 NOTE — Consult Note (Signed)
NEURO HOSPITALIST CONSULT NOTE   Requestig physician: Dr. Leonides Schanz  Reason for Consult: New onset RUE trembling and weakness  History obtained from:   Patient and Chart     HPI:                                                                                                                                          [name] is an 71 y.o. female with recent diagnosis of RUL lung nodule with metastatic lesion to her left cerebral hemisphere, who presents to the ED today after her RUE became heavy and weak in conjunction with RUE "flapping" movements and RLE trembling while watching TV at home at 4:30 AM this morning.   She was scheduled for VATS/right upper lobectomy on 10/18 but at that appointment stated that she had experienced RUE and RLE heaviness of sudden onset the day before, therefore surgery was canceled in order to further work up the etiology for her weakness. MRI Brain the same day revealed a 10 mm left posterior frontal mass with moderate vasogenic edema, likely a solitary metastasis. Follow up MRI brain on 11/1 revealed slight enlargement of the metastasis affecting the medial left frontoparietal junction, now measuring 9 x 12 x 12 mm, with regional vasogenic edema.  A note from 11/2 documents the following: "The patient was seen today regarding her diagnosis of a likely lung cancer with a solitary metastasis. The patient was about to undergo an excision for a right upper lobe mass but then began having some right-sided weakness which prompted further workup including an MRI scan of the brain. This showed a solitary metastasis measuring 1 cm in the left parasagittal frontal lobe. This has been reviewed in multidisciplinary brain conference and it was felt that the patient is a good candidate for radiosurgery to this lesion pending pathologic confirmation from the lung. The patient has seen cardiothoracic surgery and a urgent CT guided biopsy has been ordered. We  discussed with the patient today the role of radiosurgery. She has also seen neurosurgery and is comfortable with this plan. We will follow-up on her upcoming biopsy and will tentatively plan to proceed with radiosurgery in the near future. We will also follow the patient's management in terms of the lung tumor. It appears that she does not have extensive disease currently systemically. The patient will continue her current dose of steroids and a refill was given. Now scheduled for RUL mass biopsy"  A note from 11/8 documents the following: "S/P SRS Brain, patient ambulated to room 1 at nursing, steady gait, denys pain,nausea, light headed, no blurry vision, stated"this was al right, I feel much better today'."   CT head in the ED this AM reveals the following: 11 mm hemorrhagic metastatic deposit in the left frontal  parietal lobe with surrounding edema. The hemorrhage is hyperdense and appears new compared with prior studies.  Past Medical History:  Diagnosis Date  . Anxiety   . Arthritis   . Chronic back pain greater than 3 months duration   . Chronic neck pain   . COPD (chronic obstructive pulmonary disease) ([place])   . Cough   . Diabetes mellitus   . Dyspnea   . GERD (gastroesophageal reflux disease)   . H/O hiatal hernia   . Headache(784.0)   . Hemorrhoids   . Herniated disc   . History of kidney stones   . Hx of echocardiogram 2010   normal EF  . Hypertension   . Palpitations   . Shortness of breath on exertion 11/10/11   "sometimes"  . Vertigo   . Vertigo     Past Surgical History:  Procedure Laterality Date  . ABDOMINAL HYSTERECTOMY  1997?   "partial"  . CHOLECYSTECTOMY  ~ 2008  . COLONOSCOPY    . EYE SURGERY     cateracts  . FRACTURE SURGERY  2004?   right shoulder  . KNEE ARTHROSCOPY  2004   right  . KNEE SURGERY     right  . neck fusion    . SHOULDER OPEN ROTATOR CUFF REPAIR  07/20/2011   left  . TONSILLECTOMY      Family History  Problem Relation Age  of Onset  . Lung cancer Sister    Social History:  reports that she quit smoking about 2 months ago. Her smoking use included cigarettes. She has a 7.50 pack-year smoking history. she has never used smokeless tobacco. She reports that she does not drink alcohol or use drugs.  Allergies  Allergen Reactions  . Oxycodone Hives and Itching    HOME MEDICATIONS:                                                                                                                        ROS:                                                                                                                                       Has a mild headache. Denies SOB or CP. Other ROS as per HPI.   Blood pressure (!) 162/85, pulse (!) 102, temperature 98.8 F (37.1 C), temperature source Oral, resp. rate 20, height 5' 7"  (1.702 m), weight  102.1 kg (225 lb), SpO2 100 %.  General Examination:                                                                                                      HEENT-  [place]/AT   Lungs- Respirations unlabored Extremities- Warm and well perfused.   Neurological Examination Mental Status: Alert and oriented. Anxious affect. Speech fluent with intact comprehension. Naming and repetition intact.  Cranial Nerves: II:  Visual fields intact bilaterally. PERRL.  III,IV, VI: EOMI without nystagmus. No ptosis.  V,VII: No facial droop. Facial temp sensation normal bilaterally VIII: hearing intact to conversation IX,X: Palate rises symmetrically XI: Head at midline XII: midline tongue extension Motor: RUE: 4/5 deltoid, biceps and grip strength. Arm drifts to the right when held antigravity. Intermittent coarse tremor and cogwheel rigidity.  RLE: 4/5 proximal with 5/5 distal strength. Mildly increased tone. Occasional coarse tremor.  LUE and LLE: 5/5 Sensory: Temp and light touch intact x 4. No extinction with DSS.  Deep Tendon Reflexes: Hypoactive on the right in the context of increased  tone. 2+ on the left.  Plantars: Right: downgoing  Left: downgoing Cerebellar: No ataxia with FNF bilaterally. Slower movements on the right.  Gait: Deferred  Lab Results: Basic Metabolic Panel: Recent Labs  Lab 09/28/17 1355 10/02/17 0640  NA 131* 130*  K 4.5 4.1  CL  --  92*  CO2 23  --   GLUCOSE 248* 243*  BUN 25.7 27*  CREATININE 1.2* 1.10*  CALCIUM 9.1  --     Liver Function Tests: Recent Labs  Lab 09/28/17 1355  AST 11  ALT 12  ALKPHOS 86  BILITOT 0.35  PROT 6.5  ALBUMIN 3.1*   No results for input(s): LIPASE, AMYLASE in the last 168 hours. No results for input(s): AMMONIA in the last 168 hours.  CBC: Recent Labs  Lab 09/28/17 1356 10/02/17 0634 10/02/17 0640  WBC 11.7* 11.6*  --   NEUTROABS 10.1* 7.1  --   HGB 13.4 14.8 15.6*  HCT 40.3 42.1 46.0  MCV 87.9 87.3  --   PLT 343 301  --     Cardiac Enzymes: No results for input(s): CKTOTAL, CKMB, CKMBINDEX, TROPONINI in the last 168 hours.  Lipid Panel: No results for input(s): CHOL, TRIG, HDL, CHOLHDL, VLDL, LDLCALC in the last 168 hours.  CBG: No results for input(s): GLUCAP in the last 168 hours.  Microbiology: Results for orders placed or performed during the hospital encounter of 09/06/17  Surgical pcr screen     Status: None   Collection Time: 09/06/17  2:27 PM  Result Value Ref Range Status   MRSA, PCR NEGATIVE NEGATIVE Final   Staphylococcus aureus NEGATIVE NEGATIVE Final    Comment: (NOTE) The Xpert SA Assay (FDA approved for NASAL specimens in patients 64 years of age and older), is one component of a comprehensive surveillance program. It is not intended to diagnose infection nor to guide or monitor treatment.     Coagulation Studies: No results for input(s): LABPROT, INR in the last 72 hours.  Imaging: No  results found.   Assessment: 71 year old female with new onset RUE and RLE twitching, most likely seizure activity in the context of hemorrhagic left frontal lobe  metastatic lesion 1. Hemorrhagic component of the metastatic lesion is new relative to recent brain imaging studies. The met is steadily increasing in size with increasing edema based upon serial imaging study results.  2. Mild hyponatremia.   Recommendations: 1. Loaded with Keppra 1000 mg IV after 1 mg IV Ativan 2. Increase scheduled Keppra dosage to 750 mg BID. Daya need to further titrate upwards. 3. EEG.  4. Decadron 5. Neurosurgery and radiation oncology consults 6. Seizure precautions 7. Correct serum Na level 8. Obtain serum magnesium level  Electronically signed: Dr. Kerney Elbe 10/02/2017, 6:53 AM

## 2017-10-03 ENCOUNTER — Inpatient Hospital Stay (HOSPITAL_COMMUNITY): Payer: Medicare HMO

## 2017-10-03 LAB — CBC
HCT: 37.3 % (ref 36.0–46.0)
Hemoglobin: 12.4 g/dL (ref 12.0–15.0)
MCH: 29.5 pg (ref 26.0–34.0)
MCHC: 33.2 g/dL (ref 30.0–36.0)
MCV: 88.8 fL (ref 78.0–100.0)
PLATELETS: 292 10*3/uL (ref 150–400)
RBC: 4.2 MIL/uL (ref 3.87–5.11)
RDW: 13.5 % (ref 11.5–15.5)
WBC: 7.9 10*3/uL (ref 4.0–10.5)

## 2017-10-03 LAB — BASIC METABOLIC PANEL
Anion gap: 8 (ref 5–15)
BUN: 26 mg/dL — ABNORMAL HIGH (ref 6–20)
CALCIUM: 9 mg/dL (ref 8.9–10.3)
CHLORIDE: 100 mmol/L — AB (ref 101–111)
CO2: 25 mmol/L (ref 22–32)
CREATININE: 1.03 mg/dL — AB (ref 0.44–1.00)
GFR, EST NON AFRICAN AMERICAN: 54 mL/min — AB (ref >60)
Glucose, Bld: 222 mg/dL — ABNORMAL HIGH (ref 65–99)
Potassium: 4.3 mmol/L (ref 3.5–5.1)
SODIUM: 133 mmol/L — AB (ref 135–145)

## 2017-10-03 LAB — GLUCOSE, CAPILLARY
GLUCOSE-CAPILLARY: 75 mg/dL (ref 65–99)
Glucose-Capillary: 199 mg/dL — ABNORMAL HIGH (ref 65–99)
Glucose-Capillary: 262 mg/dL — ABNORMAL HIGH (ref 65–99)
Glucose-Capillary: 207 mg/dL — ABNORMAL HIGH (ref 65–99)

## 2017-10-03 LAB — MAGNESIUM: MAGNESIUM: 1.7 mg/dL (ref 1.7–2.4)

## 2017-10-03 MED ORDER — INSULIN GLARGINE 100 UNIT/ML ~~LOC~~ SOLN
20.0000 [IU] | Freq: Every day | SUBCUTANEOUS | Status: DC
  Administered 2017-10-03: 20 [IU] via SUBCUTANEOUS
  Filled 2017-10-03: qty 0.2

## 2017-10-03 NOTE — Progress Notes (Signed)
Ref: Dixie Dials, MD   Subjective:  Feeling little better. Speech is thick. Vital signs stable. EEG pending.  Objective:  Vital Signs in the last 24 hours: Temp:  [98.1 F (36.7 C)-99 F (37.2 C)] 98.1 F (36.7 C) (11/11 1025) Pulse Rate:  [73-99] 88 (11/11 1025) Cardiac Rhythm: Normal sinus rhythm (11/11 0700) Resp:  [16-20] 18 (11/11 0439) BP: (126-132)/(62-71) 132/63 (11/11 1025) SpO2:  [97 %-99 %] 99 % (11/11 1025)  Physical Exam: BP Readings from Last 1 Encounters:  10/03/17 132/63     Wt Readings from Last 1 Encounters:  10/02/17 105 kg (231 lb 8 oz)    Weight change: 2.948 kg (6 lb 8 oz) Body mass index is 36.26 kg/m. HEENT: [place]/AT, Eyes-Brown, PERL, EOMI, Conjunctiva-Pink, Sclera-Non-icteric Neck: No JVD, No bruit, Trachea midline. Lungs:  Clearing, Bilateral. Cardiac:  Regular rhythm, normal S1 and S2, no S3. II/VI systolic murmur. Abdomen:  Soft, non-tender. BS present. Extremities:  No edema present. No cyanosis. No clubbing. CNS: AxOx3, Cranial nerves grossly intact, moves all 4 extremities. Right-sided weakness. Skin: Warm and dry.   Intake/Output from previous day: 11/10 0701 - 11/11 0700 In: 120 [P.O.:120] Out: 200 [Urine:200]    Lab Results: BMET    Component Value Date/Time   NA 133 (L) 10/03/2017 0326   NA 130 (L) 10/02/2017 0640   NA 130 (L) 10/02/2017 0634   NA 131 (L) 09/28/2017 1355   K 4.3 10/03/2017 0326   K 4.1 10/02/2017 0640   K 4.2 10/02/2017 0634   K 4.5 09/28/2017 1355   CL 100 (L) 10/03/2017 0326   CL 92 (L) 10/02/2017 0640   CL 93 (L) 10/02/2017 0634   CO2 25 10/03/2017 0326   CO2 24 10/02/2017 0634   CO2 23 09/28/2017 1355   CO2 25 09/09/2017 0915   GLUCOSE 222 (H) 10/03/2017 0326   GLUCOSE 243 (H) 10/02/2017 0640   GLUCOSE 235 (H) 10/02/2017 0634   GLUCOSE 248 (H) 09/28/2017 1355   BUN 26 (H) 10/03/2017 0326   BUN 27 (H) 10/02/2017 0640   BUN 25 (H) 10/02/2017 0634   BUN 25.7 09/28/2017 1355   CREATININE 1.03 (H)  10/03/2017 0326   CREATININE 1.10 (H) 10/02/2017 0640   CREATININE 1.28 (H) 10/02/2017 0634   CREATININE 1.2 (H) 09/28/2017 1355   CALCIUM 9.0 10/03/2017 0326   CALCIUM 9.5 10/02/2017 0634   CALCIUM 9.1 09/28/2017 1355   CALCIUM 8.7 (L) 09/09/2017 0915   GFRNONAA 54 (L) 10/03/2017 0326   GFRNONAA 41 (L) 10/02/2017 0634   GFRNONAA 52 (L) 09/09/2017 0915   GFRAA >60 10/03/2017 0326   GFRAA 48 (L) 10/02/2017 0634   GFRAA >60 09/09/2017 0915   CBC    Component Value Date/Time   WBC 7.9 10/03/2017 0326   RBC 4.20 10/03/2017 0326   HGB 12.4 10/03/2017 0326   HGB 13.4 09/28/2017 1356   HCT 37.3 10/03/2017 0326   HCT 40.3 09/28/2017 1356   PLT 292 10/03/2017 0326   PLT 343 09/28/2017 1356   MCV 88.8 10/03/2017 0326   MCV 87.9 09/28/2017 1356   MCH 29.5 10/03/2017 0326   MCHC 33.2 10/03/2017 0326   RDW 13.5 10/03/2017 0326   RDW 13.7 09/28/2017 1356   LYMPHSABS 3.5 10/02/2017 0634   LYMPHSABS 0.9 09/28/2017 1356   MONOABS 0.8 10/02/2017 0634   MONOABS 0.5 09/28/2017 1356   EOSABS 0.2 10/02/2017 0634   EOSABS 0.0 09/28/2017 1356   BASOSABS 0.0 10/02/2017 0634   BASOSABS  0.1 09/28/2017 1356   HEPATIC Function Panel Recent Labs    09/09/17 0914 09/28/17 1355 10/02/17 0634  PROT 6.9 6.5 6.8   HEMOGLOBIN A1C No components found for: HGA1C,  MPG CARDIAC ENZYMES Lab Results  Component Value Date   CKTOTAL 95 01/01/2009   CKMB 0.8 01/01/2009   TROPONINI <0.01        NO INDICATION OF MYOCARDIAL INJURY. 01/01/2009   TROPONINI <0.01        NO INDICATION OF MYOCARDIAL INJURY. 01/01/2009   TROPONINI <0.01        NO INDICATION OF MYOCARDIAL INJURY. 12/31/2008   BNP No results for input(s): PROBNP in the last 8760 hours. TSH No results for input(s): TSH in the last 8760 hours. CHOLESTEROL No results for input(s): CHOL in the last 8760 hours.  Scheduled Meds: . amLODipine  5 mg Oral BID  . hydrALAZINE  50 mg Oral BID  . insulin aspart  0-20 Units Subcutaneous TID WC   . insulin aspart  4 Units Subcutaneous TID WC  . insulin glargine  20 Units Subcutaneous QHS  . metFORMIN  500 mg Oral Q breakfast   And  . linagliptin  5 mg Oral Daily  . metoprolol succinate  25 mg Oral Daily  . pantoprazole  40 mg Oral Daily  . potassium chloride  10 mEq Oral BID  . rosuvastatin  10 mg Oral Daily  . umeclidinium bromide  1 puff Inhalation Daily   Continuous Infusions: . sodium chloride 40 mL/hr at 10/02/17 1148  . 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 Awaiting EEG. Appreciate neurology consult.     LOS: 1 day    Dixie Dials  MD  10/03/2017, 11:56 AM

## 2017-10-03 NOTE — Procedures (Signed)
EEG Report  Clinical History:  Right arm shaking in the context of metastatic lung cancer to the left frontal lobe.  Technical Summary:  A 19 channel digital EEG recording was performed using the 10-20 international system of electrode placement.  Bipolar and Referential montages were used.  The total recording time was approx 20 minutes.  Findings:  There is a well developed and regulated  posterior dominant rhythm of 9 Hz reactive to eye opening and closure.  No focal slowing is present.  There is an epileptiform discharge with maximal negativity at C4.  No electrographic seizure is present.  Sleep was not recorded.  Impression:  This is an abnormal EEG.  There is evidence of a seizure tendency emanating from the right vertex of the brain putting the patient at risk of focal seizures with or without secondary generalization.   Interesting, this epileptiform discharge is emanating from the other side of the brain from where the recent metastatic lesion has been found.  One can postulate another metastasis at this location which is not visualized on MRI yet.  Clinical correlation is recommended.    Rogue Jury, MS, MD

## 2017-10-03 NOTE — Progress Notes (Signed)
EEG Completed; Results Pending  

## 2017-10-03 NOTE — Progress Notes (Signed)
NEURO HOSPITALISTS PROGRESS NOTE  Subjective: Patient found laying in bed in NAD. Husband at bedside. States she is tired but starting to feel a little stronger. Right side remains weaker than Left. Voices no new complaints. No new acute events reported overnight. No seizure activity reported.  Exam: Vitals:   10/03/17 0439 10/03/17 1025  BP: 129/67 132/63  Pulse: 73 88  Resp: 18   Temp: 99 F (37.2 C) 98.1 F (36.7 C)  SpO2: 99% 99%   HEENT-  Normocephalic, no lesions, without obvious abnormality.  Normal external eye and conjunctiva.  Normal TM's bilaterally.  Normal auditory canals and external ears. Normal external nose, mucus membranes and septum.  Normal pharynx. Cardiovascular- regular rate and rhythm, S1, S2 normal, no murmur, click, rub or gallop, pulses palpable throughout   Lungs- chest clear, no wheezing, rales, normal symmetric air entry, Heart exam - S1, S2 normal, no murmur, no gallop, rate regular Abdomen- soft, non-tender; bowel sounds normal; no masses,  no organomegaly Extremities- no edema  Neurological Examination Mental Status: Alert and oriented. Anxious affect. Speech fluent with intact comprehension. Naming and repetition intact.  Cranial Nerves: II:  Visual fields intact bilaterally. PERRL.  III,IV, VI: EOMI without nystagmus. No ptosis.  V,VII: No facial droop. Facial temp sensation normal bilaterally VIII: hearing intact to conversation IX,X: Palate rises symmetrically XI: Head at midline XII: midline tongue extension Motor: RUE: 4/5 deltoid, biceps and grip strength. Arm drifts to the right when held antigravity. Intermittent coarse tremor and cogwheel rigidity.  RLE: 4/5 proximal with 5/5 distal strength. Mildly increased tone. Occasional coarse tremor.  LUE and LLE: 5/5 Sensory: Temp and light touch intact x 4. No extinction with DSS.  Cerebellar: No ataxia with FNF bilaterally. Slower movements on the right.  Gait:  Deferred  Medications:  Scheduled: . amLODipine  5 mg Oral BID  . hydrALAZINE  50 mg Oral BID  . insulin aspart  0-20 Units Subcutaneous TID WC  . insulin aspart  4 Units Subcutaneous TID WC  . insulin glargine  20 Units Subcutaneous QHS  . metFORMIN  500 mg Oral Q breakfast   And  . linagliptin  5 mg Oral Daily  . metoprolol succinate  25 mg Oral Daily  . pantoprazole  40 mg Oral Daily  . potassium chloride  10 mEq Oral BID  . rosuvastatin  10 mg Oral Daily  . umeclidinium bromide  1 puff Inhalation Daily   Continuous: . sodium chloride 40 mL/hr at 10/02/17 1148  . levETIRAcetam 750 mg (10/03/17 1029)   EXH:BZJIRCVEL, ALPRAZolam, gabapentin, HYDROcodone-acetaminophen, loratadine, meclizine, ondansetron  Pertinent Labs/Diagnostics: Recent Labs  Lab 10/02/17 1147 10/02/17 1632 10/02/17 2123 10/03/17 0619 10/03/17 1144  GLUCAP 225* 439* 304* 199* 262*   Recent Labs  Lab 09/28/17 1355 10/02/17 0634 10/02/17 0640 10/03/17 0326  NA 131* 130* 130* 133*  K 4.5 4.2 4.1 4.3  CL  --  93* 92* 100*  CO2 23 24  --  25  GLUCOSE 248* 235* 243* 222*  BUN 25.7 25* 27* 26*  CREATININE 1.2* 1.28* 1.10* 1.03*  CALCIUM 9.1 9.5  --  9.0  MG  --   --   --  1.7   Recent Labs  Lab 09/28/17 1355 10/02/17 0634  AST 11 21  ALT 12 13*  ALKPHOS 86 98  BILITOT 0.35 0.5  PROT 6.5 6.8  ALBUMIN 3.1* 3.5   Recent Labs  Lab 09/28/17 1356 10/02/17 0634 10/02/17 0640 10/03/17 0326  WBC 11.7*  11.6*  --  7.9  NEUTROABS 10.1* 7.1  --   --   HGB 13.4 14.8 15.6* 12.4  HCT 40.3 42.1 46.0 37.3  MCV 87.9 87.3  --  88.8  PLT 343 301  --  292   No results for input(s): CKTOTAL, CKMB, CKMBINDEX, TROPONINI in the last 168 hours. Recent Labs    10/02/17 0634  LABPROT 11.8  INR 0.87   Recent Labs    10/02/17 0740  COLORURINE YELLOW  LABSPEC 1.017  PHURINE 5.0  GLUCOSEU NEGATIVE  HGBUR SMALL*  BILIRUBINUR NEGATIVE  KETONESUR NEGATIVE  PROTEINUR 100*  NITRITE NEGATIVE   LEUKOCYTESUR NEGATIVE       Component Value Date/Time   CHOL 104 10/15/2015 0215   TRIG 71 10/15/2015 0215   HDL 48 10/15/2015 0215   CHOLHDL 2.2 10/15/2015 0215   VLDL 14 10/15/2015 0215   LDLCALC 42 10/15/2015 0215   Lab Results  Component Value Date   HGBA1C 8.1 (H) 10/02/2017      Component Value Date/Time   LABOPIA NONE DETECTED 10/02/2017 0740   COCAINSCRNUR NONE DETECTED 10/02/2017 0740   LABBENZ NONE DETECTED 10/02/2017 0740   AMPHETMU NONE DETECTED 10/02/2017 0740   THCU NONE DETECTED 10/02/2017 0740   LABBARB NONE DETECTED 10/02/2017 0740    Recent Labs  Lab 10/02/17 0634  ETH <10   IMAGING: I have personally reviewed the radiological images below and agree with the radiology interpretations.  Ct Head Code Stroke Wo Contrast  Result Date: 10/02/2017 CLINICAL DATA:  Code stroke. Focal neuro deficit less than 6 hours. Right-sided weakness with twitching. Lung mass with brain lesions consistent with metastatic disease. EXAM: CT HEAD WITHOUT CONTRAST TECHNIQUE: Contiguous axial images were obtained from the base of the skull through the vertex without intravenous contrast. COMPARISON:  MRI 09/23/2017.  CT 09/09/2017 FINDINGS: Brain: Vasogenic edema in the left frontal parietal lobe as noted on prior MRI. 11.5 mm hyperdense nodule within the edema compatible with hemorrhagic metastatic disease. This lesion shows enhancement on the prior study. No other areas of hemorrhage or edema. Ventricle size normal. No acute ischemic infarction. Vascular: Negative for hyperdense vessel. Skull: Negative Sinuses/Orbits: Negative Other: None ASPECTS ([place] Stroke Program Early CT Score) - Ganglionic level infarction (caudate, lentiform nuclei, internal capsule, insula, M1-M3 cortex): 7 - Supraganglionic infarction (M4-M6 cortex): 3 Total score (0-10 with 10 being normal): 10 IMPRESSION: 1. 11 mm hemorrhagic metastatic deposit in the left frontal parietal lobe with surrounding edema. The  hemorrhage is hyperdense and appears new compared with prior studies 2. ASPECTS is 10 These results were called by telephone at the time of interpretation on 10/02/2017 at 6:59 am to Dr. Cheral Marker, who verbally acknowledged these results. Electronically Signed   By: Franchot Gallo M.D.   On: 10/02/2017 06:59   MRI brain on 11/1 revealed slight enlargement of the metastasis affecting the medial left frontoparietal junction, now measuring 9 x 12 x 12 mm, with regional vasogenic edema.  EEG :This is an abnormal EEG.  There is evidence of a seizure tendency emanating from the right vertex of the brain putting the patient at risk of focal seizures with or without secondary generalization.   Interesting, this epileptiform discharge is emanating from the other side of the brain from where the recent metastatic lesion has been found.      Assessment: 72 year old female with new onset RUE and RLE twitching, most likely seizure activity in the context of hemorrhagic left frontal lobe metastatic lesion  1. Hemorrhagic component of the metastatic lesion is new relative to recent brain imaging studies. The met is steadily increasing in size with increasing edema based upon serial imaging study results.    10/03/17: Right sided weakness improving slowly. No further seizure activity reported. EEG report shows seizure tendency emanating from the right vertex of the brain     Recommendations: 1. Ativan IV PRN 2. Continue Keppra dosage to 750 mg BID.  3. Neurosurgery and Radiation oncology consults 4. Seizure precautions and No Driving x 6 months  5. PT/OT consultation  Hyponatremia Na level 130 on admission, 133 today - trending up  Hypomagnesia Magnesium level -1.7 Replacement per Primary team     Mary Sella. ANP-C [facility] 10/03/2017, 2:01 PM  NEUROHOSPITALIST ADDENDUM 71 year old female with metastatic left frontal lobe lesion resulting in right-sided seizures and weakness.  CT shows new hemorrhage in the area of metastatic lesion with increase in size of metastases. Seizures have been well controlled with Keppra 750 mg twice a day. While EEG is abnormal it is expected.   Patient can be discharged on Keppra. Seizure precautions and no driving for 6 months.   Karena Addison Aroor MD [facility] [phone]  If 7pm to 7am, please call on call as listed on AMION.

## 2017-10-04 ENCOUNTER — Inpatient Hospital Stay (HOSPITAL_COMMUNITY): Payer: Medicare HMO

## 2017-10-04 DIAGNOSIS — C7931 Secondary malignant neoplasm of brain: Secondary | ICD-10-CM

## 2017-10-04 LAB — GLUCOSE, CAPILLARY
GLUCOSE-CAPILLARY: 80 mg/dL (ref 65–99)
GLUCOSE-CAPILLARY: 166 mg/dL — AB (ref 65–99)
Glucose-Capillary: 91 mg/dL (ref 65–99)
Glucose-Capillary: 223 mg/dL — ABNORMAL HIGH (ref 65–99)

## 2017-10-04 LAB — BASIC METABOLIC PANEL
ANION GAP: 8 (ref 5–15)
BUN: 19 mg/dL (ref 6–20)
CHLORIDE: 96 mmol/L — AB (ref 101–111)
CO2: 27 mmol/L (ref 22–32)
Calcium: 8.8 mg/dL — ABNORMAL LOW (ref 8.9–10.3)
Creatinine, Ser: 1.2 mg/dL — ABNORMAL HIGH (ref 0.44–1.00)
GFR calc non Af Amer: 45 mL/min — ABNORMAL LOW (ref >60)
GFR, EST AFRICAN AMERICAN: 52 mL/min — AB (ref >60)
Glucose, Bld: 209 mg/dL — ABNORMAL HIGH (ref 65–99)
Potassium: 3.9 mmol/L (ref 3.5–5.1)
Sodium: 131 mmol/L — ABNORMAL LOW (ref 135–145)

## 2017-10-04 MED ORDER — POLYETHYLENE GLYCOL 3350 17 G PO PACK
17.0000 g | PACK | Freq: Every day | ORAL | Status: DC | PRN
  Administered 2017-10-04 – 2017-10-09 (×4): 17 g via ORAL
  Filled 2017-10-04 (×4): qty 1

## 2017-10-04 MED ORDER — BISACODYL 10 MG RE SUPP
10.0000 mg | Freq: Once | RECTAL | Status: AC
  Administered 2017-10-04: 10 mg via RECTAL
  Filled 2017-10-04: qty 1

## 2017-10-04 MED ORDER — INSULIN GLARGINE 100 UNIT/ML ~~LOC~~ SOLN
25.0000 [IU] | Freq: Every day | SUBCUTANEOUS | Status: DC
  Administered 2017-10-04 – 2017-10-09 (×5): 25 [IU] via SUBCUTANEOUS
  Filled 2017-10-04 (×6): qty 0.25

## 2017-10-04 NOTE — Evaluation (Signed)
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 CT head shows new 11 mm hemorrhagic metastatic deposit in left frontal lobe with surrounding edema.   Clinical Impression   PTA, pt was living with her husband and was independent. Pt currently requiring Min A for LB ADLs and functional mobility. Pt presenting with decreased strength and coordination in RUE, problem solving, and attention to R side. Pt would benefit from further acute OT to facilitate safe dc. Recommend dc home with 24 hour supervision/assistance and HHOT to optimize safety and independence with ADLs and functional mobility.    Follow Up Recommendations  Home health OT;Supervision/Assistance - 24 hour    Equipment Recommendations  3 in 1 bedside commode    Recommendations for Other Services PT consult     Precautions / Restrictions Precautions Precautions: Fall Restrictions Weight Bearing Restrictions: No      Mobility Bed Mobility Overal bed mobility: Needs Assistance Bed Mobility: Supine to Sit;Sit to Supine     Supine to sit: Supervision Sit to supine: Supervision   General bed mobility comments: supervision for safety and use of bed rails to pull up  Transfers Overall transfer level: Needs assistance Equipment used: None Transfers: Sit to/from Stand Sit to Stand: Min assist         General transfer comment: Min A to steady in standing    Balance Overall balance assessment: Needs assistance Sitting-balance support: No upper extremity supported;Feet supported Sitting balance-Leahy Scale: Good Sitting balance - Comments: Able to lean forward to adjust socks without difficulty   Standing balance support: No upper extremity  supported;During functional activity Standing balance-Leahy Scale: Fair Standing balance comment: Able to maintain static standing without UE support                           ADL either performed or assessed with clinical judgement   ADL Overall ADL's : Needs assistance/impaired Eating/Feeding: Set up;Sitting   Grooming: Wash/dry face;Oral care;Wash/dry hands;Min guard;Standing   Upper Body Bathing: Min guard;Sitting   Lower Body Bathing: Minimal assistance;Sit to/from stand   Upper Body Dressing : Min guard;Standing   Lower Body Dressing: Minimal assistance;Sit to/from stand Lower Body Dressing Details (indicate cue type and reason): Able to don socks and underwear with Min A for standing balance during dynamic movements Toilet Transfer: Minimal assistance;Ambulation;Regular Toilet;Grab bars Toilet Transfer Details (indicate cue type and reason): Min A for safe descent. Pt with posterior lean when sitting Toileting- Clothing Manipulation and Hygiene: Set up;Sitting/lateral lean       Functional mobility during ADLs: Minimal assistance General ADL Comments: Pt requiring Min A for LB ADLs and functional mobility due to decreased balance in standing. Pt demonstrating decreased funcitonal performance compared to PLOF.     Vision Baseline Vision/History: Wears glasses Wears Glasses: At all times Patient Visual Report: No change from baseline;Other (comment)(Pt with cataract surgery PTA and states she has blurry vision on L side/eye) Vision Assessment?: Yes;Vision impaired- to be further tested in functional context Eye Alignment: Within Functional Limits Tracking/Visual Pursuits: Decreased smoothness of horizontal tracking;Decreased smoothness of vertical tracking;Decreased smoothness of eye movement to RIGHT superior field;Decreased smoothness of eye movement to RIGHT inferior field Additional Comments: Pt with decreased smooth track.  Able to track to Left and difficulty  maintaining eye gaze to R. Noted inattention to R during ADLs     Perception     Praxis      Pertinent Vitals/Pain Pain Assessment: Faces Faces Pain Scale: No hurt Pain Intervention(s): Monitored during session     Hand Dominance Right   Extremity/Trunk Assessment Upper Extremity Assessment Upper Extremity Assessment: RUE deficits/detail RUE Deficits / Details: RUE with decreased strength and coordination as seen during ADLs and testing. Pt requiring increased time and effort to perform coordination testing. pt able to perform finger opposition, bring hand ot mouth, and bring hand to top of head with increased time. RUE Coordination: decreased fine motor;decreased gross motor   Lower Extremity Assessment Lower Extremity Assessment: Defer to PT evaluation   Cervical / Trunk Assessment Cervical / Trunk Assessment: Normal   Communication Communication Communication: No difficulties   Cognition Arousal/Alertness: Awake/alert(Drowsy) Behavior During Therapy: WFL for tasks assessed/performed;Flat affect Overall Cognitive Status: Within Functional Limits for tasks assessed Area of Impairment: Problem solving                             Problem Solving: Slow processing;Requires verbal cues General Comments: Slower processing and required increased time. Feel pt is drowsy and sleepy. Able to follow simple commands.   General Comments  Husband present throughout session    Exercises     Shoulder Instructions      Home Living Family/patient expects to be discharged to:: Private residence Living Arrangements: Spouse/significant other Available Help at Discharge: Family;Friend(s);Available 24 hours/day(Husband works as a Administrator, but friends available) Type of Home: House Home Access: Stairs to enter Technical brewer of Steps: 2   Traill: One level     Bathroom Shower/Tub: Engineering geologist: None          Prior Functioning/Environment Level of Independence: Independent        Comments: ADLs, IADLs, and driving        OT Problem List: Decreased strength;Decreased range of motion;Decreased activity tolerance;Impaired balance (sitting and/or standing);Decreased safety awareness;Decreased knowledge of use of DME or AE;Decreased knowledge of precautions;Pain;Impaired UE functional use      OT Treatment/Interventions: Self-care/ADL training;Therapeutic exercise;Energy conservation;DME and/or AE instruction;Therapeutic activities;Patient/family education    OT Goals(Current goals can be found in the care plan section) Acute Rehab OT Goals Patient Stated Goal: Go home OT Goal Formulation: With patient/family Time For Goal Achievement: 10/18/17 Potential to Achieve Goals: Good  OT Frequency: Min 3X/week   Barriers to D/C:            Co-evaluation              AM-PAC PT "6 Clicks" Daily Activity     Outcome Measure Help from another person eating meals?: None Help from another person taking care of personal grooming?: A Little Help from another person toileting, which includes using toliet, bedpan, or urinal?: A Little Help from another person bathing (including washing, rinsing, drying)?: A Little Help from another person to put on and taking off regular upper body clothing?: A Little Help from another person to put on and taking off regular lower body clothing?: A Little 6 Click Score: 19   End of Session Equipment Utilized During Treatment: Gait belt Nurse Communication: Mobility status  Activity Tolerance: Patient tolerated treatment well Patient left: in bed;with bed alarm set;with call bell/phone within  reach  OT Visit Diagnosis: Unsteadiness on feet (R26.81);Other abnormalities of gait and mobility (R26.89);Muscle weakness (generalized) (M62.81);Other symptoms and signs involving cognitive function;Hemiplegia and hemiparesis Hemiplegia -  Right/Left: Right Hemiplegia - dominant/non-dominant: Dominant Hemiplegia - caused by: Cerebral infarction                Time: 3785-8850 OT Time Calculation (min): 26 min Charges:  OT General Charges $OT Visit: 1 Visit OT Evaluation $OT Eval Moderate Complexity: 1 Mod OT Treatments $Self Care/Home Management : 8-22 mins G-Codes:     [place], OTR/L Acute Rehab Pager: [phone] Office: [place] 10/04/2017, 9:15 AM

## 2017-10-04 NOTE — Progress Notes (Signed)
Pt is reporting that her R leg feels heavier than it was feeling. Pt seems to be dragging her R foot. Dr Doylene Canard was page notified and ordered for RN to call Neuro for recommendation. Neurologist called and order a repeat CT scan. Will continue to monitor.

## 2017-10-04 NOTE — Evaluation (Signed)
Physical Therapy Evaluation Patient Details Name: [name] MRN: [id number] DOB: December 25, 1945 Today's Date: 10/04/2017   History of Present Illness  71 year old female with recent diagnosis of RUL lung  Nodule with metastatic lesion in left cerebral hemisphere has new onset of RUE becoming heavy and weak. CT head shows new 11 mm hemorrhagic metastatic deposit in left frontal lobe with surrounding edema.  Clinical Impression  Pt with noted R sided inattention without self correction or ability to comprehend when she keeps running into objects on the right side.Pt is at an increased fall risk. Pt to require 24/7 assist for safe d/c home. Recommend HHPT to address home setup  And optimize it for safe function for pation    Follow Up Recommendations Home health PT;Supervision/Assistance - 24 hour    Equipment Recommendations  3in1 (PT)    Recommendations for Other Services       Precautions / Restrictions Precautions Precautions: Fall Restrictions Weight Bearing Restrictions: No      Mobility  Bed Mobility Overal bed mobility: Needs Assistance Bed Mobility: Supine to Sit     Supine to sit: Supervision Sit to supine: Supervision   General bed mobility comments: supervision for safety and use of bed rails to pull up  Transfers Overall transfer level: Needs assistance Equipment used: None Transfers: Sit to/from Stand Sit to Stand: Min assist         General transfer comment: Min A to steady in standing  Ambulation/Gait Ambulation/Gait assistance: Min assist Ambulation Distance (Feet): 120 Feet Assistive device: None Gait Pattern/deviations: Step-through pattern;Decreased stride length;Staggering right Gait velocity: slow and guarded Gait velocity interpretation: Below normal speed for age/gender General Gait Details: pt with constant vearing to the right and bumping into wall, beds, doors with no effort to self correct  Stairs            Wheelchair Mobility     Modified Rankin (Stroke Patients Only)       Balance Overall balance assessment: Needs assistance Sitting-balance support: No upper extremity supported;Feet supported Sitting balance-Leahy Scale: Good Sitting balance - Comments: Able to lean forward to adjust socks without difficulty   Standing balance support: No upper extremity supported;During functional activity Standing balance-Leahy Scale: Fair Standing balance comment: Able to maintain static standing without UE support                             Pertinent Vitals/Pain Pain Assessment: 0-10 Pain Score: 0-No pain Faces Pain Scale: No hurt Pain Intervention(s): Monitored during session    Home Living Family/patient expects to be discharged to:: Private residence Living Arrangements: Spouse/significant other Available Help at Discharge: Family;Friend(s);Available 24 hours/day(Husband works as a Administrator, but friends available) Type of Home: House Home Access: Stairs to enter   Technical brewer of Steps: 2 [place]: One [place]: Environmental consultant - 2 wheels      Prior Function Level of Independence: Independent         Comments: ADLs, IADLs, and driving     Hand Dominance   Dominant Hand: Right    Extremity/Trunk Assessment   Upper Extremity Assessment Upper Extremity Assessment: RUE deficits/detail RUE Deficits / Details: RUE with decreased strength and coordination as seen during ADLs and testing. Pt requiring increased time and effort to perform coordination testing. pt able to perform finger opposition, bring hand ot mouth, and bring hand to top of head with increased time. RUE Coordination: decreased fine motor;decreased  gross motor    Lower Extremity Assessment Lower Extremity Assessment: Generalized weakness    Cervical / Trunk Assessment Cervical / Trunk Assessment: Normal  Communication   Communication: No difficulties  Cognition Arousal/Alertness:  Awake/alert Behavior During Therapy: WFL for tasks assessed/performed;Flat affect Overall Cognitive Status: Impaired/Different from baseline Area of Impairment: Problem solving;Awareness                           Awareness: Intellectual(R sided intattention) Problem Solving: Slow processing;Requires verbal cues General Comments: pt con't to run into things on the R side during ambulation, pt did not self correct for upcoming obstacles. despite max v/c's      General Comments General comments (skin integrity, edema, etc.): pt with report of impaired L eyes vision deficits report it's cloudy and white. Pt unable to track fully to the R however and has noted innattension to the R side.    Exercises     Assessment/Plan    PT Assessment Patient needs continued PT services  PT Problem List Decreased strength;Decreased range of motion;Decreased activity tolerance;Decreased balance;Decreased mobility;Decreased coordination;Decreased cognition;Decreased safety awareness;Decreased knowledge of precautions       PT Treatment Interventions DME instruction;Gait training;Stair training;Functional mobility training;Therapeutic activities;Therapeutic exercise;Balance training;Neuromuscular re-education    PT Goals (Current goals can be found in the Care Plan section)  Acute Rehab PT Goals Patient Stated Goal: Go home PT Goal Formulation: With patient Time For Goal Achievement: 10/11/17 Potential to Achieve Goals: Good Additional Goals Additional Goal #1: Pt to score >19 on DGI to indicate minimal falls risk.    Frequency Min 3X/week   Barriers to discharge        Co-evaluation               AM-PAC PT "6 Clicks" Daily Activity  Outcome Measure Difficulty turning over in bed (including adjusting bedclothes, sheets and blankets)?: A Little Difficulty moving from lying on back to sitting on the side of the bed? : A Little Difficulty sitting down on and standing up from a  chair with arms (e.g., wheelchair, bedside commode, etc,.)?: A Little Help needed moving to and from a bed to chair (including a wheelchair)?: A Little Help needed walking in hospital room?: A Little Help needed climbing 3-5 steps with a railing? : A Lot 6 Click Score: 17    End of Session Equipment Utilized During Treatment: Gait belt Activity Tolerance: Patient tolerated treatment well Patient left: in chair;with call bell/phone within reach;with chair alarm set Nurse Communication: Mobility status PT Visit Diagnosis: Unsteadiness on feet (R26.81);Other symptoms and signs involving the nervous system (O70.786)    Time: 7544-9201 PT Time Calculation (min) (ACUTE ONLY): 22 min   Charges:   PT Evaluation $PT Eval Moderate Complexity: 1 Mod     PT G Codes:        Kittie Plater, PT, DPT Pager #: [phone] Office #: [phone]   [place] Vanaman M Brenton Joines 10/04/2017, 10:09 AM

## 2017-10-04 NOTE — Progress Notes (Signed)
Ref: Dixie Dials, MD   Subjective:  Right sided weakness persist. PT evaluation noted.   Objective:  Vital Signs in the last 24 hours: Temp:  [98.4 F (36.9 C)-98.7 F (37.1 C)] 98.7 F (37.1 C) (11/12 0942) Pulse Rate:  [65-80] 79 (11/12 0942) Cardiac Rhythm: Normal sinus rhythm (11/12 0800) Resp:  [18] 18 (11/12 0942) BP: (108-140)/(43-93) 121/62 (11/12 0942) SpO2:  [95 %-99 %] 97 % (11/12 0942)  Physical Exam: BP Readings from Last 1 Encounters:  10/04/17 121/62     Wt Readings from Last 1 Encounters:  10/02/17 105 kg (231 lb 8 oz)    Weight change:  Body mass index is 36.26 kg/m. HEENT: [place]/AT, Eyes-Brown, PERL, EOMI, Conjunctiva-Pink, Sclera-Non-icteric Neck: No JVD, No bruit, Trachea midline. Lungs:  Clear, Bilateral. Cardiac:  Regular rhythm, normal S1 and S2, no S3. II/VI systolic murmur. Abdomen:  Soft, non-tender. BS present. Extremities:  No edema present. No cyanosis. No clubbing. CNS: AxOx3, Cranial nerves grossly intact, moves all 4 extremities. Right sided weakness. Skin: Warm and dry.   Intake/Output from previous day: 11/11 0701 - 11/12 0700 In: 825 [P.O.:240; I.V.:480; IV Piggyback:105] Out: 550 [Urine:550]    Lab Results: BMET    Component Value Date/Time   NA 131 (L) 10/04/2017 1004   NA 133 (L) 10/03/2017 0326   NA 130 (L) 10/02/2017 0640   NA 131 (L) 09/28/2017 1355   K 3.9 10/04/2017 1004   K 4.3 10/03/2017 0326   K 4.1 10/02/2017 0640   K 4.5 09/28/2017 1355   CL 96 (L) 10/04/2017 1004   CL 100 (L) 10/03/2017 0326   CL 92 (L) 10/02/2017 0640   CO2 27 10/04/2017 1004   CO2 25 10/03/2017 0326   CO2 24 10/02/2017 0634   CO2 23 09/28/2017 1355   GLUCOSE 209 (H) 10/04/2017 1004   GLUCOSE 222 (H) 10/03/2017 0326   GLUCOSE 243 (H) 10/02/2017 0640   GLUCOSE 248 (H) 09/28/2017 1355   BUN 19 10/04/2017 1004   BUN 26 (H) 10/03/2017 0326   BUN 27 (H) 10/02/2017 0640   BUN 25.7 09/28/2017 1355   CREATININE 1.20 (H) 10/04/2017 1004   CREATININE 1.03 (H) 10/03/2017 0326   CREATININE 1.10 (H) 10/02/2017 0640   CREATININE 1.2 (H) 09/28/2017 1355   CALCIUM 8.8 (L) 10/04/2017 1004   CALCIUM 9.0 10/03/2017 0326   CALCIUM 9.5 10/02/2017 0634   CALCIUM 9.1 09/28/2017 1355   GFRNONAA 45 (L) 10/04/2017 1004   GFRNONAA 54 (L) 10/03/2017 0326   GFRNONAA 41 (L) 10/02/2017 0634   GFRAA 52 (L) 10/04/2017 1004   GFRAA >60 10/03/2017 0326   GFRAA 48 (L) 10/02/2017 0634   CBC    Component Value Date/Time   WBC 7.9 10/03/2017 0326   RBC 4.20 10/03/2017 0326   HGB 12.4 10/03/2017 0326   HGB 13.4 09/28/2017 1356   HCT 37.3 10/03/2017 0326   HCT 40.3 09/28/2017 1356   PLT 292 10/03/2017 0326   PLT 343 09/28/2017 1356   MCV 88.8 10/03/2017 0326   MCV 87.9 09/28/2017 1356   MCH 29.5 10/03/2017 0326   MCHC 33.2 10/03/2017 0326   RDW 13.5 10/03/2017 0326   RDW 13.7 09/28/2017 1356   LYMPHSABS 3.5 10/02/2017 0634   LYMPHSABS 0.9 09/28/2017 1356   MONOABS 0.8 10/02/2017 0634   MONOABS 0.5 09/28/2017 1356   EOSABS 0.2 10/02/2017 0634   EOSABS 0.0 09/28/2017 1356   BASOSABS 0.0 10/02/2017 0634   BASOSABS 0.1 09/28/2017 1356  HEPATIC Function Panel Recent Labs    09/09/17 0914 09/28/17 1355 10/02/17 0634  PROT 6.9 6.5 6.8   HEMOGLOBIN A1C No components found for: HGA1C,  MPG CARDIAC ENZYMES Lab Results  Component Value Date   CKTOTAL 95 01/01/2009   CKMB 0.8 01/01/2009   TROPONINI <0.01        NO INDICATION OF MYOCARDIAL INJURY. 01/01/2009   TROPONINI <0.01        NO INDICATION OF MYOCARDIAL INJURY. 01/01/2009   TROPONINI <0.01        NO INDICATION OF MYOCARDIAL INJURY. 12/31/2008   BNP No results for input(s): PROBNP in the last 8760 hours. TSH No results for input(s): TSH in the last 8760 hours. CHOLESTEROL No results for input(s): CHOL in the last 8760 hours.  Scheduled Meds: . amLODipine  5 mg Oral BID  . hydrALAZINE  50 mg Oral BID  . insulin aspart  0-20 Units Subcutaneous TID WC  . insulin  aspart  4 Units Subcutaneous TID WC  . insulin glargine  20 Units Subcutaneous QHS  . metFORMIN  500 mg Oral Q breakfast   And  . linagliptin  5 mg Oral Daily  . metoprolol succinate  25 mg Oral Daily  . pantoprazole  40 mg Oral Daily  . potassium chloride  10 mEq Oral BID  . rosuvastatin  10 mg Oral Daily  . umeclidinium bromide  1 puff Inhalation Daily   Continuous Infusions: . sodium chloride 40 mL/hr at 10/02/17 1148  . 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   Home with home PT as recommended.   LOS: 2 days    Dixie Dials  MD  10/04/2017, 11:02 AM

## 2017-10-04 NOTE — Progress Notes (Signed)
Inpatient Diabetes Program Recommendations  AACE/ADA: New Consensus Statement on Inpatient Glycemic Control (2015)  Target Ranges:  Prepandial:   less than 140 mg/dL      Peak postprandial:   less than 180 mg/dL (1-2 hours)      Critically ill patients:  140 - 180 mg/dL   Lab Results  Component Value Date   GLUCAP 91 10/04/2017   HGBA1C 8.1 (H) 10/02/2017    Review of Glycemic Control  Results for [name], [name] (MRN [id number]) as of 10/04/2017 11:02  Ref. Range 10/03/2017 06:19 10/03/2017 11:44 10/03/2017 16:40 10/03/2017 21:28 10/04/2017 06:26  Glucose-Capillary Latest Ref Range: 65 - 99 mg/dL 199 (H) 262 (H) 75 207 (H) 91    Diabetes history: Type 2 Outpatient Diabetes medications: none noted  Current orders for Inpatient glycemic control:  Lantus 20 units qhs, Metformin 500mg  qam, Tradjenta 5mg  qam, Novolog 4 units tid, Novolog 0-20 units tid  Inpatient Diabetes Program Recommendations: RN staff- please ensure Novolog correction insulin (0-20 units) is given within 1 hour of the blood sugar being taken- if it has been more than an hour, please recheck the blood sugar so the dose is accurate.   Hold Novolog 4 units tid if patient is not eating at least 50%.  Gentry Fitz, RN, BA, MHA, CDE Diabetes Coordinator Inpatient Diabetes Program  [phone] (Team Pager) [phone] ([place]) 10/04/2017 11:09 AM

## 2017-10-04 NOTE — Progress Notes (Signed)
Neurology Progress Note   S:// Neurology recalled for worsening right leg weakness.  Patient reported her right leg feels heavier than it was feeling.  Nursing also noted the patient was dragging her right foot. Also reports of ongoing blurry vision for many days.  Complains of sinus headache.  O:// Current vital signs: BP (!) 124/58 (BP Location: Right Arm)   Pulse 79   Temp 98.3 F (36.8 C) (Oral)   Resp 20   Ht '5\' 7"'$  (1.702 m)   Wt 105 kg (231 lb 8 oz)   SpO2 95%   BMI 36.26 kg/m  Vital signs in last 24 hours: Temp:  [98.3 F (36.8 C)-100.4 F (38 C)] 98.3 F (36.8 C) (11/12 2032) Pulse Rate:  [65-79] 79 (11/12 2032) Resp:  [18-20] 20 (11/12 2032) BP: (108-127)/(43-93) 124/58 (11/12 2032) SpO2:  [94 %-97 %] 95 % (11/12 2032) GENERAL: Awake, alert in NAD HEENT: - Normocephalic and atraumatic, dry mm, no LN++, no Thyromegally LUNGS - Clear to auscultation bilaterally with no wheezes CV - S1S2 RRR, no m/r/g, equal pulses bilaterally. ABDOMEN - Soft, nontender, nondistended with normoactive BS Ext: warm, well perfused, intact peripheral pulses, no edema NEURO:  Mental Status: AA&Ox3  Language: speech is here naming, repetition, fluency, and comprehension intact Cranial Nerves: PERRL EOMI, visual fields full, no facial asymmetry facial sensation intact, hearing intact, tongue/uvula/soft palate midline, normal sternocleidomastoid and trapezius muscle strength. No evidence of tongue atrophy or fibrillations Motor: Right upper extremity-4+/5, right lower extremity proximally 4+/5, distal 5/5.  Left upper extremity and lower extremity 5/5. Tone: is normal and bulk is normal Sensation- Intact to light touch bilaterally Coordination: FTN intact bilaterally Gait- deferred  Medications  Current Facility-Administered Medications:  .  0.9 %  sodium chloride infusion, , Intravenous, Continuous, Dixie Dials, MD, Last Rate: 10 mL/hr at 10/04/17 2001 .  albuterol (PROVENTIL) (2.5  MG/3ML) 0.083% nebulizer solution 2.5 mg, 2.5 mg, Nebulization, Q6H PRN, Dixie Dials, MD .  ALPRAZolam Duanne Moron) tablet 0.25 mg, 0.25 mg, Oral, Daily PRN, Dixie Dials, MD, 0.25 mg at 10/03/17 1638 .  amLODipine (NORVASC) tablet 5 mg, 5 mg, Oral, BID, Dixie Dials, MD, 5 mg at 10/04/17 1013 .  gabapentin (NEURONTIN) capsule 100 mg, 100 mg, Oral, Daily PRN, Dixie Dials, MD .  hydrALAZINE (APRESOLINE) tablet 50 mg, 50 mg, Oral, BID, Dixie Dials, MD, 50 mg at 10/04/17 1013 .  HYDROcodone-acetaminophen (NORCO/VICODIN) 5-325 MG per tablet 1 tablet, 1 tablet, Oral, Q4H PRN, Dixie Dials, MD, 1 tablet at 10/04/17 1720 .  insulin aspart (novoLOG) injection 0-20 Units, 0-20 Units, Subcutaneous, TID WC, Dixie Dials, MD, 7 Units at 10/04/17 1246 .  insulin aspart (novoLOG) injection 4 Units, 4 Units, Subcutaneous, TID WC, Dixie Dials, MD, 4 Units at 10/04/17 1247 .  insulin glargine (LANTUS) injection 25 Units, 25 Units, Subcutaneous, QHS, Dixie Dials, MD .  levETIRAcetam (KEPPRA) 750 mg in sodium chloride 0.9 % 100 mL IVPB, 750 mg, Intravenous, BID, Kerney Elbe, MD, Stopped at 10/04/17 1410 .  metFORMIN (GLUCOPHAGE) tablet 500 mg, 500 mg, Oral, Q breakfast, 500 mg at 10/04/17 0813 **AND** linagliptin (TRADJENTA) tablet 5 mg, 5 mg, Oral, Daily, Dixie Dials, MD, 5 mg at 10/04/17 0263 .  loratadine (CLARITIN) tablet 10 mg, 10 mg, Oral, Daily PRN, Dixie Dials, MD, 10 mg at 10/03/17 1638 .  meclizine (ANTIVERT) tablet 25 mg, 25 mg, Oral, Daily PRN, Dixie Dials, MD .  metoprolol succinate (TOPROL-XL) 24 hr tablet 25 mg, 25 mg, Oral, Daily, Kadakia, Ajay,  MD, 25 mg at 10/04/17 1013 .  ondansetron (ZOFRAN-ODT) disintegrating tablet 4 mg, 4 mg, Oral, Q8H PRN, Dixie Dials, MD .  pantoprazole (PROTONIX) EC tablet 40 mg, 40 mg, Oral, Daily, Dixie Dials, MD, 40 mg at 10/04/17 1013 .  polyethylene glycol (MIRALAX / GLYCOLAX) packet 17 g, 17 g, Oral, Daily PRN, Dixie Dials, MD, 17 g at 10/04/17  1413 .  potassium chloride (K-DUR,KLOR-CON) CR tablet 10 mEq, 10 mEq, Oral, BID, Dixie Dials, MD, 10 mEq at 10/04/17 1013 .  rosuvastatin (CRESTOR) tablet 10 mg, 10 mg, Oral, Daily, Doylene Canard, Ajay, MD, 10 mg at 10/04/17 1723 .  umeclidinium bromide (INCRUSE ELLIPTA) 62.5 MCG/INH 1 puff, 1 puff, Inhalation, Daily, Dixie Dials, MD, 1 puff at 10/04/17 0908  Facility-Administered Medications Ordered in Other Encounters:  .  fentaNYL (SUBLIMAZE) injection, , , Anesthesia Intra-op, Harder, Rebeca Alert, CRNA, 50 mcg at 09/09/17 0718 .  lactated ringers infusion, , , Continuous PRN, Harder, Rebeca Alert, CRNA .  lactated ringers infusion, , , Continuous PRN, Harder, Rebeca Alert, CRNA .  midazolam (VERSED) 5 MG/5ML injection, , , Anesthesia Intra-op, Candis Shine, CRNA, 1 mg at 09/09/17 0718 Labs CBC    Component Value Date/Time   WBC 7.9 10/03/2017 0326   RBC 4.20 10/03/2017 0326   HGB 12.4 10/03/2017 0326   HGB 13.4 09/28/2017 1356   HCT 37.3 10/03/2017 0326   HCT 40.3 09/28/2017 1356   PLT 292 10/03/2017 0326   PLT 343 09/28/2017 1356   MCV 88.8 10/03/2017 0326   MCV 87.9 09/28/2017 1356   MCH 29.5 10/03/2017 0326   MCHC 33.2 10/03/2017 0326   RDW 13.5 10/03/2017 0326   RDW 13.7 09/28/2017 1356   LYMPHSABS 3.5 10/02/2017 0634   LYMPHSABS 0.9 09/28/2017 1356   MONOABS 0.8 10/02/2017 0634   MONOABS 0.5 09/28/2017 1356   EOSABS 0.2 10/02/2017 0634   EOSABS 0.0 09/28/2017 1356   BASOSABS 0.0 10/02/2017 0634   BASOSABS 0.1 09/28/2017 1356    CMP     Component Value Date/Time   NA 131 (L) 10/04/2017 1004   NA 131 (L) 09/28/2017 1355   K 3.9 10/04/2017 1004   K 4.5 09/28/2017 1355   CL 96 (L) 10/04/2017 1004   CO2 27 10/04/2017 1004   CO2 23 09/28/2017 1355   GLUCOSE 209 (H) 10/04/2017 1004   GLUCOSE 248 (H) 09/28/2017 1355   BUN 19 10/04/2017 1004   BUN 25.7 09/28/2017 1355   CREATININE 1.20 (H) 10/04/2017 1004   CREATININE 1.2 (H) 09/28/2017 1355   CALCIUM 8.8 (L) 10/04/2017  1004   CALCIUM 9.1 09/28/2017 1355   PROT 6.8 10/02/2017 0634   PROT 6.5 09/28/2017 1355   ALBUMIN 3.5 10/02/2017 0634   ALBUMIN 3.1 (L) 09/28/2017 1355   AST 21 10/02/2017 0634   AST 11 09/28/2017 1355   ALT 13 (L) 10/02/2017 0634   ALT 12 09/28/2017 1355   ALKPHOS 98 10/02/2017 0634   ALKPHOS 86 09/28/2017 1355   BILITOT 0.5 10/02/2017 0634   BILITOT 0.35 09/28/2017 1355   GFRNONAA 45 (L) 10/04/2017 1004   GFRAA 52 (L) 10/04/2017 1004    Lipid Panel     Component Value Date/Time   CHOL 104 10/15/2015 0215   TRIG 71 10/15/2015 0215   HDL 48 10/15/2015 0215   CHOLHDL 2.2 10/15/2015 0215   VLDL 14 10/15/2015 0215   LDLCALC 42 10/15/2015 0215     Imaging I have reviewed images in epic and the results  pertinent to this consultation are: CT-scan of the brain -1.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  On my initial evaluation, preliminarily looks unchanged from the scan on 10/02/2017.  MRI examination of the brain that was done on 09/23/2017 showed a slightly enlarged metastasis affecting the medial left frontoparietal junction with regional vasogenic edema.  Assessment:  71 year old woman with metastatic left frontal lobe lesion with right-sided weakness and seizures with worsening right-sided weakness today. On my examination in comparison with the examination documented on 10/03/2017 by Dr. Lorraine Lax, I do not feel there is an objective change in the exam.  She might have subjective worsening of her symptoms.   Impression: Metastatic left frontoparietal lesion Seizures Weakness  Recommendations: Continue with Keppra at the current dose Seizure precautions I would recommend checking with oncology for further management of the brain met and the primary solitary lung mass.  Last note on September 28, 2017 by the oncologist mentions stereotactic radiotherapy to the solitary brain met followed by surgical resection of the  solitary lung tumor or stereotactic radiotherapy of the lung mass.  Will defer treatment and management per oncology. If her symptoms worsen or she subjectively continues to feel weak, consider adding dexamethasone to her current regimen for the vasogenic edema surrounding the metastatic lesion. Again, I would defer this to oncology.  Please call neurology with questions as needed.  Amie Portland, MD [facility] [phone] If 7pm to 7am, please call on call as listed on AMION.

## 2017-10-05 ENCOUNTER — Ambulatory Visit: Payer: Medicare HMO | Admitting: Thoracic Surgery (Cardiothoracic Vascular Surgery)

## 2017-10-05 LAB — COMPREHENSIVE METABOLIC PANEL
ALT: 11 U/L — ABNORMAL LOW (ref 14–54)
ANION GAP: 8 (ref 5–15)
AST: 15 U/L (ref 15–41)
Albumin: 2.7 g/dL — ABNORMAL LOW (ref 3.5–5.0)
Alkaline Phosphatase: 86 U/L (ref 38–126)
BUN: 20 mg/dL (ref 6–20)
CHLORIDE: 98 mmol/L — AB (ref 101–111)
CO2: 26 mmol/L (ref 22–32)
Calcium: 8.5 mg/dL — ABNORMAL LOW (ref 8.9–10.3)
Creatinine, Ser: 1.26 mg/dL — ABNORMAL HIGH (ref 0.44–1.00)
GFR calc Af Amer: 49 mL/min — ABNORMAL LOW (ref >60)
GFR, EST NON AFRICAN AMERICAN: 42 mL/min — AB (ref >60)
Glucose, Bld: 116 mg/dL — ABNORMAL HIGH (ref 65–99)
POTASSIUM: 4.1 mmol/L (ref 3.5–5.1)
Sodium: 132 mmol/L — ABNORMAL LOW (ref 135–145)
TOTAL PROTEIN: 5.5 g/dL — AB (ref 6.5–8.1)
Total Bilirubin: 0.4 mg/dL (ref 0.3–1.2)

## 2017-10-05 LAB — GLUCOSE, CAPILLARY
GLUCOSE-CAPILLARY: 108 mg/dL — AB (ref 65–99)
GLUCOSE-CAPILLARY: 119 mg/dL — AB (ref 65–99)
GLUCOSE-CAPILLARY: 191 mg/dL — AB (ref 65–99)
GLUCOSE-CAPILLARY: 123 mg/dL — AB (ref 65–99)
GLUCOSE-CAPILLARY: 162 mg/dL — AB (ref 65–99)

## 2017-10-05 MED ORDER — HYDRALAZINE HCL 25 MG PO TABS
25.0000 mg | ORAL_TABLET | Freq: Two times a day (BID) | ORAL | Status: DC
  Administered 2017-10-05 – 2017-10-10 (×10): 25 mg via ORAL
  Filled 2017-10-05 (×11): qty 1

## 2017-10-05 MED ORDER — LOSARTAN POTASSIUM 50 MG PO TABS
25.0000 mg | ORAL_TABLET | Freq: Every day | ORAL | Status: DC
  Administered 2017-10-05 – 2017-10-11 (×7): 25 mg via ORAL
  Filled 2017-10-05 (×7): qty 1

## 2017-10-05 MED ORDER — POTASSIUM CHLORIDE CRYS ER 10 MEQ PO TBCR
10.0000 meq | EXTENDED_RELEASE_TABLET | Freq: Every day | ORAL | Status: DC
  Administered 2017-10-05 – 2017-10-11 (×7): 10 meq via ORAL
  Filled 2017-10-05 (×7): qty 1

## 2017-10-05 NOTE — Progress Notes (Signed)
Received report of patient's temp at 101.8. Went to patient's room and re-assessed temp-it is 100.0

## 2017-10-05 NOTE — Progress Notes (Signed)
Ref: Kirsten Dials, MD   Subjective:  Feeling same. Right sided weakness persist. T max 100.4 degree F.   Objective:  Vital Signs in the last 24 hours: Temp:  [98.3 F (36.8 C)-100.4 F (38 C)] 99.1 F (37.3 C) (11/13 0513) Pulse Rate:  [75-85] 85 (11/13 0513) Cardiac Rhythm: Normal sinus rhythm (11/13 0700) Resp:  [18-20] 20 (11/13 0513) BP: (121-156)/(53-76) 156/76 (11/13 0513) SpO2:  [94 %-97 %] 96 % (11/13 0833) Weight:  [100.9 kg (222 lb 7.1 oz)] 100.9 kg (222 lb 7.1 oz) (11/13 0513)  Physical Exam: BP Readings from Last 1 Encounters:  10/05/17 (!) 156/76     Wt Readings from Last 1 Encounters:  10/05/17 100.9 kg (222 lb 7.1 oz)    Weight change:  Body mass index is 34.84 kg/m. HEENT: [place]/AT, Eyes-Brown, PERL, EOMI, Conjunctiva-Pink, Sclera-Non-icteric Neck: No JVD, No bruit, Trachea midline. Lungs:  Clear, Bilateral. Cardiac:  Regular rhythm, normal S1 and S2, no S3. II/VI systolic murmur. Abdomen:  Soft, non-tender. BS present. Extremities:  No edema present. No cyanosis. No clubbing. CNS: AxOx3, Cranial nerves grossly intact, moves all 4 extremities. Right sided weakness. Skin: Warm and dry.   Intake/Output from previous day: 11/12 0701 - 11/13 0700 In: 120 [P.O.:120] Out: -     Lab Results: BMET    Component Value Date/Time   NA 132 (L) 10/05/2017 0415   NA 131 (L) 10/04/2017 1004   NA 133 (L) 10/03/2017 0326   NA 131 (L) 09/28/2017 1355   K 4.1 10/05/2017 0415   K 3.9 10/04/2017 1004   K 4.3 10/03/2017 0326   K 4.5 09/28/2017 1355   CL 98 (L) 10/05/2017 0415   CL 96 (L) 10/04/2017 1004   CL 100 (L) 10/03/2017 0326   CO2 26 10/05/2017 0415   CO2 27 10/04/2017 1004   CO2 25 10/03/2017 0326   CO2 23 09/28/2017 1355   GLUCOSE 116 (H) 10/05/2017 0415   GLUCOSE 209 (H) 10/04/2017 1004   GLUCOSE 222 (H) 10/03/2017 0326   GLUCOSE 248 (H) 09/28/2017 1355   BUN 20 10/05/2017 0415   BUN 19 10/04/2017 1004   BUN 26 (H) 10/03/2017 0326   BUN 25.7  09/28/2017 1355   CREATININE 1.26 (H) 10/05/2017 0415   CREATININE 1.20 (H) 10/04/2017 1004   CREATININE 1.03 (H) 10/03/2017 0326   CREATININE 1.2 (H) 09/28/2017 1355   CALCIUM 8.5 (L) 10/05/2017 0415   CALCIUM 8.8 (L) 10/04/2017 1004   CALCIUM 9.0 10/03/2017 0326   CALCIUM 9.1 09/28/2017 1355   GFRNONAA 42 (L) 10/05/2017 0415   GFRNONAA 45 (L) 10/04/2017 1004   GFRNONAA 54 (L) 10/03/2017 0326   GFRAA 49 (L) 10/05/2017 0415   GFRAA 52 (L) 10/04/2017 1004   GFRAA >60 10/03/2017 0326   CBC    Component Value Date/Time   WBC 7.9 10/03/2017 0326   RBC 4.20 10/03/2017 0326   HGB 12.4 10/03/2017 0326   HGB 13.4 09/28/2017 1356   HCT 37.3 10/03/2017 0326   HCT 40.3 09/28/2017 1356   PLT 292 10/03/2017 0326   PLT 343 09/28/2017 1356   MCV 88.8 10/03/2017 0326   MCV 87.9 09/28/2017 1356   MCH 29.5 10/03/2017 0326   MCHC 33.2 10/03/2017 0326   RDW 13.5 10/03/2017 0326   RDW 13.7 09/28/2017 1356   LYMPHSABS 3.5 10/02/2017 0634   LYMPHSABS 0.9 09/28/2017 1356   MONOABS 0.8 10/02/2017 0634   MONOABS 0.5 09/28/2017 1356   EOSABS 0.2 10/02/2017 4098  EOSABS 0.0 09/28/2017 1356   BASOSABS 0.0 10/02/2017 0634   BASOSABS 0.1 09/28/2017 1356   HEPATIC Function Panel Recent Labs    09/28/17 1355 10/02/17 0634 10/05/17 0415  PROT 6.5 6.8 5.5*   HEMOGLOBIN A1C No components found for: HGA1C,  MPG CARDIAC ENZYMES Lab Results  Component Value Date   CKTOTAL 95 01/01/2009   CKMB 0.8 01/01/2009   TROPONINI <0.01        NO INDICATION OF MYOCARDIAL INJURY. 01/01/2009   TROPONINI <0.01        NO INDICATION OF MYOCARDIAL INJURY. 01/01/2009   TROPONINI <0.01        NO INDICATION OF MYOCARDIAL INJURY. 12/31/2008   BNP No results for input(s): PROBNP in the last 8760 hours. TSH No results for input(s): TSH in the last 8760 hours. CHOLESTEROL No results for input(s): CHOL in the last 8760 hours.  Scheduled Meds: . amLODipine  5 mg Oral BID  . hydrALAZINE  25 mg Oral BID  .  insulin aspart  0-20 Units Subcutaneous TID WC  . insulin aspart  4 Units Subcutaneous TID WC  . insulin glargine  25 Units Subcutaneous QHS  . metFORMIN  500 mg Oral Q breakfast   And  . linagliptin  5 mg Oral Daily  . losartan  25 mg Oral Daily  . metoprolol succinate  25 mg Oral Daily  . pantoprazole  40 mg Oral Daily  . potassium chloride  10 mEq Oral Daily  . rosuvastatin  10 mg Oral Daily  . umeclidinium bromide  1 puff Inhalation Daily   Continuous Infusions: . sodium chloride 10 mL/hr at 10/04/17 2001  . levETIRAcetam 750 mg (10/04/17 2232)   PRN Meds:.albuterol, ALPRAZolam, gabapentin, HYDROcodone-acetaminophen, loratadine, meclizine, ondansetron, polyethylene glycol  Assessment/Plan: New onset seizure disorder Left frontal lobe hemorrhagic metastatic lesion Recent squamous cell lung cancer with metastasis Hypertension Type 2 DM Obesity History of tobacco use disorder Hyponatremia  Oncology consult. Increase activity. Possible SNF for PT/OT   LOS: 3 days    Kirsten Dials  MD  10/05/2017, 9:28 AM

## 2017-10-05 NOTE — Progress Notes (Signed)
Inpatient Rehabilitation  Per, PT request, patient was screened by Gunnar Fusi for appropriateness for an Inpatient Acute Rehab consult.  At this time we will plan to follow up after OT sees again.  Call if questions.   Carmelia Roller., CCC/SLP Admission Coordinator  [place]  Cell [phone]

## 2017-10-05 NOTE — Progress Notes (Signed)
Physical Therapy Treatment Patient Details Name: [name] MRN: [id number] DOB: [birth year] Today's Date: 10/05/2017    History of Present Illness 71 year old female with recent diagnosis of RUL lung  Nodule with metastatic lesion in left cerebral hemisphere has new onset of RUE becoming heavy and weak. CT head shows new 11 mm hemorrhagic metastatic deposit in left frontal lobe with surrounding edema.    PT Comments    Pt with noted worsening R UE and LE weakness and difficulty clearing R foot during ambulation this date. Pt very motivated. Pt to strongly benefit from CIR upon d/c to address mentioned deficits and progress indep with mobility.   Follow Up Recommendations  CIR     Equipment Recommendations  3in1 (PT)    Recommendations for Other Services       Precautions / Restrictions Precautions Precautions: Fall Restrictions Weight Bearing Restrictions: No    Mobility  Bed Mobility               General bed mobility comments: pt up in chair upon PT arrival  Transfers Overall transfer level: Needs assistance Equipment used: Rolling walker (2 wheeled) Transfers: Sit to/from Stand Sit to Stand: Min assist         General transfer comment: v/c's to push up from chair and reach back for chair when sitting down instead of pulling up on walker or con't to hold walker and sit  Ambulation/Gait Ambulation/Gait assistance: Min assist Ambulation Distance (Feet): 60 Feet Assistive device: Rolling walker (2 wheeled) Gait Pattern/deviations: Decreased dorsiflexion - right;Step-through pattern Gait velocity: slow and guarded Gait velocity interpretation: Below normal speed for age/gender General Gait Details: pt with increased difficulty clearing R foot and sequencing stepping pattern with R foot.   Stairs            Wheelchair Mobility    Modified Rankin (Stroke Patients Only)       Balance Overall balance assessment: Needs assistance Sitting-balance  support: No upper extremity supported;Feet supported Sitting balance-Leahy Scale: Good     Standing balance support: No upper extremity supported;During functional activity Standing balance-Leahy Scale: Poor Standing balance comment: pt requires physical assist at this time                            Cognition Arousal/Alertness: Awake/alert Behavior During Therapy: WFL for tasks assessed/performed Overall Cognitive Status: Impaired/Different from baseline Area of Impairment: Problem solving                             Problem Solving: Slow processing;Difficulty sequencing;Requires verbal cues;Requires tactile cues General Comments: pt with difficulty processing multi-step tasks      Exercises General Exercises - Lower Extremity Ankle Circles/Pumps: AROM;Both;10 reps;Seated Long Arc Quad: AROM;Right;10 reps;Seated(with 5 second hold) Hip Flexion/Marching: AAROM;Right;10 reps;Seated(pt required assist to achieve full ROM) Other Exercises Other Exercises: completed R LE hip flexion in standing x 10 reps    General Comments        Pertinent Vitals/Pain Pain Assessment: No/denies pain Pain Intervention(s): Monitored during session    Home Living                      Prior Function            PT Goals (current goals can now be found in the care plan section) Progress towards PT goals: Not progressing toward goals - comment(pt with  worsening R sided weakness and sequencing)    Frequency    Min 4X/week      PT Plan Frequency needs to be updated;Discharge plan needs to be updated    Co-evaluation              AM-PAC PT "6 Clicks" Daily Activity  Outcome Measure  Difficulty turning over in bed (including adjusting bedclothes, sheets and blankets)?: A Little Difficulty moving from lying on back to sitting on the side of the bed? : A Little Difficulty sitting down on and standing up from a chair with arms (e.g., wheelchair,  bedside commode, etc,.)?: A Lot Help needed moving to and from a bed to chair (including a wheelchair)?: A Lot Help needed walking in hospital room?: A Lot Help needed climbing 3-5 steps with a railing? : A Lot 6 Click Score: 14    End of Session Equipment Utilized During Treatment: Gait belt Activity Tolerance: Patient tolerated treatment well Patient left: in chair;with call bell/phone within reach;with chair alarm set Nurse Communication: Mobility status PT Visit Diagnosis: Unsteadiness on feet (R26.81);Other symptoms and signs involving the nervous system (R29.898)     Time: 6073-7106 PT Time Calculation (min) (ACUTE ONLY): 19 min  Charges:  $Gait Training: 8-22 mins                    G Codes:       Kittie Plater, PT, DPT Pager #: [phone] Office #: [phone]    [place] 10/05/2017, 10:46 AM

## 2017-10-05 NOTE — Progress Notes (Signed)
Occupational Therapy Treatment Patient Details Name: [name] MRN: [id number] DOB: [birth year] Today's Date: 10/05/2017    History of present illness 71 year old female with recent diagnosis of RUL lung  Nodule with metastatic lesion in left cerebral hemisphere has new onset of RUE becoming heavy and weak. CT head shows new 11 mm hemorrhagic metastatic deposit in left frontal lobe with surrounding edema.   OT comments  Pt demonstrating decreased functional performance and functional use of RUE as seen during functional tasks. Pt presenting with increased difficulty with LB dressing and requires increased time and effort. Pt requiring Max cues and Mod A for tub transfer and present with high risk for falls. Pt continues to be highly motivated to participate in therapy stating "I want to do whatever it takes to get better." Update dc recommendation to CIR for intensive therapy to optimize safety and independence with ADLs and functional mobility. Will continue to follow acutely.   Follow Up Recommendations  CIR;Supervision/Assistance - 24 hour    Equipment Recommendations  3 in 1 bedside commode    Recommendations for Other Services PT consult    Precautions / Restrictions Precautions Precautions: Fall Restrictions Weight Bearing Restrictions: No       Mobility Bed Mobility Overal bed mobility: Needs Assistance Bed Mobility: Supine to Sit     Supine to sit: Min assist;HOB elevated     General bed mobility comments: Pt requiring increased assistance compared to prior session with Min A for husband and OT for trunk elevation  Transfers Overall transfer level: Needs assistance Equipment used: Rolling walker (2 wheeled) Transfers: Sit to/from Stand Sit to Stand: Min assist         General transfer comment: Pt requiring several attempts to power into standing. VCs for hand placement and safety. Pt requiring Min A for balance and stability.     Balance Overall balance  assessment: Needs assistance Sitting-balance support: No upper extremity supported;Feet supported Sitting balance-Leahy Scale: Good     Standing balance support: No upper extremity supported;During functional activity Standing balance-Leahy Scale: Poor Standing balance comment: pt requires physical assist at this time                           ADL either performed or assessed with clinical judgement   ADL Overall ADL's : Needs assistance/impaired                 Upper Body Dressing : Minimal assistance;Sitting Upper Body Dressing Details (indicate cue type and reason): Pt requiring Min A to don gown like a jacket while seated Lower Body Dressing: Minimal assistance;Sit to/from stand Lower Body Dressing Details (indicate cue type and reason): Pt demonstrating decreased funcitonal performance for donning socks. Pt with decreased grasp strength and sock slipping out of pt's R hand throughout task.          Tub/ Shower Transfer: Moderate assistance;Ambulation;Tub transfer;3 in 1;Rolling walker;Cueing for sequencing;Cueing for safety Tub/Shower Transfer Details (indicate cue type and reason): Pt requiring Max VCs and Mod physical A for tub transafer. Pt dmeosntrating poor sequencing and motor planning.  Functional mobility during ADLs: Rolling walker;Cueing for sequencing;Cueing for safety;Minimal assistance(Difficulty placing RUE onto RW) General ADL Comments: Pt continues to present with decreased process and motor planning. Pt requiring Max cues for sequencing tub transfer and Min-Mod physical A for safety. Pt reuqiring increased time and effort for donning socks and present with poor grasp stength and sock slipping out of  pt's grasp.      Vision   Additional Comments: Pt continues to present with neglect and bumping into object on R side during functional mobility.    Perception     Praxis      Cognition Arousal/Alertness: Awake/alert Behavior During Therapy:  WFL for tasks assessed/performed Overall Cognitive Status: Impaired/Different from baseline Area of Impairment: Problem solving                             Problem Solving: Slow processing;Difficulty sequencing;Requires verbal cues;Requires tactile cues General Comments: pt with difficulty processing multi-step tasks        Exercises    Shoulder Instructions       General Comments Pt husband present    Pertinent Vitals/ Pain       Pain Assessment: No/denies pain Pain Intervention(s): Monitored during session  Home Living                                          Prior Functioning/Environment              Frequency  Min 3X/week        Progress Toward Goals  OT Goals(current goals can now be found in the care plan section)  Progress towards OT goals: Progressing toward goals  Acute Rehab OT Goals Patient Stated Goal: Go home OT Goal Formulation: With patient/family Time For Goal Achievement: 10/18/17 Potential to Achieve Goals: Good ADL Goals Pt Will Perform Grooming: with modified independence;standing Pt Will Perform Lower Body Dressing: with set-up;with supervision;sit to/from stand Pt Will Transfer to Toilet: with set-up;with supervision;bedside commode;ambulating Pt Will Perform Tub/Shower Transfer: Tub transfer;3 in 1;rolling walker;ambulating;with supervision;with set-up  Plan Discharge plan needs to be updated    Co-evaluation                 AM-PAC PT "6 Clicks" Daily Activity     Outcome Measure   Help from another person eating meals?: None Help from another person taking care of personal grooming?: A Little Help from another person toileting, which includes using toliet, bedpan, or urinal?: A Little Help from another person bathing (including washing, rinsing, drying)?: A Lot Help from another person to put on and taking off regular upper body clothing?: A Little Help from another person to put on and  taking off regular lower body clothing?: A Little 6 Click Score: 18    End of Session Equipment Utilized During Treatment: Gait belt;Rolling walker  OT Visit Diagnosis: Unsteadiness on feet (R26.81);Other abnormalities of gait and mobility (R26.89);Muscle weakness (generalized) (M62.81);Other symptoms and signs involving cognitive function;Hemiplegia and hemiparesis Hemiplegia - Right/Left: Right Hemiplegia - dominant/non-dominant: Dominant Hemiplegia - caused by: Cerebral infarction   Activity Tolerance Patient tolerated treatment well   Patient Left in chair;with call bell/phone within reach(with PT)   Nurse Communication Mobility status        Time: 0932-1000 OT Time Calculation (min): 28 min  Charges: OT General Charges $OT Visit: 1 Visit OT Treatments $Self Care/Home Management : 23-37 mins  [place], OTR/L Acute Rehab Pager: [phone] Office: [place] 10/05/2017, 11:54 AM

## 2017-10-06 LAB — GLUCOSE, CAPILLARY
GLUCOSE-CAPILLARY: 90 mg/dL (ref 65–99)
GLUCOSE-CAPILLARY: 134 mg/dL — AB (ref 65–99)
GLUCOSE-CAPILLARY: 218 mg/dL — AB (ref 65–99)
Glucose-Capillary: 128 mg/dL — ABNORMAL HIGH (ref 65–99)
Glucose-Capillary: 157 mg/dL — ABNORMAL HIGH (ref 65–99)

## 2017-10-06 MED ORDER — LEVETIRACETAM 750 MG PO TABS
750.0000 mg | ORAL_TABLET | Freq: Two times a day (BID) | ORAL | Status: DC
  Administered 2017-10-06 – 2017-10-10 (×8): 750 mg via ORAL
  Filled 2017-10-06 (×9): qty 1

## 2017-10-06 MED ORDER — DEXTROSE 5 % IV SOLN
1.0000 g | INTRAVENOUS | Status: DC
  Administered 2017-10-06 – 2017-10-11 (×6): 1 g via INTRAVENOUS
  Filled 2017-10-06 (×6): qty 10

## 2017-10-06 NOTE — Progress Notes (Signed)
Ref: Dixie Dials, MD   Subjective:  Increased right sided weakness. Unable to walk today.  Objective:  Vital Signs in the last 24 hours: Temp:  [99 F (37.2 C)-101.8 F (38.8 C)] 99 F (37.2 C) (11/14 0913) Pulse Rate:  [71-85] 85 (11/14 0913) Cardiac Rhythm: Normal sinus rhythm (11/13 1900) Resp:  [16-20] 17 (11/14 0913) BP: (114-128)/(48-72) 128/48 (11/14 0913) SpO2:  [93 %-94 %] 94 % (11/14 0913)  Physical Exam: BP Readings from Last 1 Encounters:  10/06/17 (!) 128/48     Wt Readings from Last 1 Encounters:  10/05/17 100.9 kg (222 lb 7.1 oz)    Weight change:  Body mass index is 34.84 kg/m. HEENT: Smock/AT, Eyes-Brown, PERL, EOMI, Conjunctiva-Pink, Sclera-Non-icteric Neck: No JVD, No bruit, Trachea midline. Lungs:  Clear, Bilateral. Cardiac:  Regular rhythm, normal S1 and S2, no S3. II/VI systolic murmur. Abdomen:  Soft, non-tender. BS present. Extremities:  No edema present. No cyanosis. No clubbing. CNS: AxOx3, Cranial nerves grossly intact, Right sided weakness with almost flaccid right lower leg.  Skin: Warm and dry.   Intake/Output from previous day: No intake/output data recorded.    Lab Results: BMET    Component Value Date/Time   NA 132 (L) 10/05/2017 0415   NA 131 (L) 10/04/2017 1004   NA 133 (L) 10/03/2017 0326   NA 131 (L) 09/28/2017 1355   K 4.1 10/05/2017 0415   K 3.9 10/04/2017 1004   K 4.3 10/03/2017 0326   K 4.5 09/28/2017 1355   CL 98 (L) 10/05/2017 0415   CL 96 (L) 10/04/2017 1004   CL 100 (L) 10/03/2017 0326   CO2 26 10/05/2017 0415   CO2 27 10/04/2017 1004   CO2 25 10/03/2017 0326   CO2 23 09/28/2017 1355   GLUCOSE 116 (H) 10/05/2017 0415   GLUCOSE 209 (H) 10/04/2017 1004   GLUCOSE 222 (H) 10/03/2017 0326   GLUCOSE 248 (H) 09/28/2017 1355   BUN 20 10/05/2017 0415   BUN 19 10/04/2017 1004   BUN 26 (H) 10/03/2017 0326   BUN 25.7 09/28/2017 1355   CREATININE 1.26 (H) 10/05/2017 0415   CREATININE 1.20 (H) 10/04/2017 1004    CREATININE 1.03 (H) 10/03/2017 0326   CREATININE 1.2 (H) 09/28/2017 1355   CALCIUM 8.5 (L) 10/05/2017 0415   CALCIUM 8.8 (L) 10/04/2017 1004   CALCIUM 9.0 10/03/2017 0326   CALCIUM 9.1 09/28/2017 1355   GFRNONAA 42 (L) 10/05/2017 0415   GFRNONAA 45 (L) 10/04/2017 1004   GFRNONAA 54 (L) 10/03/2017 0326   GFRAA 49 (L) 10/05/2017 0415   GFRAA 52 (L) 10/04/2017 1004   GFRAA >60 10/03/2017 0326   CBC    Component Value Date/Time   WBC 7.9 10/03/2017 0326   RBC 4.20 10/03/2017 0326   HGB 12.4 10/03/2017 0326   HGB 13.4 09/28/2017 1356   HCT 37.3 10/03/2017 0326   HCT 40.3 09/28/2017 1356   PLT 292 10/03/2017 0326   PLT 343 09/28/2017 1356   MCV 88.8 10/03/2017 0326   MCV 87.9 09/28/2017 1356   MCH 29.5 10/03/2017 0326   MCHC 33.2 10/03/2017 0326   RDW 13.5 10/03/2017 0326   RDW 13.7 09/28/2017 1356   LYMPHSABS 3.5 10/02/2017 0634   LYMPHSABS 0.9 09/28/2017 1356   MONOABS 0.8 10/02/2017 0634   MONOABS 0.5 09/28/2017 1356   EOSABS 0.2 10/02/2017 0634   EOSABS 0.0 09/28/2017 1356   BASOSABS 0.0 10/02/2017 0634   BASOSABS 0.1 09/28/2017 1356   HEPATIC Function Panel Recent Labs  09/28/17 1355 10/02/17 0634 10/05/17 0415  PROT 6.5 6.8 5.5*   HEMOGLOBIN A1C No components found for: HGA1C,  MPG CARDIAC ENZYMES Lab Results  Component Value Date   CKTOTAL 95 01/01/2009   CKMB 0.8 01/01/2009   TROPONINI <0.01        NO INDICATION OF MYOCARDIAL INJURY. 01/01/2009   TROPONINI <0.01        NO INDICATION OF MYOCARDIAL INJURY. 01/01/2009   TROPONINI <0.01        NO INDICATION OF MYOCARDIAL INJURY. 12/31/2008   BNP No results for input(s): PROBNP in the last 8760 hours. TSH No results for input(s): TSH in the last 8760 hours. CHOLESTEROL No results for input(s): CHOL in the last 8760 hours.  Scheduled Meds: . amLODipine  5 mg Oral BID  . hydrALAZINE  25 mg Oral BID  . insulin aspart  0-20 Units Subcutaneous TID WC  . insulin aspart  4 Units Subcutaneous TID WC   . insulin glargine  25 Units Subcutaneous QHS  . metFORMIN  500 mg Oral Q breakfast   And  . linagliptin  5 mg Oral Daily  . losartan  25 mg Oral Daily  . metoprolol succinate  25 mg Oral Daily  . pantoprazole  40 mg Oral Daily  . potassium chloride  10 mEq Oral Daily  . rosuvastatin  10 mg Oral Daily  . umeclidinium bromide  1 puff Inhalation Daily   Continuous Infusions: . sodium chloride 10 mL/hr at 10/04/17 2001  . 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 Continue PT/OT.   LOS: 4 days    Dixie Dials  MD  10/06/2017, 10:33 AM

## 2017-10-06 NOTE — Care Management Important Message (Signed)
Important Message  Patient Details  Name: [name] MRN: [id number] Date of Birth: 1946-10-08   Medicare Important Message Given:  Yes    Nathen Sharmel 10/06/2017, 10:32 AM

## 2017-10-06 NOTE — Progress Notes (Signed)
Patient's temp is 102.5. MD notified MD requested orders for  UA, IV Rocephine 1G Q-daily, Blood culture X2, CBC & BMP in the morning. Will continue to monitor

## 2017-10-06 NOTE — Progress Notes (Signed)
Physical Therapy Treatment Patient Details Name: [name] MRN: [id number] DOB: February 10, 1946 Today's Date: 10/06/2017    History of Present Illness 71 year old female with recent diagnosis of RUL lung  Nodule with metastatic lesion in left cerebral hemisphere has new onset of RUE becoming heavy and weak. CT head shows new 11 mm hemorrhagic metastatic deposit in left frontal lobe with surrounding edema.    PT Comments    Pt with increased R sided weakness limiting mobility and difficulty in following multistep commands. Pt now requiring modA for bed mobility and maxA for sit to stand and stand pivot with RW. Pt encouraged to continue strengthening exercises while sitting in recliner. Pt is very motivated to participate and is frustrated by her current abilities. Pt requires skilled PT in the acute setting to progress mobility and improve strength and endurance to safely navigate their discharge environment.    Follow Up Recommendations  CIR     Equipment Recommendations  3in1 (PT)    Recommendations for Other Services       Precautions / Restrictions Precautions Precautions: Fall Restrictions Weight Bearing Restrictions: No    Mobility  Bed Mobility Overal bed mobility: Needs Assistance Bed Mobility: Supine to Sit     Supine to sit: Mod assist;HOB elevated     General bed mobility comments: Pt required modA for LE managment off of bed, trunk to upright and pad scoot of hips to EoB  Transfers Overall transfer level: Needs assistance Equipment used: Rolling walker (2 wheeled) Transfers: Sit to/from [facility] Sit to Stand: Max assist         General transfer comment: max A for power up to standing, once in standing able to steady, vc for anterior pelvic rotation to attain upright, pt unable to perform hip, knee flexion to be able to lift R LE off of floor, pt sat back down and recliner readjusted to perform stand pivot transfer again required maxA to  come to upright and maximal verbal and tactile cuing to pivot to the recliner, pt not able to weightshift to L to offweight R side  Ambulation/Gait             General Gait Details: unable to attempt due to increased weakness       Balance Overall balance assessment: Needs assistance Sitting-balance support: No upper extremity supported;Feet supported Sitting balance-Leahy Scale: Good     Standing balance support: No upper extremity supported;During functional activity Standing balance-Leahy Scale: Poor Standing balance comment: pt requires physical assist at this time                            Cognition Arousal/Alertness: Awake/alert Behavior During Therapy: WFL for tasks assessed/performed Overall Cognitive Status: Impaired/Different from baseline Area of Impairment: Problem solving                             Problem Solving: Slow processing;Difficulty sequencing;Requires verbal cues;Requires tactile cues General Comments: pt with difficulty processing multi-step tasks      Exercises General Exercises - Lower Extremity Ankle Circles/Pumps: AROM;Both;10 reps;Seated Long Arc Quad: AROM;Right;10 reps;Seated(with 5 second hold) Hip Flexion/Marching: AAROM;Right;10 reps;Seated(pt required assist to achieve full ROM)    General Comments General comments (skin integrity, edema, etc.): physician entered during transfer to chair and witnessed increased weakness       Pertinent Vitals/Pain Pain Assessment: No/denies pain Pain Intervention(s): Monitored during session;Limited  activity within patient's tolerance           PT Goals (current goals can now be found in the care plan section) Acute Rehab PT Goals PT Goal Formulation: With patient Time For Goal Achievement: 10/11/17 Potential to Achieve Goals: Poor Progress towards PT goals: Not progressing toward goals - comment(pt has increased R sided weakness and difficulty following )     Frequency    Min 4X/week      PT Plan Frequency needs to be updated;Discharge plan needs to be updated       AM-PAC PT "6 Clicks" Daily Activity  Outcome Measure  Difficulty turning over in bed (including adjusting bedclothes, sheets and blankets)?: A Lot Difficulty moving from lying on back to sitting on the side of the bed? : Unable Difficulty sitting down on and standing up from a chair with arms (e.g., wheelchair, bedside commode, etc,.)?: Unable Help needed moving to and from a bed to chair (including a wheelchair)?: A Lot Help needed walking in hospital room?: Total Help needed climbing 3-5 steps with a railing? : Total 6 Click Score: 8    End of Session Equipment Utilized During Treatment: Gait belt Activity Tolerance: (pt limited by increased weakness) Patient left: in chair;with call bell/phone within reach;with chair alarm set Nurse Communication: Mobility status PT Visit Diagnosis: Unsteadiness on feet (R26.81);Other symptoms and signs involving the nervous system (Z61.096)     Time: 0454-0981 PT Time Calculation (min) (ACUTE ONLY): 38 min  Charges:  $Therapeutic Activity: 38-52 mins                    G Codes:       Cristhian Vanhook B. Migdalia Dk PT, DPT Acute Rehabilitation  [phone] Pager [phone]     [place] 10/06/2017, 10:55 AM

## 2017-10-07 ENCOUNTER — Inpatient Hospital Stay (HOSPITAL_COMMUNITY): Payer: Medicare HMO

## 2017-10-07 DIAGNOSIS — G939 Disorder of brain, unspecified: Secondary | ICD-10-CM

## 2017-10-07 DIAGNOSIS — G8191 Hemiplegia, unspecified affecting right dominant side: Secondary | ICD-10-CM

## 2017-10-07 LAB — GLUCOSE, CAPILLARY
GLUCOSE-CAPILLARY: 109 mg/dL — AB (ref 65–99)
GLUCOSE-CAPILLARY: 158 mg/dL — AB (ref 65–99)
GLUCOSE-CAPILLARY: 159 mg/dL — AB (ref 65–99)
GLUCOSE-CAPILLARY: 124 mg/dL — AB (ref 65–99)

## 2017-10-07 LAB — URINE CULTURE

## 2017-10-07 LAB — BASIC METABOLIC PANEL
Anion gap: 7 (ref 5–15)
BUN: 14 mg/dL (ref 6–20)
CALCIUM: 8.6 mg/dL — AB (ref 8.9–10.3)
CO2: 25 mmol/L (ref 22–32)
Chloride: 102 mmol/L (ref 101–111)
Creatinine, Ser: 1.05 mg/dL — ABNORMAL HIGH (ref 0.44–1.00)
GFR calc Af Amer: >60 mL/min (ref >60)
GFR, EST NON AFRICAN AMERICAN: 53 mL/min — AB (ref >60)
GLUCOSE: 111 mg/dL — AB (ref 65–99)
POTASSIUM: 4 mmol/L (ref 3.5–5.1)
SODIUM: 134 mmol/L — AB (ref 135–145)

## 2017-10-07 LAB — CBC
HCT: 40.1 % (ref 36.0–46.0)
Hemoglobin: 13.2 g/dL (ref 12.0–15.0)
MCH: 29.3 pg (ref 26.0–34.0)
MCHC: 32.9 g/dL (ref 30.0–36.0)
MCV: 88.9 fL (ref 78.0–100.0)
PLATELETS: 218 10*3/uL (ref 150–400)
RBC: 4.51 MIL/uL (ref 3.87–5.11)
RDW: 13.3 % (ref 11.5–15.5)
WBC: 6.5 10*3/uL (ref 4.0–10.5)

## 2017-10-07 NOTE — Progress Notes (Signed)
Physical Therapy Treatment Patient Details Name: [name] MRN: [id number] DOB: 1946/06/14 Today's Date: 10/07/2017    History of Present Illness 71 year old female with recent diagnosis of RUL lung  Nodule with metastatic lesion in left cerebral hemisphere has new onset of RUE becoming heavy and weak. CT head shows new 11 mm hemorrhagic metastatic deposit in left frontal lobe with surrounding edema.    PT Comments    Pt continues to be very agreeable with therapy sessions despite increasing R-sided weakness. Pt mobility is also limited by decreased carry over of cues for standing from one attempt to the next. Pt with no palpable activation of R LE muscles in seated. Pt currently modA for bed mobility with heavy reliance on bedrail, and modAx2 for 5x sit<>stand in [place]. D/c plan continues to be appropriate. PT will continue to follow acutely.    Follow Up Recommendations  CIR     Equipment Recommendations  3in1 (PT)    Recommendations for Other Services       Precautions / Restrictions Precautions Precautions: Fall Restrictions Weight Bearing Restrictions: No    Mobility  Bed Mobility Overal bed mobility: Needs Assistance Bed Mobility: Supine to Sit     Supine to sit: Mod assist;HOB elevated     General bed mobility comments: Pt required modA for LE managment off of bed, trunk to upright and pad scoot of hips to EoB  Transfers Overall transfer level: Needs assistance Equipment used: Rolling walker (2 wheeled) Transfers: Sit to/from [facility] Sit to Stand: Mod assist;+2 physical assistance         General transfer comment: modAx2 for 5x sit<>stand, vc for utilizing L sided strength and keeping weight centered or over L hip combined with anterior pelvic tilt to help with upright posture, physical blocking of R knee aided in coming to full upright.    Ambulation/Gait             General Gait Details: unable to attempt due to increased  weakness       Balance Overall balance assessment: Needs assistance Sitting-balance support: No upper extremity supported;Feet supported Sitting balance-Leahy Scale: Good     Standing balance support: Bilateral upper extremity supported Standing balance-Leahy Scale: Poor Standing balance comment: pt requires physical assist at this time                            Cognition Arousal/Alertness: Awake/alert Behavior During Therapy: WFL for tasks assessed/performed Overall Cognitive Status: Impaired/Different from baseline Area of Impairment: Problem solving;Memory                     Memory: Decreased short-term memory       Problem Solving: Slow processing;Difficulty sequencing;Requires verbal cues;Requires tactile cues General Comments: pt with difficulty processing multi-step tasks and with remembering cues to keep weight L hip with standing up       Exercises General Exercises - Lower Extremity Long Arc Quad: Right;Seated;PROM Hip Flexion/Marching: Right;Seated;PROM    General Comments General comments (skin integrity, edema, etc.): Pt with continued decline in pt's R sided strength, pt unable to initiate L LE movement in hip, knee or foot at time of session       Pertinent Vitals/Pain Pain Assessment: Faces Faces Pain Scale: Hurts even more Pain Location: R shoulder pain  Pain Descriptors / Indicators: Burning;Grimacing;Guarding;Sore;Tender Pain Intervention(s): Limited activity within patient's tolerance;Monitored during session;Premedicated before session  PT Goals (current goals can now be found in the care plan section) Acute Rehab PT Goals PT Goal Formulation: With patient Time For Goal Achievement: 10/11/17 Potential to Achieve Goals: Poor Progress towards PT goals: Not progressing toward goals - comment(pt with worsening R-sided weakness )    Frequency    Min 4X/week      PT Plan Frequency needs to be  updated;Discharge plan needs to be updated    Co-evaluation PT/OT/SLP Co-Evaluation/Treatment: Yes Reason for Co-Treatment: Complexity of the patient's impairments (multi-system involvement);For patient/therapist safety;To address functional/ADL transfers PT goals addressed during session: Mobility/safety with mobility;Strengthening/ROM OT goals addressed during session: Strengthening/ROM      AM-PAC PT "6 Clicks" Daily Activity  Outcome Measure  Difficulty turning over in bed (including adjusting bedclothes, sheets and blankets)?: A Lot Difficulty moving from lying on back to sitting on the side of the bed? : Unable Difficulty sitting down on and standing up from a chair with arms (e.g., wheelchair, bedside commode, etc,.)?: Unable Help needed moving to and from a bed to chair (including a wheelchair)?: A Lot Help needed walking in hospital room?: Total Help needed climbing 3-5 steps with a railing? : Total 6 Click Score: 8    End of Session Equipment Utilized During Treatment: Gait belt Activity Tolerance: (pt limited by increased weakness) Patient left: in chair;with call bell/phone within reach;with chair alarm set Nurse Communication: Mobility status PT Visit Diagnosis: Unsteadiness on feet (R26.81);Other symptoms and signs involving the nervous system (J09.326)     Time: 1337-1400 PT Time Calculation (min) (ACUTE ONLY): 23 min  Charges:  $Therapeutic Activity: 8-22 mins                    G Codes:       Morio Widen B. Migdalia Dk PT, DPT Acute Rehabilitation  [phone] Pager [phone]     [place] 10/07/2017, 2:37 PM

## 2017-10-07 NOTE — Progress Notes (Signed)
Placed call to Dr Joline Salt' service. They will return my page.

## 2017-10-07 NOTE — Progress Notes (Signed)
Dr Debbora Dus called and asked me to put order in for shoulder x ray. Will inform patient of this.

## 2017-10-07 NOTE — Progress Notes (Signed)
Ref: Dixie Dials, MD   Subjective:  Feeling better. No chest pain. Right sided weakness persist and changes in intensity at times. Appreciate [facility] inpatient rehab doctor and service. T max 101.9 with periodic cough.  Objective:  Vital Signs in the last 24 hours: Temp:  [99 F (37.2 C)-101.9 F (38.8 C)] 99.8 F (37.7 C) (11/15 1700) Pulse Rate:  [55-88] 86 (11/15 1700) Cardiac Rhythm: Normal sinus rhythm (11/15 0815) Resp:  [17-20] 20 (11/15 1700) BP: (112-139)/(53-62) 120/58 (11/15 1700) SpO2:  [91 %-95 %] 95 % (11/15 1700) Weight:  [105.8 kg (233 lb 4 oz)] 105.8 kg (233 lb 4 oz) (11/15 0500)  Physical Exam: BP Readings from Last 1 Encounters:  10/07/17 (!) 120/58     Wt Readings from Last 1 Encounters:  10/07/17 105.8 kg (233 lb 4 oz)    Weight change:  Body mass index is 36.53 kg/m. HEENT: [place]/AT, Eyes-Brown, PERL, EOMI, Conjunctiva-Pink, Sclera-Non-icteric Neck: No JVD, No bruit, Trachea midline. Lungs:  Clear, Bilateral. Cardiac:  Regular rhythm, normal S1 and S2, no S3. II/VI systolic murmur. Abdomen:  Soft, non-tender. BS present. Extremities:  No edema present. No cyanosis. No clubbing. CNS: AxOx3, Cranial nerves grossly intact, moves all 4 extremities. Almost flaccid right upper and lower extremity. Skin: Warm and dry.   Intake/Output from previous day: 11/14 0701 - 11/15 0700 In: 1470.5 [P.O.:350; I.V.:1070.5; IV Piggyback:50] Out: 200 [Urine:200]    Lab Results: BMET    Component Value Date/Time   NA 134 (L) 10/07/2017 0341   NA 132 (L) 10/05/2017 0415   NA 131 (L) 10/04/2017 1004   NA 131 (L) 09/28/2017 1355   K 4.0 10/07/2017 0341   K 4.1 10/05/2017 0415   K 3.9 10/04/2017 1004   K 4.5 09/28/2017 1355   CL 102 10/07/2017 0341   CL 98 (L) 10/05/2017 0415   CL 96 (L) 10/04/2017 1004   CO2 25 10/07/2017 0341   CO2 26 10/05/2017 0415   CO2 27 10/04/2017 1004   CO2 23 09/28/2017 1355   GLUCOSE 111 (H) 10/07/2017 0341   GLUCOSE 116 (H)  10/05/2017 0415   GLUCOSE 209 (H) 10/04/2017 1004   GLUCOSE 248 (H) 09/28/2017 1355   BUN 14 10/07/2017 0341   BUN 20 10/05/2017 0415   BUN 19 10/04/2017 1004   BUN 25.7 09/28/2017 1355   CREATININE 1.05 (H) 10/07/2017 0341   CREATININE 1.26 (H) 10/05/2017 0415   CREATININE 1.20 (H) 10/04/2017 1004   CREATININE 1.2 (H) 09/28/2017 1355   CALCIUM 8.6 (L) 10/07/2017 0341   CALCIUM 8.5 (L) 10/05/2017 0415   CALCIUM 8.8 (L) 10/04/2017 1004   CALCIUM 9.1 09/28/2017 1355   GFRNONAA 53 (L) 10/07/2017 0341   GFRNONAA 42 (L) 10/05/2017 0415   GFRNONAA 45 (L) 10/04/2017 1004   GFRAA >60 10/07/2017 0341   GFRAA 49 (L) 10/05/2017 0415   GFRAA 52 (L) 10/04/2017 1004   CBC    Component Value Date/Time   WBC 6.5 10/07/2017 0341   RBC 4.51 10/07/2017 0341   HGB 13.2 10/07/2017 0341   HGB 13.4 09/28/2017 1356   HCT 40.1 10/07/2017 0341   HCT 40.3 09/28/2017 1356   PLT 218 10/07/2017 0341   PLT 343 09/28/2017 1356   MCV 88.9 10/07/2017 0341   MCV 87.9 09/28/2017 1356   MCH 29.3 10/07/2017 0341   MCHC 32.9 10/07/2017 0341   RDW 13.3 10/07/2017 0341   RDW 13.7 09/28/2017 1356   LYMPHSABS 3.5 10/02/2017 0634   LYMPHSABS  0.9 09/28/2017 1356   MONOABS 0.8 10/02/2017 0634   MONOABS 0.5 09/28/2017 1356   EOSABS 0.2 10/02/2017 0634   EOSABS 0.0 09/28/2017 1356   BASOSABS 0.0 10/02/2017 0634   BASOSABS 0.1 09/28/2017 1356   HEPATIC Function Panel Recent Labs    09/28/17 1355 10/02/17 0634 10/05/17 0415  PROT 6.5 6.8 5.5*   HEMOGLOBIN A1C No components found for: HGA1C,  MPG CARDIAC ENZYMES Lab Results  Component Value Date   CKTOTAL 95 01/01/2009   CKMB 0.8 01/01/2009   TROPONINI <0.01        NO INDICATION OF MYOCARDIAL INJURY. 01/01/2009   TROPONINI <0.01        NO INDICATION OF MYOCARDIAL INJURY. 01/01/2009   TROPONINI <0.01        NO INDICATION OF MYOCARDIAL INJURY. 12/31/2008   BNP No results for input(s): PROBNP in the last 8760 hours. TSH No results for input(s):  TSH in the last 8760 hours. CHOLESTEROL No results for input(s): CHOL in the last 8760 hours.  Scheduled Meds: . amLODipine  5 mg Oral BID  . hydrALAZINE  25 mg Oral BID  . insulin aspart  0-20 Units Subcutaneous TID WC  . insulin aspart  4 Units Subcutaneous TID WC  . insulin glargine  25 Units Subcutaneous QHS  . levETIRAcetam  750 mg Oral BID  . metFORMIN  500 mg Oral Q breakfast   And  . linagliptin  5 mg Oral Daily  . losartan  25 mg Oral Daily  . metoprolol succinate  25 mg Oral Daily  . pantoprazole  40 mg Oral Daily  . potassium chloride  10 mEq Oral Daily  . rosuvastatin  10 mg Oral Daily  . umeclidinium bromide  1 puff Inhalation Daily   Continuous Infusions: . sodium chloride 10 mL/hr at 10/04/17 2001  . 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   LOS: 5 days    Dixie Dials  MD  10/07/2017, 5:58 PM

## 2017-10-07 NOTE — Progress Notes (Signed)
Inpatient Rehabilitation  Patient was re-screened by Gunnar Fusi for appropriateness for an Inpatient Acute Rehab consult.  Note therapy has changed post acute rehab recommendations to CIR.  At this time we are recommending an Inpatient Rehab consult.  Noted that attending is in agreement with plan per 11/14 note; will place order.  Carmelia Roller., CCC/SLP Admission Coordinator  [place]  Cell [phone]

## 2017-10-07 NOTE — Progress Notes (Signed)
Inpatient Rehabilitation  Met with patient to discuss team's recommendation for IP Rehab.  Shared booklets and answered initial questions.  Patient is eager to regain her independence and reports that she has good caregiver support from her daughter while her husband works.  I have left a message for her husband and await a return call to confirm.  Will initiate insurance authorization.  Discussed with nurse case manager.  Call if questions.   Carmelia Roller., CCC/SLP Admission Coordinator  [place]  Cell [phone]

## 2017-10-07 NOTE — Progress Notes (Signed)
Occupational Therapy Treatment Patient Details Name: [name] MRN: [id number] DOB: [birth year] Today's Date: 10/07/2017    History of present illness 71 year old female with recent diagnosis of RUL lung  Nodule with metastatic lesion in left cerebral hemisphere has new onset of RUE becoming heavy and weak. CT head shows new 11 mm hemorrhagic metastatic deposit in left frontal lobe with surrounding edema.   OT comments  Pt with increased R sided weakness this session; trace movement noted in pts RUE and pt c/o R shoulder pain. Pt tolerated controlled sit to stand x5 with Stedy and mod assist +2. Pt able to perform weight shifts in standing with cues for posture and blocking R knee in extension. D/c plan remains appropriate. Will continue to follow acutely.   Follow Up Recommendations  CIR;Supervision/Assistance - 24 hour    Equipment Recommendations  3 in 1 bedside commode    Recommendations for Other Services      Precautions / Restrictions Precautions Precautions: Fall Restrictions Weight Bearing Restrictions: No       Mobility Bed Mobility Overal bed mobility: Needs Assistance Bed Mobility: Supine to Sit     Supine to sit: Mod assist;HOB elevated     General bed mobility comments: Pt required modA for LE managment off of bed, trunk to upright and pad scoot of hips to EoB  Transfers Overall transfer level: Needs assistance Equipment used: Rolling walker (2 wheeled) Transfers: Sit to/from [facility] Sit to Stand: Mod assist;+2 physical assistance         General transfer comment: modAx2 for 5x sit<>stand, vc for utilizing L sided strength and keeping weight centered or over L hip combined with anterior pelvic tilt to help with upright posture, physical blocking of R knee aided in coming to full upright.      Balance Overall balance assessment: Needs assistance Sitting-balance support: No upper extremity supported;Feet supported Sitting  balance-Leahy Scale: Good     Standing balance support: Bilateral upper extremity supported Standing balance-Leahy Scale: Poor Standing balance comment: pt requires physical assist at this time                           ADL either performed or assessed with clinical judgement   ADL                                               Vision       Perception     Praxis      Cognition Arousal/Alertness: Awake/alert Behavior During Therapy: WFL for tasks assessed/performed Overall Cognitive Status: Impaired/Different from baseline Area of Impairment: Problem solving;Memory                     Memory: Decreased short-term memory       Problem Solving: Slow processing;Difficulty sequencing;Requires verbal cues;Requires tactile cues General Comments: pt with difficulty processing multi-step tasks and with remembering cues to keep weight L hip with standing up         Exercises Exercises: Other exercises   Other Exercises Other Exercises: Pt able to perform sit to stand from EOB x5 with Stedy. Performed weight shifts in standing with knee blocked in extension.   Shoulder Instructions       General Comments     Pertinent Vitals/ Pain  Pain Assessment: Faces Faces Pain Scale: Hurts even more Pain Location: R shoulder pain  Pain Descriptors / Indicators: Burning;Grimacing;Guarding;Sore;Tender Pain Intervention(s): Limited activity within patient's tolerance;Monitored during session;Repositioned;Premedicated before session  Home Living                                          Prior Functioning/Environment              Frequency  Min 3X/week        Progress Toward Goals  OT Goals(current goals can now be found in the care plan section)  Progress towards OT goals: Not progressing toward goals - comment(decline in functional status)  Acute Rehab OT Goals Patient Stated Goal: Go home OT Goal  Formulation: With patient  Plan Discharge plan remains appropriate    Co-evaluation    PT/OT/SLP Co-Evaluation/Treatment: Yes Reason for Co-Treatment: Complexity of the patient's impairments (multi-system involvement) PT goals addressed during session: Mobility/safety with mobility;Strengthening/ROM OT goals addressed during session: Strengthening/ROM      AM-PAC PT "6 Clicks" Daily Activity     Outcome Measure   Help from another person eating meals?: A Little Help from another person taking care of personal grooming?: A Little Help from another person toileting, which includes using toliet, bedpan, or urinal?: A Lot Help from another person bathing (including washing, rinsing, drying)?: A Lot Help from another person to put on and taking off regular upper body clothing?: A Lot Help from another person to put on and taking off regular lower body clothing?: A Lot 6 Click Score: 14    End of Session Equipment Utilized During Treatment: Gait belt;Other (comment)(Stedy)  OT Visit Diagnosis: Unsteadiness on feet (R26.81);Other abnormalities of gait and mobility (R26.89);Muscle weakness (generalized) (M62.81);Other symptoms and signs involving cognitive function;Hemiplegia and hemiparesis Hemiplegia - Right/Left: Right Hemiplegia - dominant/non-dominant: Dominant Hemiplegia - caused by: Cerebral infarction   Activity Tolerance Patient tolerated treatment well   Patient Left in chair;with call bell/phone within reach;with chair alarm set   Nurse Communication Mobility status        Time: 7412-8786 OT Time Calculation (min): 30 min  Charges: OT General Charges $OT Visit: 1 Visit OT Treatments $Therapeutic Activity: 8-22 mins  Tashima Scarpulla A. Ulice Brilliant, M.S., OTR/L Pager: [place] 10/07/2017, 4:19 PM

## 2017-10-07 NOTE — Progress Notes (Signed)
Patient called me to room.  She thinks she should have her Right shoulder X-rayed. She requests I call MD.

## 2017-10-07 NOTE — Consult Note (Signed)
Physical Medicine and Rehabilitation Consult Reason for Consult: Creased functional mobility Referring Physician: Dr. Doylene Canard   HPI: [name] is a 71 y.o. right handed female with history of diabetes mellitus, COPD with tobacco abuse. Per chart review patient lives with spouse. Spouse works during the day but daughters can assist. One level home to steps to entry. Patient was independent through October with ADLs and driving. 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 Earlie Server. Patient had been scheduled for a VATS right upper lobectomy. Nodule with metastatic lesion in left cerebral hemisphere noted new onset of right upper extremity becoming weak. MRI 09/23/2017 the brain showed a 10 mm left posterior frontal mass/with moderate vasogenic edema. Underwent CT-guided core needle biopsy of right upper lobe nodule 09/24/2017. Intermittent bouts of altered mental status EEG negative for seizure. Oncology follow-up after final pathology reports on plan of care. Latest follow-up CT again shows superior left frontal lobe hemorrhagic metastasis with vasogenic edema 10/02/2017. Physical therapy evaluation completed. Patient with ongoing increased weakness of lower extremities limiting overall mobility and as of 10/06/2017 recommendations made for physical medicine rehabilitation consult.   Review of Systems  Constitutional: Negative for chills and fever.  HENT: Negative for hearing loss.   Eyes: Negative for blurred vision.  Respiratory: Positive for cough and shortness of breath.   Cardiovascular: Positive for palpitations.  Gastrointestinal: Positive for constipation. Negative for nausea and vomiting.       GERD  Musculoskeletal: Positive for joint pain and myalgias.  Skin: Negative for rash.  Neurological: Positive for dizziness and headaches. Negative for seizures.  Psychiatric/Behavioral:         Anxiety  All other systems reviewed and are negative.  Past Medical History:  Diagnosis Date  . Anxiety   . Arthritis   . Cancer ([place])   . Chronic back pain greater than 3 months duration   . Chronic neck pain   . COPD (chronic obstructive pulmonary disease) ([place])   . Cough   . Diabetes mellitus   . Dyspnea   . GERD (gastroesophageal reflux disease)   . H/O hiatal hernia   . Headache(784.0)   . Hemorrhoids   . Herniated disc   . History of kidney stones   . Hx of echocardiogram 2010   normal EF  . Hypertension   . Palpitations   . Shortness of breath on exertion 11/10/11   "sometimes"  . Vertigo   . Vertigo    Past Surgical History:  Procedure Laterality Date  . ABDOMINAL HYSTERECTOMY  1997?   "partial"  . ANTERIOR CERVICAL DECOMP/DISCECTOMY FUSION N/A 12/25/2016   Procedure: ANTERIOR CERVICAL DECOMPRESSION/DISCECTOMY FUSION CERVICAL FOUR CERVICAL FIVE;  Surgeon: Ashok Pall, MD;  Location: [facility];  Service: Neurosurgery;  Laterality: N/A;  . CHOLECYSTECTOMY  ~ 2008  . COLONOSCOPY    . EYE SURGERY     cateracts  . FRACTURE SURGERY  2004?   right shoulder  . KNEE ARTHROSCOPY  2004   right  . KNEE SURGERY     right  . neck fusion    . SHOULDER OPEN ROTATOR CUFF REPAIR  07/20/2011   left  . TONSILLECTOMY     Family History  Problem Relation Age of Onset  . Lung cancer Sister    Social History:  reports that she quit smoking about 2 months ago. Her smoking use included cigarettes. She has a 7.50 pack-year  smoking history. she has never used smokeless tobacco. She reports that she does not drink alcohol or use drugs. Allergies:  Allergies  Allergen Reactions  . Oxycodone Hives and Itching   Medications Prior to Admission  Medication Sig Dispense Refill  . albuterol (PROVENTIL) (2.5 MG/3ML) 0.083% nebulizer solution USE 1 VIAL EVERY 6 HOURS AS NEEDED FOR SHORTNESS OF BREATH  0  . albuterol (VENTOLIN HFA) 108 (90 Base) MCG/ACT inhaler Inhale 2 puffs into  the lungs every 6 (six) hours as needed for wheezing or shortness of breath.    . ALPRAZolam (XANAX) 0.25 MG tablet Take 0.25 mg by mouth daily as needed for anxiety.   2  . amLODipine (NORVASC) 5 MG tablet Take 5 mg by mouth 2 (two) times daily.     . cholecalciferol (VITAMIN D) 1000 units tablet Take 1,000 Units by mouth daily.    . cloNIDine (CATAPRES) 0.1 MG tablet Take 0.1 mg by mouth 2 (two) times daily as needed (high blood pressure).     . conjugated estrogens (PREMARIN) vaginal cream Place 0.5 Applicatorfuls vaginally at bedtime as needed.    . cyclobenzaprine (FLEXERIL) 10 MG tablet Take 10 mg by mouth at bedtime as needed for muscle spasms.    Marland Kitchen dexamethasone (DECADRON) 4 MG tablet Take 4 mg 3 (three) times daily by mouth.    . esomeprazole (NEXIUM) 40 MG capsule Take 40 mg daily as needed by mouth (acid reflux).   2  . gabapentin (NEURONTIN) 100 MG capsule Take 100 mg by mouth daily as needed (for leg pain).   1  . hydrALAZINE (APRESOLINE) 50 MG tablet Take 50 mg 2 (two) times daily by mouth.     . INCRUSE ELLIPTA 62.5 MCG/INH AEPB TAKE 1 PUFF BY MOUTH EVERY DAY AS NEEDED FOR SOB  5  . JANUMET 50-500 MG tablet Take 1 tablet by mouth daily.  6  . KLOR-CON M10 10 MEQ tablet Take 1 tablet (10 mEq total) by mouth daily. (Patient taking differently: Take 10 mEq by mouth 2 (two) times daily. )    . levETIRAcetam (KEPPRA) 500 MG tablet Take 1 tablet (500 mg total) by mouth 2 (two) times daily. 60 tablet 0  . loratadine (CLARITIN) 10 MG tablet Take 10 mg by mouth daily as needed for allergies.    Marland Kitchen meclizine (ANTIVERT) 25 MG tablet Take 25 mg daily as needed by mouth for dizziness or nausea.   1  . metoprolol succinate (TOPROL-XL) 25 MG 24 hr tablet Take 25 mg by mouth daily.  1  . ondansetron (ZOFRAN ODT) 4 MG disintegrating tablet Take 1 tablet (4 mg total) by mouth every 8 (eight) hours as needed for nausea or vomiting. (Patient taking differently: Take 4 mg by mouth every 8 (eight) hours as  needed for nausea or vomiting. DISSOLVE IN THE MOUTH) 20 tablet 0  . rosuvastatin (CRESTOR) 10 MG tablet Take 10 mg by mouth daily.      Home: Home Living Family/patient expects to be discharged to:: Private residence Living Arrangements: Spouse/significant other Available Help at Discharge: Family, Friend(s), Available 24 hours/day(Husband works as a Administrator, but friends available) Type of Home: House Home Access: Stairs to enter Technical brewer of Steps: 2 [place]: One level Bathroom Shower/Tub: Public librarian, Architectural technologist: Standard Home Equipment: Environmental consultant - 2 wheels  Functional History: Prior Function Level of Independence: Independent Comments: ADLs, IADLs, and driving Functional Status:  Mobility: Bed Mobility Overal bed mobility: Needs Assistance Bed Mobility: Supine  to Sit Supine to sit: Mod assist, HOB elevated Sit to supine: Supervision General bed mobility comments: Pt required modA for LE managment off of bed, trunk to upright and pad scoot of hips to EoB Transfers Overall transfer level: Needs assistance Equipment used: Rolling walker (2 wheeled) Transfers: Sit to/from Stand, Stand Pivot Transfers Sit to Stand: Max assist General transfer comment: max A for power up to standing, once in standing able to steady, vc for anterior pelvic rotation to attain upright, pt unable to perform hip, knee flexion to be able to lift R LE off of floor, pt sat back down and recliner readjusted to perform stand pivot transfer again required maxA to come to upright and maximal verbal and tactile cuing to pivot to the recliner, pt not able to weightshift to L to offweight R side Ambulation/Gait Ambulation/Gait assistance: Min assist Ambulation Distance (Feet): 60 Feet Assistive device: Rolling walker (2 wheeled) Gait Pattern/deviations: Decreased dorsiflexion - right, Step-through pattern General Gait Details: unable to attempt due to increased  weakness Gait velocity: slow and guarded Gait velocity interpretation: Below normal speed for age/gender    ADL: ADL Overall ADL's : Needs assistance/impaired Eating/Feeding: Set up, Sitting Grooming: Wash/dry face, Oral care, Wash/dry hands, Min guard, Standing Upper Body Bathing: Min guard, Sitting Lower Body Bathing: Minimal assistance, Sit to/from stand Upper Body Dressing : Minimal assistance, Sitting Upper Body Dressing Details (indicate cue type and reason): Pt requiring Min A to don gown like a jacket while seated Lower Body Dressing: Minimal assistance, Sit to/from stand Lower Body Dressing Details (indicate cue type and reason): Pt demonstrating decreased funcitonal performance for donning socks. Pt with decreased grasp strength and sock slipping out of pt's R hand throughout task.  Toilet Transfer: Minimal assistance, Ambulation, Regular Toilet, Grab bars Toilet Transfer Details (indicate cue type and reason): Min A for safe descent. Pt with posterior lean when sitting Toileting- Clothing Manipulation and Hygiene: Set up, Sitting/lateral lean Tub/ Shower Transfer: Moderate assistance, Ambulation, Tub transfer, 3 in 1, Rolling walker, Cueing for sequencing, Cueing for safety Tub/Shower Transfer Details (indicate cue type and reason): Pt requiring Max VCs and Mod physical A for tub transafer. Pt dmeosntrating poor sequencing and motor planning.  Functional mobility during ADLs: Rolling walker, Cueing for sequencing, Cueing for safety, Minimal assistance(Difficulty placing RUE onto RW) General ADL Comments: Pt continues to present with decreased process and motor planning. Pt requiring Max cues for sequencing tub transfer and Min-Mod physical A for safety. Pt reuqiring increased time and effort for donning socks and present with poor grasp stength and sock slipping out of pt's grasp.   Cognition: Cognition Overall Cognitive Status: Impaired/Different from baseline Orientation  Level: Oriented X4 Cognition Arousal/Alertness: Awake/alert Behavior During Therapy: WFL for tasks assessed/performed Overall Cognitive Status: Impaired/Different from baseline Area of Impairment: Problem solving Awareness: Intellectual(R sided intattention) Problem Solving: Slow processing, Difficulty sequencing, Requires verbal cues, Requires tactile cues General Comments: pt with difficulty processing multi-step tasks  Blood pressure (!) 128/55, pulse 83, temperature (!) 101.9 F (38.8 C), temperature source Oral, resp. rate 18, height 5\' 7"  (1.702 m), weight 105.8 kg (233 lb 4 oz), SpO2 93 %. Physical Exam  HENT:  Head: Normocephalic.  Eyes:  Pupils reactive to light  Neck: Normal range of motion. Neck supple. No thyromegaly present.  Cardiovascular: Normal rate, regular rhythm and normal heart sounds.  Respiratory: Effort normal and breath sounds normal. No respiratory distress.  GI: Soft. Bowel sounds are normal. She exhibits no distension.  Neurological:  She is alert.  Makes good eye contact with examiner. Followed simple commands. She provides name agent date of birth.  Right upper extremity is 1 5 deltoid bicep and tricep, wrist and hand intrinsics are 2+ out of 5.  Right lower extremity is 1+ out of 5 hip flexor knee extension and 2+ out of 5 ankle dorsiflexion and plantar flexion.  Left upper extremity and lower extremity are 5 out of 5.  Patient with intact sensation to light touch and pain in the right arm and leg.  Mild right facial droop.  Cognitively she is appropriate  Skin: Skin is warm and dry.  Psychiatric: She has a normal mood and affect. Her behavior is normal.    Results for orders placed or performed during the hospital encounter of 10/02/17 (from the past 24 hour(s))  Glucose, capillary     Status: Abnormal   Collection Time: 10/06/17 11:34 AM  Result Value Ref Range   Glucose-Capillary 218 (H) 65 - 99 mg/dL  Glucose, capillary     Status: Abnormal    Collection Time: 10/06/17  5:17 PM  Result Value Ref Range   Glucose-Capillary 128 (H) 65 - 99 mg/dL  Glucose, capillary     Status: Abnormal   Collection Time: 10/06/17  9:09 PM  Result Value Ref Range   Glucose-Capillary 157 (H) 65 - 99 mg/dL   Comment 1 Notify RN    Comment 2 Document in Chart   CBC     Status: None   Collection Time: 10/07/17  3:41 AM  Result Value Ref Range   WBC 6.5 4.0 - 10.5 K/uL   RBC 4.51 3.87 - 5.11 MIL/uL   Hemoglobin 13.2 12.0 - 15.0 g/dL   HCT 40.1 36.0 - 46.0 %   MCV 88.9 78.0 - 100.0 fL   MCH 29.3 26.0 - 34.0 pg   MCHC 32.9 30.0 - 36.0 g/dL   RDW 13.3 11.5 - 15.5 %   Platelets 218 150 - 400 K/uL  Basic metabolic panel     Status: Abnormal   Collection Time: 10/07/17  3:41 AM  Result Value Ref Range   Sodium 134 (L) 135 - 145 mmol/L   Potassium 4.0 3.5 - 5.1 mmol/L   Chloride 102 101 - 111 mmol/L   CO2 25 22 - 32 mmol/L   Glucose, Bld 111 (H) 65 - 99 mg/dL   BUN 14 6 - 20 mg/dL   Creatinine, Ser 1.05 (H) 0.44 - 1.00 mg/dL   Calcium 8.6 (L) 8.9 - 10.3 mg/dL   GFR calc non Af Amer 53 (L) >60 mL/min   GFR calc Af Amer >60 >60 mL/min   Anion gap 7 5 - 15  Glucose, capillary     Status: Abnormal   Collection Time: 10/07/17  6:45 AM  Result Value Ref Range   Glucose-Capillary 109 (H) 65 - 99 mg/dL   Comment 1 Notify RN    Comment 2 Document in Chart    No results found.  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 Does the need for close, 24 hr/day medical supervision in concert with the patient's rehab needs make it unreasonable for this patient to be served in a less intensive setting? Yes 2. Co-Morbidities requiring supervision/potential complications: Oncological considerations, COPD, diabetes type 2, hypertension 3. Due to bladder management, bowel management, safety, skin/wound care, disease management, medication administration, pain management and patient education, does the patient  require 24 hr/day rehab nursing? Yes 4.  Does the patient require coordinated care of a physician, rehab nurse, PT (1-2 hrs/day, 5 days/week) and OT (1-2 hrs/day, 5 days/week), potentially SLP to address physical and functional deficits in the context of the above medical diagnosis(es)? Yes Addressing deficits in the following areas: balance, endurance, locomotion, strength, transferring, bowel/bladder control, bathing, dressing, feeding, grooming, toileting and psychosocial support 5. Can the patient actively participate in an intensive therapy program of at least 3 hrs of therapy per day at least 5 days per week? Yes 6. The potential for patient to make measurable gains while on inpatient rehab is excellent 7. Anticipated functional outcomes upon discharge from inpatient rehab are supervision and min assist  with PT, supervision and min assist with OT, n/a with SLP. 8. Estimated rehab length of stay to reach the above functional goals is: 15-20 days 9. Anticipated D/C setting: Home 10. Anticipated post D/C treatments: [place] therapy 11. Overall Rehab/Functional Prognosis: excellent  RECOMMENDATIONS: This patient's condition is appropriate for continued rehabilitative care in the following setting: CIR Patient has agreed to participate in recommended program. Yes Note that insurance prior authorization [place] be required for reimbursement for recommended care.  Comment: Rehab Admissions Coordinator to follow up.  Thanks,  Meredith Staggers, MD, Mellody Drown    Cathlyn Parsons., PA-C 10/07/2017

## 2017-10-07 NOTE — Progress Notes (Signed)
Pt was not able to lift the RLE muscle strength was 2- /5  and very difficult to get [facility]ob to [facility]. Marland KitchenHowever this am RLE strength has return 3+/5 now.

## 2017-10-08 LAB — GLUCOSE, CAPILLARY
GLUCOSE-CAPILLARY: 193 mg/dL — AB (ref 65–99)
Glucose-Capillary: 148 mg/dL — ABNORMAL HIGH (ref 65–99)
Glucose-Capillary: 126 mg/dL — ABNORMAL HIGH (ref 65–99)
Glucose-Capillary: 244 mg/dL — ABNORMAL HIGH (ref 65–99)

## 2017-10-08 MED ORDER — DEXAMETHASONE 4 MG PO TABS
4.0000 mg | ORAL_TABLET | Freq: Two times a day (BID) | ORAL | Status: DC
  Administered 2017-10-08 – 2017-10-10 (×6): 4 mg via ORAL
  Filled 2017-10-08 (×7): qty 1

## 2017-10-08 MED ORDER — MECLIZINE HCL 12.5 MG PO TABS: 25.0000 mg | ORAL_TABLET | Freq: Every day | ORAL | Status: DC | PRN

## 2017-10-08 MED ORDER — ACETAMINOPHEN 500 MG PO TABS
500.0000 mg | ORAL_TABLET | Freq: Four times a day (QID) | ORAL | Status: DC | PRN
  Administered 2017-10-08: 500 mg via ORAL

## 2017-10-08 NOTE — Progress Notes (Signed)
Inpatient Rehabilitation  Met with patient and spouse at bedside to discuss team's recommendation for IP Rehab.  They are in support of her participating in this program in order to maximize her functional independence.  I have initiated insurance authorization and plan to follow up with team on Monday with a hopeful decision.  Call if questions.    Carmelia Roller., CCC/SLP Admission Coordinator  [facility]  Cell [phone]

## 2017-10-08 NOTE — Progress Notes (Signed)
  Radiation Oncology         [phone]) [phone] ________________________________  Name: [name] MRN: [id number]  Date: 09/30/2017  DOB: 1946/04/30  End of Treatment Note  Diagnosis:   71 y.o. 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  Narrative: The patient tolerated radiation treatment well.   There were no signs of acute toxicity after treatment.  Plan: The patient has completed radiation treatment. The patient will return to radiation oncology clinic for routine followup in one month. I advised the patient to call or return sooner if they have any questions or concerns related to their recovery or treatment. ________________________________  ------------------------------------------------  Jodelle Gross, MD, PhD  This document serves as a record of services personally performed by Kyung Rudd, MD. It was created on his behalf by Rae Lips, a trained medical scribe. The creation of this record is based on the scribe's personal observations and the provider's statements to them. This document has been checked and approved by the attending provider.

## 2017-10-08 NOTE — Care Management Note (Signed)
Case Management Note  Patient Details  Name: [name] MRN: [id number] Date of Birth: [birth year]  Subjective/Objective:                    Action/Plan: Plan is CIR when patient is medically ready. CM following.  Expected Discharge Date:                  Expected Discharge Plan:     In-House Referral:     Discharge planning Services     Post Acute Care Choice:    Choice offered to:     DME Arranged:    DME Agency:     HH Arranged:    HH Agency:     Status of Service:     If discussed at [employer], dates discussed:    Additional Comments:  Pollie Friar, RN 10/08/2017, 4:14 PM

## 2017-10-08 NOTE — Progress Notes (Signed)
Ref: Kirsten Dials, MD   Subjective:  Right leg and upper arm weakness remains significant. X-ray right shoulder is negative for acute fracture. T max down to 100.1 degree F.  Objective:  Vital Signs in the last 24 hours: Temp:  [99 F (37.2 C)-100.1 F (37.8 C)] 99.8 F (37.7 C) (11/16 0118) Pulse Rate:  [80-86] 86 (11/16 0118) Cardiac Rhythm: Normal sinus rhythm (11/16 0700) Resp:  [18-20] 18 (11/16 0118) BP: (115-121)/(53-62) 118/58 (11/16 0800) SpO2:  [91 %-96 %] 92 % (11/16 0931) Weight:  [106.5 kg (234 lb 12.6 oz)] 106.5 kg (234 lb 12.6 oz) (11/16 0118)  Physical Exam: BP Readings from Last 1 Encounters:  10/08/17 (!) 118/58     Wt Readings from Last 1 Encounters:  10/08/17 106.5 kg (234 lb 12.6 oz)    Weight change: 0.7 kg (1 lb 8.7 oz) Body mass index is 36.77 kg/m. HEENT: [place]/AT, Eyes-Brown, PERL, EOMI, Conjunctiva-Pink, Sclera-Non-icteric Neck: No JVD, No bruit, Trachea midline. Lungs:  Clear, Bilateral. Cardiac:  Regular rhythm, normal S1 and S2, no S3. II/VI systolic murmur. Abdomen:  Soft, non-tender. BS present. Extremities:  No edema present. No cyanosis. No clubbing. CNS: AxOx3, Cranial nerves grossly intact, moves all 4 extremities. RLE more than RUE weakness. Skin: Warm and dry.   Intake/Output from previous day: 11/15 0701 - 11/16 0700 In: 460 [P.O.:360; I.V.:100] Out: 50 [Urine:50]    Lab Results: BMET    Component Value Date/Time   NA 134 (L) 10/07/2017 0341   NA 132 (L) 10/05/2017 0415   NA 131 (L) 10/04/2017 1004   NA 131 (L) 09/28/2017 1355   K 4.0 10/07/2017 0341   K 4.1 10/05/2017 0415   K 3.9 10/04/2017 1004   K 4.5 09/28/2017 1355   CL 102 10/07/2017 0341   CL 98 (L) 10/05/2017 0415   CL 96 (L) 10/04/2017 1004   CO2 25 10/07/2017 0341   CO2 26 10/05/2017 0415   CO2 27 10/04/2017 1004   CO2 23 09/28/2017 1355   GLUCOSE 111 (H) 10/07/2017 0341   GLUCOSE 116 (H) 10/05/2017 0415   GLUCOSE 209 (H) 10/04/2017 1004   GLUCOSE 248  (H) 09/28/2017 1355   BUN 14 10/07/2017 0341   BUN 20 10/05/2017 0415   BUN 19 10/04/2017 1004   BUN 25.7 09/28/2017 1355   CREATININE 1.05 (H) 10/07/2017 0341   CREATININE 1.26 (H) 10/05/2017 0415   CREATININE 1.20 (H) 10/04/2017 1004   CREATININE 1.2 (H) 09/28/2017 1355   CALCIUM 8.6 (L) 10/07/2017 0341   CALCIUM 8.5 (L) 10/05/2017 0415   CALCIUM 8.8 (L) 10/04/2017 1004   CALCIUM 9.1 09/28/2017 1355   GFRNONAA 53 (L) 10/07/2017 0341   GFRNONAA 42 (L) 10/05/2017 0415   GFRNONAA 45 (L) 10/04/2017 1004   GFRAA >60 10/07/2017 0341   GFRAA 49 (L) 10/05/2017 0415   GFRAA 52 (L) 10/04/2017 1004   CBC    Component Value Date/Time   WBC 6.5 10/07/2017 0341   RBC 4.51 10/07/2017 0341   HGB 13.2 10/07/2017 0341   HGB 13.4 09/28/2017 1356   HCT 40.1 10/07/2017 0341   HCT 40.3 09/28/2017 1356   PLT 218 10/07/2017 0341   PLT 343 09/28/2017 1356   MCV 88.9 10/07/2017 0341   MCV 87.9 09/28/2017 1356   MCH 29.3 10/07/2017 0341   MCHC 32.9 10/07/2017 0341   RDW 13.3 10/07/2017 0341   RDW 13.7 09/28/2017 1356   LYMPHSABS 3.5 10/02/2017 0634   LYMPHSABS 0.9 09/28/2017 1356  MONOABS 0.8 10/02/2017 0634   MONOABS 0.5 09/28/2017 1356   EOSABS 0.2 10/02/2017 0634   EOSABS 0.0 09/28/2017 1356   BASOSABS 0.0 10/02/2017 0634   BASOSABS 0.1 09/28/2017 1356   HEPATIC Function Panel Recent Labs    09/28/17 1355 10/02/17 0634 10/05/17 0415  PROT 6.5 6.8 5.5*   HEMOGLOBIN A1C No components found for: HGA1C,  MPG CARDIAC ENZYMES Lab Results  Component Value Date   CKTOTAL 95 01/01/2009   CKMB 0.8 01/01/2009   TROPONINI <0.01        NO INDICATION OF MYOCARDIAL INJURY. 01/01/2009   TROPONINI <0.01        NO INDICATION OF MYOCARDIAL INJURY. 01/01/2009   TROPONINI <0.01        NO INDICATION OF MYOCARDIAL INJURY. 12/31/2008   BNP No results for input(s): PROBNP in the last 8760 hours. TSH No results for input(s): TSH in the last 8760 hours. CHOLESTEROL No results for  input(s): CHOL in the last 8760 hours.  Scheduled Meds: . amLODipine  5 mg Oral BID  . dexamethasone  4 mg Oral Q12H  . hydrALAZINE  25 mg Oral BID  . insulin aspart  0-20 Units Subcutaneous TID WC  . insulin aspart  4 Units Subcutaneous TID WC  . insulin glargine  25 Units Subcutaneous QHS  . levETIRAcetam  750 mg Oral BID  . metFORMIN  500 mg Oral Q breakfast   And  . linagliptin  5 mg Oral Daily  . losartan  25 mg Oral Daily  . metoprolol succinate  25 mg Oral Daily  . pantoprazole  40 mg Oral Daily  . potassium chloride  10 mEq Oral Daily  . rosuvastatin  10 mg Oral Daily  . umeclidinium bromide  1 puff Inhalation Daily   Continuous Infusions: . sodium chloride 10 mL/hr at 10/08/17 0600  . cefTRIAXone (ROCEPHIN)  IV Stopped (10/07/17 1705)   PRN Meds:.albuterol, ALPRAZolam, gabapentin, HYDROcodone-acetaminophen, loratadine, meclizine, ondansetron, polyethylene glycol  Assessment/Plan: Right sided hemiparesis (with significant right sided weakness) New onset seizure disorder Left frontal lobe hemorrhagic metastatic lesion Recent squamous cell lung CA with mets Hypertension Type 2 DM Obesity History of tobacco use disorder  Add decadron to decrease peri-infarct swelling   LOS: 6 days    Kirsten Dials  MD  10/08/2017, 9:45 AM

## 2017-10-09 LAB — GLUCOSE, CAPILLARY
GLUCOSE-CAPILLARY: 256 mg/dL — AB (ref 65–99)
GLUCOSE-CAPILLARY: 398 mg/dL — AB (ref 65–99)
GLUCOSE-CAPILLARY: 236 mg/dL — AB (ref 65–99)
GLUCOSE-CAPILLARY: 256 mg/dL — AB (ref 65–99)

## 2017-10-09 NOTE — Progress Notes (Signed)
Ref: Kirsten Dials, MD   Subjective:  Awake and alert. Able to lift right upper and lower extremities against gravity, a significant change compared to yesterday.  Objective:  Vital Signs in the last 24 hours: Temp:  [97.5 F (36.4 C)-100.7 F (38.2 C)] 97.8 F (36.6 C) (11/17 0444) Pulse Rate:  [75-88] 75 (11/17 0444) Cardiac Rhythm: Normal sinus rhythm (11/17 0821) Resp:  [19-22] 20 (11/17 0444) BP: (125-137)/(60-72) 135/60 (11/17 0444) SpO2:  [94 %-100 %] 96 % (11/17 0900) Weight:  [106.8 kg (235 lb 8 oz)] 106.8 kg (235 lb 8 oz) (11/17 0444)  Physical Exam: BP Readings from Last 1 Encounters:  10/09/17 135/60     Wt Readings from Last 1 Encounters:  10/09/17 106.8 kg (235 lb 8 oz)    Weight change: 0.322 kg (11.4 oz) Body mass index is 36.88 kg/m. HEENT: [place]/AT, Eyes-Brown, PERL, EOMI, Conjunctiva-Pink, Sclera-Non-icteric Neck: No JVD, No bruit, Trachea midline. Lungs:  Clear, Bilateral. Cardiac:  Regular rhythm, normal S1 and S2, no S3. II/VI systolic murmur. Abdomen:  Soft, non-tender. BS present. Extremities:  No edema present. No cyanosis. No clubbing. CNS: AxOx3, Cranial nerves grossly intact, moves all 4 extremities. Right sided weakness apparently improving. Skin: Warm and dry.   Intake/Output from previous day: 11/16 0701 - 11/17 0700 In: 904.2 [P.O.:720; I.V.:84.2; IV Piggyback:100] Out: -     Lab Results: BMET    Component Value Date/Time   NA 134 (L) 10/07/2017 0341   NA 132 (L) 10/05/2017 0415   NA 131 (L) 10/04/2017 1004   NA 131 (L) 09/28/2017 1355   K 4.0 10/07/2017 0341   K 4.1 10/05/2017 0415   K 3.9 10/04/2017 1004   K 4.5 09/28/2017 1355   CL 102 10/07/2017 0341   CL 98 (L) 10/05/2017 0415   CL 96 (L) 10/04/2017 1004   CO2 25 10/07/2017 0341   CO2 26 10/05/2017 0415   CO2 27 10/04/2017 1004   CO2 23 09/28/2017 1355   GLUCOSE 111 (H) 10/07/2017 0341   GLUCOSE 116 (H) 10/05/2017 0415   GLUCOSE 209 (H) 10/04/2017 1004   GLUCOSE 248  (H) 09/28/2017 1355   BUN 14 10/07/2017 0341   BUN 20 10/05/2017 0415   BUN 19 10/04/2017 1004   BUN 25.7 09/28/2017 1355   CREATININE 1.05 (H) 10/07/2017 0341   CREATININE 1.26 (H) 10/05/2017 0415   CREATININE 1.20 (H) 10/04/2017 1004   CREATININE 1.2 (H) 09/28/2017 1355   CALCIUM 8.6 (L) 10/07/2017 0341   CALCIUM 8.5 (L) 10/05/2017 0415   CALCIUM 8.8 (L) 10/04/2017 1004   CALCIUM 9.1 09/28/2017 1355   GFRNONAA 53 (L) 10/07/2017 0341   GFRNONAA 42 (L) 10/05/2017 0415   GFRNONAA 45 (L) 10/04/2017 1004   GFRAA >60 10/07/2017 0341   GFRAA 49 (L) 10/05/2017 0415   GFRAA 52 (L) 10/04/2017 1004   CBC    Component Value Date/Time   WBC 6.5 10/07/2017 0341   RBC 4.51 10/07/2017 0341   HGB 13.2 10/07/2017 0341   HGB 13.4 09/28/2017 1356   HCT 40.1 10/07/2017 0341   HCT 40.3 09/28/2017 1356   PLT 218 10/07/2017 0341   PLT 343 09/28/2017 1356   MCV 88.9 10/07/2017 0341   MCV 87.9 09/28/2017 1356   MCH 29.3 10/07/2017 0341   MCHC 32.9 10/07/2017 0341   RDW 13.3 10/07/2017 0341   RDW 13.7 09/28/2017 1356   LYMPHSABS 3.5 10/02/2017 0634   LYMPHSABS 0.9 09/28/2017 1356   MONOABS 0.8 10/02/2017 1610  MONOABS 0.5 09/28/2017 1356   EOSABS 0.2 10/02/2017 0634   EOSABS 0.0 09/28/2017 1356   BASOSABS 0.0 10/02/2017 0634   BASOSABS 0.1 09/28/2017 1356   HEPATIC Function Panel Recent Labs    09/28/17 1355 10/02/17 0634 10/05/17 0415  PROT 6.5 6.8 5.5*   HEMOGLOBIN A1C No components found for: HGA1C,  MPG CARDIAC ENZYMES Lab Results  Component Value Date   CKTOTAL 95 01/01/2009   CKMB 0.8 01/01/2009   TROPONINI <0.01        NO INDICATION OF MYOCARDIAL INJURY. 01/01/2009   TROPONINI <0.01        NO INDICATION OF MYOCARDIAL INJURY. 01/01/2009   TROPONINI <0.01        NO INDICATION OF MYOCARDIAL INJURY. 12/31/2008   BNP No results for input(s): PROBNP in the last 8760 hours. TSH No results for input(s): TSH in the last 8760 hours. CHOLESTEROL No results for  input(s): CHOL in the last 8760 hours.  Scheduled Meds: . amLODipine  5 mg Oral BID  . dexamethasone  4 mg Oral Q12H  . hydrALAZINE  25 mg Oral BID  . insulin aspart  0-20 Units Subcutaneous TID WC  . insulin aspart  4 Units Subcutaneous TID WC  . insulin glargine  25 Units Subcutaneous QHS  . levETIRAcetam  750 mg Oral BID  . metFORMIN  500 mg Oral Q breakfast   And  . linagliptin  5 mg Oral Daily  . losartan  25 mg Oral Daily  . metoprolol succinate  25 mg Oral Daily  . pantoprazole  40 mg Oral Daily  . potassium chloride  10 mEq Oral Daily  . rosuvastatin  10 mg Oral Daily  . umeclidinium bromide  1 puff Inhalation Daily   Continuous Infusions: . sodium chloride Stopped (10/08/17 1425)  . 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   Increase activity as tolerated. Awaiting: Inpatient rehab.   LOS: 7 days    Kirsten Dials  MD  10/09/2017, 10:24 AM

## 2017-10-10 LAB — GLUCOSE, CAPILLARY
GLUCOSE-CAPILLARY: 336 mg/dL — AB (ref 65–99)
GLUCOSE-CAPILLARY: 232 mg/dL — AB (ref 65–99)
GLUCOSE-CAPILLARY: 223 mg/dL — AB (ref 65–99)
Glucose-Capillary: 294 mg/dL — ABNORMAL HIGH (ref 65–99)

## 2017-10-10 MED ORDER — MECLIZINE HCL 12.5 MG PO TABS
25.0000 mg | ORAL_TABLET | Freq: Three times a day (TID) | ORAL | Status: DC
  Administered 2017-10-10 – 2017-10-11 (×4): 25 mg via ORAL
  Filled 2017-10-10 (×4): qty 2

## 2017-10-10 MED ORDER — LEVETIRACETAM 500 MG PO TABS
500.0000 mg | ORAL_TABLET | Freq: Two times a day (BID) | ORAL | Status: DC
  Administered 2017-10-10 – 2017-10-11 (×2): 500 mg via ORAL
  Filled 2017-10-10 (×2): qty 1

## 2017-10-10 MED ORDER — INSULIN GLARGINE 100 UNIT/ML ~~LOC~~ SOLN
40.0000 [IU] | Freq: Every day | SUBCUTANEOUS | Status: DC
  Administered 2017-10-10: 40 [IU] via SUBCUTANEOUS
  Filled 2017-10-10 (×2): qty 0.4

## 2017-10-10 MED ORDER — SODIUM CHLORIDE 0.9 % IV BOLUS (SEPSIS)
500.0000 mL | Freq: Once | INTRAVENOUS | Status: AC
  Administered 2017-10-10: 500 mL via INTRAVENOUS

## 2017-10-10 MED ORDER — INSULIN GLARGINE 100 UNIT/ML ~~LOC~~ SOLN
15.0000 [IU] | Freq: Once | SUBCUTANEOUS | Status: AC
  Administered 2017-10-10: 15 [IU] via SUBCUTANEOUS
  Filled 2017-10-10: qty 0.15

## 2017-10-10 NOTE — Progress Notes (Signed)
Ref: Dixie Dials, MD   Subjective:  C/O dizziness. Small further improvement in right upper and lower extremities motion. Low blood pressure.  Objective:  Vital Signs in the last 24 hours: Temp:  [97.7 F (36.5 C)-98.1 F (36.7 C)] 98.1 F (36.7 C) (11/18 0951) Pulse Rate:  [69-88] 70 (11/18 0951) Cardiac Rhythm: Normal sinus rhythm (11/18 0700) Resp:  [17-18] 18 (11/18 0951) BP: (113-136)/(42-65) 118/43 (11/18 0951) SpO2:  [95 %-98 %] 98 % (11/18 0951) Weight:  [105.2 kg (231 lb 14.8 oz)] 105.2 kg (231 lb 14.8 oz) (11/18 0606)  Physical Exam: BP Readings from Last 1 Encounters:  10/10/17 (!) 118/43     Wt Readings from Last 1 Encounters:  10/10/17 105.2 kg (231 lb 14.8 oz)    Weight change: -1.622 kg (-9.2 oz) Body mass index is 36.32 kg/m. HEENT: [place]/AT, Eyes-Brown, PERL, EOMI, Conjunctiva-Pink, Sclera-Non-icteric Neck: No JVD, No bruit, Trachea midline. Lungs:  Clear, Bilateral. Cardiac:  Regular rhythm, normal S1 and S2, no S3. II/VI systolic murmur. Abdomen:  Soft, non-tender. BS present. Extremities:  No edema present. No cyanosis. No clubbing. Right sided weakness. CNS: AxOx3, Cranial nerves grossly intact, moves all 4 extremities.  Skin: Warm and dry.   Intake/Output from previous day: 11/17 0701 - 11/18 0700 In: 480 [P.O.:480] Out: -     Lab Results: BMET    Component Value Date/Time   NA 134 (L) 10/07/2017 0341   NA 132 (L) 10/05/2017 0415   NA 131 (L) 10/04/2017 1004   NA 131 (L) 09/28/2017 1355   K 4.0 10/07/2017 0341   K 4.1 10/05/2017 0415   K 3.9 10/04/2017 1004   K 4.5 09/28/2017 1355   CL 102 10/07/2017 0341   CL 98 (L) 10/05/2017 0415   CL 96 (L) 10/04/2017 1004   CO2 25 10/07/2017 0341   CO2 26 10/05/2017 0415   CO2 27 10/04/2017 1004   CO2 23 09/28/2017 1355   GLUCOSE 111 (H) 10/07/2017 0341   GLUCOSE 116 (H) 10/05/2017 0415   GLUCOSE 209 (H) 10/04/2017 1004   GLUCOSE 248 (H) 09/28/2017 1355   BUN 14 10/07/2017 0341   BUN 20  10/05/2017 0415   BUN 19 10/04/2017 1004   BUN 25.7 09/28/2017 1355   CREATININE 1.05 (H) 10/07/2017 0341   CREATININE 1.26 (H) 10/05/2017 0415   CREATININE 1.20 (H) 10/04/2017 1004   CREATININE 1.2 (H) 09/28/2017 1355   CALCIUM 8.6 (L) 10/07/2017 0341   CALCIUM 8.5 (L) 10/05/2017 0415   CALCIUM 8.8 (L) 10/04/2017 1004   CALCIUM 9.1 09/28/2017 1355   GFRNONAA 53 (L) 10/07/2017 0341   GFRNONAA 42 (L) 10/05/2017 0415   GFRNONAA 45 (L) 10/04/2017 1004   GFRAA >60 10/07/2017 0341   GFRAA 49 (L) 10/05/2017 0415   GFRAA 52 (L) 10/04/2017 1004   CBC    Component Value Date/Time   WBC 6.5 10/07/2017 0341   RBC 4.51 10/07/2017 0341   HGB 13.2 10/07/2017 0341   HGB 13.4 09/28/2017 1356   HCT 40.1 10/07/2017 0341   HCT 40.3 09/28/2017 1356   PLT 218 10/07/2017 0341   PLT 343 09/28/2017 1356   MCV 88.9 10/07/2017 0341   MCV 87.9 09/28/2017 1356   MCH 29.3 10/07/2017 0341   MCHC 32.9 10/07/2017 0341   RDW 13.3 10/07/2017 0341   RDW 13.7 09/28/2017 1356   LYMPHSABS 3.5 10/02/2017 0634   LYMPHSABS 0.9 09/28/2017 1356   MONOABS 0.8 10/02/2017 0634   MONOABS 0.5 09/28/2017 1356  EOSABS 0.2 10/02/2017 0634   EOSABS 0.0 09/28/2017 1356   BASOSABS 0.0 10/02/2017 0634   BASOSABS 0.1 09/28/2017 1356   HEPATIC Function Panel Recent Labs    09/28/17 1355 10/02/17 0634 10/05/17 0415  PROT 6.5 6.8 5.5*   HEMOGLOBIN A1C No components found for: HGA1C,  MPG CARDIAC ENZYMES Lab Results  Component Value Date   CKTOTAL 95 01/01/2009   CKMB 0.8 01/01/2009   TROPONINI <0.01        NO INDICATION OF MYOCARDIAL INJURY. 01/01/2009   TROPONINI <0.01        NO INDICATION OF MYOCARDIAL INJURY. 01/01/2009   TROPONINI <0.01        NO INDICATION OF MYOCARDIAL INJURY. 12/31/2008   BNP No results for input(s): PROBNP in the last 8760 hours. TSH No results for input(s): TSH in the last 8760 hours. CHOLESTEROL No results for input(s): CHOL in the last 8760 hours.  Scheduled Meds: .  dexamethasone  4 mg Oral Q12H  . insulin aspart  0-20 Units Subcutaneous TID WC  . insulin aspart  4 Units Subcutaneous TID WC  . insulin glargine  15 Units Subcutaneous Once  . insulin glargine  40 Units Subcutaneous QHS  . levETIRAcetam  750 mg Oral BID  . metFORMIN  500 mg Oral Q breakfast   And  . linagliptin  5 mg Oral Daily  . losartan  25 mg Oral Daily  . meclizine  25 mg Oral TID  . metoprolol succinate  25 mg Oral Daily  . pantoprazole  40 mg Oral Daily  . potassium chloride  10 mEq Oral Daily  . rosuvastatin  10 mg Oral Daily  . umeclidinium bromide  1 puff Inhalation Daily   Continuous Infusions: . sodium chloride Stopped (10/08/17 1425)  . cefTRIAXone (ROCEPHIN)  IV Stopped (10/09/17 1621)  . sodium chloride     PRN Meds:.acetaminophen, albuterol, ALPRAZolam, gabapentin, HYDROcodone-acetaminophen, loratadine, ondansetron, polyethylene glycol  Assessment/Plan: Dizziness Positional vertigo Acute stroke with right sided hemiparesis New onset seizure disorder Left frontal lobe hemorrhagic metastatic lesion Recent squamous cell lung CA with metastasis Type 2 DM Obesity H/O tobacco use disorder, recently quit Possible adverse effect of leveteracitam  Continue meclizine. IV fluids. Discontinue amlodipine and hydralazine for now. Increase activity as tolerated.    LOS: 8 days    Dixie Dials  MD  10/10/2017, 10:32 AM

## 2017-10-11 ENCOUNTER — Encounter (HOSPITAL_COMMUNITY): Payer: Self-pay | Admitting: *Deleted

## 2017-10-11 ENCOUNTER — Inpatient Hospital Stay (HOSPITAL_COMMUNITY)
Admission: RE | Admit: 2017-10-11 | Discharge: 2017-10-23 | DRG: 056 | Disposition: A | Discharge status: Home Health | Payer: Medicare HMO | Source: Intra-hospital | Attending: Physical Medicine & Rehabilitation | Admitting: Physical Medicine & Rehabilitation

## 2017-10-11 DIAGNOSIS — K5900 Constipation, unspecified: Secondary | ICD-10-CM | POA: Diagnosis present

## 2017-10-11 DIAGNOSIS — Z720 Tobacco use: Secondary | ICD-10-CM

## 2017-10-11 DIAGNOSIS — Z981 Arthrodesis status: Secondary | ICD-10-CM

## 2017-10-11 DIAGNOSIS — C3490 Malignant neoplasm of unspecified part of unspecified bronchus or lung: Secondary | ICD-10-CM | POA: Diagnosis present

## 2017-10-11 DIAGNOSIS — K219 Gastro-esophageal reflux disease without esophagitis: Secondary | ICD-10-CM | POA: Diagnosis present

## 2017-10-11 DIAGNOSIS — Z90710 Acquired absence of both cervix and uterus: Secondary | ICD-10-CM | POA: Diagnosis not present

## 2017-10-11 DIAGNOSIS — Z794 Long term (current) use of insulin: Secondary | ICD-10-CM | POA: Diagnosis not present

## 2017-10-11 DIAGNOSIS — C3411 Malignant neoplasm of upper lobe, right bronchus or lung: Secondary | ICD-10-CM | POA: Diagnosis present

## 2017-10-11 DIAGNOSIS — Z79899 Other long term (current) drug therapy: Secondary | ICD-10-CM | POA: Diagnosis not present

## 2017-10-11 DIAGNOSIS — N183 Chronic kidney disease, stage 3 unspecified: Secondary | ICD-10-CM

## 2017-10-11 DIAGNOSIS — R05 Cough: Secondary | ICD-10-CM

## 2017-10-11 DIAGNOSIS — I10 Essential (primary) hypertension: Secondary | ICD-10-CM

## 2017-10-11 DIAGNOSIS — R7309 Other abnormal glucose: Secondary | ICD-10-CM

## 2017-10-11 DIAGNOSIS — E119 Type 2 diabetes mellitus without complications: Secondary | ICD-10-CM

## 2017-10-11 DIAGNOSIS — E785 Hyperlipidemia, unspecified: Secondary | ICD-10-CM | POA: Diagnosis present

## 2017-10-11 DIAGNOSIS — K5901 Slow transit constipation: Secondary | ICD-10-CM

## 2017-10-11 DIAGNOSIS — C3492 Malignant neoplasm of unspecified part of left bronchus or lung: Secondary | ICD-10-CM

## 2017-10-11 DIAGNOSIS — C7931 Secondary malignant neoplasm of brain: Secondary | ICD-10-CM | POA: Diagnosis present

## 2017-10-11 DIAGNOSIS — G936 Cerebral edema: Secondary | ICD-10-CM | POA: Diagnosis present

## 2017-10-11 DIAGNOSIS — Z885 Allergy status to narcotic agent status: Secondary | ICD-10-CM | POA: Diagnosis not present

## 2017-10-11 DIAGNOSIS — C3481 Malignant neoplasm of overlapping sites of right bronchus and lung: Secondary | ICD-10-CM | POA: Diagnosis present

## 2017-10-11 DIAGNOSIS — J449 Chronic obstructive pulmonary disease, unspecified: Secondary | ICD-10-CM | POA: Diagnosis present

## 2017-10-11 DIAGNOSIS — D72829 Elevated white blood cell count, unspecified: Secondary | ICD-10-CM

## 2017-10-11 DIAGNOSIS — E669 Obesity, unspecified: Secondary | ICD-10-CM | POA: Diagnosis not present

## 2017-10-11 DIAGNOSIS — G8191 Hemiplegia, unspecified affecting right dominant side: Secondary | ICD-10-CM | POA: Diagnosis present

## 2017-10-11 DIAGNOSIS — Z801 Family history of malignant neoplasm of trachea, bronchus and lung: Secondary | ICD-10-CM

## 2017-10-11 DIAGNOSIS — E1142 Type 2 diabetes mellitus with diabetic polyneuropathy: Secondary | ICD-10-CM | POA: Diagnosis present

## 2017-10-11 DIAGNOSIS — Z87891 Personal history of nicotine dependence: Secondary | ICD-10-CM

## 2017-10-11 DIAGNOSIS — I129 Hypertensive chronic kidney disease with stage 1 through stage 4 chronic kidney disease, or unspecified chronic kidney disease: Secondary | ICD-10-CM | POA: Diagnosis present

## 2017-10-11 DIAGNOSIS — E871 Hypo-osmolality and hyponatremia: Secondary | ICD-10-CM | POA: Diagnosis present

## 2017-10-11 DIAGNOSIS — M62838 Other muscle spasm: Secondary | ICD-10-CM

## 2017-10-11 DIAGNOSIS — F5102 Adjustment insomnia: Secondary | ICD-10-CM | POA: Diagnosis not present

## 2017-10-11 DIAGNOSIS — Z298 Encounter for other specified prophylactic measures: Secondary | ICD-10-CM

## 2017-10-11 DIAGNOSIS — E1122 Type 2 diabetes mellitus with diabetic chronic kidney disease: Secondary | ICD-10-CM | POA: Diagnosis present

## 2017-10-11 DIAGNOSIS — M7989 Other specified soft tissue disorders: Secondary | ICD-10-CM | POA: Diagnosis not present

## 2017-10-11 DIAGNOSIS — E1169 Type 2 diabetes mellitus with other specified complication: Secondary | ICD-10-CM | POA: Diagnosis not present

## 2017-10-11 DIAGNOSIS — R059 Cough, unspecified: Secondary | ICD-10-CM

## 2017-10-11 DIAGNOSIS — E8809 Other disorders of plasma-protein metabolism, not elsewhere classified: Secondary | ICD-10-CM

## 2017-10-11 DIAGNOSIS — Z2989 Encounter for other specified prophylactic measures: Secondary | ICD-10-CM

## 2017-10-11 DIAGNOSIS — E46 Unspecified protein-calorie malnutrition: Secondary | ICD-10-CM

## 2017-10-11 LAB — GLUCOSE, CAPILLARY
GLUCOSE-CAPILLARY: 290 mg/dL — AB (ref 65–99)
GLUCOSE-CAPILLARY: 309 mg/dL — AB (ref 65–99)
Glucose-Capillary: 268 mg/dL — ABNORMAL HIGH (ref 65–99)
Glucose-Capillary: 256 mg/dL — ABNORMAL HIGH (ref 65–99)

## 2017-10-11 LAB — CULTURE, BLOOD (ROUTINE X 2)
CULTURE: NO GROWTH
CULTURE: NO GROWTH
SPECIAL REQUESTS: ADEQUATE
SPECIAL REQUESTS: ADEQUATE

## 2017-10-11 MED ORDER — DEXAMETHASONE 4 MG PO TABS
4.0000 mg | ORAL_TABLET | Freq: Every day | ORAL | Status: DC
  Administered 2017-10-12 – 2017-10-15 (×4): 4 mg via ORAL
  Filled 2017-10-11 (×4): qty 1

## 2017-10-11 MED ORDER — INSULIN ASPART 100 UNIT/ML ~~LOC~~ SOLN
4.0000 [IU] | Freq: Three times a day (TID) | SUBCUTANEOUS | Status: DC
  Administered 2017-10-11 – 2017-10-14 (×9): 4 [IU] via SUBCUTANEOUS

## 2017-10-11 MED ORDER — ACETAMINOPHEN 500 MG PO TABS
500.0000 mg | ORAL_TABLET | Freq: Four times a day (QID) | ORAL | Status: DC | PRN
  Administered 2017-10-13 – 2017-10-19 (×3): 500 mg via ORAL
  Filled 2017-10-11 (×3): qty 1

## 2017-10-11 MED ORDER — DEXAMETHASONE 4 MG PO TABS: 4.0000 mg | ORAL_TABLET | Freq: Every day | ORAL | 0 refills | Status: DC

## 2017-10-11 MED ORDER — POTASSIUM CHLORIDE CRYS ER 10 MEQ PO TBCR
10.0000 meq | EXTENDED_RELEASE_TABLET | Freq: Every day | ORAL | Status: DC
  Administered 2017-10-12 – 2017-10-23 (×12): 10 meq via ORAL
  Filled 2017-10-11 (×12): qty 1

## 2017-10-11 MED ORDER — LEVETIRACETAM 500 MG PO TABS
500.0000 mg | ORAL_TABLET | Freq: Two times a day (BID) | ORAL | Status: DC
  Administered 2017-10-11 – 2017-10-23 (×24): 500 mg via ORAL
  Filled 2017-10-11 (×24): qty 1

## 2017-10-11 MED ORDER — UMECLIDINIUM BROMIDE 62.5 MCG/INH IN AEPB
1.0000 | INHALATION_SPRAY | Freq: Every day | RESPIRATORY_TRACT | Status: DC
  Administered 2017-10-12 – 2017-10-22 (×7): 1 via RESPIRATORY_TRACT
  Filled 2017-10-11: qty 7

## 2017-10-11 MED ORDER — INSULIN GLARGINE 100 UNIT/ML ~~LOC~~ SOLN
40.0000 [IU] | Freq: Every day | SUBCUTANEOUS | Status: DC
  Administered 2017-10-11 – 2017-10-20 (×10): 40 [IU] via SUBCUTANEOUS
  Filled 2017-10-11 (×10): qty 0.4

## 2017-10-11 MED ORDER — DEXAMETHASONE 4 MG PO TABS: 4.0000 mg | ORAL_TABLET | Freq: Two times a day (BID) | ORAL | Status: DC

## 2017-10-11 MED ORDER — LORATADINE 10 MG PO TABS: 10.0000 mg | ORAL_TABLET | Freq: Every day | ORAL | Status: DC | PRN

## 2017-10-11 MED ORDER — METFORMIN HCL 500 MG PO TABS
500.0000 mg | ORAL_TABLET | Freq: Every day | ORAL | Status: DC
  Administered 2017-10-12 – 2017-10-23 (×12): 500 mg via ORAL
  Filled 2017-10-11 (×12): qty 1

## 2017-10-11 MED ORDER — INSULIN ASPART 100 UNIT/ML ~~LOC~~ SOLN
0.0000 [IU] | Freq: Three times a day (TID) | SUBCUTANEOUS | Status: DC
  Administered 2017-10-11: 15 [IU] via SUBCUTANEOUS
  Administered 2017-10-12: 7 [IU] via SUBCUTANEOUS
  Administered 2017-10-12: 3 [IU] via SUBCUTANEOUS
  Administered 2017-10-13 (×2): 4 [IU] via SUBCUTANEOUS
  Administered 2017-10-14: 7 [IU] via SUBCUTANEOUS
  Administered 2017-10-14: 4 [IU] via SUBCUTANEOUS
  Administered 2017-10-15: 7 [IU] via SUBCUTANEOUS
  Administered 2017-10-15 – 2017-10-16 (×2): 11 [IU] via SUBCUTANEOUS
  Administered 2017-10-16 – 2017-10-17 (×2): 3 [IU] via SUBCUTANEOUS
  Administered 2017-10-17: 4 [IU] via SUBCUTANEOUS
  Administered 2017-10-18 (×2): 3 [IU] via SUBCUTANEOUS
  Administered 2017-10-19: 4 [IU] via SUBCUTANEOUS
  Administered 2017-10-20: 3 [IU] via SUBCUTANEOUS
  Administered 2017-10-20: 4 [IU] via SUBCUTANEOUS
  Administered 2017-10-21: 3 [IU] via SUBCUTANEOUS
  Administered 2017-10-21: 4 [IU] via SUBCUTANEOUS
  Administered 2017-10-22 (×2): 3 [IU] via SUBCUTANEOUS

## 2017-10-11 MED ORDER — DEXAMETHASONE 4 MG PO TABS
4.0000 mg | ORAL_TABLET | Freq: Every day | ORAL | Status: DC
  Administered 2017-10-11: 4 mg via ORAL
  Filled 2017-10-11: qty 1

## 2017-10-11 MED ORDER — ACETAMINOPHEN 500 MG PO TABS: 500.0000 mg | ORAL_TABLET | Freq: Four times a day (QID) | ORAL | 0 refills | Status: AC | PRN

## 2017-10-11 MED ORDER — LOSARTAN POTASSIUM 50 MG PO TABS
25.0000 mg | ORAL_TABLET | Freq: Every day | ORAL | Status: DC
  Administered 2017-10-12 – 2017-10-23 (×12): 25 mg via ORAL
  Filled 2017-10-11 (×12): qty 1

## 2017-10-11 MED ORDER — ROSUVASTATIN CALCIUM 10 MG PO TABS
10.0000 mg | ORAL_TABLET | Freq: Every day | ORAL | Status: DC
  Administered 2017-10-11 – 2017-10-22 (×12): 10 mg via ORAL
  Filled 2017-10-11 (×12): qty 1

## 2017-10-11 MED ORDER — METOPROLOL SUCCINATE ER 25 MG PO TB24
25.0000 mg | ORAL_TABLET | Freq: Every day | ORAL | Status: DC
  Administered 2017-10-12 – 2017-10-23 (×12): 25 mg via ORAL
  Filled 2017-10-11 (×12): qty 1

## 2017-10-11 MED ORDER — ONDANSETRON 4 MG PO TBDP
4.0000 mg | ORAL_TABLET | Freq: Three times a day (TID) | ORAL | Status: DC | PRN
  Filled 2017-10-11: qty 1

## 2017-10-11 MED ORDER — LEVETIRACETAM 500 MG PO TABS: 500.0000 mg | ORAL_TABLET | Freq: Two times a day (BID) | ORAL | 3 refills | Status: DC

## 2017-10-11 MED ORDER — LOSARTAN POTASSIUM 25 MG PO TABS: 25.0000 mg | ORAL_TABLET | Freq: Every day | ORAL | 3 refills | Status: DC

## 2017-10-11 MED ORDER — GABAPENTIN 100 MG PO CAPS
100.0000 mg | ORAL_CAPSULE | Freq: Every day | ORAL | Status: DC | PRN
  Administered 2017-10-22: 100 mg via ORAL
  Filled 2017-10-11: qty 1

## 2017-10-11 MED ORDER — ALBUTEROL SULFATE (2.5 MG/3ML) 0.083% IN NEBU: 2.5000 mg | INHALATION_SOLUTION | Freq: Four times a day (QID) | RESPIRATORY_TRACT | Status: DC | PRN

## 2017-10-11 MED ORDER — LINAGLIPTIN 5 MG PO TABS
5.0000 mg | ORAL_TABLET | Freq: Every day | ORAL | Status: DC
  Administered 2017-10-12 – 2017-10-15 (×4): 5 mg via ORAL
  Filled 2017-10-11 (×4): qty 1

## 2017-10-11 MED ORDER — PANTOPRAZOLE SODIUM 40 MG PO TBEC
40.0000 mg | DELAYED_RELEASE_TABLET | Freq: Every day | ORAL | Status: DC
  Administered 2017-10-12 – 2017-10-23 (×12): 40 mg via ORAL
  Filled 2017-10-11 (×12): qty 1

## 2017-10-11 MED ORDER — POLYETHYLENE GLYCOL 3350 17 G PO PACK: 17.0000 g | PACK | Freq: Every day | ORAL | Status: DC | PRN

## 2017-10-11 MED ORDER — HYDROCODONE-ACETAMINOPHEN 5-325 MG PO TABS
1.0000 | ORAL_TABLET | ORAL | Status: DC | PRN
  Administered 2017-10-14 – 2017-10-23 (×3): 1 via ORAL
  Filled 2017-10-11 (×3): qty 1

## 2017-10-11 MED ORDER — POLYETHYLENE GLYCOL 3350 17 G PO PACK: 17.0000 g | PACK | Freq: Every day | ORAL | 0 refills | Status: AC | PRN

## 2017-10-11 MED ORDER — ALPRAZOLAM 0.25 MG PO TABS
0.2500 mg | ORAL_TABLET | Freq: Every day | ORAL | Status: DC | PRN
  Administered 2017-10-11 – 2017-10-14 (×4): 0.25 mg via ORAL
  Filled 2017-10-11 (×4): qty 1

## 2017-10-11 MED ORDER — INSULIN ASPART 100 UNIT/ML ~~LOC~~ SOLN: 0.0000 [IU] | Freq: Three times a day (TID) | SUBCUTANEOUS | 11 refills | Status: DC

## 2017-10-11 MED ORDER — MECLIZINE HCL 25 MG PO TABS
25.0000 mg | ORAL_TABLET | Freq: Three times a day (TID) | ORAL | Status: DC
  Administered 2017-10-11 – 2017-10-23 (×25): 25 mg via ORAL
  Filled 2017-10-11 (×30): qty 1

## 2017-10-11 MED ORDER — INSULIN GLARGINE 100 UNIT/ML ~~LOC~~ SOLN: 40.0000 [IU] | Freq: Every day | SUBCUTANEOUS | 11 refills | Status: DC

## 2017-10-11 NOTE — Progress Notes (Signed)
Ref: Dixie Dials, MD   Subjective:  Cough continues. Improving blood pressure. Afebrile.  Objective:  Vital Signs in the last 24 hours: Temp:  [97.8 F (36.6 C)-98.2 F (36.8 C)] 98 F (36.7 C) (11/19 1004) Pulse Rate:  [61-72] 71 (11/19 1004) Resp:  [16-18] 18 (11/19 1004) BP: (113-131)/(53-67) 130/60 (11/19 1004) SpO2:  [93 %-96 %] 96 % (11/19 1004) Weight:  [105.4 kg (232 lb 5.8 oz)] 105.4 kg (232 lb 5.8 oz) (11/19 0500)  Physical Exam: BP Readings from Last 1 Encounters:  10/11/17 130/60     Wt Readings from Last 1 Encounters:  10/11/17 105.4 kg (232 lb 5.8 oz)    Weight change: 0.2 kg (7.1 oz) Body mass index is 36.39 kg/m. HEENT: [place]/AT, Eyes-Brown, PERL, EOMI, Conjunctiva-Pink, Sclera-Non-icteric Neck: No JVD, No bruit, Trachea midline. Lungs:  Clear, Bilateral. Cardiac:  Regular rhythm, normal S1 and S2, no S3. II/VI systolic murmur. Abdomen:  Soft, non-tender. BS present. Extremities:  No edema present. No cyanosis. No clubbing. Right sided weakness but improving strength. CNS: AxOx3, Cranial nerves grossly intact, moves all 4 extremities.  Skin: Warm and dry.   Intake/Output from previous day: 11/18 0701 - 11/19 0700 In: 100 [IV Piggyback:100] Out: -     Lab Results: BMET    Component Value Date/Time   NA 134 (L) 10/07/2017 0341   NA 132 (L) 10/05/2017 0415   NA 131 (L) 10/04/2017 1004   NA 131 (L) 09/28/2017 1355   K 4.0 10/07/2017 0341   K 4.1 10/05/2017 0415   K 3.9 10/04/2017 1004   K 4.5 09/28/2017 1355   CL 102 10/07/2017 0341   CL 98 (L) 10/05/2017 0415   CL 96 (L) 10/04/2017 1004   CO2 25 10/07/2017 0341   CO2 26 10/05/2017 0415   CO2 27 10/04/2017 1004   CO2 23 09/28/2017 1355   GLUCOSE 111 (H) 10/07/2017 0341   GLUCOSE 116 (H) 10/05/2017 0415   GLUCOSE 209 (H) 10/04/2017 1004   GLUCOSE 248 (H) 09/28/2017 1355   BUN 14 10/07/2017 0341   BUN 20 10/05/2017 0415   BUN 19 10/04/2017 1004   BUN 25.7 09/28/2017 1355   CREATININE 1.05  (H) 10/07/2017 0341   CREATININE 1.26 (H) 10/05/2017 0415   CREATININE 1.20 (H) 10/04/2017 1004   CREATININE 1.2 (H) 09/28/2017 1355   CALCIUM 8.6 (L) 10/07/2017 0341   CALCIUM 8.5 (L) 10/05/2017 0415   CALCIUM 8.8 (L) 10/04/2017 1004   CALCIUM 9.1 09/28/2017 1355   GFRNONAA 53 (L) 10/07/2017 0341   GFRNONAA 42 (L) 10/05/2017 0415   GFRNONAA 45 (L) 10/04/2017 1004   GFRAA >60 10/07/2017 0341   GFRAA 49 (L) 10/05/2017 0415   GFRAA 52 (L) 10/04/2017 1004   CBC    Component Value Date/Time   WBC 6.5 10/07/2017 0341   RBC 4.51 10/07/2017 0341   HGB 13.2 10/07/2017 0341   HGB 13.4 09/28/2017 1356   HCT 40.1 10/07/2017 0341   HCT 40.3 09/28/2017 1356   PLT 218 10/07/2017 0341   PLT 343 09/28/2017 1356   MCV 88.9 10/07/2017 0341   MCV 87.9 09/28/2017 1356   MCH 29.3 10/07/2017 0341   MCHC 32.9 10/07/2017 0341   RDW 13.3 10/07/2017 0341   RDW 13.7 09/28/2017 1356   LYMPHSABS 3.5 10/02/2017 0634   LYMPHSABS 0.9 09/28/2017 1356   MONOABS 0.8 10/02/2017 0634   MONOABS 0.5 09/28/2017 1356   EOSABS 0.2 10/02/2017 0634   EOSABS 0.0 09/28/2017 1356  BASOSABS 0.0 10/02/2017 0634   BASOSABS 0.1 09/28/2017 1356   HEPATIC Function Panel Recent Labs    09/28/17 1355 10/02/17 0634 10/05/17 0415  PROT 6.5 6.8 5.5*   HEMOGLOBIN A1C No components found for: HGA1C,  MPG CARDIAC ENZYMES Lab Results  Component Value Date   CKTOTAL 95 01/01/2009   CKMB 0.8 01/01/2009   TROPONINI <0.01        NO INDICATION OF MYOCARDIAL INJURY. 01/01/2009   TROPONINI <0.01        NO INDICATION OF MYOCARDIAL INJURY. 01/01/2009   TROPONINI <0.01        NO INDICATION OF MYOCARDIAL INJURY. 12/31/2008   BNP No results for input(s): PROBNP in the last 8760 hours. TSH No results for input(s): TSH in the last 8760 hours. CHOLESTEROL No results for input(s): CHOL in the last 8760 hours.  Scheduled Meds: . insulin aspart  0-20 Units Subcutaneous TID WC  . insulin aspart  4 Units Subcutaneous TID  WC  . insulin glargine  40 Units Subcutaneous QHS  . levETIRAcetam  500 mg Oral BID  . metFORMIN  500 mg Oral Q breakfast   And  . linagliptin  5 mg Oral Daily  . losartan  25 mg Oral Daily  . meclizine  25 mg Oral TID  . metoprolol succinate  25 mg Oral Daily  . pantoprazole  40 mg Oral Daily  . potassium chloride  10 mEq Oral Daily  . rosuvastatin  10 mg Oral Daily  . umeclidinium bromide  1 puff Inhalation Daily   Continuous Infusions: . sodium chloride Stopped (10/08/17 1425)  . cefTRIAXone (ROCEPHIN)  IV Stopped (10/10/17 1458)   PRN Meds:.acetaminophen, albuterol, ALPRAZolam, gabapentin, HYDROcodone-acetaminophen, loratadine, ondansetron, polyethylene glycol  Assessment/Plan: Dizziness -improving Positional vertigo-improving Acute stroke New onset seizure disorder Acute on chronic bronchitis Recent squamous cell CA of lung with mets Type II, DM Hypertension Obesity H/O tobacco use disorder  Continue PT. Continue IV antibiotic x 1-2 days.  Decrease Decadron to 4 mg. Daily.   LOS: 9 days    Dixie Dials  MD  10/11/2017, 10:11 AM

## 2017-10-11 NOTE — Care Management Important Message (Signed)
Important Message  Patient Details  Name: [name] MRN: [id number] Date of Birth: [birth year]   Medicare Important Message Given:  Yes    [name] 10/11/2017, 10:05 AM

## 2017-10-11 NOTE — Progress Notes (Signed)
Inpatient Rehabilitation  I have received insurance authorization, acute medical clearance, and have an IP Rehab bed available to offer patient today.  I will proceed with admission and have notified team.  Call if questions.  Carmelia Roller., CCC/SLP Admission Coordinator  [place]  Cell [phone]

## 2017-10-11 NOTE — Progress Notes (Signed)
Occupational Therapy Treatment Patient Details Name: [name] MRN: [id number] DOB: 1946/06/15 Today's Date: 10/11/2017    History of present illness 71 year old female with recent diagnosis of RUL lung  Nodule with metastatic lesion in left cerebral hemisphere has new onset of RUE becoming heavy and weak. CT head shows new 11 mm hemorrhagic metastatic deposit in left frontal lobe with surrounding edema.   OT comments  Pt progressing towards established OT goals and continues to demonstrating high motivation to participate in therapy. Pt continues to demonstrating poor functional use of RUE as seen during exercises and ADLs. Pt requiring Mod A +2 for sit<>stands with stedy and Max A +2 for side steps at EOB with Max VCs and RW. Continue to recommend dc to CIR for intensive therapy to optimize return to PLOF. Will continue to follow acutely to facilitate safe dc.    Follow Up Recommendations  CIR;Supervision/Assistance - 24 hour    Equipment Recommendations  3 in 1 bedside commode    Recommendations for Other Services PT consult    Precautions / Restrictions Precautions Precautions: Fall Restrictions Weight Bearing Restrictions: No       Mobility Bed Mobility Overal bed mobility: Needs Assistance Bed Mobility: Supine to Sit     Supine to sit: HOB elevated;Min assist     General bed mobility comments: minA for trunk to upright, pt able to mange LE off bed and scoot her hips to EoB  Transfers Overall transfer level: Needs assistance Equipment used: Rolling walker (2 wheeled) Transfers: Sit to/from Stand Sit to Stand: +2 physical assistance;Mod assist         General transfer comment: modA for power up to standing in Stedy x3, to RW x2, vc for hand placement and LE placement     Balance Overall balance assessment: Needs assistance Sitting-balance support: No upper extremity supported;Feet supported Sitting balance-Leahy Scale: Good     Standing balance support:  Bilateral upper extremity supported Standing balance-Leahy Scale: Poor Standing balance comment: pt requires physical assist at this time                           ADL either performed or assessed with clinical judgement   ADL Overall ADL's : Needs assistance/impaired     Grooming: Oral care;Min guard;Sitting(Stedy) Grooming Details (indicate cue type and reason): Pt performing oral care while sitting and standing at stedy. Pt dmeonstrating increased activity tolerance. Pt demonstrating decreased FM skills and in-hand manipulation to hold tooth brush.                             Functional mobility during ADLs: Maximal assistance;+2 for physical assistance;Rolling walker;Cueing for sequencing General ADL Comments: Pt demonstrating poor activity tolerance and funcitonal use of RUE/RLE. Educated pt on learned non-use.      Vision       Perception     Praxis      Cognition Arousal/Alertness: Awake/alert Behavior During Therapy: WFL for tasks assessed/performed Overall Cognitive Status: Impaired/Different from baseline Area of Impairment: Problem solving;Memory                     Memory: Decreased short-term memory       Problem Solving: Slow processing;Difficulty sequencing;Requires verbal cues;Requires tactile cues General Comments: pt with better ability to process multi-step tasks today, especially sequencing        Exercises Exercises: General Upper Extremity  General Exercises - Upper Extremity Elbow Flexion: AROM;Right;Seated;10 reps Elbow Extension: AROM;Right;Seated;10 reps Wrist Extension: AROM;Right;Seated;10 reps Composite Extension: AROM;Right;Both;Seated;10 reps Other Exercises Other Exercises: Educated pt on intensional movements of RUE to increase normal movements patterns in RUE.    Shoulder Instructions       General Comments      Pertinent Vitals/ Pain       Pain Assessment: No/denies pain  Home Living                                           Prior Functioning/Environment              Frequency  Min 3X/week        Progress Toward Goals  OT Goals(current goals can now be found in the care plan section)  Progress towards OT goals: Progressing toward goals  Acute Rehab OT Goals Patient Stated Goal: Go home OT Goal Formulation: With patient Time For Goal Achievement: 10/18/17 Potential to Achieve Goals: Good ADL Goals Pt Will Perform Grooming: with modified independence;standing Pt Will Perform Lower Body Dressing: with set-up;with supervision;sit to/from stand Pt Will Transfer to Toilet: with set-up;with supervision;bedside commode;ambulating Pt Will Perform Tub/Shower Transfer: Tub transfer;3 in 1;rolling walker;ambulating;with supervision;with set-up  Plan Discharge plan remains appropriate    Co-evaluation    PT/OT/SLP Co-Evaluation/Treatment: Yes Reason for Co-Treatment: Complexity of the patient's impairments (multi-system involvement) PT goals addressed during session: Mobility/safety with mobility OT goals addressed during session: ADL's and self-care      AM-PAC PT "6 Clicks" Daily Activity     Outcome Measure   Help from another person eating meals?: A Little Help from another person taking care of personal grooming?: A Little Help from another person toileting, which includes using toliet, bedpan, or urinal?: A Lot Help from another person bathing (including washing, rinsing, drying)?: A Lot Help from another person to put on and taking off regular upper body clothing?: A Little Help from another person to put on and taking off regular lower body clothing?: A Lot 6 Click Score: 15    End of Session Equipment Utilized During Treatment: Gait belt;Other (comment);Rolling walker(Stedy)  OT Visit Diagnosis: Unsteadiness on feet (R26.81);Other abnormalities of gait and mobility (R26.89);Muscle weakness (generalized) (M62.81);Other symptoms and signs  involving cognitive function;Hemiplegia and hemiparesis Hemiplegia - Right/Left: Right Hemiplegia - dominant/non-dominant: Dominant Hemiplegia - caused by: Cerebral infarction   Activity Tolerance Patient tolerated treatment well   Patient Left in bed;with call bell/phone within reach;with bed alarm set;with family/visitor present   Nurse Communication Mobility status        Time: 9937-1696 OT Time Calculation (min): 31 min  Charges: OT General Charges $OT Visit: 1 Visit OT Treatments $Self Care/Home Management : 8-22 mins  [place], OTR/L Acute Rehab Pager: [phone] Office: [place] 10/11/2017, 1:12 PM

## 2017-10-11 NOTE — Progress Notes (Signed)
Inpatient Diabetes Program Recommendations  AACE/ADA: New Consensus Statement on Inpatient Glycemic Control (2015)  Target Ranges:  Prepandial:   less than 140 mg/dL      Peak postprandial:   less than 180 mg/dL (1-2 hours)      Critically ill patients:  140 - 180 mg/dL   Results for [name], [name] (MRN [id number]) as of 10/11/2017 10:50  Ref. Range 10/10/2017 06:09 10/10/2017 11:23 10/10/2017 16:40 10/10/2017 21:35 10/11/2017 06:12  Glucose-Capillary Latest Ref Range: 65 - 99 mg/dL 294 (H) 336 (H) 232 (H) 223 (H) 268 (H)   Review of Glycemic Control  Diabetes history: DM2  Outpatient Diabetes medications: Janumet 50-500 mg daily Current orders for Inpatient glycemic control: Lantus 40 units QHS, Novolog 0-20 units TID with meals, Novolog 4 units TID with meals for meal coverage, Tradjenta 5 mg daily, Metformin 500 mg QAM; Decadron 4 mg daily  Inpatient Diabetes Program Recommendations: Correction (SSI): Please consider adding Novolog 0-5 units QHS for bedtime correction. Diet: Please consider discontinuing Regular diet and ordered Carb Modified diet.  NOTE: Noted Decadron was decreased to 4 mg daily today and Lantus was increased on 10/10/17. Recommend adding Novolog bedtime correction scale and changing diet to Carb Modified.   Thanks, Barnie Alderman, RN, MSN, CDE Diabetes Coordinator Inpatient Diabetes Program [phone] (Team Pager from 8am to 5pm)

## 2017-10-11 NOTE — Progress Notes (Signed)
Physical Therapy Treatment Patient Details Name: [name] MRN: [id number] DOB: 05/20/1946 Today's Date: 10/11/2017    History of Present Illness 71 year old female with recent diagnosis of RUL lung  Nodule with metastatic lesion in left cerebral hemisphere has new onset of RUE becoming heavy and weak. CT head shows new 11 mm hemorrhagic metastatic deposit in left frontal lobe with surrounding edema.    PT Comments    Pt has increased R sided strength today and as a result has improved functional mobility especially with sit<>stand and side-stepping. Pt also has had an improvement in her ability to sequence her mobility. Pt is currently, minA for bed mobility,  modAx2 for transfers and maxAx2 for side-stepping along the bed. Pt requires skilled PT in the acute setting to continue to progress her strengthening and mobility. D/c plan remains appropriate.   Follow Up Recommendations  CIR     Equipment Recommendations  Other (comment)(to be determined at next venue)    Recommendations for Other Services       Precautions / Restrictions Precautions Precautions: Fall Restrictions Weight Bearing Restrictions: No    Mobility  Bed Mobility Overal bed mobility: Needs Assistance Bed Mobility: Supine to Sit     Supine to sit: HOB elevated;Min assist     General bed mobility comments: minA for trunk to upright, pt able to mange LE off bed and scoot her hips to EoB  Transfers Overall transfer level: Needs assistance Equipment used: Rolling walker (2 wheeled) Transfers: Sit to/from Stand Sit to Stand: +2 physical assistance;Mod assist         General transfer comment: modA for power up to standing in [place] x3, to RW x2, vc for hand placement and LE placement   Ambulation/Gait Ambulation/Gait assistance: +2 physical assistance;Max assist Ambulation Distance (Feet): 5 Feet Assistive device: Rolling walker (2 wheeled) Gait Pattern/deviations: Step-to pattern;Shuffle;Decreased  step length - right Gait velocity: slow Gait velocity interpretation: Below normal speed for age/gender General Gait Details: modA for offweighting R LE and placement of R LE for side stepping from foot of bed to head of bed, vc for sequencing and R LE hip, knee flexion and purposeful placement of R foot   Stairs            Wheelchair Mobility    Modified Rankin (Stroke Patients Only)       Balance Overall balance assessment: Needs assistance Sitting-balance support: No upper extremity supported;Feet supported Sitting balance-Leahy Scale: Good     Standing balance support: Bilateral upper extremity supported Standing balance-Leahy Scale: Poor Standing balance comment: pt requires physical assist at this time                            Cognition Arousal/Alertness: Awake/alert Behavior During Therapy: WFL for tasks assessed/performed Overall Cognitive Status: Impaired/Different from baseline Area of Impairment: Problem solving;Memory                     Memory: Decreased short-term memory       Problem Solving: Slow processing;Difficulty sequencing;Requires verbal cues;Requires tactile cues General Comments: pt with better ability to process multi-step tasks today, especially sequencing      Exercises General Exercises - Upper Extremity Elbow Flexion: AROM;Right;5 reps;Seated Elbow Extension: AROM;Right;5 reps;Seated Wrist Extension: AROM;Right;5 reps;Seated Composite Extension: AROM;Right;Both;5 reps;Seated General Exercises - Lower Extremity Long Arc Quad: Right;Seated;5 reps;AROM Heel Slides: AROM;Right;5 reps;Seated Hip Flexion/Marching: Right;Seated;5 reps;AROM  Pertinent Vitals/Pain Pain Assessment: No/denies pain           PT Goals (current goals can now be found in the care plan section) Acute Rehab PT Goals PT Goal Formulation: With patient Time For Goal Achievement: 10/11/17 Potential to Achieve Goals:  Fair Progress towards PT goals: Progressing toward goals    Frequency    Min 4X/week      PT Plan Frequency needs to be updated;Discharge plan needs to be updated    Co-evaluation PT/OT/SLP Co-Evaluation/Treatment: Yes Reason for Co-Treatment: Complexity of the patient's impairments (multi-system involvement);For patient/therapist safety          AM-PAC PT "6 Clicks" Daily Activity  Outcome Measure  Difficulty turning over in bed (including adjusting bedclothes, sheets and blankets)?: A Lot Difficulty moving from lying on back to sitting on the side of the bed? : Unable Difficulty sitting down on and standing up from a chair with arms (e.g., wheelchair, bedside commode, etc,.)?: Unable Help needed moving to and from a bed to chair (including a wheelchair)?: A Lot Help needed walking in hospital room?: Total Help needed climbing 3-5 steps with a railing? : Total 6 Click Score: 8    End of Session Equipment Utilized During Treatment: Gait belt Activity Tolerance: (pt has increased R LE strength since last treatment session) Patient left: in chair;with call bell/phone within reach;with chair alarm set Nurse Communication: Mobility status PT Visit Diagnosis: Unsteadiness on feet (R26.81);Other symptoms and signs involving the nervous system (M75.449)     Time: 2010-0712 PT Time Calculation (min) (ACUTE ONLY): 27 min  Charges:  $Gait Training: 8-22 mins                    G Codes:       Maeson Purohit B. Migdalia Dk PT, DPT Acute Rehabilitation  [phone] Pager [phone]     [place] 10/11/2017, 9:59 AM

## 2017-10-11 NOTE — PMR Pre-admission (Signed)
PMR Admission Coordinator Pre-Admission Assessment  Patient: [name] is an 71 y.o., female MRN: [id number] DOB: 22-Aug-1946 Height: 5\' 7"  (170.2 cm) Weight: 105.4 kg (232 lb 5.8 oz)              Insurance Information HMO:     PPO: X     PCP:      IPA:      80/20:      OTHER:  PRIMARY: [employer] Medicare       Policy#: [id number]      Subscriber: Self CM Name: Su Hoff       Phone#: [phone]     Fax#: [phone] Pre-Cert#: [id number] approval given by Adela Lank with updates due 10/08/17 to Janett Billow    Employer: Retired  Benefits:  Phone #: [phone]     Name: Verified online, Availity  Eff. Date: 02/22/16     Deduct: $0      Out of Pocket Max: $4500      Life Max: N/A CIR: $250 a day, days 1-6 ($1500 max per admission)      SNF: $0 a day, days 1-20; $164 a day, days 21-100 Outpatient: PT/OT/SLP necessity     Co-Pay: $40 a visit  Home Health: PT/OT/SLP necessity, 100%      Co-Pay: none DME: 80%     Co-Pay: 20% Providers: In network   SECONDARY: None      Policy#:       Subscriber:  CM Name:       Phone#:      Fax#:  Pre-Cert#:       Employer:  Benefits:  Phone #:      Name:  Eff. 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 Terryis a 71 y.o.right handed femalewith history of diabetes mellitus, CKD stage III, COPD with tobacco abuse. Per chart review patient lives with spouse.Spouse works during the  day but daughter and family can assist.One level home to steps to entry.Patient was independent through October with ADLs and driving. 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 Earlie Server. Patient had been scheduled for a VATS right upper lobectomy. Nodule with metastatic lesion in left cerebral hemisphere and noted progressive weakness of right her right side as well as increasing back pain. MRI 09/23/2017 of the brain showed a 10 mm left posterior frontal mass/with moderate vasogenic edema. Decadron was added to decreased peri-infarct swelling. Underwent CT-guided core needle biopsy of right upper lobe nodule 09/24/2017. Intermittent bouts of altered mental status EEG negative for seizure. Maintained on Keppra for seizure prophylaxis. Oncology to follow-up after final pathology reports on plan  of care. Latest follow-up CT again shows superior left frontal lobe hemorrhagic metastasis with vasogenic edema 10/02/2017. Spiked a low-grade temperature. Rocephin added blood cultures negative and antibiotic later discontinued. Physical and occupational therapy evaluations completed. Patient with ongoing increased weakness of lower extremities limiting overall mobility and as of 10/06/2017 recommendations made for physical medicine rehabilitation consult. Patient was admitted for a comprehensive rehabilitation program 10/11/17.      Past Medical History  Past Medical History:  Diagnosis Date  . Anxiety   . Arthritis   . Cancer ([place])   . Chronic back pain greater than 3 months duration   . Chronic neck pain   . COPD (chronic obstructive pulmonary disease) ([place])   . Cough   . Diabetes mellitus   . Dyspnea   . GERD (gastroesophageal reflux disease)   . H/O hiatal hernia   . Headache(784.0)   . Hemorrhoids   . Herniated disc   . History of kidney stones   . Hx of echocardiogram 2010   normal EF  .  Hypertension   . Palpitations   . Shortness of breath on exertion 11/10/11   "sometimes"  . Vertigo   . Vertigo     Family History  family history includes Lung cancer in her sister.  Prior Rehab/Hospitalizations:  Has the patient had major surgery during 100 days prior to admission? Yes  Current Medications   Current Facility-Administered Medications:  .  0.9 %  sodium chloride infusion, , Intravenous, Continuous, Dixie Dials, MD, Stopped at 10/08/17 1425 .  acetaminophen (TYLENOL) tablet 500 mg, 500 mg, Oral, Q6H PRN, Dixie Dials, MD, 500 mg at 10/08/17 1449 .  albuterol (PROVENTIL) (2.5 MG/3ML) 0.083% nebulizer solution 2.5 mg, 2.5 mg, Nebulization, Q6H PRN, Dixie Dials, MD, 2.5 mg at 10/11/17 0453 .  ALPRAZolam Duanne Moron) tablet 0.25 mg, 0.25 mg, Oral, Daily PRN, Dixie Dials, MD, 0.25 mg at 10/11/17 0223 .  cefTRIAXone (ROCEPHIN) 1 g in dextrose 5 % 50 mL IVPB, 1 g, Intravenous, Q24H, Dixie Dials, MD, Stopped at 10/10/17 1458 .  dexamethasone (DECADRON) tablet 4 mg, 4 mg, Oral, Daily, Dixie Dials, MD, 4 mg at 10/11/17 1029 .  gabapentin (NEURONTIN) capsule 100 mg, 100 mg, Oral, Daily PRN, Dixie Dials, MD, 100 mg at 10/06/17 2246 .  HYDROcodone-acetaminophen (NORCO/VICODIN) 5-325 MG per tablet 1 tablet, 1 tablet, Oral, Q4H PRN, Dixie Dials, MD, 1 tablet at 10/09/17 0820 .  insulin aspart (novoLOG) injection 0-20 Units, 0-20 Units, Subcutaneous, TID WC, Dixie Dials, MD, 11 Units at 10/11/17 1234 .  insulin aspart (novoLOG) injection 4 Units, 4 Units, Subcutaneous, TID WC, Dixie Dials, MD, 4 Units at 10/11/17 1238 .  insulin glargine (LANTUS) injection 40 Units, 40 Units, Subcutaneous, QHS, Dixie Dials, MD, 40 Units at 10/10/17 2216 .  levETIRAcetam (KEPPRA) tablet 500 mg, 500 mg, Oral, BID, Dixie Dials, MD, 500 mg at 10/11/17 1030 .  metFORMIN (GLUCOPHAGE) tablet 500 mg, 500 mg, Oral, Q breakfast, 500 mg at 10/11/17 0808 **AND** linagliptin (TRADJENTA) tablet 5 mg,  5 mg, Oral, Daily, Dixie Dials, MD, 5 mg at 10/11/17 0807 .  loratadine (CLARITIN) tablet 10 mg, 10 mg, Oral, Daily PRN, Dixie Dials, MD, 10 mg at 10/03/17 1638 .  losartan (COZAAR) tablet 25 mg, 25 mg, Oral, Daily, Dixie Dials, MD, 25 mg at 10/11/17 1030 .  meclizine (ANTIVERT) tablet 25 mg, 25 mg, Oral, TID, Dixie Dials, MD, 25 mg at 10/11/17 1030 .  metoprolol succinate (TOPROL-XL) 24 hr tablet 25 mg, 25 mg,  Oral, Daily, Dixie Dials, MD, 25 mg at 10/11/17 1030 .  ondansetron (ZOFRAN-ODT) disintegrating tablet 4 mg, 4 mg, Oral, Q8H PRN, Dixie Dials, MD .  pantoprazole (PROTONIX) EC tablet 40 mg, 40 mg, Oral, Daily, Dixie Dials, MD, 40 mg at 10/11/17 1031 .  polyethylene glycol (MIRALAX / GLYCOLAX) packet 17 g, 17 g, Oral, Daily PRN, Dixie Dials, MD, 17 g at 10/09/17 0829 .  potassium chloride (K-DUR,KLOR-CON) CR tablet 10 mEq, 10 mEq, Oral, Daily, Dixie Dials, MD, 10 mEq at 10/11/17 1030 .  rosuvastatin (CRESTOR) tablet 10 mg, 10 mg, Oral, Daily, Doylene Canard, Ajay, MD, 10 mg at 10/10/17 1735 .  umeclidinium bromide (INCRUSE ELLIPTA) 62.5 MCG/INH 1 puff, 1 puff, Inhalation, Daily, Dixie Dials, MD, 1 puff at 10/10/17 0731  Facility-Administered Medications Ordered in Other Encounters:  .  fentaNYL (SUBLIMAZE) injection, , , Anesthesia Intra-op, Candis Shine, CRNA, 50 mcg at 09/09/17 0718 .  lactated ringers infusion, , , Continuous PRN, Harder, Rebeca Alert, CRNA .  lactated ringers infusion, , , Continuous PRN, Harder, Rebeca Alert, CRNA .  midazolam (VERSED) 5 MG/5ML injection, , , Anesthesia Intra-op, Harder, Blaire S, CRNA, 1 mg at 09/09/17 6144  Patients Current Diet: Diet regular Room service appropriate? Yes; Fluid consistency: Thin  Precautions / Restrictions Precautions Precautions: Fall Restrictions Weight Bearing Restrictions: No   Has the patient had 2 or more falls or a fall with injury in the past year?No  Prior Activity Level Community (5-7x/wk): Prior to  admission patient was retired, fully independent, and active.    Home Assistive Devices / Equipment Home Assistive Devices/Equipment: CBG Meter Home Equipment: Walker - 2 wheels  Prior Device Use: Indicate devices/aids used by the patient prior to current illness, exacerbation or injury? None of the above, implemented a rolling walker a couple days prior to admission.  Prior Functional Level Prior Function Level of Independence: Independent Comments: ADLs, IADLs, and driving  Self Care: Did the patient need help bathing, dressing, using the toilet or eating? Independent  Indoor Mobility: Did the patient need assistance with walking from room to room (with or without device)? Independent  Stairs: Did the patient need assistance with internal or external stairs (with or without device)? Independent  Functional Cognition: Did the patient need help planning regular tasks such as shopping or remembering to take medications? Independent  Current Functional Level Cognition  Overall Cognitive Status: Impaired/Different from baseline Orientation Level: Oriented X4 General Comments: pt with better ability to process multi-step tasks today, especially sequencing    Extremity Assessment (includes Sensation/Coordination)  Upper Extremity Assessment: RUE deficits/detail RUE Deficits / Details: RUE with decreased strength and coordination as seen during ADLs and testing. Pt requiring increased time and effort to perform coordination testing. pt able to perform finger opposition, bring hand ot mouth, and bring hand to top of head with increased time. RUE Coordination: decreased fine motor, decreased gross motor  Lower Extremity Assessment: Generalized weakness    ADLs  Overall ADL's : Needs assistance/impaired Eating/Feeding: Set up, Sitting Grooming: Oral care, Min guard, Sitting(Stedy) Grooming Details (indicate cue type and reason): Pt performing oral care while sitting and standing at  stedy. Pt dmeonstrating increased activity tolerance. Pt demonstrating decreased FM skills and in-hand manipulation to hold tooth brush. Upper Body Bathing: Min guard, Sitting Lower Body Bathing: Minimal assistance, Sit to/from stand Upper Body Dressing : Minimal assistance, Sitting Upper Body Dressing Details (indicate cue type and reason): Pt requiring Min A to don gown like a jacket while seated  Lower Body Dressing: Minimal assistance, Sit to/from stand Lower Body Dressing Details (indicate cue type and reason): Pt demonstrating decreased funcitonal performance for donning socks. Pt with decreased grasp strength and sock slipping out of pt's R hand throughout task.  Toilet Transfer: Minimal assistance, Ambulation, Regular Toilet, Grab bars Toilet Transfer Details (indicate cue type and reason): Min A for safe descent. Pt with posterior lean when sitting Toileting- Clothing Manipulation and Hygiene: Set up, Sitting/lateral lean Tub/ Shower Transfer: Moderate assistance, Ambulation, Tub transfer, 3 in 1, Rolling walker, Cueing for sequencing, Cueing for safety Tub/Shower Transfer Details (indicate cue type and reason): Pt requiring Max VCs and Mod physical A for tub transafer. Pt dmeosntrating poor sequencing and motor planning.  Functional mobility during ADLs: Maximal assistance, +2 for physical assistance, Rolling walker, Cueing for sequencing General ADL Comments: Pt demonstrating poor activity tolerance and funcitonal use of RUE/RLE. Educated pt on learned non-use.     Mobility  Overal bed mobility: Needs Assistance Bed Mobility: Supine to Sit Supine to sit: HOB elevated, Min assist Sit to supine: Supervision General bed mobility comments: minA for trunk to upright, pt able to mange LE off bed and scoot her hips to EoB    Transfers  Overall transfer level: Needs assistance Equipment used: Rolling walker (2 wheeled) Transfer via Lift Equipment: Stedy Transfers: Sit to/from [employer]  to Stand: +2 physical assistance, Mod assist General transfer comment: modA for power up to standing in [place] x3, to RW x2, vc for hand placement and LE placement     Ambulation / Gait / Stairs / Wheelchair Mobility  Ambulation/Gait Ambulation/Gait assistance: +2 physical assistance, Max assist Ambulation Distance (Feet): 5 Feet Assistive device: Rolling walker (2 wheeled) Gait Pattern/deviations: Step-to pattern, Shuffle, Decreased step length - right General Gait Details: modA for offweighting R LE and placement of R LE for side stepping from foot of bed to head of bed, vc for sequencing and R LE hip, knee flexion and purposeful placement of R foot Gait velocity: slow Gait velocity interpretation: Below normal speed for age/gender    Posture / Balance Dynamic Sitting Balance Sitting balance - Comments: Able to lean forward to adjust socks without difficulty Balance Overall balance assessment: Needs assistance Sitting-balance support: No upper extremity supported, Feet supported Sitting balance-Leahy Scale: Good Sitting balance - Comments: Able to lean forward to adjust socks without difficulty Standing balance support: Bilateral upper extremity supported Standing balance-Leahy Scale: Poor Standing balance comment: pt requires physical assist at this time    Special needs/care consideration BiPAP/CPAP: No CPM: No Continuous Drip IV: No Dialysis: No        Life Vest: No Oxygen: No Special Bed: No, but has padded bed rails  Trach Size: No Wound Vac (area): No       Skin: WDL                                Bowel mgmt: Continent, last BM 10/11/17 Bladder mgmt: Continent, but reports more urgency and frequency Diabetic mgmt: Yes, that she managed with diet, exercise, and medications      Previous Home Environment Living Arrangements: Spouse/significant other Available Help at Discharge: Family, Friend(s), Available 24 hours/day(Husband works as a Administrator, but friends  available) Type of Home: House Home Layout: One level Home Access: Stairs to enter Technical brewer of Steps: 2 Bathroom Shower/Tub: Public librarian, Architectural technologist: [place]: Other (Comment)  Discharge  Living Setting Plans for Discharge Living Setting: Patient's home, Lives with (comment)(Spouse) Type of Home at Discharge: House Discharge Home Layout: One level Discharge Home Access: Stairs to enter Entrance Stairs-Rails: None Entrance Stairs-Number of Steps: 3 Discharge Bathroom Shower/Tub: Tub/shower unit, Curtain Discharge Bathroom Toilet: Standard Discharge Bathroom Accessibility: Yes How Accessible: Accessible via walker Does the patient have any problems obtaining your medications?: No  Social/Family/Support Systems Patient Roles: Spouse, Parent, Other (Comment)(Grandparent ) Contact Information: Spouse: Trudee Kuster  Anticipated Caregiver: Spouse and daughter Pierce Crane  Anticipated Caregiver's Contact Information: Spouse Cell: [phone] Ability/Limitations of Caregiver: Spouse works but daughter and other family can assist  Caregiver Availability: 24/7 Discharge Plan Discussed with Primary Caregiver: Yes Is Caregiver In Agreement with Plan?: Yes Does Caregiver/Family have Issues with Lodging/Transportation while Pt is in Rehab?: No  Goals/Additional Needs Patient/Family Goal for Rehab: PT/OT Supervision-Min A Expected length of stay: 15-20 days  Cultural Considerations: [employer] Dietary Needs: Regular textures and thin liquids Equipment Needs: TBD Pt/Family Agrees to Admission and willing to participate: Yes Program Orientation Provided & Reviewed with Pt/Caregiver Including Roles  & Responsibilities: Yes Additional Information Needs: Acute medical MD decided to not get a Pallative consult, oncology to follow up as an outpatient, and patient's focus is to recover and regain her independence  Information Needs to be  Provided By: Team FYI  Decrease burden of Care through IP rehab admission: No  Possible need for SNF placement upon discharge: No  Patient Condition: This patient's medical and functional status has changed since the consult dated: 10/07/17 in which the Rehabilitation Physician determined and documented that the patient's condition is appropriate for intensive rehabilitative care in an inpatient rehabilitation facility. See "History of Present Illness" (above) for medical update. Functional changes are: Min A bed mobility and Mod A +2 transfers. Patient's medical and functional status update has been discussed with the Rehabilitation physician and patient remains appropriate for inpatient rehabilitation. Will admit to inpatient rehab today.  Preadmission Screen Completed By:  Gunnar Fusi, 10/11/2017 1:33 PM ______________________________________________________________________   Discussed status with Dr. Posey Pronto on 10/11/17 at 1350 and received telephone approval for admission today.  Admission Coordinator:  Gunnar Fusi, time 1350/Date 10/11/17

## 2017-10-11 NOTE — IPOC Note (Signed)
Overall Plan of Care [facility]) Patient Details Name: [name] MRN: [id number] DOB: 1946-10-28  Admitting Diagnosis: Lung CA with brain mets  Hospital Problems: Active Problems:   Metastatic lung cancer (metastasis from lung to other site) [facility])   Hypoalbuminemia due to protein-calorie malnutrition ([place])   Leukocytosis     Functional Problem List: Nursing Endurance, Motor, Safety, Other (comment)  PT Balance, Safety, Endurance, Motor, Perception  OT Balance, Motor, Endurance  SLP Cognition  TR         Basic ADL's: OT Grooming, Bathing, Dressing, Toileting     Advanced  ADL's: OT       Transfers: PT Bed Mobility, Bed to Chair, Car, Manufacturing systems engineer, Metallurgist: PT Ambulation, Stairs, Emergency planning/management officer     Additional Impairments: OT Fuctional Use of Upper Extremity  SLP Social Cognition   Problem Solving, Memory, Awareness  TR      Anticipated Outcomes Item Anticipated Outcome  Self Feeding independent  Swallowing      Basic self-care  supervision  Toileting  supervisoin   Bathroom Transfers supervision  Bowel/Bladder  Continent to bladder and bladder with min. assist.  Transfers  S  Locomotion  Min A stairs, S gait  Communication     Cognition  mod I   Pain  Less than 3,on 1 to 10 scale.  Safety/Judgment  Free from falls during her stay in rehab.   Therapy Plan: PT Intensity: Minimum of 1-2 x/day ,45 to 90 minutes PT Frequency: 5 out of 7 days PT Duration Estimated Length of Stay: 17-21 days OT Intensity: Minimum of 1-2 x/day, 45 to 90 minutes OT Frequency: 5 out of 7 days OT Duration/Estimated Length of Stay: 17-21 days SLP Intensity: Minumum of 1-2 x/day, 30 to 90 minutes SLP Frequency: 3 to 5 out of 7 days SLP Duration/Estimated Length of Stay: 17-21 days     Team Interventions: Nursing Interventions Patient/Family Education, Disease Management/Prevention  PT interventions Ambulation/gait training, Stair training,  UE/LE Strength taining/ROM, Wheelchair propulsion/positioning, Engineer, drilling, Training and development officer, Therapeutic Activities, UE/LE Coordination activities, Functional mobility training, Patient/family education, Therapeutic Exercise  OT Interventions Balance/vestibular training, Discharge planning, Self Care/advanced ADL retraining, Therapeutic Activities, UE/LE Coordination activities, Cognitive remediation/compensation, Disease mangement/prevention, Functional mobility training, Patient/family education, Therapeutic Exercise, Community reintegration, UE/LE Strength taining/ROM, Curator, DME/adaptive equipment instruction  SLP Interventions Cognitive remediation/compensation, English as a second language teacher, Functional tasks, Patient/family education, Environmental controls, Internal/external aids  TR Interventions    SW/CM Interventions Discharge Planning, Psychosocial Support, Patient/Family Education   Barriers to Discharge MD  Medical stability  Nursing      PT      OT      SLP      SW       Team Discharge Planning: Destination: PT-Home ,OT- Home , SLP-Home Projected Follow-up: PT-Home health PT, OT-  24 hour supervision/assistance, Other (comment)(TBD), SLP-Other (comment)(TBD) Projected Equipment Needs: PT-Rolling walker with 5" wheels, OT- To be determined, SLP-None recommended by SLP Equipment Details: PT- , OT-  Patient/family involved in discharge planning: PT- Patient,  OT-Patient, SLP-Patient  MD ELOS: 17-20 days. Medical Rehab Prognosis:  Fair Assessment:  71 y.o. right handed female with history of diabetes mellitus, CKD stage III, COPD with tobacco abuse. 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 Earlie Server. Patient had been scheduled for a VATS right upper lobectomy. Nodule with metastatic lesion in left cerebral hemisphere and noted  progressive weakness of right her right side as well as increasing back pain . MRI 09/23/2017 of the brain reviewed, showing left frontal lesion.  Per report, 10 mm left posterior frontal mass/with moderate vasogenic edema. Decadron was added to decreased peri-infarct swelling. Underwent CT-guided core needle biopsy of right upper lobe nodule 09/24/2017. Intermittent bouts of altered mental status EEG negative for seizure. Maintained on Keppra for seizure prophylaxis. Oncology to follow-up after final pathology reports on plan of care. Latest follow-up CT again shows superior left frontal lobe hemorrhagic metastasis with vasogenic edema 10/02/2017. Spiked a low-grade temperature and rocephin added.  Blood cultures negative and antibiotic later discontinued. Physical and occupational therapy evaluations completed. Patient with ongoing increased weakness of lower extremities limiting overall mobility. Will set goals for Supervision/Min A with PT/OT.   See Team Conference Notes for weekly updates to the plan of care

## 2017-10-11 NOTE — H&P (Signed)
Physical Medicine and Rehabilitation Admission H&P    Chief Complaint  Patient presents with  . Numbness  : HPI: [name] is a 71 y.o. right handed female with history of diabetes mellitus, CKD stage III, COPD with tobacco abuse. Per chart review patient lives with spouse. Spouse works during the day but daughters can assist. One level home to steps to entry. Patient was independent through October with ADLs and driving. 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 Earlie Server. Patient had been scheduled for a VATS right upper lobectomy. Nodule with metastatic lesion in left cerebral hemisphere and noted progressive weakness of right her right side as well as increasing back pain . MRI 09/23/2017 of the brain reviewed, showing left frontal lesion.  Per report, 10 mm left posterior frontal mass/with moderate vasogenic edema. Decadron was added to decreased peri-infarct swelling. Underwent CT-guided core needle biopsy of right upper lobe nodule 09/24/2017. Intermittent bouts of altered mental status EEG negative for seizure. Maintained on Keppra for seizure prophylaxis. Oncology to follow-up after final pathology reports on plan of care. Latest follow-up CT again shows superior left frontal lobe hemorrhagic metastasis with vasogenic edema 10/02/2017. Spiked a low-grade temperature and rocephin added.  Blood cultures negative and antibiotic later discontinued. Physical and occupational therapy evaluations completed. Patient with ongoing increased weakness of lower extremities limiting overall mobility and as of 10/06/2017 recommendations made for physical medicine rehabilitation consult. Patient was admitted for a comprehensive rehabilitation program   Review of Systems  Constitutional: Negative for fever.  HENT: Negative for hearing loss.   Eyes: Negative for blurred vision and double vision.    Respiratory: Positive for cough and shortness of breath.   Cardiovascular: Negative for leg swelling.  Gastrointestinal: Positive for constipation. Negative for nausea and vomiting.  Genitourinary: Negative for dysuria, flank pain and hematuria.  Musculoskeletal: Positive for joint pain and myalgias.  Skin: Negative for rash.  Neurological: Positive for dizziness, focal weakness and headaches.  Psychiatric/Behavioral:       Anxiety  All other systems reviewed and are negative.  Past Medical History:  Diagnosis Date  . Anxiety   . Arthritis   . Cancer ([place])   . Chronic back pain greater than 3 months duration   . Chronic neck pain   . COPD (chronic obstructive pulmonary disease) ([place])   . Cough   . Diabetes mellitus   . Dyspnea   . GERD (gastroesophageal reflux disease)   . H/O hiatal hernia   . Headache(784.0)   . Hemorrhoids   . Herniated disc   . History of kidney stones   . Hx of echocardiogram 2010   normal EF  . Hypertension   . Palpitations   . Shortness of breath on exertion 11/10/11   "sometimes"  . Vertigo   . Vertigo    Past Surgical History:  Procedure Laterality Date  . ABDOMINAL HYSTERECTOMY  1997?   "partial"  . ANTERIOR CERVICAL DECOMPRESSION/DISCECTOMY FUSION CERVICAL FOUR CERVICAL FIVE N/A 12/25/2016   Performed by Ashok Pall, MD at [place]  ~ 2008  . COLONOSCOPY    . EYE SURGERY     cateracts  . FRACTURE SURGERY  2004?   right shoulder  . KNEE ARTHROSCOPY  2004   right  . KNEE SURGERY     right  . neck fusion    . SHOULDER OPEN ROTATOR CUFF REPAIR  07/20/2011  left  . TONSILLECTOMY     Family History  Problem Relation Age of Onset  . Lung cancer Sister    Social History:  reports that she quit smoking about 3 months ago. Her smoking use included cigarettes. She has a 7.50 pack-year smoking history. she has never used smokeless tobacco. She reports that she does not drink alcohol or use drugs. Allergies:  Allergies   Allergen Reactions  . Oxycodone Hives and Itching   Medications Prior to Admission  Medication Sig Dispense Refill  . albuterol (PROVENTIL) (2.5 MG/3ML) 0.083% nebulizer solution USE 1 VIAL EVERY 6 HOURS AS NEEDED FOR SHORTNESS OF BREATH  0  . albuterol (VENTOLIN HFA) 108 (90 Base) MCG/ACT inhaler Inhale 2 puffs into the lungs every 6 (six) hours as needed for wheezing or shortness of breath.    . ALPRAZolam (XANAX) 0.25 MG tablet Take 0.25 mg by mouth daily as needed for anxiety.   2  . amLODipine (NORVASC) 5 MG tablet Take 5 mg by mouth 2 (two) times daily.     . cholecalciferol (VITAMIN D) 1000 units tablet Take 1,000 Units by mouth daily.    . cloNIDine (CATAPRES) 0.1 MG tablet Take 0.1 mg by mouth 2 (two) times daily as needed (high blood pressure).     . conjugated estrogens (PREMARIN) vaginal cream Place 0.5 Applicatorfuls vaginally at bedtime as needed.    . cyclobenzaprine (FLEXERIL) 10 MG tablet Take 10 mg by mouth at bedtime as needed for muscle spasms.    Marland Kitchen dexamethasone (DECADRON) 4 MG tablet Take 4 mg 3 (three) times daily by mouth.    . esomeprazole (NEXIUM) 40 MG capsule Take 40 mg daily as needed by mouth (acid reflux).   2  . gabapentin (NEURONTIN) 100 MG capsule Take 100 mg by mouth daily as needed (for leg pain).   1  . hydrALAZINE (APRESOLINE) 50 MG tablet Take 50 mg 2 (two) times daily by mouth.     . INCRUSE ELLIPTA 62.5 MCG/INH AEPB TAKE 1 PUFF BY MOUTH EVERY DAY AS NEEDED FOR SOB  5  . JANUMET 50-500 MG tablet Take 1 tablet by mouth daily.  6  . KLOR-CON M10 10 MEQ tablet Take 1 tablet (10 mEq total) by mouth daily. (Patient taking differently: Take 10 mEq by mouth 2 (two) times daily. )    . levETIRAcetam (KEPPRA) 500 MG tablet Take 1 tablet (500 mg total) by mouth 2 (two) times daily. 60 tablet 0  . loratadine (CLARITIN) 10 MG tablet Take 10 mg by mouth daily as needed for allergies.    Marland Kitchen meclizine (ANTIVERT) 25 MG tablet Take 25 mg daily as needed by mouth for  dizziness or nausea.   1  . metoprolol succinate (TOPROL-XL) 25 MG 24 hr tablet Take 25 mg by mouth daily.  1  . ondansetron (ZOFRAN ODT) 4 MG disintegrating tablet Take 1 tablet (4 mg total) by mouth every 8 (eight) hours as needed for nausea or vomiting. (Patient taking differently: Take 4 mg by mouth every 8 (eight) hours as needed for nausea or vomiting. DISSOLVE IN THE MOUTH) 20 tablet 0  . rosuvastatin (CRESTOR) 10 MG tablet Take 10 mg by mouth daily.      Drug Regimen Review Drug regimen was reviewed and remains appropriate with no significant issues identified  Home: Home Living Family/patient expects to be discharged to:: Private residence Living Arrangements: Spouse/significant other Available Help at Discharge: Family, Friend(s), Available 24 hours/day(Husband works as a Administrator, but  friends available) Type of Home: House Home Access: Stairs to enter Technical brewer of Steps: 2 Home Layout: One level Bathroom Shower/Tub: Tub/shower unit, Architectural technologist: Standard Home Equipment: Environmental consultant - 2 wheels   Functional History: Prior Function Level of Independence: Independent Comments: ADLs, IADLs, and driving  Functional Status:  Mobility: Bed Mobility Overal bed mobility: Needs Assistance Bed Mobility: Supine to Sit Supine to sit: HOB elevated, Min assist Sit to supine: Supervision General bed mobility comments: minA for trunk to upright, pt able to mange LE off bed and scoot her hips to EoB Transfers Overall transfer level: Needs assistance Equipment used: Rolling walker (2 wheeled) Transfer via Lift Equipment: Stedy Transfers: Sit to/from [employer] to Stand: +2 physical assistance, Mod assist General transfer comment: modA for power up to standing in [place] x3, to RW x2, vc for hand placement and LE placement  Ambulation/Gait Ambulation/Gait assistance: +2 physical assistance, Max assist Ambulation Distance (Feet): 5 Feet Assistive device: Rolling  walker (2 wheeled) Gait Pattern/deviations: Step-to pattern, Shuffle, Decreased step length - right General Gait Details: modA for offweighting R LE and placement of R LE for side stepping from foot of bed to head of bed, vc for sequencing and R LE hip, knee flexion and purposeful placement of R foot Gait velocity: slow Gait velocity interpretation: Below normal speed for age/gender    ADL: ADL Overall ADL's : Needs assistance/impaired Eating/Feeding: Set up, Sitting Grooming: Oral care, Min guard, Sitting(Stedy) Grooming Details (indicate cue type and reason): Pt performing oral care while sitting and standing at stedy. Pt dmeonstrating increased activity tolerance. Pt demonstrating decreased FM skills and in-hand manipulation to hold tooth brush. Upper Body Bathing: Min guard, Sitting Lower Body Bathing: Minimal assistance, Sit to/from stand Upper Body Dressing : Minimal assistance, Sitting Upper Body Dressing Details (indicate cue type and reason): Pt requiring Min A to don gown like a jacket while seated Lower Body Dressing: Minimal assistance, Sit to/from stand Lower Body Dressing Details (indicate cue type and reason): Pt demonstrating decreased funcitonal performance for donning socks. Pt with decreased grasp strength and sock slipping out of pt's R hand throughout task.  Toilet Transfer: Minimal assistance, Ambulation, Regular Toilet, Grab bars Toilet Transfer Details (indicate cue type and reason): Min A for safe descent. Pt with posterior lean when sitting Toileting- Clothing Manipulation and Hygiene: Set up, Sitting/lateral lean Tub/ Shower Transfer: Moderate assistance, Ambulation, Tub transfer, 3 in 1, Rolling walker, Cueing for sequencing, Cueing for safety Tub/Shower Transfer Details (indicate cue type and reason): Pt requiring Max VCs and Mod physical A for tub transafer. Pt dmeosntrating poor sequencing and motor planning.  Functional mobility during ADLs: Maximal assistance,  +2 for physical assistance, Rolling walker, Cueing for sequencing General ADL Comments: Pt demonstrating poor activity tolerance and funcitonal use of RUE/RLE. Educated pt on learned non-use.   Cognition: Cognition Overall Cognitive Status: Impaired/Different from baseline Orientation Level: Oriented X4 Cognition Arousal/Alertness: Awake/alert Behavior During Therapy: WFL for tasks assessed/performed Overall Cognitive Status: Impaired/Different from baseline Area of Impairment: Problem solving, Memory Memory: Decreased short-term memory Awareness: Intellectual(R sided intattention) Problem Solving: Slow processing, Difficulty sequencing, Requires verbal cues, Requires tactile cues General Comments: pt with better ability to process multi-step tasks today, especially sequencing  Physical Exam: Blood pressure 130/60, pulse 71, temperature 98 F (36.7 C), temperature source Oral, resp. rate 18, height 5\' 7"  (1.702 m), weight 105.4 kg (232 lb 5.8 oz), SpO2 96 %. Physical Exam  Vitals reviewed. Constitutional: She appears well-developed.  Obese  HENT:  Head: Normocephalic and atraumatic.  Eyes: EOM are normal. Right eye exhibits no discharge. Left eye exhibits no discharge.  Neck: Normal range of motion. Neck supple. No thyromegaly present.  Cardiovascular: Normal rate and regular rhythm.  Respiratory: Effort normal and breath sounds normal. No respiratory distress.  GI: Soft. Bowel sounds are normal. She exhibits no distension.  Musculoskeletal: She exhibits no edema or tenderness.  Neurological: She is alert.  Makes good eye contact with examiner.  Good sitting balance Followed simple commands.  She provides name agent date of birth.   Motor: RUE: 3/5 proximal to distal with apraxia RLE: 2/5 HF, 2+/5 KE, ADF/PF LUE: 4+/5 proximal to distal LLE: 5/5 proximal to distal Sensation intact to light touch  Skin: Skin is warm and dry.  Psychiatric: She has a normal mood and affect.  Her behavior is normal.    Results for orders placed or performed during the hospital encounter of 10/02/17 (from the past 48 hour(s))  Glucose, capillary     Status: Abnormal   Collection Time: 10/09/17  5:01 PM  Result Value Ref Range   Glucose-Capillary 236 (H) 65 - 99 mg/dL   Comment 1 Notify RN    Comment 2 Document in Chart   Glucose, capillary     Status: Abnormal   Collection Time: 10/09/17  9:35 PM  Result Value Ref Range   Glucose-Capillary 256 (H) 65 - 99 mg/dL   Comment 1 Notify RN    Comment 2 Document in Chart   Glucose, capillary     Status: Abnormal   Collection Time: 10/10/17  6:09 AM  Result Value Ref Range   Glucose-Capillary 294 (H) 65 - 99 mg/dL   Comment 1 Notify RN    Comment 2 Document in Chart   Glucose, capillary     Status: Abnormal   Collection Time: 10/10/17 11:23 AM  Result Value Ref Range   Glucose-Capillary 336 (H) 65 - 99 mg/dL  Glucose, capillary     Status: Abnormal   Collection Time: 10/10/17  4:40 PM  Result Value Ref Range   Glucose-Capillary 232 (H) 65 - 99 mg/dL  Glucose, capillary     Status: Abnormal   Collection Time: 10/10/17  9:35 PM  Result Value Ref Range   Glucose-Capillary 223 (H) 65 - 99 mg/dL   Comment 1 Notify RN    Comment 2 Document in Chart   Glucose, capillary     Status: Abnormal   Collection Time: 10/11/17  6:12 AM  Result Value Ref Range   Glucose-Capillary 268 (H) 65 - 99 mg/dL   Comment 1 Notify RN    Comment 2 Document in Chart   Glucose, capillary     Status: Abnormal   Collection Time: 10/11/17 11:30 AM  Result Value Ref Range   Glucose-Capillary 290 (H) 65 - 99 mg/dL   No results found.   Medical Problem List and Plan: 1.  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 Decadron added to decreased peri-infarct swelling and taper as needed 2.  DVT Prophylaxis/Anticoagulation: SCDs. Monitor for any signs of DVT. Check vascular study 3. Pain  Management: Hydrocodone and Neurontin as needed 4. Mood: Xanax 0.25 mg daily as needed 5. Neuropsych: This patient is capable of making decisions on her own behalf. 6. Skin/Wound Care: Routine skin checks 7. Fluids/Electrolytes/Nutrition: Routine I&O's with follow-up chemistries 8. Seizure prophylaxis. Keppra 500 mg twice a day 9. Diabetes mellitus and peripheral neuropathy. Hemoglobin  A1c 8.1. NovoLog 4 units 3 times a day, Lantus insulin 40 units daily, Glucophage 500 mg daily, Tradjenta 5 mg daily. Check blood sugars before meals and at bedtime 10. Hypertension. Cozaar 25 mg daily, Toprol 25 mg daily, Norvasc 5 mg twice a day,Hydralazine 25 mg twice daily. Monitor with increased mobility 11. COPD with tobacco abuse. Continue inhalers. Provide counseling 12. CKD stage III. Creatinine baseline 1.28. Follow-up chemistries 13. Hyponatremia. 130-134. Follow-up chemistries 14. Constipation. Laxative assistance 15. Hyperlipidemia. Crestor   Post Admission Physician Evaluation: 1. Preadmission assessment reviewed and changes made below. 2. Functional deficits secondary  to Lung cancer with left brain mets. 3. Patient is admitted to receive collaborative, interdisciplinary care between the physiatrist, rehab nursing staff, and therapy team. 4. Patient's level of medical complexity and substantial therapy needs in context of that medical necessity cannot be provided at a lesser intensity of care such as a SNF. 5. Patient has experienced substantial functional loss from his/her baseline which was documented above under the "Functional History" and "Functional Status" headings.  Judging by the patient's diagnosis, physical exam, and functional history, the patient has potential for functional progress which will result in measurable gains while on inpatient rehab.  These gains will be of substantial and practical use upon discharge  in facilitating mobility and self-care at the household  level. 6. Physiatrist will provide 24 hour management of medical needs as well as oversight of the therapy plan/treatment and provide guidance as appropriate regarding the interaction of the two. 7. 24 hour rehab nursing will assist with safety, disease management, pain management and patient education  and help integrate therapy concepts, techniques,education, etc. 8. PT will assess and treat for/with: Lower extremity strength, range of motion, stamina, balance, functional mobility, safety, adaptive techniques and equipment, coping skills, pain control, education.   Goals are: Min/Mod a. 9. OT will assess and treat for/with: ADL's, functional mobility, safety, upper extremity strength, adaptive techniques and equipment, ego support, and community reintegration.   Goals are: Min/Mod A. Therapy Lynesha proceed with showering this patient. 10. Case Management and Social Worker will assess and treat for psychological issues and discharge planning. 11. Team conference will be held weekly to assess progress toward goals and to determine barriers to discharge. 12. Patient will receive at least 3 hours of therapy per day at least 5 days per week. 13. ELOS: 17-22 days.       14. Prognosis:  good and fair     Delice Lesch, MD, ABPMR Lavon Paganini Angiulli, PA-C 10/11/2017

## 2017-10-11 NOTE — Care Management Note (Signed)
Case Management Note  Patient Details  Name: [name] MRN: [id number] Date of Birth: 07-Feb-1946  Subjective/Objective:                    Action/Plan: Pt discharging to CIR today. No further needs per CM.  Expected Discharge Date:  10/11/17               Expected Discharge Plan:  [place]  In-House Referral:     Discharge planning Services  CM Consult  Post Acute Care Choice:    Choice offered to:     DME Arranged:    DME Agency:     HH Arranged:    HH Agency:     Status of Service:  Completed, signed off  If discussed at [employer], dates discussed:    Additional Comments:  Pollie Friar, RN 10/11/2017, 2:49 PM

## 2017-10-11 NOTE — Progress Notes (Signed)
Received pt. As a transfer.Pt. And her family have been oriented to the unit routine and protocol.Safety plan was explained,fall prevention plan was signed by pt. And RN.

## 2017-10-11 NOTE — Discharge Summary (Signed)
Physician Discharge Summary  Patient ID: Kala F Vanderlinden MRN: [id number] DOB/AGE: 70/21/1947 70 y.o.  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 She was seen by neurology service and started on IV Keppra.  She had a significant weakness in the right upper and lower extremity that responded well to Decadron and physical therapy. She also had a positional vertigo and dizziness with low blood pressure. Her blood pressure medications were adjusted and meclizine was given for dizziness.   Her radiation therapy will be picked up by radiation oncology per Dr. Mohamud/Oncologist.  Rehab consult was obtained. Insurance authorization was also obtained and the patient was discharged in stable condition for inpatient rehabilitation services to readmit.  Consults: neurology  Significant Diagnostic Studies: labs: Normal CBC, Low sodium of 130 meq. Improving to 134 meq. Blood sugar elevated at 248 mg. Creatinine elevated at  1.2. Hgb A1C is 8.1.  EKG: Sinus rhythm.  CT brain : 11.5 mm hyperdense nodule with edema compatible with hemorrhagic metastatic disease.  EEG: Evidence of seizure discharge.  Treatments: IV hydration, antibiotics: ceftriaxone and respiratory therapy: Albuterol nebulizer and MDI treatments.  Discharge Exam: Blood pressure 130/60, pulse 71, temperature 98 F (36.7 C), temperature source Oral, resp. rate 18, height 5\' 7"  (1.702 m), weight 105.4 kg (232 lb 5.8 oz), SpO2 96 %. General appearance: alert, cooperative and appears stated age. Head: Normocephalic, atraumatic. Eyes: Brown eyes, pink conjunctiva, corneas clear. PERRL, EOM's intact.  Neck: No adenopathy, no carotid bruit, no JVD, supple, symmetrical, trachea midline and thyroid not enlarged. Resp: Clear to auscultation bilaterally. Cardio: Regular rate and rhythm, S1, S2 normal, II/VI systolic murmur, no click, rub or gallop. GI: Soft, non-tender; bowel sounds normal; no organomegaly. Extremities: No edema, cyanosis or clubbing. Right sided weakness with variable strength. Skin: Warm and dry.  Neurologic: Alert and oriented X 3, right sided weakness with mildly thick speech.  Disposition: 01-Home or Self Care   Allergies as of 10/11/2017      Reactions   Oxycodone Hives, Itching      Medication List    STOP taking these medications   amLODipine 5 MG tablet Commonly known as:  NORVASC   cloNIDine 0.1 MG tablet Commonly known as:  CATAPRES   cyclobenzaprine 10 MG tablet Commonly known as:  FLEXERIL   hydrALAZINE 50 MG tablet Commonly known as:  APRESOLINE     TAKE these medications   acetaminophen 500 MG tablet Commonly known as:  TYLENOL Take 1 tablet (500 mg total) every 6 (six) hours as needed by mouth for fever.   VENTOLIN HFA 108 (90 Base) MCG/ACT inhaler Generic drug:  albuterol Inhale 2 puffs into the lungs  every 6 (six) hours as needed for wheezing or shortness of breath.   albuterol (2.5 MG/3ML)  0.083% nebulizer solution Commonly known as:  PROVENTIL USE 1 VIAL EVERY 6 HOURS AS NEEDED FOR SHORTNESS OF BREATH   ALPRAZolam 0.25 MG tablet Commonly known as:  XANAX Take 0.25 mg by mouth daily as needed for anxiety.   cholecalciferol 1000 units tablet Commonly known as:  VITAMIN D Take 1,000 Units by mouth daily.   conjugated estrogens vaginal cream Commonly known as:  PREMARIN Place 0.5 Applicatorfuls vaginally at bedtime as needed.   dexamethasone 4 MG tablet Commonly known as:  DECADRON Take 1 tablet (4 mg total) daily by mouth. After 3 days decrease dose to 2 mg. Daily x 1 week then 1 mg. Daily x 2 weeks. Start taking on:  10/12/2017 What changed:    when to take this  additional instructions   esomeprazole 40 MG capsule Commonly known as:  NEXIUM Take 40 mg daily as needed by mouth (acid reflux).   gabapentin 100 MG capsule Commonly known as:  NEURONTIN Take 100 mg by mouth daily as needed (for leg pain).   INCRUSE ELLIPTA 62.5 MCG/INH Aepb Generic drug:  umeclidinium bromide TAKE 1 PUFF BY MOUTH EVERY DAY AS NEEDED FOR SOB   insulin aspart 100 UNIT/ML injection Commonly known as:  novoLOG Inject 0-20 Units 3 (three) times daily with meals into the skin.   insulin glargine 100 UNIT/ML injection Commonly known as:  LANTUS Inject 0.4 mLs (40 Units total) at bedtime into the skin.   JANUMET 50-500 MG tablet Generic drug:  sitaGLIPtin-metformin Take 1 tablet by mouth daily.   KLOR-CON M10 10 MEQ tablet Generic drug:  potassium chloride Take 1 tablet (10 mEq total) by mouth daily. What changed:  when to take this   levETIRAcetam 500 MG tablet Commonly known as:  KEPPRA Take 1 tablet (500 mg total) 2 (two) times daily by mouth.   loratadine 10 MG tablet Commonly known as:  CLARITIN Take 10 mg by mouth daily as needed for allergies.   losartan 25 MG tablet Commonly known as:  COZAAR Take 1 tablet (25 mg total) daily by mouth. Start taking on:   10/12/2017   meclizine 25 MG tablet Commonly known as:  ANTIVERT Take 25 mg daily as needed by mouth for dizziness or nausea.   metoprolol succinate 25 MG 24 hr tablet Commonly known as:  TOPROL-XL Take 25 mg by mouth daily.   ondansetron 4 MG disintegrating tablet Commonly known as:  ZOFRAN ODT Take 1 tablet (4 mg total) by mouth every 8 (eight) hours as needed for nausea or vomiting. What changed:  additional instructions   polyethylene glycol packet Commonly known as:  MIRALAX / GLYCOLAX Take 17 g daily as needed by mouth for moderate constipation.   rosuvastatin 10 MG tablet Commonly known as:  CRESTOR Take 10 mg by mouth daily.      Follow-up Information    Dixie Dials, MD. Schedule an appointment as soon as possible for a visit in 1 month(s).   Specialty:  Cardiology Contact information: [place] [address] [phone]           Signed: Birdie Riddle 10/11/2017, 1:48 PM

## 2017-10-12 ENCOUNTER — Other Ambulatory Visit: Payer: Self-pay

## 2017-10-12 ENCOUNTER — Inpatient Hospital Stay (HOSPITAL_COMMUNITY): Payer: Medicare HMO | Admitting: Occupational Therapy

## 2017-10-12 ENCOUNTER — Inpatient Hospital Stay (HOSPITAL_COMMUNITY): Payer: Medicare HMO

## 2017-10-12 DIAGNOSIS — M7989 Other specified soft tissue disorders: Secondary | ICD-10-CM

## 2017-10-12 DIAGNOSIS — D72829 Elevated white blood cell count, unspecified: Secondary | ICD-10-CM

## 2017-10-12 DIAGNOSIS — I10 Essential (primary) hypertension: Secondary | ICD-10-CM

## 2017-10-12 DIAGNOSIS — N183 Chronic kidney disease, stage 3 (moderate): Secondary | ICD-10-CM

## 2017-10-12 DIAGNOSIS — E1142 Type 2 diabetes mellitus with diabetic polyneuropathy: Secondary | ICD-10-CM

## 2017-10-12 DIAGNOSIS — E46 Unspecified protein-calorie malnutrition: Secondary | ICD-10-CM

## 2017-10-12 DIAGNOSIS — C3492 Malignant neoplasm of unspecified part of left bronchus or lung: Secondary | ICD-10-CM

## 2017-10-12 LAB — CBC WITH DIFFERENTIAL/PLATELET
Basophils Absolute: 0 10*3/uL (ref 0.0–0.1)
Basophils Relative: 0 %
Eosinophils Absolute: 0.1 10*3/uL (ref 0.0–0.7)
Eosinophils Relative: 1 %
HEMATOCRIT: 37.1 % (ref 36.0–46.0)
HEMOGLOBIN: 12.5 g/dL (ref 12.0–15.0)
LYMPHS ABS: 3.1 10*3/uL (ref 0.7–4.0)
LYMPHS PCT: 26 %
MCH: 29.5 pg (ref 26.0–34.0)
MCHC: 33.7 g/dL (ref 30.0–36.0)
MCV: 87.5 fL (ref 78.0–100.0)
MONO ABS: 1.1 10*3/uL — AB (ref 0.1–1.0)
MONOS PCT: 9 %
NEUTROS ABS: 7.6 10*3/uL (ref 1.7–7.7)
NEUTROS PCT: 64 %
Platelets: 316 10*3/uL (ref 150–400)
RBC: 4.24 MIL/uL (ref 3.87–5.11)
RDW: 12.8 % (ref 11.5–15.5)
WBC: 11.9 10*3/uL — ABNORMAL HIGH (ref 4.0–10.5)

## 2017-10-12 LAB — COMPREHENSIVE METABOLIC PANEL
ALK PHOS: 101 U/L (ref 38–126)
ALT: 17 U/L (ref 14–54)
ANION GAP: 8 (ref 5–15)
AST: 18 U/L (ref 15–41)
Albumin: 2.6 g/dL — ABNORMAL LOW (ref 3.5–5.0)
BILIRUBIN TOTAL: 0.2 mg/dL — AB (ref 0.3–1.2)
BUN: 27 mg/dL — AB (ref 6–20)
CALCIUM: 9.2 mg/dL (ref 8.9–10.3)
CO2: 28 mmol/L (ref 22–32)
Chloride: 100 mmol/L — ABNORMAL LOW (ref 101–111)
Creatinine, Ser: 1.08 mg/dL — ABNORMAL HIGH (ref 0.44–1.00)
GFR calc Af Amer: 59 mL/min — ABNORMAL LOW (ref >60)
GFR, EST NON AFRICAN AMERICAN: 51 mL/min — AB (ref >60)
GLUCOSE: 119 mg/dL — AB (ref 65–99)
POTASSIUM: 4.1 mmol/L (ref 3.5–5.1)
Sodium: 136 mmol/L (ref 135–145)
TOTAL PROTEIN: 6.1 g/dL — AB (ref 6.5–8.1)

## 2017-10-12 LAB — GLUCOSE, CAPILLARY
GLUCOSE-CAPILLARY: 105 mg/dL — AB (ref 65–99)
GLUCOSE-CAPILLARY: 121 mg/dL — AB (ref 65–99)
GLUCOSE-CAPILLARY: 237 mg/dL — AB (ref 65–99)
Glucose-Capillary: 295 mg/dL — ABNORMAL HIGH (ref 65–99)

## 2017-10-12 MED ORDER — PRO-STAT SUGAR FREE PO LIQD
30.0000 mL | Freq: Two times a day (BID) | ORAL | Status: DC
  Administered 2017-10-12 – 2017-10-23 (×19): 30 mL via ORAL
  Filled 2017-10-12 (×22): qty 30

## 2017-10-12 NOTE — Care Management (Signed)
[facility] Individual Statement of Services  Patient Name:  Labrisha F Lord  Date:  10/12/2017  Welcome to the [facility].  Our goal is to provide you with an individualized program based on your diagnosis and situation, designed to meet your specific needs.  With this comprehensive rehabilitation program, you will be expected to participate in at least 3 hours of rehabilitation therapies Monday-Friday, with modified therapy programming on the weekends.  Your rehabilitation program will include the following services:  Physical Therapy (PT), Occupational Therapy (OT), 24 hour per day rehabilitation nursing, Therapeutic Recreaction (TR), Neuropsychology, Case Management (Social Worker), Rehabilitation Medicine, Nutrition Services and Pharmacy Services  Weekly team conferences will be held on Wednesdays to discuss your progress.  Your Social Worker will talk with you frequently to get your input and to update you on team discussions.  Team conferences with you and your family in attendance Zayla also be held.  Expected length of stay: 2-3 weeks  Overall anticipated outcome: supervision  Depending on your progress and recovery, your program [place] change. Your Social Worker will coordinate services and will keep you informed of any changes. Your Social Worker's name and contact numbers are listed  below.  The following services Jarika also be recommended but are not provided by the [facility] will be made to provide these services after discharge if needed.  Arrangements include referral to agencies that provide these services.  Your insurance has been verified to be:  [employer] Your primary doctor is:  Dr. Doylene Canard  Pertinent information will be shared with your doctor and your insurance company.  Social Worker:  [place], [place] or ([phone]   Information discussed with and copy given to patient by: Lennart Pall, 10/12/2017, 11:46 AM

## 2017-10-12 NOTE — Progress Notes (Signed)
*  PRELIMINARY RESULTS* Vascular Ultrasound Bilateral lower extremity venous duplex has been completed.  Preliminary findings: No evidence of deep vein thrombosis or baker's cysts bilaterally.   Everrett Coombe 10/12/2017, 2:40 PM

## 2017-10-12 NOTE — Evaluation (Signed)
Occupational Therapy Assessment and Plan  Patient Details  Name: [name] MRN: [id number] Date of Birth: 03/07/1946  OT Diagnosis: hemiplegia affecting dominant side and muscle weakness (generalized) Rehab Potential: Rehab Potential (ACUTE ONLY): Good ELOS: 17-21 days   Today's Date: 10/12/2017 OT Individual Time: 2423-5361 and 1300-1330 OT Individual Time Calculation (min): 77 min and 30 min      Problem List:  Patient Active Problem List   Diagnosis Date Noted  . Hypoalbuminemia due to protein-calorie malnutrition ([facility])   . Leukocytosis   . Metastatic lung cancer (metastasis from lung to other site) ([facility]) 10/11/2017  . Brain metastasis ([facility])   . Seizure prophylaxis   . Hyperlipidemia   . Slow transit constipation   . Hyponatremia   . Stage 3 chronic kidney disease ([facility])   . Type 2 diabetes mellitus with peripheral neuropathy ([facility])   . Benign essential HTN   . Tobacco abuse   . Secondary seizure disorder ([facility]) 10/02/2017  . GERD (gastroesophageal reflux disease) 08/16/2017  . Lung mass 08/16/2017  . Shortness of breath 07/05/2017    Class: Acute  . Vaginal odor 02/10/2017  . Dysuria 02/10/2017  . Osteoarthritis of spine with radiculopathy, cervical region 12/25/2016  . COPD (chronic obstructive pulmonary disease) ([facility]) 10/11/2015  . Orthostatic hypotension 06/18/2013  . Numbness and tingling in right hand 11/10/2011    Class: Acute  . Hypertension 11/10/2011  . Cervical disc disease 11/10/2011    Class: Chronic  . DM II (diabetes mellitus, type II), controlled ([facility]) 11/10/2011    Class: Chronic    Past Medical History:  Past Medical History:  Diagnosis Date  . Anxiety   . Arthritis   . Cancer ([facility])   . Chronic back pain greater than 3 months duration   . Chronic neck pain   . COPD (chronic obstructive pulmonary disease) ([facility])   . Cough   . Diabetes mellitus   . Dyspnea   . GERD (gastroesophageal reflux disease)   . H/O hiatal hernia   . Headache(784.0)    . Hemorrhoids   . Herniated disc   . History of kidney stones   . Hx of echocardiogram 2010   normal EF  . Hypertension   . Palpitations   . Shortness of breath on exertion 11/10/11   "sometimes"  . Vertigo   . Vertigo    Past Surgical History:  Past Surgical History:  Procedure Laterality Date  . ABDOMINAL HYSTERECTOMY  1997?   "partial"  . ANTERIOR CERVICAL DECOMP/DISCECTOMY FUSION N/A 12/25/2016   Procedure: ANTERIOR CERVICAL DECOMPRESSION/DISCECTOMY FUSION CERVICAL FOUR CERVICAL FIVE;  Surgeon: Ashok Pall, MD;  Location: [facility];  Service: Neurosurgery;  Laterality: N/A;  . CHOLECYSTECTOMY  ~ 2008  . COLONOSCOPY    . EYE SURGERY     cateracts  . FRACTURE SURGERY  2004?   right shoulder  . KNEE ARTHROSCOPY  2004   right  . KNEE SURGERY     right  . neck fusion    . SHOULDER OPEN ROTATOR CUFF REPAIR  07/20/2011   left  . TONSILLECTOMY      Assessment & Plan Clinical Impression: Patient is a 71 y.o. year old right handed femalewith history of diabetes mellitus, CKD stage III, COPD with tobacco abuse. Per chart review patient lives with spouse.Spouse works during the day but daughters can assist.One level home to steps to entry.Patient was independent through October with ADLs and driving. Presented 10/02/2017 with recent diagnosis of right upper lung nodule/squamous cell  lung cancer with metastasis in October 2018 followed by Dr. Earlie Server. Patient had been scheduled for a VATS right upper lobectomy. Nodule with metastatic lesion in left cerebral hemisphere and noted progressive weakness of right her right side as well as increasing back pain . MRI 09/23/2017 of the brain reviewed, showing left frontal lesion.  Per report, 10 mm left posterior frontal mass/with moderate vasogenic edema. Decadron was added to decreased peri-infarct swelling. Underwent CT-guided core needle biopsy of right upper lobe nodule 09/24/2017. Intermittent bouts of altered mental status EEG negative for  seizure. Maintained on Keppra for seizure prophylaxis. Oncology to follow-up after final pathology reports on plan of care. Latest follow-up CT again shows superior left frontal lobe hemorrhagic metastasis with vasogenic edema 10/02/2017. Spiked a low-grade temperature and rocephin added.  Blood cultures negative and antibiotic later discontinued. Physical and occupational therapy evaluations completed. Patient with ongoing increased weakness of lower extremities limiting overall mobility and as of 10/06/2017 recommendations made for physical medicine rehabilitation consult.   Patient transferred to CIR on 10/11/2017 .    Patient currently requires mod with basic self-care skills secondary to muscle weakness, decreased coordination and decreased motor planning and decreased standing balance, decreased postural control and decreased balance strategies.  Prior to hospitalization, patient could complete ADLs and iADLs with independent .  Patient will benefit from skilled intervention to decrease level of assist with basic self-care skills and increase independence with basic self-care skills prior to discharge home with care partner.  Anticipate patient will require 24 hour supervision and follow up to be determined..  OT - End of Session Activity Tolerance: Tolerates 30+ min activity with multiple rests Endurance Deficit: Yes Endurance Deficit Description: limited tolerance to standing activities due to fatigue OT Assessment Rehab Potential (ACUTE ONLY): Good OT Patient demonstrates impairments in the following area(s): Balance;Motor;Endurance OT Basic ADL's Functional Problem(s): Grooming;Bathing;Dressing;Toileting OT Transfers Functional Problem(s): Toilet;Tub/Shower OT Additional Impairment(s): Fuctional Use of Upper Extremity OT Plan OT Intensity: Minimum of 1-2 x/day, 45 to 90 minutes OT Frequency: 5 out of 7 days OT Duration/Estimated Length of Stay: 17-21 days OT Treatment/Interventions:  Balance/vestibular training;Discharge planning;Self Care/advanced ADL retraining;Therapeutic Activities;UE/LE Coordination activities;Cognitive remediation/compensation;Disease mangement/prevention;Functional mobility training;Patient/family education;Therapeutic Exercise;Community reintegration;UE/LE Strength taining/ROM;Wheelchair propulsion/positioning;DME/adaptive equipment instruction OT Self Feeding Anticipated Outcome(s): independent OT Basic Self-Care Anticipated Outcome(s): supervision OT Toileting Anticipated Outcome(s): supervisoin OT Bathroom Transfers Anticipated Outcome(s): supervision OT Recommendation Patient destination: Home Follow Up Recommendations: 24 hour supervision/assistance;Other (comment)(TBD) Equipment Recommended: To be determined   Skilled Therapeutic Intervention Visit 1: OT eval completed with explanation of CIR process, OT purpose, and overall POC. OT treatment completed with focus on ADL retraining, functional mobility transfers. Pt received sitting on toilet with RN upon entering room, [place] for stand pivot to w/c using grab bars. Pt completed bathing/dressing ADLs seated in w/c at sink, requires assist to wash back and buttocks region. Pt initially required [place] for sit<>stand, progressed to MinA at sink, requires multimodal cues for RLE placement prior to standing and cues for hand placement. Pt noted to have increased hyperextension in RLE and external rotation of R foot during prolonged periods of standing. Pt requires ModA for LB dressing, MinA for UB dressing. Pt with reports of needing to toilet at end of session, ModA for stand pivot w/c<>toilet using grab bars and MaxA for clothing management/peri-care in standing after BM. Pt left seated in w/c, call bell and needs within reach.   Visit 2: Pt presented in recliner, reporting increased fatigue this session though agreeable to OT  tx session. Pt completed stand pivot recliner>w/c at RW level, requires modA for  sit<>stand, ModA for transfer as Pt with increased difficulty advancing RLE during transfer. Pt required verbal questioning cues prior to standing for RLE placement, requires min verbal safety cues as Pt attempting to stand to adjust pants after transfer to w/c without therapist assist. Pt engaged in seated table top activity with focus on RUE [place], reaching across midline and manipulating 1-2 pieces in hand at a time. Pt with requests to return to supine end of session, completing stand pivot with ModA at RW level, MinGuard for return to supine. Pt left supine in bed, hand off to SLP arriving to begin session, needs within reach.   OT Evaluation Precautions/Restrictions  Precautions Precautions: Fall Restrictions Weight Bearing Restrictions: No General Chart Reviewed: Yes Vital Signs   Pain Pain Assessment Pain Assessment: No/denies pain Home Living/Prior Functioning Home Living Family/patient expects to be discharged to:: Private residence Living Arrangements: Spouse/significant other Available Help at Discharge: Available 24 hours/day, Family Type of Home: House Home Access: Stairs to enter Technical brewer of Steps: 2 Entrance Stairs-Rails: None Home Layout: One level Bathroom Shower/Tub: Tub/shower unit, Architectural technologist: Standard Bathroom Accessibility: Yes Prior Function Level of Independence: Independent with transfers, Independent with homemaking with ambulation, Independent with gait, Independent with basic ADLs  Able to Take Stairs?: Yes Driving: Yes Vocation: Retired Leisure: Hobbies-yes (Comment) Comments: computer, card and board games ADL ADL ADL Comments: please see functional navigator  Vision Baseline Vision/History: Wears glasses Wears Glasses: At all times Patient Visual Report: Blurring of vision;Other (comment)(watery and sometimes sees spots (L eye) ) Vision Assessment?: Vision impaired- to be further tested in functional context Eye  Alignment: Within Functional Limits Tracking/Visual Pursuits: Decreased smoothness of horizontal tracking;Decreased smoothness of vertical tracking;Decreased smoothness of eye movement to RIGHT superior field;Decreased smoothness of eye movement to RIGHT inferior field Additional Comments: noted some R inattention    Cognition Overall Cognitive Status: Impaired/Different from baseline Arousal/Alertness: Awake/alert Orientation Level: Person;Place;Situation Person: Oriented Place: Oriented Situation: Oriented Year: 2018 Month: November Day of Week: Correct Memory: Impaired Memory Impairment: Decreased recall of new information;Decreased short term memory Decreased Short Term Memory: Verbal complex;Functional complex Immediate Memory Recall: Sock;Blue;Bed Memory Recall: Blue Memory Recall Blue: With Cue Awareness: Impaired Awareness Impairment: Emergent impairment Safety/Judgment: Impaired Sensation Sensation Light Touch: Appears Intact Proprioception: Appears Intact Coordination Gross Motor Movements are Fluid and Coordinated: No Fine Motor Movements are Fluid and Coordinated: No Coordination and Movement Description: slower fine motor movements in R hand; difficulty coordinating RUE to perform functional tasks  Finger Nose Finger Test: slowed movements  Motor  Motor Motor: Hemiplegia Motor - Skilled Clinical Observations: weakness on R side Mobility  Bed Mobility Bed Mobility: Rolling Right;Rolling Left;Sit to Sidelying Left;Left Sidelying to Sit Rolling Right: 5: Supervision;With rail Rolling Right Details (indicate cue type and reason): increased time Rolling Left: 5: Supervision;With rail Rolling Left Details (indicate cue type and reason): increased time Left Sidelying to Sit: 4: Min assist;With rails;HOB flat Left Sidelying to Sit Details: Verbal cues for technique;Manual facilitation for placement Left Sidelying to Sit Details (indicate cue type and reason): assist  for R LE off bed Sit to Sidelying Left: 4: Min assist;With rail;HOB flat Sit to Sidelying Left Details: Verbal cues for technique Sit to Sidelying Left Details (indicate cue type and reason): assist for R LE onto bed Transfers Transfers: Sit to Stand;Stand to Sit Sit to Stand: 3: Mod assist Sit to Stand Details: Verbal cues for  technique;Verbal cues for precautions/safety;Verbal cues for safe use of DME/AE;Manual facilitation for weight shifting;Manual facilitation for placement Sit to Stand Details (indicate cue type and reason): cues to scoot to edge of bed and for hand placement; assist for forward weight shift Stand to Sit: 3: Mod assist Stand to Sit Details (indicate cue type and reason): Verbal cues for safe use of DME/AE;Verbal cues for precautions/safety;Verbal cues for technique;Manual facilitation for weight shifting Stand to Sit Details: cues for hand placement to control descent   Trunk/Postural Assessment  Cervical Assessment Cervical Assessment: Within Functional Limits Thoracic Assessment Thoracic Assessment: Within Functional Limits Lumbar Assessment Lumbar Assessment: Within Functional Limits Postural Control Postural Control: Within Functional Limits  Balance Balance Balance Assessed: Yes Static Sitting Balance Static Sitting - Balance Support: No upper extremity supported Static Sitting - Level of Assistance: 5: Stand by assistance Dynamic Sitting Balance Dynamic Sitting - Balance Support: Feet supported;During functional activity Dynamic Sitting - Level of Assistance: 5: Stand by assistance Dynamic Sitting - Balance Activities: Forward lean/weight shifting;Lateral lean/weight shifting Sitting balance - Comments: leans to R/L and forward no LOB Static Standing Balance Static Standing - Balance Support: Bilateral upper extremity supported;During functional activity Dynamic Standing Balance Dynamic Standing - Balance Support: Bilateral upper extremity  supported;During functional activity Dynamic Standing - Level of Assistance: 3: Mod assist Extremity/Trunk Assessment RUE Assessment RUE Assessment: Exceptions to [facility] RUE Strength RUE Overall Strength Comments: shoulder flexion grossly 3+/5; elbow flexion grossly 4/5 LUE Assessment LUE Assessment: Within Functional Limits   See Function Navigator for Current Functional Status.   Refer to Care Plan for Long Term Goals  Recommendations for other services: Therapeutic Recreation  Other TBD   Discharge Criteria: Patient will be discharged from OT if patient refuses treatment 3 consecutive times without medical reason, if treatment goals not met, if there is a change in medical status, if patient makes no progress towards goals or if patient is discharged from hospital.  The above assessment, treatment plan, treatment alternatives and goals were discussed and mutually agreed upon: by patient  Raymondo Band 10/12/2017, 2:08 PM

## 2017-10-12 NOTE — Progress Notes (Signed)
Social Work  Social Work Assessment and Plan  Patient Details  Name: [name] MRN: [id number] Date of Birth: [birth year]  Today's Date: 10/12/2017  Problem List:  Patient Active Problem List   Diagnosis Date Noted  . Hypoalbuminemia due to protein-calorie malnutrition ([place])   . Leukocytosis   . Metastatic lung cancer (metastasis from lung to other site) ([place]) 10/11/2017  . Brain metastasis ([place])   . Seizure prophylaxis   . Hyperlipidemia   . Slow transit constipation   . Hyponatremia   . Stage 3 chronic kidney disease ([place])   . Type 2 diabetes mellitus with peripheral neuropathy (H[facility])   . Benign essential HTN   . Tobacco abuse   . Secondary seizure disorder ([place]) 10/02/2017  . GERD (gastroesophageal reflux disease) 08/16/2017  . Lung mass 08/16/2017  . Shortness of breath 07/05/2017    Class: Acute  . Vaginal odor 02/10/2017  . Dysuria 02/10/2017  . Osteoarthritis of spine with radiculopathy, cervical region 12/25/2016  . COPD (chronic obstructive pulmonary disease) ([place]) 10/11/2015  . Orthostatic hypotension 06/18/2013  . Numbness and tingling in right hand 11/10/2011    Class: Acute  . Hypertension 11/10/2011  . Cervical disc disease 11/10/2011    Class: Chronic  . DM II (diabetes mellitus, type II), controlled ([place]) 11/10/2011    Class: Chronic   Past Medical History:  Past Medical History:  Diagnosis Date  . Anxiety   . Arthritis   . Cancer ([place])   . Chronic back pain greater than 3 months duration   . Chronic neck pain   . COPD (chronic obstructive pulmonary disease) ([place])   . Cough   . Diabetes mellitus   . Dyspnea   . GERD (gastroesophageal reflux disease)   . H/O hiatal hernia   . Headache(784.0)   . Hemorrhoids   . Herniated disc   . History of kidney stones   . Hx of echocardiogram 2010   normal EF  . Hypertension   . Palpitations   . Shortness of breath on exertion 11/10/11   "sometimes"  . Vertigo   . Vertigo    Past Surgical History:   Past Surgical History:  Procedure Laterality Date  . ABDOMINAL HYSTERECTOMY  1997?   "partial"  . ANTERIOR CERVICAL DECOMP/DISCECTOMY FUSION N/A 12/25/2016   Procedure: ANTERIOR CERVICAL DECOMPRESSION/DISCECTOMY FUSION CERVICAL FOUR CERVICAL FIVE;  Surgeon: Ashok Pall, MD;  Location: [facility];  Service: Neurosurgery;  Laterality: N/A;  . CHOLECYSTECTOMY  ~ 2008  . COLONOSCOPY    . EYE SURGERY     cateracts  . FRACTURE SURGERY  2004?   right shoulder  . KNEE ARTHROSCOPY  2004   right  . KNEE SURGERY     right  . neck fusion    . SHOULDER OPEN ROTATOR CUFF REPAIR  07/20/2011   left  . TONSILLECTOMY     Social History:  reports that she quit smoking about 3 months ago. Her smoking use included cigarettes. She has a 7.50 pack-year smoking history. she has never used smokeless tobacco. She reports that she does not drink alcohol or use drugs.  Family / Support Systems Marital Status: Married Patient Roles: Spouse, Parent Spouse/Significant Other: Keyna Blizard @ [phone]) [phone] Children: daughter, Pierce Crane @ [phone];  daughter, Lemmie Evens - both daughters are local Other Supports: brother, Marcelle Overlie (local) @ (H) [phone] Anticipated Caregiver: Spouse and daughters Ability/Limitations of Caregiver: Spouse works but daughter and other family can assist  Caregiver Availability: 24/7 Family  Dynamics: Pt describes all family as very supportive and notes that her daughters, "got me covered..." with assist upon d/c.  Social History Preferred language: English Religion: Holiness Cultural Background: NA Read: Yes Write: Yes Employment Status: Retired Freight forwarder Issues: None Guardian/Conservator: None- per MD, pt is capable of making decisions on her own behalf.   Abuse/Neglect Abuse/Neglect Assessment Can Be Completed: Yes Physical Abuse: Denies Verbal Abuse: Denies Sexual Abuse: Denies Exploitation of patient/patient's resources:  Denies Self-Neglect: Denies  Emotional Status Pt's affect, behavior adn adjustment status: Pt very pleasant and fully oriented.  Able to complete assessment interview without any difficulty.  She denies any significant emotional distress regarding her newly diagnosed cancer with mets.  She notes, "I like to stay positive.  I have a strong faith."  Will monitor and refer for neuropsychology consult as indicated. Recent Psychosocial Issues: None Pyschiatric History: None Substance Abuse History: None  Patient / Family Perceptions, Expectations & Goals Pt/Family understanding of illness & functional limitations: Pt with good understanding of her diagnosis and current functional limitations due to brain legions/ need for CIR. Premorbid pt/family roles/activities: Pt was completely independent at home and in the community. Anticipated changes in roles/activities/participation: Dependent on goals - family will likely need to provide caregiver support. Pt/family expectations/goals: "I just want to get this right side moving again."  [employer]: None Premorbid Home Care/DME Agencies: None Transportation available at discharge: yes Resource referrals recommended: Neuropsychology, Support group (specify)  Discharge Planning Living Arrangements: Spouse/significant other Support Systems: Spouse/significant other, Children, Other relatives, Water engineer, Social worker community Type of Residence: Private residence Insurance Resources: Medicare(Aetna Medicare) Financial Resources: Radio broadcast assistant Screen Referred: No Living Expenses: Own Money Management: Spouse Does the patient have any problems obtaining your medications?: No Home Management: Pt and spouse Patient/Family Preliminary Plans: Pt to d/c to her home with spouse, daughters and brother providing any needed assistance. Social Work Anticipated Follow Up Needs: HH/OP Expected length of stay: 3  weeks  Clinical Impression Very pleasant woman here following functional decline due to metastatic lung ca to brain.  Motivated and optimistic about her functional recovery and denies any significant emotional distress.  Relies on strong faith and family support.  Family prepared to provide 24/7 support upon d/c.  Will follow for support and d/c planning needs.  Satoria Dunlop 10/12/2017, 3:02 PM

## 2017-10-12 NOTE — Evaluation (Signed)
Physical Therapy Assessment and Plan  Patient Details  Name: Afreen F Ribaudo MRN: [id number] Date of Birth: 07-04-1946  PT Diagnosis: Abnormality of gait, Hemiparesis dominant and Muscle weakness Rehab Potential: Excellent ELOS: 2 weeks   Today's Date: 10/12/2017 PT Individual Time:Session1: 8466-5993; Arthor Captain: [id number] PT Individual Time Calculation (min): 45 min  & 35 min  Problem List:  Patient Active Problem List   Diagnosis Date Noted  . Hypoalbuminemia due to protein-calorie malnutrition ([place])   . Leukocytosis   . Metastatic lung cancer (metastasis from lung to other site) ([place]) 10/11/2017  . Brain metastasis ([place])   . Seizure prophylaxis   . Hyperlipidemia   . Slow transit constipation   . Hyponatremia   . Stage 3 chronic kidney disease ([place])   . Type 2 diabetes mellitus with peripheral neuropathy ([facility])   . Benign essential HTN   . Tobacco abuse   . Secondary seizure disorder ([place]) 10/02/2017  . GERD (gastroesophageal reflux disease) 08/16/2017  . Lung mass 08/16/2017  . Shortness of breath 07/05/2017    Class: Acute  . Vaginal odor 02/10/2017  . Dysuria 02/10/2017  . Osteoarthritis of spine with radiculopathy, cervical region 12/25/2016  . COPD (chronic obstructive pulmonary disease) ([place]) 10/11/2015  . Orthostatic hypotension 06/18/2013  . Numbness and tingling in right hand 11/10/2011    Class: Acute  . Hypertension 11/10/2011  . Cervical disc disease 11/10/2011    Class: Chronic  . DM II (diabetes mellitus, type II), controlled ([place]) 11/10/2011    Class: Chronic    Past Medical History:  Past Medical History:  Diagnosis Date  . Anxiety   . Arthritis   . Cancer ([place])   . Chronic back pain greater than 3 months duration   . Chronic neck pain   . COPD (chronic obstructive pulmonary disease) ([place])   . Cough   . Diabetes mellitus   . Dyspnea   . GERD (gastroesophageal reflux disease)   . H/O hiatal hernia   . Headache(784.0)   . Hemorrhoids   .  Herniated disc   . History of kidney stones   . Hx of echocardiogram 2010   normal EF  . Hypertension   . Palpitations   . Shortness of breath on exertion 11/10/11   "sometimes"  . Vertigo   . Vertigo    Past Surgical History:  Past Surgical History:  Procedure Laterality Date  . ABDOMINAL HYSTERECTOMY  1997?   "partial"  . ANTERIOR CERVICAL DECOMP/DISCECTOMY FUSION N/A 12/25/2016   Procedure: ANTERIOR CERVICAL DECOMPRESSION/DISCECTOMY FUSION CERVICAL FOUR CERVICAL FIVE;  Surgeon: Ashok Pall, MD;  Location: [facility];  Service: Neurosurgery;  Laterality: N/A;  . CHOLECYSTECTOMY  ~ 2008  . COLONOSCOPY    . EYE SURGERY     cateracts  . FRACTURE SURGERY  2004?   right shoulder  . KNEE ARTHROSCOPY  2004   right  . KNEE SURGERY     right  . neck fusion    . SHOULDER OPEN ROTATOR CUFF REPAIR  07/20/2011   left  . TONSILLECTOMY      Assessment & Plan Clinical Impression:  Patient is a 71 y.o. year old right handed femalewith history of diabetes mellitus, CKD stage III, COPD with tobacco abuse. Per chart review patient lives with spouse.Spouse works during the day but daughters can assist.One level home two steps to entry.Patient was independent through October with ADLs and driving. Presented 10/02/2017 with recent diagnosis of right upper lung nodule/squamous cell lung cancer with metastasis in October  2018 followed by Dr. Earlie Server. Patient had been scheduled for a VATS right upper lobectomy. Nodule with metastatic lesion in left cerebral hemisphere and noted progressive weakness of right her right side as well as increasing back pain . MRI 09/23/2017 of the brain reviewed, showing left frontal lesion. Per report, 10 mm left posterior frontal mass/with moderate vasogenic edema. Decadron was added to decreased peri-infarct swelling. Underwent CT-guided core needle biopsy of right upper lobe nodule 09/24/2017. Intermittent bouts of altered mental status EEG negative for seizure. Maintained  on Keppra for seizure prophylaxis. Oncology to follow-up after final pathology reports on plan of care. Latest follow-up CT again shows superior left frontal lobe hemorrhagic metastasis with vasogenic edema 10/02/2017. Spiked a low-grade temperature and rocephin added. Blood cultures negative and antibiotic later discontinued. Physical and occupational therapy evaluations completed.  Patient transferred to CIR on 10/11/2017 .    Patient currently requires mod with mobility secondary to abnormal tone, unbalanced muscle activation and decreased coordination and decreased attention to right.  Prior to hospitalization, patient was independent  with mobility and lived with   in a House home.  Home access is 2Stairs to enter.  Patient will benefit from skilled PT intervention to maximize safe functional mobility and minimize fall risk for planned discharge home with 24 hour supervision.  Anticipate patient will benefit from follow up [place] at discharge.  PT - End of Session Activity Tolerance: Tolerates 30+ min activity with multiple rests Endurance Deficit: Yes Endurance Deficit Description: limited tolerance to standing activities due to fatigue PT Assessment Rehab Potential (ACUTE/IP ONLY): Excellent PT Patient demonstrates impairments in the following area(s): Balance;Safety;Endurance;Motor;Perception PT Transfers Functional Problem(s): Bed Mobility;Bed to Chair;Car;Furniture PT Locomotion Functional Problem(s): Ambulation;Stairs;Wheelchair Mobility PT Plan PT Intensity: Minimum of 1-2 x/day ,45 to 90 minutes PT Frequency: 5 out of 7 days PT Duration Estimated Length of Stay: 17-21 days PT Treatment/Interventions: Ambulation/gait training;Stair training;UE/LE Strength taining/ROM;Wheelchair propulsion/positioning;DME/adaptive Chief Technology Officer;Therapeutic Activities;UE/LE Coordination activities;Functional mobility training;Patient/family education;Therapeutic  Exercise PT Transfers Anticipated Outcome(s): S PT Locomotion Anticipated Outcome(s): Min A stairs, S gait PT Recommendation Follow Up Recommendations: Home health PT Patient destination: Home Equipment Recommended: Rolling walker with 5" wheels  Skilled Therapeutic Intervention Session1: Patient in wheelchair in room discussed prior level of function and performed LE and UE strength and coordination/sensation testing. Patient squat pivot to bed max A due to R leg caught in wheel on wheelchair and needing assist to lift and lower.  Patient participated in mobility assessment in bed and was min a overall due to R side awareness/inattention.  She performed sit to stand min/mod A and ambulated with mod A x 15' w/ RW and increased time with R foot sticking due to decreased strength, awareness and L lateral weight shift.  Patient left in w/c with all needs in reach end of session.   [place]: Patient in supine following testing for DVT.  She performed supine to sit with rail and increased time with S.  Stand pivot with RW to w/c mod A.  Patient propelled w/c with min A x 100'.  She performed sit to stand mod A at railings on stairs with cues and negotiated 4 3" steps with bilateral rails and max A for R foot lifting and lowering.  She was fatigued and assisted to room.  Requested to toilet so assisted to bathroom in w/c mod A transfer to help move R foot and then pt performed hygiene with cues and stood with mod A and cues for grabbar, pivot with  max A due to difficutly progressing R LE to pivot.  Assisted to supine and left with bed alarm and all needs in reach.   PT Evaluation Precautions/Restrictions Precautions Precautions: Fall Restrictions Weight Bearing Restrictions: No General   Vital SignsOxygen Therapy SpO2: 97 % O2 Device: Not Delivered Pain Pain Assessment Pain Assessment: No/denies pain Home Living/Prior Functioning Home Living Available Help at Discharge: Available 24  hours/day;Family Type of Home: House Home Access: Stairs to enter [employer] of Steps: 2 Entrance Stairs-Rails: None Home Layout: One level Bathroom Shower/Tub: Product/process development scientist: Standard Bathroom Accessibility: Yes Prior Function Level of Independence: Independent with transfers;Independent with homemaking with ambulation;Independent with gait;Independent with basic ADLs  Able to Take Stairs?: Yes Driving: Yes Vocation: Retired Leisure: Hobbies-yes (Comment) Comments: computer, card and board games Vision/Perception     Cognition Overall Cognitive Status: Impaired/Different from baseline Arousal/Alertness: Awake/alert Orientation Level: Oriented X4 Memory: Impaired Memory Impairment: Decreased recall of new information;Decreased short term memory Decreased Short Term Memory: Verbal complex;Functional complex Awareness: Impaired Sensation Sensation Light Touch: Appears Intact Proprioception: Appears Intact Coordination Gross Motor Movements are Fluid and Coordinated: No Fine Motor Movements are Fluid and Coordinated: No Coordination and Movement Description: slower fine motor movements in R hand; difficulty coordinating RUE to perform functional tasks  Heel Shin Test: slowed and weaker on R Motor  Motor Motor: Hemiplegia Motor - Skilled Clinical Observations: weakness on R side  Mobility Bed Mobility Bed Mobility: Rolling Right;Rolling Left;Sit to Sidelying Left;Left Sidelying to Sit Rolling Right: 5: Supervision;With rail Rolling Right Details (indicate cue type and reason): increased time Rolling Left: 5: Supervision;With rail Rolling Left Details (indicate cue type and reason): increased time Left Sidelying to Sit: 4: Min assist;With rails;HOB flat Left Sidelying to Sit Details: Verbal cues for technique;Manual facilitation for placement Left Sidelying to Sit Details (indicate cue type and reason): assist for R LE off bed Sit to  Sidelying Left: 4: Min assist;With rail;HOB flat Sit to Sidelying Left Details: Verbal cues for technique Sit to Sidelying Left Details (indicate cue type and reason): assist for R LE onto bed Transfers Transfers: Yes Sit to Stand: 3: Mod assist Sit to Stand Details: Verbal cues for technique;Verbal cues for precautions/safety;Verbal cues for safe use of DME/AE;Manual facilitation for weight shifting;Manual facilitation for placement Sit to Stand Details (indicate cue type and reason): cues to scoot to edge of bed and for hand placement; assist for forward weight shift Stand to Sit: 3: Mod assist Stand to Sit Details (indicate cue type and reason): Verbal cues for safe use of DME/AE;Verbal cues for precautions/safety;Verbal cues for technique;Manual facilitation for weight shifting Stand to Sit Details: cues for hand placement to control descent  Squat Pivot Transfers: 2: Max Risk manager Details: Verbal cues for technique;Verbal cues for safe use of DME/AE Squat Pivot Transfer Details (indicate cue type and reason): assist for lifting and lowering  Stand Pivot Transfer: Max A with RW assist to progress R LE Locomotion  Ambulation Ambulation: Yes Ambulation/Gait Assistance: 3: Mod assist Ambulation Distance (Feet): 15 Feet Assistive device: Rolling walker Ambulation/Gait Assistance Details: Tactile cues for posture;Verbal cues for sequencing;Verbal cues for safe use of DME/AE;Manual facilitation for weight shifting Ambulation/Gait Assistance Details: cues for sequencing, posture and facilitating weight shift L to allow initiation of swing on R due to sticking Gait Gait: Yes Gait Pattern: Impaired Gait Pattern: Step-to pattern;Decreased step length - left;Decreased stance time - right;Decreased dorsiflexion - right;Decreased weight shift to left;Narrow base of support;Trunk flexed Stairs /  Additional Locomotion Stairs: Yes Stairs Assistance: 2: Max Industrial/product designer Assistance  Details: Verbal cues for sequencing;Verbal cues for technique;Verbal cues for precautions/safety Stairs Assistance Details (indicate cue type and reason): assist for safety and lifting/lowering Stair Management Technique: Step to pattern Number of Stairs: 4 Height of Stairs: 3 Wheelchair Mobility Wheelchair Mobility: Yes Wheelchair Assistance: 4: Advertising account executive Details: Verbal cues for technique;Verbal cues for precautions/safety;Verbal cues for safe use of DME/AE Wheelchair Propulsion: Left lower extremity;Left upper extremity Wheelchair Parts Management: Needs assistance Distance: 100  Trunk/Postural Assessment  Cervical Assessment Cervical Assessment: Within Functional Limits Thoracic Assessment Thoracic Assessment: Within Functional Limits Lumbar Assessment Lumbar Assessment: Within Functional Limits Postural Control Postural Control: Within Functional Limits  Balance Balance Balance Assessed: Yes Static Sitting Balance Static Sitting - Balance Support: No upper extremity supported Static Sitting - Level of Assistance: 5: Stand by assistance Dynamic Sitting Balance Dynamic Sitting - Balance Support: Feet supported;During functional activity Dynamic Sitting - Level of Assistance: 5: Stand by assistance Dynamic Sitting - Balance Activities: Forward lean/weight shifting;Lateral lean/weight shifting Sitting balance - Comments: leans to R/L and forward no LOB Static Standing Balance Static Standing - Balance Support: Bilateral upper extremity supported;During functional activity Dynamic Standing Balance Dynamic Standing - Balance Support: Bilateral upper extremity supported;During functional activity Dynamic Standing - Level of Assistance: 3: Mod assist/Max assist Extremity Assessment      RLE Assessment RLE Assessment: Exceptions to [facility] RLE Strength RLE Overall Strength: Deficits RLE Overall Strength Comments: hip flexion 4-/5, knee extension 4-/5, ankle  DF 2+/5, knee flexion 2-/5 RLE Tone RLE Tone Comments: decreased tone compared to L LLE Assessment LLE Assessment: Within Functional Limits   See Function Navigator for Current Functional Status.   Refer to Care Plan for Long Term Goals  Recommendations for other services: None   Discharge Criteria: Patient will be discharged from PT if patient refuses treatment 3 consecutive times without medical reason, if treatment goals not met, if there is a change in medical status, if patient makes no progress towards goals or if patient is discharged from hospital.  The above assessment, treatment plan, treatment alternatives and goals were discussed and mutually agreed upon: by patient  Reginia Naas 10/12/2017, 12:32 PM

## 2017-10-12 NOTE — Progress Notes (Signed)
Patient information reviewed and entered into eRehab system by Alverto Shedd, RN, CRRN, PPS Coordinator.  Information including medical coding and functional independence measure will be reviewed and updated through discharge.     Per nursing patient was given "Data Collection Information Summary for Patients in Inpatient Rehabilitation Facilities with attached "Privacy Act Statement-Health Care Records" upon admission.  

## 2017-10-12 NOTE — Progress Notes (Signed)
Physical Medicine and Rehabilitation Consult Reason for Consult: Creased functional mobility Referring Physician: Dr. Doylene Canard   HPI: [name] is a 71 y.o. right handed female with history of diabetes mellitus, COPD with tobacco abuse. Per chart review patient lives with spouse. Spouse works during the day but daughters can assist. One level home to steps to entry. Patient was independent through October with ADLs and driving. 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 Earlie Server. Patient had been scheduled for a VATS right upper lobectomy. Nodule with metastatic lesion in left cerebral hemisphere noted new onset of right upper extremity becoming weak. MRI 09/23/2017 the brain showed a 10 mm left posterior frontal mass/with moderate vasogenic edema. Underwent CT-guided core needle biopsy of right upper lobe nodule 09/24/2017. Intermittent bouts of altered mental status EEG negative for seizure. Oncology follow-up after final pathology reports on plan of care. Latest follow-up CT again shows superior left frontal lobe hemorrhagic metastasis with vasogenic edema 10/02/2017. Physical therapy evaluation completed. Patient with ongoing increased weakness of lower extremities limiting overall mobility and as of 10/06/2017 recommendations made for physical medicine rehabilitation consult.   Review of Systems  Constitutional: Negative for chills and fever.  HENT: Negative for hearing loss.   Eyes: Negative for blurred vision.  Respiratory: Positive for cough and shortness of breath.   Cardiovascular: Positive for palpitations.  Gastrointestinal: Positive for constipation. Negative for nausea and vomiting.       GERD  Musculoskeletal: Positive for joint pain and myalgias.  Skin: Negative for rash.  Neurological: Positive for dizziness and headaches. Negative for seizures.  Psychiatric/Behavioral:   Anxiety  All other systems reviewed and are negative.      Past Medical History:  Diagnosis Date  . Anxiety   . Arthritis   . Cancer ([place])   . Chronic back pain greater than 3 months duration   . Chronic neck pain   . COPD (chronic obstructive pulmonary disease) ([place])   . Cough   . Diabetes mellitus   . Dyspnea   . GERD (gastroesophageal reflux disease)   . H/O hiatal hernia   . Headache(784.0)   . Hemorrhoids   . Herniated disc   . History of kidney stones   . Hx of echocardiogram 2010   normal EF  . Hypertension   . Palpitations   . Shortness of breath on exertion 11/10/11   "sometimes"  . Vertigo   . Vertigo         Past Surgical History:  Procedure Laterality Date  . ABDOMINAL HYSTERECTOMY  1997?   "partial"  . ANTERIOR CERVICAL DECOMP/DISCECTOMY FUSION N/A 12/25/2016   Procedure: ANTERIOR CERVICAL DECOMPRESSION/DISCECTOMY FUSION CERVICAL FOUR CERVICAL FIVE;  Surgeon: Ashok Pall, MD;  Location: [facility];  Service: Neurosurgery;  Laterality: N/A;  . CHOLECYSTECTOMY  ~ 2008  . COLONOSCOPY    . EYE SURGERY     cateracts  . FRACTURE SURGERY  2004?   right shoulder  . KNEE ARTHROSCOPY  2004   right  . KNEE SURGERY     right  . neck fusion    . SHOULDER OPEN ROTATOR CUFF REPAIR  07/20/2011   left  . TONSILLECTOMY          Family History  Problem Relation Age of Onset  . Lung cancer Sister    Social History:  reports that she quit smoking about 2 months ago. Her smoking use included cigarettes. She  has a 7.50 pack-year smoking history. she has never used smokeless tobacco. She reports that she does not drink alcohol or use drugs. Allergies:  Allergies  Allergen Reactions  . Oxycodone Hives and Itching         Medications Prior to Admission  Medication Sig Dispense Refill  . albuterol (PROVENTIL) (2.5 MG/3ML) 0.083% nebulizer solution USE 1 VIAL EVERY 6 HOURS AS NEEDED FOR SHORTNESS OF BREATH  0  . albuterol  (VENTOLIN HFA) 108 (90 Base) MCG/ACT inhaler Inhale 2 puffs into the lungs every 6 (six) hours as needed for wheezing or shortness of breath.    . ALPRAZolam (XANAX) 0.25 MG tablet Take 0.25 mg by mouth daily as needed for anxiety.   2  . amLODipine (NORVASC) 5 MG tablet Take 5 mg by mouth 2 (two) times daily.     . cholecalciferol (VITAMIN D) 1000 units tablet Take 1,000 Units by mouth daily.    . cloNIDine (CATAPRES) 0.1 MG tablet Take 0.1 mg by mouth 2 (two) times daily as needed (high blood pressure).     . conjugated estrogens (PREMARIN) vaginal cream Place 0.5 Applicatorfuls vaginally at bedtime as needed.    . cyclobenzaprine (FLEXERIL) 10 MG tablet Take 10 mg by mouth at bedtime as needed for muscle spasms.    Marland Kitchen dexamethasone (DECADRON) 4 MG tablet Take 4 mg 3 (three) times daily by mouth.    . esomeprazole (NEXIUM) 40 MG capsule Take 40 mg daily as needed by mouth (acid reflux).   2  . gabapentin (NEURONTIN) 100 MG capsule Take 100 mg by mouth daily as needed (for leg pain).   1  . hydrALAZINE (APRESOLINE) 50 MG tablet Take 50 mg 2 (two) times daily by mouth.     . INCRUSE ELLIPTA 62.5 MCG/INH AEPB TAKE 1 PUFF BY MOUTH EVERY DAY AS NEEDED FOR SOB  5  . JANUMET 50-500 MG tablet Take 1 tablet by mouth daily.  6  . KLOR-CON M10 10 MEQ tablet Take 1 tablet (10 mEq total) by mouth daily. (Patient taking differently: Take 10 mEq by mouth 2 (two) times daily. )    . levETIRAcetam (KEPPRA) 500 MG tablet Take 1 tablet (500 mg total) by mouth 2 (two) times daily. 60 tablet 0  . loratadine (CLARITIN) 10 MG tablet Take 10 mg by mouth daily as needed for allergies.    Marland Kitchen meclizine (ANTIVERT) 25 MG tablet Take 25 mg daily as needed by mouth for dizziness or nausea.   1  . metoprolol succinate (TOPROL-XL) 25 MG 24 hr tablet Take 25 mg by mouth daily.  1  . ondansetron (ZOFRAN ODT) 4 MG disintegrating tablet Take 1 tablet (4 mg total) by mouth every 8 (eight) hours as needed for  nausea or vomiting. (Patient taking differently: Take 4 mg by mouth every 8 (eight) hours as needed for nausea or vomiting. DISSOLVE IN THE MOUTH) 20 tablet 0  . rosuvastatin (CRESTOR) 10 MG tablet Take 10 mg by mouth daily.      Home: Home Living Family/patient expects to be discharged to:: Private residence Living Arrangements: Spouse/significant other Available Help at Discharge: Family, Friend(s), Available 24 hours/day(Husband works as a Administrator, but friends available) Type of Home: House Home Access: Stairs to enter Technical brewer of Steps: 2 [place]: One level Bathroom Shower/Tub: Public librarian, Architectural technologist: Standard Home Equipment: Environmental consultant - 2 wheels  Functional History: Prior Function Level of Independence: Independent Comments: ADLs, IADLs, and driving Functional Status:  Mobility:  Bed Mobility Overal bed mobility: Needs Assistance Bed Mobility: Supine to Sit Supine to sit: Mod assist, HOB elevated Sit to supine: Supervision General bed mobility comments: Pt required modA for LE managment off of bed, trunk to upright and pad scoot of hips to EoB Transfers Overall transfer level: Needs assistance Equipment used: Rolling walker (2 wheeled) Transfers: Sit to/from Stand, Technical brewer Transfers Sit to Stand: Max assist General transfer comment: max A for power up to standing, once in standing able to steady, vc for anterior pelvic rotation to attain upright, pt unable to perform hip, knee flexion to be able to lift R LE off of floor, pt sat back down and recliner readjusted to perform stand pivot transfer again required maxA to come to upright and maximal verbal and tactile cuing to pivot to the recliner, pt not able to weightshift to L to offweight R side Ambulation/Gait Ambulation/Gait assistance: Min assist Ambulation Distance (Feet): 60 Feet Assistive device: Rolling walker (2 wheeled) Gait Pattern/deviations: Decreased dorsiflexion -  right, Step-through pattern General Gait Details: unable to attempt due to increased weakness Gait velocity: slow and guarded Gait velocity interpretation: Below normal speed for age/gender  ADL: ADL Overall ADL's : Needs assistance/impaired Eating/Feeding: Set up, Sitting Grooming: Wash/dry face, Oral care, Wash/dry hands, Min guard, Standing Upper Body Bathing: Min guard, Sitting Lower Body Bathing: Minimal assistance, Sit to/from stand Upper Body Dressing : Minimal assistance, Sitting Upper Body Dressing Details (indicate cue type and reason): Pt requiring Min A to don gown like a jacket while seated Lower Body Dressing: Minimal assistance, Sit to/from stand Lower Body Dressing Details (indicate cue type and reason): Pt demonstrating decreased funcitonal performance for donning socks. Pt with decreased grasp strength and sock slipping out of pt's R hand throughout task.  Toilet Transfer: Minimal assistance, Ambulation, Regular Toilet, Grab bars Toilet Transfer Details (indicate cue type and reason): Min A for safe descent. Pt with posterior lean when sitting Toileting- Clothing Manipulation and Hygiene: Set up, Sitting/lateral lean Tub/ Shower Transfer: Moderate assistance, Ambulation, Tub transfer, 3 in 1, Rolling walker, Cueing for sequencing, Cueing for safety Tub/Shower Transfer Details (indicate cue type and reason): Pt requiring Max VCs and Mod physical A for tub transafer. Pt dmeosntrating poor sequencing and motor planning.  Functional mobility during ADLs: Rolling walker, Cueing for sequencing, Cueing for safety, Minimal assistance(Difficulty placing RUE onto RW) General ADL Comments: Pt continues to present with decreased process and motor planning. Pt requiring Max cues for sequencing tub transfer and Min-Mod physical A for safety. Pt reuqiring increased time and effort for donning socks and present with poor grasp stength and sock slipping out of pt's grasp.    Cognition: Cognition Overall Cognitive Status: Impaired/Different from baseline Orientation Level: Oriented X4 Cognition Arousal/Alertness: Awake/alert Behavior During Therapy: WFL for tasks assessed/performed Overall Cognitive Status: Impaired/Different from baseline Area of Impairment: Problem solving Awareness: Intellectual(R sided intattention) Problem Solving: Slow processing, Difficulty sequencing, Requires verbal cues, Requires tactile cues General Comments: pt with difficulty processing multi-step tasks  Blood pressure (!) 128/55, pulse 83, temperature (!) 101.9 F (38.8 C), temperature source Oral, resp. rate 18, height 5\' 7"  (1.702 m), weight 105.8 kg (233 lb 4 oz), SpO2 93 %. Physical Exam  HENT:  Head: Normocephalic.  Eyes:  Pupils reactive to light  Neck: Normal range of motion. Neck supple. No thyromegaly present.  Cardiovascular: Normal rate, regular rhythm and normal heart sounds.  Respiratory: Effort normal and breath sounds normal. No respiratory distress.  GI: Soft. Bowel sounds  are normal. She exhibits no distension.  Neurological: She is alert.  Makes good eye contact with examiner. Followed simple commands. She provides name agent date of birth.  Right upper extremity is 1 5 deltoid bicep and tricep, wrist and hand intrinsics are 2+ out of 5.  Right lower extremity is 1+ out of 5 hip flexor knee extension and 2+ out of 5 ankle dorsiflexion and plantar flexion.  Left upper extremity and lower extremity are 5 out of 5.  Patient with intact sensation to light touch and pain in the right arm and leg.  Mild right facial droop.  Cognitively she is appropriate  Skin: Skin is warm and dry.  Psychiatric: She has a normal mood and affect. Her behavior is normal.  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 Does the need for close, 24 hr/day medical supervision in concert with the patient's rehab needs make it  unreasonable for this patient to be served in a less intensive setting? Yes 2. Co-Morbidities requiring supervision/potential complications: Oncological considerations, COPD, diabetes type 2, hypertension 3. Due to bladder management, bowel management, safety, skin/wound care, disease management, medication administration, pain management and patient education, does the patient require 24 hr/day rehab nursing? Yes 4. Does the patient require coordinated care of a physician, rehab nurse, PT (1-2 hrs/day, 5 days/week) and OT (1-2 hrs/day, 5 days/week), potentially SLP to address physical and functional deficits in the context of the above medical diagnosis(es)? Yes Addressing deficits in the following areas: balance, endurance, locomotion, strength, transferring, bowel/bladder control, bathing, dressing, feeding, grooming, toileting and psychosocial support 5. Can the patient actively participate in an intensive therapy program of at least 3 hrs of therapy per day at least 5 days per week? Yes 6. The potential for patient to make measurable gains while on inpatient rehab is excellent 7. Anticipated functional outcomes upon discharge from inpatient rehab are supervision and min assist  with PT, supervision and min assist with OT, n/a with SLP. 8. Estimated rehab length of stay to reach the above functional goals is: 15-20 days 9. Anticipated D/C setting: Home 10. Anticipated post D/C treatments: [place] therapy 11. Overall Rehab/Functional Prognosis: excellent  RECOMMENDATIONS: This patient's condition is appropriate for continued rehabilitative care in the following setting: CIR Patient has agreed to participate in recommended program. Yes Note that insurance prior authorization [place] be required for reimbursement for recommended care.  Comment: Rehab Admissions Coordinator to follow up.  Thanks,  Meredith Staggers, MD, Mellody Drown    Cathlyn Parsons., PA-C 10/07/2017          Revision  History                        Routing History

## 2017-10-12 NOTE — Evaluation (Signed)
Speech Language Pathology Assessment and Plan  Patient Details  Name: [name] MRN: [id number] Date of Birth: 1946-02-09  SLP Diagnosis: Cognitive Impairments  Rehab Potential: Good ELOS: 17-21 days     Today's Date: 10/12/2017 SLP Individual Time: 1330-1400 SLP Individual Time Calculation (min): 30 min   Problem List:  Patient Active Problem List   Diagnosis Date Noted  . Hypoalbuminemia due to protein-calorie malnutrition ([place])   . Leukocytosis   . Metastatic lung cancer (metastasis from lung to other site) ([place]) 10/11/2017  . Brain metastasis ([place])   . Seizure prophylaxis   . Hyperlipidemia   . Slow transit constipation   . Hyponatremia   . Stage 3 chronic kidney disease ([place])   . Type 2 diabetes mellitus with peripheral neuropathy ([facility])   . Benign essential HTN   . Tobacco abuse   . 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 GERD (gastroesophageal reflux disease) 08/16/2017  . Lung mass 08/16/2017  . Shortness of breath 07/05/2017    Class: Acute  . Vaginal odor 02/10/2017  . Dysuria 02/10/2017  . Osteoarthritis of spine with radiculopathy, cervical region 12/25/2016  . COPD (chronic obstructive pulmonary disease) ([place]) 10/11/2015  . Orthostatic hypotension 06/18/2013  . Numbness and tingling in right hand 11/10/2011    Class: Acute  . Hypertension 11/10/2011  . Cervical disc disease 11/10/2011    Class: Chronic  . DM II (diabetes mellitus, type II), controlled ([place]) 11/10/2011    Class: Chronic   Past Medical History:  Past Medical History:  Diagnosis Date  . Anxiety   . Arthritis   . Cancer ([place])   . Chronic back pain greater than 3 months duration   . Chronic neck pain   . COPD (chronic obstructive pulmonary disease) ([place])   . Cough   . Diabetes mellitus   . Dyspnea   . GERD (gastroesophageal reflux disease)   . H/O hiatal hernia   . Headache(784.0)   . Hemorrhoids   . Herniated disc   . History of kidney stones   . Hx of echocardiogram  2010   normal EF  . Hypertension   . Palpitations   . Shortness of breath on exertion 11/10/11   "sometimes"  . Vertigo   . Vertigo    Past Surgical History:  Past Surgical History:  Procedure Laterality Date  . ABDOMINAL HYSTERECTOMY  1997?   "partial"  . ANTERIOR CERVICAL DECOMP/DISCECTOMY FUSION N/A 12/25/2016   Procedure: ANTERIOR CERVICAL DECOMPRESSION/DISCECTOMY FUSION CERVICAL FOUR CERVICAL FIVE;  Surgeon: Ashok Pall, MD;  Location: [facility];  Service: Neurosurgery;  Laterality: N/A;  . CHOLECYSTECTOMY  ~ 2008  . COLONOSCOPY    . EYE SURGERY     cateracts  . FRACTURE SURGERY  2004?   right shoulder  . KNEE ARTHROSCOPY  2004   right  . KNEE SURGERY     right  . neck fusion    . SHOULDER OPEN ROTATOR CUFF REPAIR  07/20/2011   left  . TONSILLECTOMY      Assessment / Plan / Recommendation Clinical Impression   Kirsten F Terryis a 71 y.o.right handed femalewith history of diabetes mellitus, CKD stage III, COPD with tobacco abuse. Per chart review patient lives with spouse.Spouse works during the day but daughters can assist.One level home to steps to entry.Patient was independent through October with ADLs and driving. 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 Earlie Server. Patient had  been scheduled for a VATS right upper lobectomy. Nodule with metastatic lesion in left cerebral hemisphere and noted progressive weakness of right her right side as well as increasing back pain . MRI 09/23/2017 of the brain reviewed, showing left frontal lesion.  Per report, 10 mm left posterior frontal mass/with moderate vasogenic edema. Decadron was added to decreased peri-infarct swelling. Underwent CT-guided core needle biopsy of right upper lobe nodule 09/24/2017. Intermittent bouts of altered mental status EEG negative for seizure. Maintained on Keppra for seizure prophylaxis. Oncology to follow-up  after final pathology reports on plan of care. Latest follow-up CT again shows superior left frontal lobe hemorrhagic metastasis with vasogenic edema 10/02/2017. Spiked a low-grade temperature and rocephin added.  Blood cultures negative and antibiotic later discontinued. Physical and occupational therapy evaluations completed. Patient with ongoing increased weakness of lower extremities limiting overall mobility and as of 10/06/2017 recommendations made for physical medicine rehabilitation consult. Patient was admitted for a comprehensive rehabilitation program.  SLP evaluation was completed on 10/12/2017 with the following results:  Pt presents with mild cognitive deficits; however, evaluation was limited due to fatigue.  Pt has decreased recall of information, decreased functional problem solving, and decreased emergent awareness of deficits.  As a result, pt would benefit from skilled ST while inpatient in order to maximize functional independence and reduce burden of care prior to discharge.  Discharge recommendations to be determined pending pt's progress made while inpatient.      Skilled Therapeutic Interventions          Cognitive-linguistic evaluation completed with results and recommendations reviewed with patient.  Pt scored 15/22 on the MoCA-Blind (n>/18) and needed min assist to complete various subtests due to the deficits mentioned above.  Pt was left in bed with bed alarm set and call bell within reach.       SLP Assessment  Patient will need skilled [place] [facility]ology Services during CIR admission    Recommendations  Patient destination: Home Follow up Recommendations: Other (comment)(TBD) Equipment Recommended: None recommended by SLP    SLP Frequency 3 to 5 out of 7 days   SLP Duration  SLP Intensity  SLP Treatment/Interventions 17-21 days   Minumum of 1-2 x/day, 30 to 90 minutes  Cognitive remediation/compensation;Cueing hierarchy;Functional tasks;Patient/family  education;Environmental controls;Internal/external aids    Pain Pain Assessment Pain Assessment: No/denies pain  Prior Functioning Cognitive/Linguistic Baseline: Within functional limits Type of Home: House  Lives With: Spouse Available Help at Discharge: Available 24 hours/day;Family Vocation: Retired  Function:  Eating Eating     Eating Assist Level: No help, No cues           Cognition Comprehension Comprehension assist level: Follows basic conversation/direction with no assist  Expression   Expression assist level: Expresses basic needs/ideas: With no assist  Social Interaction Social Interaction assist level: Interacts appropriately 90% of the time - Needs monitoring or encouragement for participation or interaction.  Problem Solving Problem solving assist level: Solves basic 75 - 89% of the time/requires cueing 10 - 24% of the time  Memory Memory assist level: Recognizes or recalls 75 - 89% of the time/requires cueing 10 - 24% of the time   Short Term Goals: Week 1: SLP Short Term Goal 1 (Week 1): Pt will recall new, daily information with supervision verbal cues for use of external aids.   SLP Short Term Goal 2 (Week 1): Pt will complete semi-complex tasks with supervision verbal cues for functional problem solving.   SLP Short Term Goal 3 (  Week 1): Pt will recognize and correct errors in the moment during functional tasks with supervision verbal cues.    Refer to Care Plan for Long Term Goals  Recommendations for other services: Neuropsych  Discharge Criteria: Patient will be discharged from SLP if patient refuses treatment 3 consecutive times without medical reason, if treatment goals not met, if there is a change in medical status, if patient makes no progress towards goals or if patient is discharged from hospital.  The above assessment, treatment plan, treatment alternatives and goals were discussed and mutually agreed upon: by patient  Emilio Math 10/12/2017, 10:35 PM

## 2017-10-12 NOTE — Progress Notes (Signed)
[facility]ILITATION     PROGRESS NOTE  Subjective/Complaints:  Pt seen laying in bed this AM.  She slept well overnight.  She states she has to use the bathroom, but cannot use the bedpan.  She is ready for therapies.   ROS: Denies CP, SOB, N/V/D.  Objective: Vital Signs: Blood pressure 129/61, pulse 64, temperature 98 F (36.7 C), temperature source Oral, resp. rate 18, height 5\' 7"  (1.702 m), weight 103.3 kg (227 lb 11.8 oz), SpO2 98 %. No results found. Recent Labs    10/12/17 0526  WBC 11.9*  HGB 12.5  HCT 37.1  PLT 316   Recent Labs    10/12/17 0526  NA 136  K 4.1  CL 100*  GLUCOSE 119*  BUN 27*  CREATININE 1.08*  CALCIUM 9.2   CBG (last 3)  Recent Labs    10/11/17 1622 10/11/17 2155 10/12/17 0630  GLUCAP 309* 256* 105*    Wt Readings from Last 3 Encounters:  10/11/17 103.3 kg (227 lb 11.8 oz)  10/11/17 105.4 kg (232 lb 5.8 oz)  09/28/17 101.2 kg (223 lb)    Physical Exam:  BP 129/61 (BP Location: Left Arm)   Pulse 64   Temp 98 F (36.7 C) (Oral)   Resp 18   Ht 5\' 7"  (1.702 m)   Wt 103.3 kg (227 lb 11.8 oz)   SpO2 98%   BMI 35.67 kg/m  Constitutional: She appears well-developed. Obese.   HENT: Normocephalic and atraumatic.  Eyes: EOM are normal. No discharge.  Cardiovascular: Normal rate and regular rhythm. No JVD.  Respiratory: Effort normal and breath sounds normal.  GI: Bowel sounds are normal. She exhibits no distension.  Musculoskeletal: She exhibits no edema or tenderness.  Neurological: She is alert.  Makes good eye contact with examiner.  Followed simple commands.  Motor: RUE: 3/5 proximal to distal RLE: 2/5 HF, 2+/5 KE, ADF/PF (stable) LUE: 4+/5 proximal to distal LLE: 5/5 proximal to distal Skin: Skin is warm and dry.  Psychiatric: She has a normal mood and affect. Her behavior is normal.   Assessment/Plan: 1. Functional deficits secondary to metastatic lung cancer with left frontal lobe hemorrhagic  metastatic lesion which require 3+ hours per day of interdisciplinary therapy in a comprehensive inpatient rehab setting. Physiatrist is providing close team supervision and 24 hour management of active medical problems listed below. Physiatrist and rehab team continue to assess barriers to discharge/monitor patient progress toward functional and medical goals.  Function:  Bathing Bathing position      Bathing parts      Bathing assist        Upper Body Dressing/Undressing Upper body dressing                    Upper body assist        Lower Body Dressing/Undressing Lower body dressing                                  Lower body assist        Toileting Toileting          Toileting assist     Transfers Chair/bed transfer             Locomotion Ambulation           Wheelchair          Cognition Comprehension    Expression  Social Interaction    Problem Solving    Memory      Medical Problem List and Plan: 1.  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 Decadron added to decreased peri-infarct swelling and taper as needed   Begin CIR 2.  DVT Prophylaxis/Anticoagulation: SCDs. Monitor for any signs of DVT.    Vascular study pending 3. Pain Management: Hydrocodone and Neurontin as needed 4. Mood: Xanax 0.25 mg daily as needed 5. Neuropsych: This patient is capable of making decisions on her own behalf. 6. Skin/Wound Care: Routine skin checks 7. Fluids/Electrolytes/Nutrition: Routine I&O's 8. Seizure prophylaxis. Keppra 500 mg twice a day 9. Diabetes mellitus and peripheral neuropathy. Hemoglobin A1c 8.1. NovoLog 4 units 3 times a day, Lantus insulin 40 units daily, Glucophage 500 mg daily, Tradjenta 5 mg daily. Check blood sugars before meals and at bedtime   Monitor with increased mobility, extremely labile at present 10. Hypertension. Cozaar 25 mg daily, Toprol 25  mg daily, Norvasc 5 mg twice a day,Hydralazine 25 mg twice daily.    Monitor with increased mobility 11. COPD with tobacco abuse. Continue inhalers. Provide counseling 12. CKD stage III. Creatinine baseline 1.28.    Cr 1.08 on 11/20   Encourage fluids   Cont to monitor 13. Hyponatremia. Resolved   Na+ 136 on 11/20 14. Constipation. Laxative assistance 15. Hyperlipidemia. Crestor 16. Hypoalbuminemia   Supplement initiated on 11/20 17. Leukocytosis   WBCs 11.9 on 11/20, likely secondary to steroids   Cont to monitor   Afebrile  LOS (Days) 1 A FACE TO FACE EVALUATION WAS PERFORMED  Oval Cavazos Lorie Phenix 10/12/2017 8:04 AM

## 2017-10-12 NOTE — Progress Notes (Signed)
PMR Admission Coordinator Pre-Admission Assessment  Patient: [name] is an 71 y.o., female MRN: [id number] DOB: Mar 14, 1946 Height: 5\' 7"  (170.2 cm) Weight: 105.4 kg (232 lb 5.8 oz)                                                                                                                                                  Insurance Information HMO:     PPO: X     PCP:      IPA:      80/20:      OTHER:  PRIMARY: AETNA Medicare       Policy#: [id number]      Subscriber: Self CM Name: Su Hoff       Phone#: [phone]     Fax#: [phone] Pre-Cert#: [id number] approval given by Adela Lank with updates due 10/08/17 to Janett Billow    Employer: Retired  Benefits:  Phone #: [phone]     Name: Verified online, Availity  Eff. Date: 02/22/16     Deduct: $0      Out of Pocket Max: $4500      Life Max: N/A CIR: $250 a day, days 1-6 ($1500 max per admission)      SNF: $0 a day, days 1-20; $164 a day, days 21-100 Outpatient: PT/OT/SLP necessity     Co-Pay: $40 a visit  Home Health: PT/OT/SLP necessity, 100%      Co-Pay: none DME: 80%     Co-Pay: 20% Providers: In network   SECONDARY: None      Policy#:       Subscriber:  CM Name:       Phone#:      Fax#:  Pre-Cert#:       Employer:  Benefits:  Phone #:      Name:  Eff. Date:      Deduct:       Out of Pocket Max:       Life Max:  CIR:       SNF:  Outpatient:      Co-Pay:  Home Health:       Co-Pay:  DME:      Co-Pay:   Medicaid Application Date:       Case Manager:  Disability Application Date:       Case Worker:   Emergency Contact Information        Contact Information    Name Relation Home Work Mobile   White,Kirsten Spouse [phone]  [phone]   White,Kirsten Daughter   [phone]   Aubery Lapping Other [phone]       Current Medical History  Patient Admitting Diagnosis: Likely metastatic lung cancer to left brainwith right hemiparesis  History of Present Illness: Kirsten F. Terryis a 71 y.o.right  handed femalewith history of diabetes mellitus,CKDstage III,COPD with tobacco abuse. Per chart review patient lives with spouse.Spouse works during the  day but daughter and family can assist.One level home to steps to entry.Patient was independent through October with ADLs and driving. 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 Earlie Server. Patient had been scheduled for a VATS right upper lobectomy. Nodule with metastatic lesion in left cerebral hemisphereandnoted progressive weakness ofright her right side as well as increasing back pain. MRI 11/01/2018ofthe brain showed a 10 mm left posterior frontal mass/with moderate vasogenic edema.Decadron was added to decreased peri-infarct swelling.Underwent CT-guided core needle biopsy of right upper lobe nodule 09/24/2017. Intermittent bouts of altered mental status EEG negative for seizure.Maintained on Keppra for seizure prophylaxis.Oncologytofollow-up after final pathology reports on plan of care. Latest follow-up CT again shows superior left frontal lobe hemorrhagic metastasis with vasogenic edema 10/02/2017.Spiked a low-grade temperature. Rocephin added blood cultures negative and antibiotic later discontinued.Physicaland occupationaltherapy evaluationscompleted. Patient with ongoing increased weakness of lower extremities limiting overall mobility and as of 10/06/2017 recommendations made for physical medicine rehabilitation consult.Patient was admitted for a comprehensive rehabilitation program 10/11/17.    Past Medical History      Past Medical History:  Diagnosis Date  . Anxiety   . Arthritis   . Cancer ([place])   . Chronic back pain greater than 3 months duration   . Chronic neck pain   . COPD (chronic obstructive pulmonary disease) ([place])   . Cough   . Diabetes mellitus   . Dyspnea   . GERD (gastroesophageal reflux  disease)   . H/O hiatal hernia   . Headache(784.0)   . Hemorrhoids   . Herniated disc   . History of kidney stones   . Hx of echocardiogram 2010   normal EF  . Hypertension   . Palpitations   . Shortness of breath on exertion 11/10/11   "sometimes"  . Vertigo   . Vertigo     Family History  family history includes Lung cancer in her sister.  Prior Rehab/Hospitalizations:  Has the patient had major surgery during 100 days prior to admission? Yes  Current Medications   Current Facility-Administered Medications:  .  0.9 %  sodium chloride infusion, , Intravenous, Continuous, Dixie Dials, MD, Stopped at 10/08/17 1425 .  acetaminophen (TYLENOL) tablet 500 mg, 500 mg, Oral, Q6H PRN, Dixie Dials, MD, 500 mg at 10/08/17 1449 .  albuterol (PROVENTIL) (2.5 MG/3ML) 0.083% nebulizer solution 2.5 mg, 2.5 mg, Nebulization, Q6H PRN, Dixie Dials, MD, 2.5 mg at 10/11/17 0453 .  ALPRAZolam Duanne Moron) tablet 0.25 mg, 0.25 mg, Oral, Daily PRN, Dixie Dials, MD, 0.25 mg at 10/11/17 0223 .  cefTRIAXone (ROCEPHIN) 1 g in dextrose 5 % 50 mL IVPB, 1 g, Intravenous, Q24H, Dixie Dials, MD, Stopped at 10/10/17 1458 .  dexamethasone (DECADRON) tablet 4 mg, 4 mg, Oral, Daily, Dixie Dials, MD, 4 mg at 10/11/17 1029 .  gabapentin (NEURONTIN) capsule 100 mg, 100 mg, Oral, Daily PRN, Dixie Dials, MD, 100 mg at 10/06/17 2246 .  HYDROcodone-acetaminophen (NORCO/VICODIN) 5-325 MG per tablet 1 tablet, 1 tablet, Oral, Q4H PRN, Dixie Dials, MD, 1 tablet at 10/09/17 0820 .  insulin aspart (novoLOG) injection 0-20 Units, 0-20 Units, Subcutaneous, TID WC, Dixie Dials, MD, 11 Units at 10/11/17 1234 .  insulin aspart (novoLOG) injection 4 Units, 4 Units, Subcutaneous, TID WC, Dixie Dials, MD, 4 Units at 10/11/17 1238 .  insulin glargine (LANTUS) injection 40 Units, 40 Units, Subcutaneous, QHS, Dixie Dials, MD, 40 Units at 10/10/17 2216 .  levETIRAcetam (KEPPRA) tablet  500 mg, 500 mg, Oral,  BID, Dixie Dials, MD, 500 mg at 10/11/17 1030 .  metFORMIN (GLUCOPHAGE) tablet 500 mg, 500 mg, Oral, Q breakfast, 500 mg at 10/11/17 0808 **AND** linagliptin (TRADJENTA) tablet 5 mg, 5 mg, Oral, Daily, Dixie Dials, MD, 5 mg at 10/11/17 0807 .  loratadine (CLARITIN) tablet 10 mg, 10 mg, Oral, Daily PRN, Dixie Dials, MD, 10 mg at 10/03/17 1638 .  losartan (COZAAR) tablet 25 mg, 25 mg, Oral, Daily, Dixie Dials, MD, 25 mg at 10/11/17 1030 .  meclizine (ANTIVERT) tablet 25 mg, 25 mg, Oral, TID, Dixie Dials, MD, 25 mg at 10/11/17 1030 .  metoprolol succinate (TOPROL-XL) 24 hr tablet 25 mg, 25 mg, Oral, Daily, Dixie Dials, MD, 25 mg at 10/11/17 1030 .  ondansetron (ZOFRAN-ODT) disintegrating tablet 4 mg, 4 mg, Oral, Q8H PRN, Dixie Dials, MD .  pantoprazole (PROTONIX) EC tablet 40 mg, 40 mg, Oral, Daily, Dixie Dials, MD, 40 mg at 10/11/17 1031 .  polyethylene glycol (MIRALAX / GLYCOLAX) packet 17 g, 17 g, Oral, Daily PRN, Dixie Dials, MD, 17 g at 10/09/17 0829 .  potassium chloride (K-DUR,KLOR-CON) CR tablet 10 mEq, 10 mEq, Oral, Daily, Dixie Dials, MD, 10 mEq at 10/11/17 1030 .  rosuvastatin (CRESTOR) tablet 10 mg, 10 mg, Oral, Daily, Doylene Canard, Ajay, MD, 10 mg at 10/10/17 1735 .  umeclidinium bromide (INCRUSE ELLIPTA) 62.5 MCG/INH 1 puff, 1 puff, Inhalation, Daily, Dixie Dials, MD, 1 puff at 10/10/17 0731  Facility-Administered Medications Ordered in Other Encounters:  .  fentaNYL (SUBLIMAZE) injection, , , Anesthesia Intra-op, Candis Shine, CRNA, 50 mcg at 09/09/17 0718 .  lactated ringers infusion, , , Continuous PRN, Harder, Rebeca Alert, CRNA .  lactated ringers infusion, , , Continuous PRN, Harder, Rebeca Alert, CRNA .  midazolam (VERSED) 5 MG/5ML injection, , , Anesthesia Intra-op, Harder, Blaire S, CRNA, 1 mg at 09/09/17 3818  Patients Current Diet: Diet regular Room service appropriate? Yes; Fluid consistency: Thin  Precautions / Restrictions Precautions Precautions:  Fall Restrictions Weight Bearing Restrictions: No   Has the patient had 2 or more falls or a fall with injury in the past year?No  Prior Activity Level Community (5-7x/wk): Prior to admission patient was retired, fully independent, and active.    Home Assistive Devices / Equipment Home Assistive Devices/Equipment: CBG Meter Home Equipment: Walker - 2 wheels  Prior Device Use: Indicate devices/aids used by the patient prior to current illness, exacerbation or injury? None of the above, implemented a rolling walker a couple days prior to admission.  Prior Functional Level Prior Function Level of Independence: Independent Comments: ADLs, IADLs, and driving  Self Care: Did the patient need help bathing, dressing, using the toilet or eating? Independent  Indoor Mobility: Did the patient need assistance with walking from room to room (with or without device)? Independent  Stairs: Did the patient need assistance with internal or external stairs (with or without device)? Independent  Functional Cognition: Did the patient need help planning regular tasks such as shopping or remembering to take medications? Independent  Current Functional Level Cognition  Overall Cognitive Status: Impaired/Different from baseline Orientation Level: Oriented X4 General Comments: pt with better ability to process multi-step tasks today, especially sequencing    Extremity Assessment (includes Sensation/Coordination)  Upper Extremity Assessment: RUE deficits/detail RUE Deficits / Details: RUE with decreased strength and coordination as seen during ADLs and testing. Pt requiring increased time and effort to perform coordination testing. pt able to perform finger opposition, bring hand ot mouth, and  bring hand to top of head with increased time. RUE Coordination: decreased fine motor, decreased gross motor  Lower Extremity Assessment: Generalized weakness    ADLs  Overall ADL's : Needs  assistance/impaired Eating/Feeding: Set up, Sitting Grooming: Oral care, Min guard, Sitting(Stedy) Grooming Details (indicate cue type and reason): Pt performing oral care while sitting and standing at stedy. Pt dmeonstrating increased activity tolerance. Pt demonstrating decreased FM skills and in-hand manipulation to hold tooth brush. Upper Body Bathing: Min guard, Sitting Lower Body Bathing: Minimal assistance, Sit to/from stand Upper Body Dressing : Minimal assistance, Sitting Upper Body Dressing Details (indicate cue type and reason): Pt requiring Min A to don gown like a jacket while seated Lower Body Dressing: Minimal assistance, Sit to/from stand Lower Body Dressing Details (indicate cue type and reason): Pt demonstrating decreased funcitonal performance for donning socks. Pt with decreased grasp strength and sock slipping out of pt's R hand throughout task.  Toilet Transfer: Minimal assistance, Ambulation, Regular Toilet, Grab bars Toilet Transfer Details (indicate cue type and reason): Min A for safe descent. Pt with posterior lean when sitting Toileting- Clothing Manipulation and Hygiene: Set up, Sitting/lateral lean Tub/ Shower Transfer: Moderate assistance, Ambulation, Tub transfer, 3 in 1, Rolling walker, Cueing for sequencing, Cueing for safety Tub/Shower Transfer Details (indicate cue type and reason): Pt requiring Max VCs and Mod physical A for tub transafer. Pt dmeosntrating poor sequencing and motor planning.  Functional mobility during ADLs: Maximal assistance, +2 for physical assistance, Rolling walker, Cueing for sequencing General ADL Comments: Pt demonstrating poor activity tolerance and funcitonal use of RUE/RLE. Educated pt on learned non-use.     Mobility  Overal bed mobility: Needs Assistance Bed Mobility: Supine to Sit Supine to sit: HOB elevated, Min assist Sit to supine: Supervision General bed mobility comments: minA for trunk to upright, pt able to mange LE  off bed and scoot her hips to EoB    Transfers  Overall transfer level: Needs assistance Equipment used: Rolling walker (2 wheeled) Transfer via Lift Equipment: Stedy Transfers: Sit to/from [employer] to Stand: +2 physical assistance, Mod assist General transfer comment: modA for power up to standing in [place] x3, to RW x2, vc for hand placement and LE placement     Ambulation / Gait / Stairs / Wheelchair Mobility  Ambulation/Gait Ambulation/Gait assistance: +2 physical assistance, Max assist Ambulation Distance (Feet): 5 Feet Assistive device: Rolling walker (2 wheeled) Gait Pattern/deviations: Step-to pattern, Shuffle, Decreased step length - right General Gait Details: modA for offweighting R LE and placement of R LE for side stepping from foot of bed to head of bed, vc for sequencing and R LE hip, knee flexion and purposeful placement of R foot Gait velocity: slow Gait velocity interpretation: Below normal speed for age/gender    Posture / Balance Dynamic Sitting Balance Sitting balance - Comments: Able to lean forward to adjust socks without difficulty Balance Overall balance assessment: Needs assistance Sitting-balance support: No upper extremity supported, Feet supported Sitting balance-Leahy Scale: Good Sitting balance - Comments: Able to lean forward to adjust socks without difficulty Standing balance support: Bilateral upper extremity supported Standing balance-Leahy Scale: Poor Standing balance comment: pt requires physical assist at this time    Special needs/care consideration BiPAP/CPAP: No CPM: No Continuous Drip IV: No Dialysis: No        Life Vest: No Oxygen: No Special Bed: No, but has padded bed rails  Trach Size: No Wound Vac (area): No       Skin: WDL  Bowel mgmt: Continent, last BM 10/11/17 Bladder mgmt: Continent, but reports more urgency and frequency Diabetic mgmt: Yes, that she managed with diet, exercise, and  medications      Previous Home Environment Living Arrangements: Spouse/significant other Available Help at Discharge: Family, Friend(s), Available 24 hours/day(Husband works as a Administrator, but friends available) Type of Home: [place]: One level Home Access: Stairs to enter Technical brewer of Steps: 2 Bathroom Shower/Tub: Public librarian, Architectural technologist: [place]: Other (Comment)  Discharge Living Setting Plans for Discharge Living Setting: Patient's home, Lives with (comment)(Spouse) Type of Home at Discharge: House Discharge Home Layout: One level Discharge Home Access: Stairs to enter Entrance Stairs-Rails: None Entrance Stairs-Number of Steps: 3 Discharge Bathroom Shower/Tub: Tub/shower unit, Curtain Discharge Bathroom Toilet: Standard Discharge Bathroom Accessibility: Yes How Accessible: Accessible via walker Does the patient have any problems obtaining your medications?: No  Social/Family/Support Systems Patient Roles: Spouse, Parent, Other (Comment)(Grandparent ) Contact Information: Spouse: Trudee Kuster  Anticipated Caregiver: Spouse and daughter Pierce Crane  Anticipated Caregiver's Contact Information: Spouse Cell: [phone] Ability/Limitations of Caregiver: Spouse works but daughter and other family can assist  Caregiver Availability: 24/7 Discharge Plan Discussed with Primary Caregiver: Yes Is Caregiver In Agreement with Plan?: Yes Does Caregiver/Family have Issues with Lodging/Transportation while Pt is in Rehab?: No  Goals/Additional Needs Patient/Family Goal for Rehab: PT/OT Supervision-Min A Expected length of stay: 15-20 days  Cultural Considerations: [employer] Dietary Needs: Regular textures and thin liquids Equipment Needs: TBD Pt/Family Agrees to Admission and willing to participate: Yes Program Orientation Provided & Reviewed with Pt/Caregiver Including Roles  & Responsibilities:  Yes Additional Information Needs: Acute medical MD decided to not get a Pallative consult, oncology to follow up as an outpatient, and patient's focus is to recover and regain her independence  Information Needs to be Provided By: Team FYI  Decrease burden of Care through IP rehab admission: No  Possible need for SNF placement upon discharge: No  Patient Condition: This patient's medical and functional status has changed since the consult dated: 10/07/17 in which the Rehabilitation Physician determined and documented that the patient's condition is appropriate for intensive rehabilitative care in an inpatient rehabilitation facility. See "History of Present Illness" (above) for medical update. Functional changes are: Min A bed mobility and Mod A +2 transfers. Patient's medical and functional status update has been discussed with the Rehabilitation physician and patient remains appropriate for inpatient rehabilitation. Will admit to inpatient rehab today.  Preadmission Screen Completed By:  Gunnar Fusi, 10/11/2017 1:33 PM ______________________________________________________________________   Discussed status with Dr. Posey Pronto on 10/11/17 at 1350 and received telephone approval for admission today.  Admission Coordinator:  Gunnar Fusi, time 1350/Date 10/11/17             Cosigned by: Jamse Arn, MD at 10/11/2017 2:06 PM  Revision History

## 2017-10-12 NOTE — Progress Notes (Signed)
Occupational Therapy Session Note  Patient Details  Name: [name] MRN: [id number] Date of Birth: 26-Feb-1946  Today's Date: 10/12/2017 OT Individual Time: 4098-1191 OT Individual Time Calculation (min): 45 min    Short Term Goals: Week 1:  OT Short Term Goal 1 (Week 1): Pt will complete toilet transfer with MinA.  OT Short Term Goal 2 (Week 1): Pt will complete 2/3 parts of LB dressing. OT Short Term Goal 3 (Week 1): Pt will demonstrate hemi techniques for completing UB dressing with min verbal cues.   Skilled Therapeutic Interventions/Progress Updates:  Focus on right Ue/ hand coordination. Performed Blocks and box test.  Performed 48 on the left and 16 on the right. Pt reports feelings of heaviness and difficulty with coordination of right hand with increase success with visual attention to extremity. Progressed to sit to stands; using bilateral Ues to push up to come in to standing with min A with VC for anterior weight shift. Able to progress to [facility] task at table top in standing with focus on weight shift towards right with tactile cues for right knee activation and control.  Min A transfer stand pivot into the recliner. Left with call bell.   Balance/vestibular training;Discharge planning;Self Care/advanced ADL retraining;Therapeutic Activities;UE/LE Coordination activities;Cognitive remediation/compensation;Disease mangement/prevention;Functional mobility training;Patient/family education;Therapeutic Exercise;Community reintegration;UE/LE Strength taining/ROM;Wheelchair propulsion/positioning;DME/adaptive equipment instruction   Therapy Documentation Precautions:  Precautions Precautions: Fall Restrictions Weight Bearing Restrictions: No General: General Chart Reviewed: Yes Vital Signs: Therapy Vitals Temp: 98.1 F (36.7 C) Temp Source: Oral Pulse Rate: 94 Resp: 18 BP: (!) 85/60(RN Notified) Patient Position (if appropriate): Sitting Oxygen Therapy SpO2: 99 % O2  Device: Not Delivered Pain: Pain Assessment Pain Assessment: No/denies pain ADL: ADL ADL Comments: please see functional navigator  Vision Baseline Vision/History: Wears glasses Wears Glasses: At all times Patient Visual Report: Blurring of vision;Other (comment)(watery and sometimes sees spots (L eye) ) Vision Assessment?: Vision impaired- to be further tested in functional context Eye Alignment: Within Functional Limits Tracking/Visual Pursuits: Decreased smoothness of horizontal tracking;Decreased smoothness of vertical tracking;Decreased smoothness of eye movement to RIGHT superior field;Decreased smoothness of eye movement to RIGHT inferior field Additional Comments: noted some R inattention  See Function Navigator for Current Functional Status.   Therapy/Group: Individual Therapy  Willeen [place] [facility] 10/12/2017, 2:24 PM

## 2017-10-13 ENCOUNTER — Inpatient Hospital Stay (HOSPITAL_COMMUNITY): Payer: Medicare HMO

## 2017-10-13 ENCOUNTER — Inpatient Hospital Stay (HOSPITAL_COMMUNITY): Payer: Medicare HMO | Admitting: Occupational Therapy

## 2017-10-13 ENCOUNTER — Inpatient Hospital Stay (HOSPITAL_COMMUNITY): Payer: Medicare HMO | Admitting: Physical Therapy

## 2017-10-13 ENCOUNTER — Inpatient Hospital Stay (HOSPITAL_COMMUNITY): Payer: Medicare HMO | Admitting: Speech Pathology

## 2017-10-13 DIAGNOSIS — R05 Cough: Secondary | ICD-10-CM

## 2017-10-13 DIAGNOSIS — R059 Cough, unspecified: Secondary | ICD-10-CM

## 2017-10-13 LAB — GLUCOSE, CAPILLARY
GLUCOSE-CAPILLARY: 182 mg/dL — AB (ref 65–99)
GLUCOSE-CAPILLARY: 162 mg/dL — AB (ref 65–99)
Glucose-Capillary: 100 mg/dL — ABNORMAL HIGH (ref 65–99)
Glucose-Capillary: 233 mg/dL — ABNORMAL HIGH (ref 65–99)

## 2017-10-13 NOTE — Progress Notes (Signed)
Physical Therapy Note  Patient Details  Name: [name] MRN: [id number] Date of Birth: June 06, 1946 Today's Date: 10/13/2017   2023-3435, 30 min individual tx Pain: " a little chest discomfort"  Awaiting chest Xray; better at end of session  Pt finishing up with B and D.  Pt donned socks and shoes with set up in sitting, min assist to get heel into R shoe.  Pt had kizziness after leaning over for shoe the 2nd trial,  This passed in less than 1 min.Sit> stand with RW with min guard assist.  Pt stood x 2 min to pull up pants.  Standing neuro re-ed: 10 x 1 mini squats, r terminal knee extension with control, calf raises.  All with UE support on  RW and with seated rest breaks as needed.  Seated bil ankle pumps and R ankle circles.  Pt left resting in w/c with quick release belt applied and all needs within reach.  See function navigator for current status.  [name] 10/13/2017, 11:55 AM

## 2017-10-13 NOTE — Plan of Care (Signed)
Pt denies any pain Medicated for anxiety with relief Skin intact Continent of bowel / bladder

## 2017-10-13 NOTE — Plan of Care (Signed)
Pt denies any pain Medicated for anxiety with relief Skin intact

## 2017-10-13 NOTE — Progress Notes (Signed)
Occupational Therapy Session Note  Patient Details  Name: [name] MRN: [id number] Date of Birth: 1946/09/20  Today's Date: 10/13/2017 OT Individual Time: 1100-1130 OT Individual Time Calculation (min): 30 min    Short Term Goals: Week 1:  OT Short Term Goal 1 (Week 1): Pt will complete toilet transfer with MinA.  OT Short Term Goal 2 (Week 1): Pt will complete 2/3 parts of LB dressing. OT Short Term Goal 3 (Week 1): Pt will demonstrate hemi techniques for completing UB dressing with min verbal cues.   Skilled Therapeutic Interventions/Progress Updates:    Pt received in bed and requested a shower. She was able to sit to EOB with min A and then sit to stand with RW with min A to transfer to w/c.  Assist with controlled sit to stand on and off tub bench in shower. In shower she actively used RUE to wash LUE.  Stood with min A to wash perineal area with A for buttocks. Transferred back to w/c for dressing, hand off to PT for next session.    Therapy Documentation Precautions:  Precautions Precautions: Fall Restrictions Weight Bearing Restrictions: No   Pain: Pain Assessment Pain Assessment: No/denies pain ADL: ADL ADL Comments: please see functional navigator    See Function Navigator for Current Functional Status.   Therapy/Group: Individual Therapy  [place] 10/13/2017, 1:06 PM

## 2017-10-13 NOTE — Progress Notes (Signed)
Physical Therapy Note  Patient Details  Name: Adalyne F Lipkin MRN: [id number] Date of Birth: May 19, 1946 Today's Date: 10/13/2017    Time: 1300-1405 65 minutes  1:1 No c/o pain.  Pt c/o fatigue but agreeable to therapy.  Gait with RW 25', 30', 25' with RW with min A for Rt LE when fatigued.  nustep for UE/LE strength and coordination x 8 mins level 4.  Stair negotiation with 1 handrail 2 steps x 2 with min/mod A especially for descending with Rt LE.  Standing ball toss and ball kick with min A for balance.  Pt given HEP for supine therex for LE and core strength, pt too fatigued to perform but verbalizes understanding.  pt able to doff shoes sitting edge of bed with supervision, increased time.  Pt left in bed with needs at hand.   Beyza Bellino 10/13/2017, 2:44 PM

## 2017-10-13 NOTE — Progress Notes (Signed)
[place] PHYSICAL MEDICINE & REHABILITATION     PROGRESS NOTE  Subjective/Complaints:  Pt seen laying in bed this AM.  She slept well overnight.  She feels stronger.  She does complain of cough.   ROS: +Cough. Denies CP, SOB, N/V/D.  Objective: Vital Signs: Blood pressure 131/65, pulse 68, temperature 98 F (36.7 C), temperature source Oral, resp. rate 18, height 5\' 7"  (1.702 m), weight 92.5 kg (203 lb 14.8 oz), SpO2 95 %. No results found. Recent Labs    10/12/17 0526  WBC 11.9*  HGB 12.5  HCT 37.1  PLT 316   Recent Labs    10/12/17 0526  NA 136  K 4.1  CL 100*  GLUCOSE 119*  BUN 27*  CREATININE 1.08*  CALCIUM 9.2   CBG (last 3)  Recent Labs    10/12/17 1635 10/12/17 2050 10/13/17 0656  GLUCAP 237* 295* 100*    Wt Readings from Last 3 Encounters:  10/13/17 92.5 kg (203 lb 14.8 oz)  10/11/17 105.4 kg (232 lb 5.8 oz)  09/28/17 101.2 kg (223 lb)    Physical Exam:  BP 131/65 (BP Location: Left Arm)   Pulse 68   Temp 98 F (36.7 C) (Oral)   Resp 18   Ht 5\' 7"  (1.702 m)   Wt 92.5 kg (203 lb 14.8 oz)   SpO2 95%   BMI 31.94 kg/m  Constitutional: She appears well-developed. Obese.   HENT: Normocephalic and atraumatic.  Eyes: EOM are normal. No discharge.  Cardiovascular: RRR. No JVD.  Respiratory: Effort normal and breath sounds normal.  GI: Bowel sounds are normal. She exhibits no distension.  Musculoskeletal: She exhibits no edema or tenderness.  Neurological: She is alert.  Makes good eye contact with examiner.  Followed simple commands.  Motor: RUE: 4/5 proximal to distal RLE: 4/5 HF, 4/5 KE, ADF/PF  LUE: 5/5 proximal to distal LLE: 5/5 proximal to distal Skin: Skin is warm and dry.  Psychiatric: She has a normal mood and affect. Her behavior is normal.   Assessment/Plan: 1. Functional deficits secondary to metastatic lung cancer with left frontal lobe hemorrhagic metastatic lesion which require 3+ hours per day of interdisciplinary therapy in  a comprehensive inpatient rehab setting. Physiatrist is providing close team supervision and 24 hour management of active medical problems listed below. Physiatrist and rehab team continue to assess barriers to discharge/monitor patient progress toward functional and medical goals.  Function:  Bathing Bathing position   Position: Wheelchair/chair at sink  Bathing parts Body parts bathed by patient: Right arm, Left arm, Chest, Abdomen, Front perineal area, Right upper leg, Left upper leg, Right lower leg, Left lower leg Body parts bathed by helper: Back, Buttocks  Bathing assist Assist Level: Touching or steadying assistance(Pt > 75%)      Upper Body Dressing/Undressing Upper body dressing                    Upper body assist        Lower Body Dressing/Undressing Lower body dressing   What is the patient wearing?: Underwear, Pants Underwear - Performed by patient: Thread/unthread left underwear leg Underwear - Performed by helper: Thread/unthread right underwear leg, Pull underwear up/down Pants- Performed by patient: Thread/unthread left pants leg Pants- Performed by helper: Thread/unthread right pants leg, Pull pants up/down                      Lower body assist Assist for lower body dressing: ([place])  Toileting Toileting   Toileting steps completed by patient: Performs perineal hygiene Toileting steps completed by helper: Adjust clothing prior to toileting, Adjust clothing after toileting Toileting Assistive Devices: Grab bar or rail  Toileting assist Assist level: Touching or steadying assistance (Pt.75%)   Transfers Chair/bed transfer   Chair/bed transfer method: Squat pivot Chair/bed transfer assist level: Maximal assist (Pt 25 - 49%/lift and lower) Chair/bed transfer assistive device: Armrests, Medical sales representative     Max distance: 15 Assist level: Moderate assist (Pt 50 - 74%)   Wheelchair     Max wheelchair distance:  100 Assist Level: Touching or steadying assistance (Pt > 75%)  Cognition Comprehension Comprehension assist level: Follows basic conversation/direction with no assist  Expression Expression assist level: Expresses basic needs/ideas: With no assist  Social Interaction Social Interaction assist level: Interacts appropriately 90% of the time - Needs monitoring or encouragement for participation or interaction.  Problem Solving Problem solving assist level: Solves basic 75 - 89% of the time/requires cueing 10 - 24% of the time  Memory Memory assist level: Recognizes or recalls 75 - 89% of the time/requires cueing 10 - 24% of the time    Medical Problem List and Plan: 1.  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 Decadron added to decreased peri-infarct swelling and taper as needed   Cont CIR 2.  DVT Prophylaxis/Anticoagulation: SCDs. Monitor for any signs of DVT.    Vascular study performed, awaiting results 3. Pain Management: Hydrocodone and Neurontin as needed 4. Mood: Xanax 0.25 mg daily as needed 5. Neuropsych: This patient is capable of making decisions on her own behalf. 6. Skin/Wound Care: Routine skin checks 7. Fluids/Electrolytes/Nutrition: Routine I&O's 8. Seizure prophylaxis. Keppra 500 mg twice a day 9. Diabetes mellitus and peripheral neuropathy. Hemoglobin A1c 8.1. NovoLog 4 units 3 times a day, Lantus insulin 40 units daily, Glucophage 500 mg daily, Tradjenta 5 mg daily. Check blood sugars before meals and at bedtime   Monitor with increased mobility, remains extremely labile.  Will monitor for trend 10. Hypertension. Cozaar 25 mg daily, Toprol 25 mg daily, Norvasc 5 mg twice a day,Hydralazine 25 mg twice daily.    Relatively controlled on 11/21 11. COPD with tobacco abuse. Continue inhalers. Provide counseling 12. CKD stage III. Creatinine baseline 1.28.    Cr 1.08 on 11/20   Encourage fluids   Cont to  monitor 13. Hyponatremia. Resolved   Na+ 136 on 11/20 14. Constipation. Laxative assistance 15. Hyperlipidemia. Crestor 16. Hypoalbuminemia   Supplement initiated on 11/20 17. Leukocytosis   WBCs 11.9 on 11/20, likely secondary to steroids   Cont to monitor   Afebrile 18. Cough   Possibly related to lung cancer, however patient states symptoms are new.   Chest x-ray ordered  LOS (Days) 2 A FACE TO FACE EVALUATION WAS PERFORMED  Abbie Berling Lorie Phenix 10/13/2017 8:37 AM

## 2017-10-13 NOTE — Progress Notes (Signed)
Speech Language Pathology Daily Session Note  Patient Details  Name: [name] MRN: [id number] Date of Birth: 1946/09/11  Today's Date: 10/13/2017 SLP Individual Time: 0730-0825 SLP Individual Time Calculation (min): 55 min  Short Term Goals: Week 1: SLP Short Term Goal 1 (Week 1): Pt will recall new, daily information with supervision verbal cues for use of external aids.   SLP Short Term Goal 2 (Week 1): Pt will complete semi-complex tasks with supervision verbal cues for functional problem solving.   SLP Short Term Goal 3 (Week 1): Pt will recognize and correct errors in the moment during functional tasks with supervision verbal cues.    Skilled Therapeutic Interventions: Skilled treatment session focused on cognitive goals. SLP facilitated session by providing Min A verbal cues for problem solving during a complex money management task, however, patient was Mod I for basic money management. Patient also recalled current medications with Min A verbal cues. Patient left upright in bed with all needs within reach. Continue with current plan of care.      Function:   Cognition Comprehension Comprehension assist level: Follows basic conversation/direction with no assist  Expression   Expression assist level: Expresses basic needs/ideas: With no assist  Social Interaction Social Interaction assist level: Interacts appropriately 90% of the time - Needs monitoring or encouragement for participation or interaction.  Problem Solving Problem solving assist level: Solves basic 90% of the time/requires cueing < 10% of the time  Memory Memory assist level: Recognizes or recalls 75 - 89% of the time/requires cueing 10 - 24% of the time    Pain No/Denies Pain   Therapy/Group: Individual Therapy  Ajdin Macke 10/13/2017, 9:39 AM

## 2017-10-14 DIAGNOSIS — E669 Obesity, unspecified: Secondary | ICD-10-CM

## 2017-10-14 DIAGNOSIS — E1169 Type 2 diabetes mellitus with other specified complication: Secondary | ICD-10-CM

## 2017-10-14 DIAGNOSIS — R7309 Other abnormal glucose: Secondary | ICD-10-CM

## 2017-10-14 LAB — GLUCOSE, CAPILLARY
GLUCOSE-CAPILLARY: 152 mg/dL — AB (ref 65–99)
Glucose-Capillary: 79 mg/dL (ref 65–99)
Glucose-Capillary: 233 mg/dL — ABNORMAL HIGH (ref 65–99)
Glucose-Capillary: 173 mg/dL — ABNORMAL HIGH (ref 65–99)

## 2017-10-14 NOTE — Progress Notes (Signed)
[place] PHYSICAL MEDICINE & REHABILITATION     PROGRESS NOTE  Subjective/Complaints:  Patient seen lying in bed this morning. She slept well overnight. She wants to know if she have her anxiety medications increased to twice a day.   ROS: Denies CP, SOB, N/V/D.  Objective: Vital Signs: Blood pressure (!) 130/56, pulse (!) 58, temperature 98 F (36.7 C), temperature source Oral, resp. rate 18, height 5\' 7"  (1.702 m), weight 92.1 kg (203 lb), SpO2 98 %. Dg Chest 2 View  Result Date: 10/13/2017 CLINICAL DATA:  Cough, shortness of breath, weakness EXAM: CHEST  2 VIEW COMPARISON:  09/24/2017 FINDINGS: Normal heart size, mediastinal contours, and pulmonary vascularity. Atherosclerotic calcification aorta. Persistent visualization of a RIGHT apex mass 3.1 x 3.0 cm. Lungs otherwise clear. No pulmonary infiltrate, pleural effusion or pneumothorax. Scattered endplate spur formation thoracic spine. IMPRESSION: Persistent visualization of a RIGHT apex mass. No acute abnormalities. Electronically Signed   By: Lavonia Dana M.D.   On: 10/13/2017 09:39   Recent Labs    10/12/17 0526  WBC 11.9*  HGB 12.5  HCT 37.1  PLT 316   Recent Labs    10/12/17 0526  NA 136  K 4.1  CL 100*  GLUCOSE 119*  BUN 27*  CREATININE 1.08*  CALCIUM 9.2   CBG (last 3)  Recent Labs    10/13/17 1647 10/13/17 2123 10/14/17 0649  GLUCAP 162* 233* 79    Wt Readings from Last 3 Encounters:  10/14/17 92.1 kg (203 lb)  10/11/17 105.4 kg (232 lb 5.8 oz)  09/28/17 101.2 kg (223 lb)    Physical Exam:  BP (!) 130/56 (BP Location: Left Arm)   Pulse (!) 58   Temp 98 F (36.7 C) (Oral)   Resp 18   Ht 5\' 7"  (1.702 m)   Wt 92.1 kg (203 lb)   SpO2 98%   BMI 31.79 kg/m  Constitutional: She appears well-developed. Obese.   HENT: Normocephalic and atraumatic.  Eyes: EOM are normal. No discharge.  Cardiovascular: RRR. No JVD.  Respiratory: Effort normal and breath sounds normal.  GI: Bowel sounds are normal.  She exhibits no distension.  Musculoskeletal: She exhibits no edema or tenderness.  Neurological: She is alert.  Makes good eye contact with examiner.  Followed simple commands.  Motor: RUE: 4/5 proximal to distal RLE: 4/5 HF, 4/5 KE, ADF/PF (stable) LUE: 5/5 proximal to distal LLE: 5/5 proximal to distal Skin: Skin is warm and dry.  Psychiatric: She has a normal mood and affect. Her behavior is normal.   Assessment/Plan: 1. Functional deficits secondary to metastatic lung cancer with left frontal lobe hemorrhagic metastatic lesion which require 3+ hours per day of interdisciplinary therapy in a comprehensive inpatient rehab setting. Physiatrist is providing close team supervision and 24 hour management of active medical problems listed below. Physiatrist and rehab team continue to assess barriers to discharge/monitor patient progress toward functional and medical goals.  Function:  Bathing Bathing position   Position: Shower  Bathing parts Body parts bathed by patient: Right arm, Left arm, Chest, Abdomen, Front perineal area, Right upper leg, Left upper leg, Left lower leg Body parts bathed by helper: Buttocks, Right lower leg, Back  Bathing assist Assist Level: Touching or steadying assistance(Pt > 75%)      Upper Body Dressing/Undressing Upper body dressing   What is the patient wearing?: Pull over shirt/dress     Pull over shirt/dress - Perfomed by patient: Thread/unthread right sleeve, Thread/unthread left sleeve, Put head through  opening, Pull shirt over trunk          Upper body assist Assist Level: Supervision or verbal cues      Lower Body Dressing/Undressing Lower body dressing   What is the patient wearing?: Underwear, Pants Underwear - Performed by patient: Thread/unthread right underwear leg, Thread/unthread left underwear leg Underwear - Performed by helper: Pull underwear up/down Pants- Performed by patient: Thread/unthread left pants leg Pants- Performed  by helper: Thread/unthread right pants leg, Pull pants up/down                      Lower body assist Assist for lower body dressing: (ModA)      Toileting Toileting   Toileting steps completed by patient: Performs perineal hygiene Toileting steps completed by helper: Adjust clothing prior to toileting, Adjust clothing after toileting Toileting Assistive Devices: Grab bar or rail  Toileting assist Assist level: Touching or steadying assistance (Pt.75%)   Transfers Chair/bed transfer   Chair/bed transfer method: Squat pivot Chair/bed transfer assist level: Maximal assist (Pt 25 - 49%/lift and lower) Chair/bed transfer assistive device: Armrests, Medical sales representative     Max distance: 15 Assist level: Moderate assist (Pt 50 - 74%)   Wheelchair     Max wheelchair distance: 100 Assist Level: Touching or steadying assistance (Pt > 75%)  Cognition Comprehension Comprehension assist level: Follows basic conversation/direction with no assist  Expression Expression assist level: Expresses basic needs/ideas: With no assist  Social Interaction Social Interaction assist level: Interacts appropriately 90% of the time - Needs monitoring or encouragement for participation or interaction.  Problem Solving Problem solving assist level: Solves basic problems with no assist  Memory Memory assist level: Recognizes or recalls 90% of the time/requires cueing < 10% of the time    Medical Problem List and Plan: 1.  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 Decadron added to decreased peri-infarct swelling and taper as needed   Cont CIR 2.  DVT Prophylaxis/Anticoagulation: SCDs. Monitor for any signs of DVT.    Vascular study performed, awaiting results 3. Pain Management: Hydrocodone and Neurontin as needed 4. Mood: Xanax 0.25 mg daily as needed 5. Neuropsych: This patient is capable of making decisions on her  own behalf. 6. Skin/Wound Care: Routine skin checks 7. Fluids/Electrolytes/Nutrition: Routine I&O's 8. Seizure prophylaxis. Keppra 500 mg twice a day 9. Diabetes mellitus and peripheral neuropathy. Hemoglobin A1c 8.1. NovoLog 4 units 3 times a day, Lantus insulin 40 units daily, Glucophage 500 mg daily, Tradjenta 5 mg daily. Check blood sugars before meals and at bedtime   Remains labile, but overall improving on 11/22 10. Hypertension. Cozaar 25 mg daily, Toprol 25 mg daily, Norvasc 5 mg twice a day,Hydralazine 25 mg twice daily.    Controlled on 11/22 11. COPD with tobacco abuse. Continue inhalers. Provide counseling 12. CKD stage III. Creatinine baseline 1.28.    Cr 1.08 on 11/20   Labs ordered for tomorrow   Encourage fluids   Cont to monitor 13. Hyponatremia. Resolved   Na+ 136 on 11/20 14. Constipation. Laxative assistance 15. Hyperlipidemia. Crestor 16. Hypoalbuminemia   Supplement initiated on 11/20 17. Leukocytosis   WBCs 11.9 on 11/20, likely secondary to steroids   Labs ordered for tomorrow   Cont to monitor   Afebrile 18. Cough   Improving   Chest x-ray reviewed, showing mass, but no acute changes  LOS (Days) 3 A FACE TO FACE EVALUATION  WAS PERFORMED  Remas Sobel Lorie Phenix 10/14/2017 8:16 AM

## 2017-10-15 ENCOUNTER — Inpatient Hospital Stay (HOSPITAL_COMMUNITY): Payer: Medicare HMO | Admitting: Speech Pathology

## 2017-10-15 ENCOUNTER — Inpatient Hospital Stay (HOSPITAL_COMMUNITY): Payer: Medicare HMO | Admitting: Physical Therapy

## 2017-10-15 ENCOUNTER — Inpatient Hospital Stay (HOSPITAL_COMMUNITY): Payer: Medicare HMO

## 2017-10-15 ENCOUNTER — Inpatient Hospital Stay (HOSPITAL_COMMUNITY): Payer: Medicare HMO | Admitting: Occupational Therapy

## 2017-10-15 LAB — CBC WITH DIFFERENTIAL/PLATELET
BAND NEUTROPHILS: 0 %
BASOS ABS: 0 10*3/uL (ref 0.0–0.1)
BASOS PCT: 0 %
Blasts: 0 %
EOS ABS: 0.2 10*3/uL (ref 0.0–0.7)
Eosinophils Relative: 2 %
HCT: 41.4 % (ref 36.0–46.0)
HEMOGLOBIN: 13.7 g/dL (ref 12.0–15.0)
LYMPHS PCT: 39 %
Lymphs Abs: 4.4 10*3/uL — ABNORMAL HIGH (ref 0.7–4.0)
MCH: 29.3 pg (ref 26.0–34.0)
MCHC: 33.1 g/dL (ref 30.0–36.0)
MCV: 88.7 fL (ref 78.0–100.0)
METAMYELOCYTES PCT: 2 %
MONO ABS: 1 10*3/uL (ref 0.1–1.0)
Monocytes Relative: 9 %
Myelocytes: 0 %
NEUTROS ABS: 5.7 10*3/uL (ref 1.7–7.7)
Neutrophils Relative %: 48 %
OTHER: 0 %
PROMYELOCYTES ABS: 0 %
Platelets: 385 10*3/uL (ref 150–400)
RBC: 4.67 MIL/uL (ref 3.87–5.11)
RDW: 13.4 % (ref 11.5–15.5)
WBC: 11.3 10*3/uL — ABNORMAL HIGH (ref 4.0–10.5)
nRBC: 0 /100 WBC

## 2017-10-15 LAB — BASIC METABOLIC PANEL
ANION GAP: 9 (ref 5–15)
BUN: 29 mg/dL — AB (ref 6–20)
CALCIUM: 9.6 mg/dL (ref 8.9–10.3)
CO2: 27 mmol/L (ref 22–32)
Chloride: 100 mmol/L — ABNORMAL LOW (ref 101–111)
Creatinine, Ser: 1.13 mg/dL — ABNORMAL HIGH (ref 0.44–1.00)
GFR calc Af Amer: 56 mL/min — ABNORMAL LOW (ref >60)
GFR, EST NON AFRICAN AMERICAN: 48 mL/min — AB (ref >60)
GLUCOSE: 101 mg/dL — AB (ref 65–99)
Potassium: 3.7 mmol/L (ref 3.5–5.1)
Sodium: 136 mmol/L (ref 135–145)

## 2017-10-15 LAB — GLUCOSE, CAPILLARY
GLUCOSE-CAPILLARY: 84 mg/dL (ref 65–99)
GLUCOSE-CAPILLARY: 217 mg/dL — AB (ref 65–99)
GLUCOSE-CAPILLARY: 251 mg/dL — AB (ref 65–99)
Glucose-Capillary: 222 mg/dL — ABNORMAL HIGH (ref 65–99)

## 2017-10-15 MED ORDER — DEXAMETHASONE 2 MG PO TABS
2.0000 mg | ORAL_TABLET | Freq: Every day | ORAL | Status: DC
  Administered 2017-10-16 – 2017-10-18 (×3): 2 mg via ORAL
  Filled 2017-10-15 (×4): qty 1

## 2017-10-15 MED ORDER — ALPRAZOLAM 0.25 MG PO TABS
0.2500 mg | ORAL_TABLET | Freq: Three times a day (TID) | ORAL | Status: DC | PRN
  Administered 2017-10-15 – 2017-10-22 (×11): 0.25 mg via ORAL
  Filled 2017-10-15 (×12): qty 1

## 2017-10-15 MED ORDER — INSULIN ASPART 100 UNIT/ML ~~LOC~~ SOLN
7.0000 [IU] | Freq: Three times a day (TID) | SUBCUTANEOUS | Status: DC
  Administered 2017-10-15 – 2017-10-20 (×15): 7 [IU] via SUBCUTANEOUS

## 2017-10-15 MED ORDER — CITALOPRAM HYDROBROMIDE 10 MG PO TABS
5.0000 mg | ORAL_TABLET | Freq: Every day | ORAL | Status: DC
  Administered 2017-10-15 – 2017-10-23 (×9): 5 mg via ORAL
  Filled 2017-10-15 (×9): qty 1

## 2017-10-15 NOTE — Progress Notes (Signed)
Occupational Therapy Session Note  Patient Details  Name: [name] MRN: [id number] Date of Birth: 1946/10/18  Today's Date: 10/15/2017 OT Individual Time: 0800-0830 OT Individual Time Calculation (min): 30 min    Short Term Goals: Week 1:  OT Short Term Goal 1 (Week 1): Pt will complete toilet transfer with MinA.  OT Short Term Goal 2 (Week 1): Pt will complete 2/3 parts of LB dressing. OT Short Term Goal 3 (Week 1): Pt will demonstrate hemi techniques for completing UB dressing with min verbal cues.   Skilled Therapeutic Interventions/Progress Updates:    1:1. Pt requesting to shower this morning. Pt transfers with CGA fading to supervision with VC for safety awareness stand pivot EOB<>w/c<>TTB with grab bar. Pt bathes at sit to stand level with supervision and VC for equal weight bearing on B feet when washing buttock. Pt dons hospital gown and underwear with supervision and VC for hemi dressing. Exited sessio nwit pt supine in bed with call light in reach and all needs met  Therapy Documentation Precautions:  Precautions Precautions: Fall Restrictions Weight Bearing Restrictions: No  See Function Navigator for Current Functional Status.   Therapy/Group: Individual Therapy  Tonny Branch 10/15/2017, 8:28 AM

## 2017-10-15 NOTE — Progress Notes (Signed)
[place] PHYSICAL MEDICINE & REHABILITATION     PROGRESS NOTE  Subjective/Complaints:  Pt seen laying in bed this AM.  She states she did not sleep well overnight and would like medications to help her sleep.   ROS: Denies CP, SOB, N/V/D.  Objective: Vital Signs: Blood pressure 132/69, pulse 64, temperature 97.9 F (36.6 C), temperature source Oral, resp. rate 18, height 5\' 7"  (1.702 m), weight 92.1 kg (203 lb), SpO2 97 %. Dg Chest 2 View  Result Date: 10/13/2017 CLINICAL DATA:  Cough, shortness of breath, weakness EXAM: CHEST  2 VIEW COMPARISON:  09/24/2017 FINDINGS: Normal heart size, mediastinal contours, and pulmonary vascularity. Atherosclerotic calcification aorta. Persistent visualization of a RIGHT apex mass 3.1 x 3.0 cm. Lungs otherwise clear. No pulmonary infiltrate, pleural effusion or pneumothorax. Scattered endplate spur formation thoracic spine. IMPRESSION: Persistent visualization of a RIGHT apex mass. No acute abnormalities. Electronically Signed   By: Lavonia Dana M.D.   On: 10/13/2017 09:39   No results for input(s): WBC, HGB, HCT, PLT in the last 72 hours. No results for input(s): NA, K, CL, GLUCOSE, BUN, CREATININE, CALCIUM in the last 72 hours.  Invalid input(s): CO CBG (last 3)  Recent Labs    10/14/17 1657 10/14/17 2145 10/15/17 0602  GLUCAP 233* 173* 84    Wt Readings from Last 3 Encounters:  10/14/17 92.1 kg (203 lb)  10/11/17 105.4 kg (232 lb 5.8 oz)  09/28/17 101.2 kg (223 lb)    Physical Exam:  BP 132/69   Pulse 64   Temp 97.9 F (36.6 C) (Oral)   Resp 18   Ht 5\' 7"  (1.702 m)   Wt 92.1 kg (203 lb)   SpO2 97%   BMI 31.79 kg/m  Constitutional: She appears well-developed. Obese.   HENT: Normocephalic and atraumatic.  Eyes: EOM are normal. No discharge.  Cardiovascular: RRR. No JVD.  Respiratory: Effort normal and breath sounds normal.  GI: Bowel sounds are normal. She exhibits no distension.  Musculoskeletal: She exhibits no edema or  tenderness.  Neurological: She is alert.  Makes good eye contact with examiner.  Followed simple commands.  Motor: RUE: 4/5 proximal to distal RLE: 4/5 HF, 4/5 KE, ADF/PF (improving) LUE: 5/5 proximal to distal LLE: 5/5 proximal to distal Skin: Skin is warm and dry.  Psychiatric: She has a normal mood and affect. Her behavior is normal.   Assessment/Plan: 1. Functional deficits secondary to metastatic lung cancer with left frontal lobe hemorrhagic metastatic lesion which require 3+ hours per day of interdisciplinary therapy in a comprehensive inpatient rehab setting. Physiatrist is providing close team supervision and 24 hour management of active medical problems listed below. Physiatrist and rehab team continue to assess barriers to discharge/monitor patient progress toward functional and medical goals.  Function:  Bathing Bathing position   Position: Shower  Bathing parts Body parts bathed by patient: Right arm, Left arm, Chest, Abdomen, Front perineal area, Right upper leg, Left upper leg, Left lower leg Body parts bathed by helper: Buttocks, Right lower leg, Back  Bathing assist Assist Level: Touching or steadying assistance(Pt > 75%)      Upper Body Dressing/Undressing Upper body dressing   What is the patient wearing?: Pull over shirt/dress     Pull over shirt/dress - Perfomed by patient: Thread/unthread right sleeve, Thread/unthread left sleeve, Put head through opening, Pull shirt over trunk          Upper body assist Assist Level: Supervision or verbal cues  Lower Body Dressing/Undressing Lower body dressing   What is the patient wearing?: Underwear, Pants Underwear - Performed by patient: Thread/unthread right underwear leg, Thread/unthread left underwear leg Underwear - Performed by helper: Pull underwear up/down Pants- Performed by patient: Thread/unthread left pants leg Pants- Performed by helper: Thread/unthread right pants leg, Pull pants up/down                       Lower body assist Assist for lower body dressing: (ModA)      Toileting Toileting   Toileting steps completed by patient: Performs perineal hygiene Toileting steps completed by helper: Adjust clothing prior to toileting, Adjust clothing after toileting Toileting Assistive Devices: Grab bar or rail  Toileting assist Assist level: Touching or steadying assistance (Pt.75%)   Transfers Chair/bed transfer   Chair/bed transfer method: Squat pivot Chair/bed transfer assist level: Maximal assist (Pt 25 - 49%/lift and lower) Chair/bed transfer assistive device: Armrests, Medical sales representative     Max distance: 15 Assist level: Moderate assist (Pt 50 - 74%)   Wheelchair     Max wheelchair distance: 100 Assist Level: Touching or steadying assistance (Pt > 75%)  Cognition Comprehension Comprehension assist level: Follows basic conversation/direction with no assist  Expression Expression assist level: Expresses basic needs/ideas: With no assist  Social Interaction Social Interaction assist level: Interacts appropriately 90% of the time - Needs monitoring or encouragement for participation or interaction.  Problem Solving Problem solving assist level: Solves basic problems with no assist  Memory Memory assist level: Recognizes or recalls 90% of the time/requires cueing < 10% of the time    Medical Problem List and Plan: 1.  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 Decadron added to decreased peri-infarct swelling   Cont CIR   Due to improvement, decreased decadron to 2 mg on 11/24 2.  DVT Prophylaxis/Anticoagulation: SCDs. Monitor for any signs of DVT.    ?Vascular study performed, awaiting results 3. Pain Management: Hydrocodone and Neurontin as needed 4. Mood: Xanax 0.25 mg daily as needed 5. Neuropsych: This patient is capable of making decisions on her own behalf. 6. Skin/Wound  Care: Routine skin checks 7. Fluids/Electrolytes/Nutrition: Routine I&O's 8. Seizure prophylaxis. Keppra 500 mg twice a day 9. Diabetes mellitus and peripheral neuropathy. Hemoglobin A1c 8.1.  Lantus insulin 40 units daily, Glucophage 500 mg daily. Check blood sugars before meals and at bedtime   Tradjenta 5 mg daily d/ced on 11/23.    NovoLog 4 units 3 times a day, increased to 7 units on 11/23   10. Hypertension. Cozaar 25 mg daily, Toprol 25 mg daily, Norvasc 5 mg twice a day,Hydralazine 25 mg twice daily.    Controlled on 11/23 11. COPD with tobacco abuse. Continue inhalers. Provide counseling 12. CKD stage III. Creatinine baseline 1.28.    Cr 1.08 on 11/20   Labs pending   Encourage fluids   Cont to monitor 13. Hyponatremia. Resolved   Na+ 136 on 11/20 14. Constipation. Laxative assistance 15. Hyperlipidemia. Crestor 16. Hypoalbuminemia   Supplement initiated on 11/20 17. Leukocytosis   WBCs 11.9 on 11/20, likely secondary to steroids   Labs pending   Cont to monitor   Afebrile 18. Cough   Improving   Chest x-ray reviewed, showing mass, but no acute changes  LOS (Days) 4 A FACE TO FACE EVALUATION WAS PERFORMED  Ankit Lorie Phenix 10/15/2017 8:03 AM

## 2017-10-15 NOTE — Progress Notes (Signed)
Physical Therapy Note  Patient Details  Name: [name] MRN: [id number] Date of Birth: 01-03-1946 Today's Date: 10/15/2017    Time: [phone] 60 minutes  1:1 No c/o pain.  Pt performs gait with RW 50' x 3 with min guard, cues for Rt foot clearance when fatigued.  Furniture transfer to low couch with min A, cues for UE placement.  Standing tap ups with alternating LEs with focus on Rt LE strength and stability.  Side stepping with min A HHA with focus on Rt hip strengthening.  Gait without AD with min A HHA 2 x 30'.  sit to stand repetitions 2 x 10 with ball squeeze without UE support for core and hip strengthening. Pt is pleased with progress.   Cuthbert Turton 10/15/2017, 10:31 AM

## 2017-10-15 NOTE — Progress Notes (Signed)
Occupational Therapy Session Note  Patient Details  Name: [name] MRN: [id number] Date of Birth: 02/09/1946  Today's Date: 10/15/2017 OT Individual Time: 1116-1200 OT Individual Time Calculation (min): 44 min    Short Term Goals: Week 1:  OT Short Term Goal 1 (Week 1): Pt will complete toilet transfer with MinA.  OT Short Term Goal 2 (Week 1): Pt will complete 2/3 parts of LB dressing. OT Short Term Goal 3 (Week 1): Pt will demonstrate hemi techniques for completing UB dressing with min verbal cues.   Skilled Therapeutic Interventions/Progress Updates:    Pt completed donning shoes EOB with supervision to start session.  She then completed functional mobility down to the [facility] gym with use of the RW and min assist.  Frequent dragging of the right foot was noted during mobility, requiring mod demonstrational cueing for more awareness.   Once in the [facility] gym had pt work on RUE coordination with use of the [employer].  Pt completed several intervals in standing for 1 min each, with close supervision and use of the walker for balance.  She was able to use the RUE to push out lights with reaction time from 1.8 seconds average down to 1.26.  On intervals of 2 second light duration she was able to get from 75-90% of them using the right hand.  Had pt ambulate back to the room next.  Issued change for pt to work on [employer] coordination with the right hand for translation of palm to fingertips and back.  Pt left in wheelchair at end of session for call button and phone in reach.     Therapy Documentation Precautions:  Precautions Precautions: Fall Restrictions Weight Bearing Restrictions: No   Pain: Pain Assessment Pain Assessment: No/denies pain ADL: See Function Navigator for Current Functional Status.   Therapy/Group: Individual Therapy  Ilan Kahrs  OTR/L 10/15/2017, 12:50 PM

## 2017-10-15 NOTE — Plan of Care (Signed)
Progressing

## 2017-10-15 NOTE — Patient Care Conference (Signed)
Inpatient RehabilitationTeam Conference and Plan of Care Update Date: 10/13/2017   Time: 10:45 AM    Patient Name: [name]      Medical Record Number: [id number]  Date of Birth: 1945-12-10 Sex: Female         Room/Bed: 4W11C/4W11C-01 Payor Info: Payor: A[employer] MEDICARE / Plan: AET[employer] MEDICARE HMO/PPO / Product Type: *No Product type* /    Admitting Diagnosis: Lung Ca with Lung mets to the brain  Admit Date/Time:  10/11/2017  4:45 PM Admission Comments: No comment available   Primary Diagnosis:  <principal problem not specified> Principal Problem: <principal problem not specified>  Patient Active Problem List   Diagnosis Date Noted  . Diabetes mellitus type 2 in obese ([place])   . Labile blood glucose   . Cough   . Hypoalbuminemia due to protein-calorie malnutrition ([place])   . Leukocytosis   . Metastatic lung cancer (metastasis from lung to other site) ([place]) 10/11/2017  . Brain metastasis ([place])   . Seizure prophylaxis   . Hyperlipidemia   . Slow transit constipation   . Hyponatremia   . Stage 3 chronic kidney disease ([place])   . Type 2 diabetes mellitus with peripheral neuropathy ([facility])   . Benign essential HTN   . Tobacco abuse   . 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 GERD (gastroesophageal reflux disease) 08/16/2017  . Lung mass 08/16/2017  . Shortness of breath 07/05/2017    Class: Acute  . Vaginal odor 02/10/2017  . Dysuria 02/10/2017  . Osteoarthritis of spine with radiculopathy, cervical region 12/25/2016  . COPD (chronic obstructive pulmonary disease) ([place]) 10/11/2015  . Orthostatic hypotension 06/18/2013  . Numbness and tingling in right hand 11/10/2011    Class: Acute  . Hypertension 11/10/2011  . Cervical disc disease 11/10/2011    Class: Chronic  . DM II (diabetes mellitus, type II), controlled ([place]) 11/10/2011    Class: Chronic    Expected Discharge Date: Expected Discharge Date: 10/28/17  Team Members Present: Physician leading conference: Dr.  Delice Lesch Social Worker Present: Lennart Pall, LCSW Nurse Present: Other (comment)(Elena Grecu, RN) PT Present: Roderic Ovens, PT OT Present: Meriel Pica, OT SLP Present: Charolett Bumpers, SLP PPS Coordinator present : Daiva Nakayama, RN, CRRN     Current Status/Progress Goal Weekly Team Focus  Medical   Right hemiparesis secondary to metastatic lung cancer with left frontal lobe hemorrhagic metastatic lesion. Decadron added to decreased peri-infarct swelling  Improve mobility, DM/HTN, leukocytosis, cough  See above   Bowel/Bladder   cont b/b; 11/19  maintain  assess for changes in continence q shift   Swallow/Nutrition/ Hydration             ADL's   ModA LB ADLs, MinA UB ADLs; ModA for funcitonal mobility transfers (to/from toilet); MaxA toileting  supervision at discharge  funcitonal transfers; UB/LB dressing; activity tolerance; RUE NMR   Mobility   mod/max A gait and transfers  supervision transfers and gait, min A stairs  NMR, activity tolerance, transfers   Communication             Safety/Cognition/ Behavioral Observations  Supervision-Min A   mod I   semi-complex problem solving, memory and awareness    Pain   denies  <3  assess pain q shift and prn   Skin   cdi  maintain  assess q shift and prn    Rehab Goals Patient on target to meet rehab goals: Yes *See Care Plan and progress notes for long and short-term  goals.     Barriers to Discharge  Current Status/Progress Possible Resolutions Date Resolved   Physician    Medical stability     See above  Therapies, optimize DM/HTN meds, follow labs, CXR ordered      Nursing                  PT                    OT                  SLP                SW                Discharge Planning/Teaching Needs:  Plan to d/c home with spouse and family able to provide 24/7 assistance.  Teaching to be planned closer to d/c.   Team Discussion:  Checking CXR due to cough and WBC.  Currently mod assist overall and  significant right arm weakness.  ST feels they will be addressing higher level cognition.  PT/ OT setting supervision goals.  Revisions to Treatment Plan:  None    Continued Need for Acute Rehabilitation Level of Care: The patient requires daily medical management by a physician with specialized training in physical medicine and rehabilitation for the following conditions: Daily direction of a multidisciplinary physical rehabilitation program to ensure safe treatment while eliciting the highest outcome that is of practical value to the patient.: Yes Daily medical management of patient stability for increased activity during participation in an intensive rehabilitation regime.: Yes Daily analysis of laboratory values and/or radiology reports with any subsequent need for medication adjustment of medical intervention for : Diabetes problems;Pulmonary problems;Blood pressure problems;Other  Jearlene Bridwell, [place] 10/15/2017, 3:14 PM

## 2017-10-15 NOTE — Progress Notes (Signed)
Speech Language Pathology Daily Session Note  Patient Details  Name: [name] MRN: [id number] Date of Birth: [birth year]  Today's Date: 10/15/2017 SLP Individual Time: 1355-1443 SLP Individual Time Calculation (min): 48 min  Short Term Goals: Week 1: SLP Short Term Goal 1 (Week 1): Pt will recall new, daily information with supervision verbal cues for use of external aids.   SLP Short Term Goal 2 (Week 1): Pt will complete semi-complex tasks with supervision verbal cues for functional problem solving.   SLP Short Term Goal 3 (Week 1): Pt will recognize and correct errors in the moment during functional tasks with supervision verbal cues.    Skilled Therapeutic Interventions: Skilled treatment session focused on cognitive goals. Upon arrival, patient was awake while upright in the chair but appeared lethargic. Patient agreeable to participate in treatment session with encouragement despite fatigue. Patient organized a BID pill box with extra time and overall Mod I. Patient transferred back to bed at end of session. Patient left with alarm on and all needs within reach. Continue with current plan of care.      Function:   Cognition Comprehension Comprehension assist level: Follows basic conversation/direction with no assist  Expression   Expression assist level: Expresses basic needs/ideas: With no assist  Social Interaction Social Interaction assist level: Interacts appropriately 90% of the time - Needs monitoring or encouragement for participation or interaction.  Problem Solving Problem solving assist level: Solves complex 90% of the time/cues < 10% of the time  Memory Memory assist level: More than reasonable amount of time    Pain Pain Assessment Pain Assessment: No/denies pain  Therapy/Group: Individual Therapy  Tyffany Waldrop 10/15/2017, 3:04 PM

## 2017-10-15 NOTE — Progress Notes (Signed)
Social Work Patient ID: [name], female   DOB: [birth year], 71 y.o.   MRN: [id number]   Able to meet with pt and her husband today to review team conference.  They are both aware and agreeable with targeted d/c date of 12/6 and supervision goals.  Pt reports, "I did really good today."  Pleased with gains being made.  Will continue to follow.  Katera Rybka, LCSW

## 2017-10-16 ENCOUNTER — Inpatient Hospital Stay (HOSPITAL_COMMUNITY): Payer: Medicare HMO | Admitting: Speech Pathology

## 2017-10-16 ENCOUNTER — Inpatient Hospital Stay (HOSPITAL_COMMUNITY): Payer: Medicare HMO | Admitting: Occupational Therapy

## 2017-10-16 ENCOUNTER — Inpatient Hospital Stay (HOSPITAL_COMMUNITY): Payer: Medicare HMO

## 2017-10-16 DIAGNOSIS — F5102 Adjustment insomnia: Secondary | ICD-10-CM

## 2017-10-16 LAB — GLUCOSE, CAPILLARY
GLUCOSE-CAPILLARY: 95 mg/dL (ref 65–99)
GLUCOSE-CAPILLARY: 254 mg/dL — AB (ref 65–99)
Glucose-Capillary: 138 mg/dL — ABNORMAL HIGH (ref 65–99)
Glucose-Capillary: 111 mg/dL — ABNORMAL HIGH (ref 65–99)

## 2017-10-16 MED ORDER — TRAZODONE HCL 50 MG PO TABS
50.0000 mg | ORAL_TABLET | Freq: Every day | ORAL | Status: DC
  Administered 2017-10-16 – 2017-10-22 (×7): 50 mg via ORAL
  Filled 2017-10-16 (×7): qty 1

## 2017-10-16 NOTE — Plan of Care (Signed)
  Progressing Consults Diabetes Guidelines if Diabetic/Glucose > 140 Description If diabetic or lab glucose is > 140 mg/dl - Initiate Diabetes/Hyperglycemia Guidelines & Document Interventions  10/16/2017 2259 - Progressing by Cornell Barman, RN [facility] STROKE PATIENT EDUCATION Description See Patient Education module for education specifics  10/16/2017 2259 - Progressing by Cornell Barman, RN RH BOWEL ELIMINATION RH STG MANAGE BOWEL WITH ASSISTANCE Description STG Manage Bowel with Min. Assistance.  10/16/2017 2259 - Progressing by Cornell Barman, RN RH STG MANAGE BOWEL W/MEDICATION W/ASSISTANCE Description STG Manage Bowel with Medication with Min.Assistance.  10/16/2017 2259 - Progressing by Cornell Barman, RN RH BLADDER ELIMINATION RH STG MANAGE BLADDER WITH ASSISTANCE Description STG Manage Bladder With Min.Assistance  10/16/2017 2259 - Progressing by Cornell Barman, RN RH SKIN INTEGRITY RH STG SKIN FREE OF INFECTION/BREAKDOWN Description With min. Assist.  10/16/2017 2259 - Progressing by Cornell Barman, RN RH STG MAINTAIN SKIN INTEGRITY WITH ASSISTANCE Description STG Maintain Skin Integrity With Min.Assistance.  10/16/2017 2259 - Progressing by Cornell Barman, RN RH SAFETY RH STG ADHERE TO SAFETY PRECAUTIONS W/ASSISTANCE/DEVICE Description STG Adhere to Safety Precautions With Mod.Assistance/Device.  10/16/2017 2259 - Progressing by Cornell Barman, RN RH PAIN MANAGEMENT RH STG PAIN MANAGED AT OR BELOW PT'S PAIN GOAL Description Less than 3,on 1 to 10 scale  10/16/2017 2259 - Progressing by Cornell Barman, RN RH KNOWLEDGE DEFICIT RH STG INCREASE KNOWLEDGE OF DIABETES 10/16/2017 2259 - Progressing by Cornell Barman, RN RH STG INCREASE KNOWLEDGE OF HYPERTENSION 10/16/2017 2259 - Progressing by Cornell Barman, RN

## 2017-10-16 NOTE — Progress Notes (Signed)
Occupational Therapy Session Note  Patient Details  Name: [name] MRN: [id number] Date of Birth: [birth year]  Today's Date: 10/16/2017 OT Individual Time: 0702-0757 OT Individual Time Calculation (min): 55 min    Short Term Goals: Week 1:  OT Short Term Goal 1 (Week 1): Pt will complete toilet transfer with MinA.  OT Short Term Goal 2 (Week 1): Pt will complete 2/3 parts of LB dressing. OT Short Term Goal 3 (Week 1): Pt will demonstrate hemi techniques for completing UB dressing with min verbal cues.   Skilled Therapeutic Interventions/Progress Updates:    1:1. Pt with no c/o pain. Pt stand pivot transfer with RW EOB>w/c<>TTB with CGA and VC for reach back for safety awareness. Pt gathers clothes at w/c level with VC for locking brakes prior to reaching forward. Pt bathes at sit to stand level with supervision with 1 VC to use RUE for NMR. Pt dresses at sit to stand level at sink with A only to spin bra around to back and VC for equal weight bearing through B feet/hemi dressing. Pt with difficulty pulling collapsed heel up on shoes. OT demo shoe funnel and pt return demo donning B shoes with 1 VC for technique. OT sets up breakfast tray with forced use of RUE to open all containers.  Therapy Documentation Precautions:  Precautions Precautions: Fall Restrictions Weight Bearing Restrictions: No  See Function Navigator for Current Functional Status.   Therapy/Group: Individual Therapy  Tonny Branch 10/16/2017, 7:08 AM

## 2017-10-16 NOTE — Progress Notes (Signed)
[facility]ICINE & REHABILITATION     PROGRESS NOTE  Subjective/Complaints:  Still not sleeping well, otherwise no new complaints  ROS: pt denies nausea, vomiting, diarrhea, cough, shortness of breath or chest pain    Objective: Vital Signs: Blood pressure 120/64, pulse 67, temperature 97.9 F (36.6 C), temperature source Oral, resp. rate 17, height 5\' 7"  (1.702 m), weight 92.1 kg (203 lb), SpO2 97 %. No results found. Recent Labs    10/15/17 0802  WBC 11.3*  HGB 13.7  HCT 41.4  PLT 385   Recent Labs    10/15/17 0802  NA 136  K 3.7  CL 100*  GLUCOSE 101*  BUN 29*  CREATININE 1.13*  CALCIUM 9.6   CBG (last 3)  Recent Labs    10/15/17 2114 10/16/17 0630 10/16/17 1115  GLUCAP 222* 95 138*    Wt Readings from Last 3 Encounters:  10/14/17 92.1 kg (203 lb)  10/11/17 105.4 kg (232 lb 5.8 oz)  09/28/17 101.2 kg (223 lb)    Physical Exam:  BP 120/64   Pulse 67   Temp 97.9 F (36.6 C) (Oral)   Resp 17   Ht 5\' 7"  (1.702 m)   Wt 92.1 kg (203 lb)   SpO2 97%   BMI 31.79 kg/m  Constitutional: She appears well-developed. Obese.   HENT: Normocephalic and atraumatic.  Eyes: EOM are normal. No discharge.  Cardiovascular: Regular rate and rhythm without JVD Respiratory: Normal effort breath sounds normal GI: Bowel sounds are normal. She exhibits no distension.  Musculoskeletal: She exhibits no edema or tenderness.  Neurological: She is alert.  Makes good eye contact with examiner.  Followed simple commands.  Motor: RUE: 4/5 proximal to distal RLE: 4/5 HF, 4/5 KE, ADF/PF (stable LUE: 5/5 proximal to distal LLE: 5/5 proximal to distal Skin: Skin is warm and dry.  Psychiatric: She has a normal mood and affect. Her behavior is normal.   Assessment/Plan: 1. Functional deficits secondary to metastatic lung cancer with left frontal lobe hemorrhagic metastatic lesion which require 3+ hours per day of interdisciplinary therapy in a comprehensive inpatient  rehab setting. Physiatrist is providing close team supervision and 24 hour management of active medical problems listed below. Physiatrist and rehab team continue to assess barriers to discharge/monitor patient progress toward functional and medical goals.  Function:  Bathing Bathing position   Position: Shower  Bathing parts Body parts bathed by patient: Right arm, Left arm, Chest, Abdomen, Front perineal area, Right upper leg, Left upper leg, Left lower leg Body parts bathed by helper: Buttocks, Right lower leg, Back  Bathing assist Assist Level: Touching or steadying assistance(Pt > 75%)      Upper Body Dressing/Undressing Upper body dressing   What is the patient wearing?: Pull over shirt/dress     Pull over shirt/dress - Perfomed by patient: Thread/unthread right sleeve, Thread/unthread left sleeve, Put head through opening, Pull shirt over trunk          Upper body assist Assist Level: Supervision or verbal cues      Lower Body Dressing/Undressing Lower body dressing   What is the patient wearing?: Underwear, Pants Underwear - Performed by patient: Thread/unthread right underwear leg, Thread/unthread left underwear leg Underwear - Performed by helper: Pull underwear up/down Pants- Performed by patient: Thread/unthread left pants leg Pants- Performed by helper: Thread/unthread right pants leg, Pull pants up/down  Lower body assist Assist for lower body dressing: (ModA)      Toileting Toileting   Toileting steps completed by patient: Adjust clothing prior to toileting, Performs perineal hygiene, Adjust clothing after toileting Toileting steps completed by helper: Adjust clothing prior to toileting, Adjust clothing after toileting Toileting Assistive Devices: Grab bar or rail  Toileting assist Assist level: Supervision or verbal cues   Transfers Chair/bed transfer   Chair/bed transfer method: Stand pivot Chair/bed transfer assist level:  Touching or steadying assistance (Pt > 75%) Chair/bed transfer assistive device: Armrests, Medical sales representative     Max distance: 30' Assist level: Touching or steadying assistance (Pt > 75%)   Wheelchair     Max wheelchair distance: 100 Assist Level: Touching or steadying assistance (Pt > 75%)  Cognition Comprehension Comprehension assist level: Follows basic conversation/direction with no assist  Expression Expression assist level: Expresses basic needs/ideas: With no assist  Social Interaction Social Interaction assist level: Interacts appropriately 90% of the time - Needs monitoring or encouragement for participation or interaction.  Problem Solving Problem solving assist level: Solves complex 90% of the time/cues < 10% of the time  Memory Memory assist level: More than reasonable amount of time    Medical Problem List and Plan: 1.  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 Decadron added to decreased peri-infarct swelling   Cont CIR   Due to improvement, decreased decadron to 2 mg on 11/24 2.  DVT Prophylaxis/Anticoagulation: SCDs. Monitor for any signs of DVT.    ?Vascular study performed, awaiting results 3. Pain Management: Hydrocodone and Neurontin as needed 4. Mood: Xanax 0.25 mg daily as needed   -We will schedule low-dose trazodone to assist sleep 5. Neuropsych: This patient is capable of making decisions on her own behalf. 6. Skin/Wound Care: Routine skin checks 7. Fluids/Electrolytes/Nutrition: Routine I&O's 8. Seizure prophylaxis. Keppra 500 mg twice a day 9. Diabetes mellitus and peripheral neuropathy. Hemoglobin A1c 8.1.  Lantus insulin 40 units daily, Glucophage 500 mg daily. Check blood sugars before meals and at bedtime   Tradjenta 5 mg daily d/ced on 11/23.    NovoLog 4 units 3 times a day, increased to 7 units on 11/23     Overall blood sugar showing improvement  10.  Hypertension. Cozaar 25 mg daily, Toprol 25 mg daily, Norvasc 5 mg twice a day,Hydralazine 25 mg twice daily.    Controlled on 11/24 11. COPD with tobacco abuse. Continue inhalers. Provide counseling 12. CKD stage III. Creatinine baseline 1.28.    Cr 1.13 on November 23       Cont to monitor 13. Hyponatremia. Resolved   Na+ 136 on 11/20 14. Constipation. Laxative assistance 15. Hyperlipidemia. Crestor 16. Hypoalbuminemia   Supplement initiated on 11/20 17. Leukocytosis   WBCs 11.3 on 11/23   Likely steroid related e 18. Cough   Improving   Chest x-ray reviewed, showing mass, but no acute changes  LOS (Days) 5 A FACE TO FACE EVALUATION WAS PERFORMED  Herbert Aguinaldo T 10/16/2017 11:55 AM

## 2017-10-16 NOTE — Progress Notes (Signed)
Speech Language Pathology Daily Session Note  Patient Details  Name: [name] MRN: [id number] Date of Birth: 1946-11-05  Today's Date: 10/16/2017 SLP Individual Time: 0805-0900 SLP Individual Time Calculation (min): 55 min  Short Term Goals: Week 1: SLP Short Term Goal 1 (Week 1): Pt will recall new, daily information with supervision verbal cues for use of external aids.   SLP Short Term Goal 2 (Week 1): Pt will complete semi-complex tasks with supervision verbal cues for functional problem solving.   SLP Short Term Goal 3 (Week 1): Pt will recognize and correct errors in the moment during functional tasks with supervision verbal cues.    Skilled Therapeutic Interventions: Skilled treatment session focused on cognitive goals. Patient completed a complex money management task with Mod I and reported she felt like she was at her cognitive baseline and no longer needed skilled SLP services. Therefore, patient was administered the MoCA (version 7.2). Patient scored 20/30 points with a score of 26 or above considered normal. Patient demonstrated deficits in recall, attention and executive functioning. Patient's score appeared to improve her awareness of her cognitive impairments as she reported, "I am not as sharp as I thought I was and I need to get back to that." Patient then agreeable to continue with SLP services. Patient left upright in wheelchair with all needs within reach. Continue with current plan of care.      Function:   Cognition Comprehension Comprehension assist level: Follows basic conversation/direction with no assist  Expression   Expression assist level: Expresses basic needs/ideas: With no assist  Social Interaction Social Interaction assist level: Interacts appropriately with others with medication or extra time (anti-anxiety, antidepressant).  Problem Solving Problem solving assist level: Solves basic problems with no assist  Memory Memory assist level: Recognizes or  recalls 75 - 89% of the time/requires cueing 10 - 24% of the time    Pain Pain Assessment Pain Assessment: No/denies pain  Therapy/Group: Individual Therapy  Brissia Delisa 10/16/2017, 12:38 PM

## 2017-10-16 NOTE — Progress Notes (Signed)
Occupational Therapy Session Note  Patient Details  Name: [name] MRN: [id number] Date of Birth: 11/26/1945  Today's Date: 10/16/2017 OT Individual Time: 1002-1102 OT Individual Time Calculation (min): 60 min    Short Term Goals: Week 1:  OT Short Term Goal 1 (Week 1): Pt will complete toilet transfer with MinA.  OT Short Term Goal 2 (Week 1): Pt will complete 2/3 parts of LB dressing. OT Short Term Goal 3 (Week 1): Pt will demonstrate hemi techniques for completing UB dressing with min verbal cues.   Skilled Therapeutic Interventions/Progress Updates:    Pt completed functional mobility to the dayroom with min assist using the RW to start session.  She was able to use the UE ergonometer for 3 sets of 3 mins to work on [employer] and coordination.  All sets completed on level 8 resistance, with the first set performed with BUEs and the second and third sets with isolated use of the RUE only.  RPMs maintained at 20-25 for all sets.  Once completed had pt use her LEs only in the wheelchair to propel herself down to the main therapy gym.  Mod instructional cueing for greater AROM on the right to assist with this.  Once in the gym had her stand to complete PVC pipe puzzle with min guard assist and min demonstrational cueing to maintain right knee extension.  She then worked on [employer] coordination with ball toss and catch in sitting, with 90% accuracy using medium sized slow ball.  Finished session with ambulation back to the room with min assist using the RW.  Pt placed back in bed with call button and phone in reach and bed alarm in place.    Therapy Documentation Precautions:  Precautions Precautions: Fall Restrictions Weight Bearing Restrictions: No   Pain: Pain Assessment Pain Assessment: No/denies pain ADL: See Function Navigator for Current Functional Status.   Therapy/Group: Individual Therapy  Dasani Crear OTR/L 10/16/2017, 12:21 PM

## 2017-10-17 ENCOUNTER — Inpatient Hospital Stay (HOSPITAL_COMMUNITY): Payer: Medicare HMO | Admitting: Occupational Therapy

## 2017-10-17 ENCOUNTER — Inpatient Hospital Stay (HOSPITAL_COMMUNITY): Payer: Medicare HMO

## 2017-10-17 LAB — GLUCOSE, CAPILLARY
GLUCOSE-CAPILLARY: 151 mg/dL — AB (ref 65–99)
GLUCOSE-CAPILLARY: 98 mg/dL (ref 65–99)
GLUCOSE-CAPILLARY: 143 mg/dL — AB (ref 65–99)
Glucose-Capillary: 150 mg/dL — ABNORMAL HIGH (ref 65–99)

## 2017-10-17 MED ORDER — BACLOFEN 5 MG HALF TABLET
5.0000 mg | ORAL_TABLET | Freq: Two times a day (BID) | ORAL | Status: DC | PRN
  Administered 2017-10-17 – 2017-10-23 (×2): 5 mg via ORAL
  Filled 2017-10-17 (×2): qty 1

## 2017-10-17 NOTE — Progress Notes (Signed)
[facility] PHYSICAL MEDICINE & REHABILITATION     PROGRESS NOTE  Subjective/Complaints:  Slept much better last night.  Does report that she is having some mild cramping   ROS: pt denies nausea, vomiting, diarrhea, cough, shortness of breath or chest pain    Objective: Vital Signs: Blood pressure (!) 120/57, pulse 64, temperature 98 F (36.7 C), temperature source Oral, resp. rate 16, height 5\' 7"  (1.702 m), weight 91 kg (200 lb 9.9 oz), SpO2 95 %. No results found. Recent Labs    10/15/17 0802  WBC 11.3*  HGB 13.7  HCT 41.4  PLT 385   Recent Labs    10/15/17 0802  NA 136  K 3.7  CL 100*  GLUCOSE 101*  BUN 29*  CREATININE 1.13*  CALCIUM 9.6   CBG (last 3)  Recent Labs    10/16/17 1624 10/16/17 2045 10/17/17 0628  GLUCAP 254* 111* 151*    Wt Readings from Last 3 Encounters:  10/17/17 91 kg (200 lb 9.9 oz)  10/11/17 105.4 kg (232 lb 5.8 oz)  09/28/17 101.2 kg (223 lb)    Physical Exam:  BP (!) 120/57   Pulse 64   Temp 98 F (36.7 C) (Oral)   Resp 16   Ht 5\' 7"  (1.702 m)   Wt 91 kg (200 lb 9.9 oz)   SpO2 95%   BMI 31.42 kg/m  Constitutional: She appears well-developed. Obese.   HENT: Normocephalic and atraumatic.  Eyes: EOM are normal. No discharge.  Cardiovascular: Regular rate and rhythm without JVD Respiratory: Normal breath sounds GI: Bowel sounds are normal. She exhibits no distension.  Musculoskeletal: She exhibits no edema or tenderness.  Neurological: She is alert.  Makes good eye contact with examiner.  Followed simple commands.  Motor: RUE: 4/5 proximal to distal RLE: 4/5 HF, 4/5 KE, ADF/PF--stable LUE: 5/5 proximal to distal LLE: 5/5 proximal to distal Skin: Skin is warm and dry.  Psychiatric: She has a normal mood and affect. Her behavior is normal.   Assessment/Plan: 1. Functional deficits secondary to metastatic lung cancer with left frontal lobe hemorrhagic metastatic lesion which require 3+ hours per day of interdisciplinary  therapy in a comprehensive inpatient rehab setting. Physiatrist is providing close team supervision and 24 hour management of active medical problems listed below. Physiatrist and rehab team continue to assess barriers to discharge/monitor patient progress toward functional and medical goals.  Function:  Bathing Bathing position   Position: Shower  Bathing parts Body parts bathed by patient: Right arm, Left arm, Chest, Abdomen, Front perineal area, Right upper leg, Left upper leg, Left lower leg Body parts bathed by helper: Buttocks, Right lower leg, Back  Bathing assist Assist Level: Touching or steadying assistance(Pt > 75%)      Upper Body Dressing/Undressing Upper body dressing   What is the patient wearing?: Pull over shirt/dress     Pull over shirt/dress - Perfomed by patient: Thread/unthread right sleeve, Thread/unthread left sleeve, Put head through opening, Pull shirt over trunk          Upper body assist Assist Level: Supervision or verbal cues      Lower Body Dressing/Undressing Lower body dressing   What is the patient wearing?: Underwear, Pants Underwear - Performed by patient: Thread/unthread right underwear leg, Thread/unthread left underwear leg Underwear - Performed by helper: Pull underwear up/down Pants- Performed by patient: Thread/unthread left pants leg Pants- Performed by helper: Thread/unthread right pants leg, Pull pants up/down  Lower body assist Assist for lower body dressing: (ModA)      Toileting Toileting   Toileting steps completed by patient: Adjust clothing prior to toileting, Performs perineal hygiene, Adjust clothing after toileting Toileting steps completed by helper: Adjust clothing prior to toileting, Adjust clothing after toileting [place]: Grab bar or rail  Toileting assist Assist level: Touching or steadying assistance (Pt.75%)   Transfers Chair/bed transfer   Chair/bed transfer  method: Stand pivot Chair/bed transfer assist level: Touching or steadying assistance (Pt > 75%) Chair/bed transfer assistive device: Armrests, Medical sales representative     Max distance: 150' Assist level: Touching or steadying assistance (Pt > 75%)   Wheelchair     Max wheelchair distance: 100 Assist Level: Touching or steadying assistance (Pt > 75%)  Cognition Comprehension Comprehension assist level: Follows basic conversation/direction with no assist  Expression Expression assist level: Expresses basic needs/ideas: With no assist  Social Interaction Social Interaction assist level: Interacts appropriately with others with medication or extra time (anti-anxiety, antidepressant).  Problem Solving Problem solving assist level: Solves basic problems with no assist  Memory Memory assist level: Recognizes or recalls 75 - 89% of the time/requires cueing 10 - 24% of the time    Medical Problem List and Plan: 1.  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 Decadron added to decreased peri-infarct swelling   Cont CIR   Due to improvement, decreased decadron to 2 mg on 11/24 2.  DVT Prophylaxis/Anticoagulation: SCDs. Monitor for any signs of DVT.    Dopplers negative 3. Pain Management: Hydrocodone and Neurontin as needed   -baclofen prn for cramps/spasms 4. Mood: Xanax 0.25 mg daily as needed   -Low-dose trazodone effective   5. Neuropsych: This patient is capable of making decisions on her own behalf. 6. Skin/Wound Care: Routine skin checks 7. Fluids/Electrolytes/Nutrition: Routine I&O's 8. Seizure prophylaxis. Keppra 500 mg twice a day 9. Diabetes mellitus and peripheral neuropathy. Hemoglobin A1c 8.1.  Lantus insulin 40 units daily, Glucophage 500 mg daily. Check blood sugars before meals and at bedtime   Tradjenta 5 mg daily d/ced on 11/23.    NovoLog 4 units 3 times a day, increased to 7 units on 11/23      Overall blood sugars are improved  10. Hypertension. Cozaar 25 mg daily, Toprol 25 mg daily, Norvasc 5 mg twice a day,Hydralazine 25 mg twice daily.    Controlled on 11/25 11. COPD with tobacco abuse. Continue inhalers. Provide counseling 12. CKD stage III. Creatinine baseline 1.28.    Cr 1.13 on November 23       Cont to monitor 13. Hyponatremia. Resolved   Na+ 136 on 11/20 14. Constipation. Laxative assistance 15. Hyperlipidemia. Crestor 16. Hypoalbuminemia   Supplement initiated on 11/20 17. Leukocytosis   WBCs 11.3 on 11/23   Likely steroid related   18. Cough   Improved   Chest x-ray reviewed, showing mass, but no acute changes  LOS (Days) 6 A FACE TO FACE EVALUATION WAS PERFORMED  Lessa Huge T 10/17/2017 10:13 AM

## 2017-10-17 NOTE — Progress Notes (Signed)
Physical Therapy Note  Patient Details  Name: [name] MRN: [id number] Date of Birth: 1946/10/06 Today's Date: 10/17/2017  1000-1100, 60 min individual tx Pain: 5/10, heartburn; Deidre Ala, RN aware and pt already given meds. PTencouraged pt to drink sips of water during session, which helped decrease the heart burn.  Pt stated she did not feel well, but willing to start with bedside exs.  15 x 1 alternating ankle pumps, bil lower trunk rotation, bil bridging with towel roll squeezes between knees to activate core, R/L short arc quad knee ext with focus on eccentric control, 15 x 2 modified abdominal crunches. Bed mobility to set EOB to R with supervision in flat bed, no rail.  Pt donned shoes with supervision. Simulated car transfer to sedan ht seat, using RW with min guard assist.    Pt appeared depressed while mentioning her current dx of brain mets.  PT offered emotional support, focusing in her progress so far.  PT explained role of Neuropsych here; pt would like to speak to Dr. Sima Matas.  PT left message fo Lorre Nick, CSW requesting consult with him.   PT passed pt to [place], [place] for next session.   See function navigator for current status.  [name] 10/17/2017, 8:20 AM

## 2017-10-17 NOTE — Progress Notes (Signed)
Occupational Therapy Session Note  Patient Details  Name: [name] MRN: [id number] Date of Birth: 07/18/1946  Today's Date: 10/17/2017 OT Individual Time: 0800-0900 OT Individual Time Calculation (min): 60 min    Short Term Goals: Week 1:  OT Short Term Goal 1 (Week 1): Pt will complete toilet transfer with MinA.  OT Short Term Goal 2 (Week 1): Pt will complete 2/3 parts of LB dressing. OT Short Term Goal 3 (Week 1): Pt will demonstrate hemi techniques for completing UB dressing with min verbal cues.   Skilled Therapeutic Interventions/Progress Updates:    Pt completed dressing sit to stand from EOB with supervision and increased time.  Once completed she was able to ambulate down to the therapy gym with min assist using the RW for support.  Pt worked in quadriped on weightbearing over the RUE while using the LUE to wash the therapy mat.  Next progressed to EOM where pt worked on standing and reaching down toward the floor to pick up pegs with the RUE and then place them in a peg board in front on the table.  Min guard assist to complete task.  She then progressed to working with use of the [employer] with emphasis on shoulder flexion on the RUE to move the ball back and forth.  Finished session with pt in sitting working on catching and tossing small ball.  Had pt catch it with both hands and then toss back with just the right.  Increased difficulty noted with tossing it back.  Returned to room via walker with min assist.  Pt left in wheelchair with call button and phone in reach.    Therapy Documentation Precautions:  Precautions Precautions: Fall Restrictions Weight Bearing Restrictions: No   Vital Signs: Therapy Vitals Pulse Rate: 64 BP: (!) 120/57 Oxygen Therapy O2 Device: Not Delivered Pain: Pain Assessment Pain Assessment: Faces Faces Pain Scale: Hurts a little bit Pain Type: Acute pain Pain Location: Neck Pain Descriptors / Indicators: Discomfort;Aching Pain Onset:  On-going Pain Intervention(s): Repositioned ADL: See Function Navigator for Current Functional Status.   Therapy/Group: Individual Therapy  Kiernan Atkerson OTR/L 10/17/2017, 10:55 AM

## 2017-10-17 NOTE — Progress Notes (Signed)
Scheduled trazodone given at 2126, not effective. PRN xanax given per patient's request at 2356. Refused antivert. Up to void 3 times to void. Urine malodorous. Ate graham crackers for HS snack. Patrici Ranks A

## 2017-10-17 NOTE — Progress Notes (Signed)
Occupational Therapy Session Note  Patient Details  Name: [name] MRN: [id number] Date of Birth: July 19, 1946  Today's Date: 10/17/2017 OT Individual Time: 1105-1201 OT Individual Time Calculation (min): 56 min    Short Term Goals: Week 1:  OT Short Term Goal 1 (Week 1): Pt will complete toilet transfer with MinA.  OT Short Term Goal 2 (Week 1): Pt will complete 2/3 parts of LB dressing. OT Short Term Goal 3 (Week 1): Pt will demonstrate hemi techniques for completing UB dressing with min verbal cues.   Skilled Therapeutic Interventions/Progress Updates:    Tx focus on ADL retraining, functional ambulation with device, and Rt NMR during ADL and IADL tasks.   Pt greeted via PT handoff in hallway, requesting to shower. She was escorted back to room and ambulated with RW and Min A to TTB. Pt doffing clothing with extra time while seated. She bathed at sit<stand level with close supervision while completing pericare with R UE(!), utilizing figure 4 position to wash both feet. She required increased time due to fatigue and c/o muscle cramps in Rt flank. She then ambulated to w/c placed at bedside. Assist for hooking bra. Pt pulling down overhead dress in standing with RW and Min A. After completing oral care/grooming tasks w/c level with bilateral UEs, she self propelled partway to family room. HOH for improving propulsion technique with R UE. Pt provided encouragement when she became frustrated. Once in family room, pt scanning to Rt to locate necessary items for coffee prep. Used R UE at dominant level to open small packages. She then self propelled partway back to room in manner as written above. Pt escorted remainder of way and was set up for lunch. Pt left with all needs at session exit.   Music used therapeutically to improve affect during shower.     Therapy Documentation Precautions:  Precautions Precautions: Fall Restrictions Weight Bearing Restrictions: No Pain: No c/o pain during  tx    ADL: ADL ADL Comments: please see functional navigator      See Function Navigator for Current Functional Status.   Therapy/Group: Individual Therapy  Jayjay Littles A Deon Ivey 10/17/2017, 12:40 PM

## 2017-10-18 ENCOUNTER — Inpatient Hospital Stay (HOSPITAL_COMMUNITY): Payer: Medicare HMO | Admitting: Physical Therapy

## 2017-10-18 ENCOUNTER — Inpatient Hospital Stay (HOSPITAL_COMMUNITY): Payer: Medicare HMO

## 2017-10-18 ENCOUNTER — Inpatient Hospital Stay (HOSPITAL_COMMUNITY): Payer: Medicare HMO | Admitting: Occupational Therapy

## 2017-10-18 DIAGNOSIS — M62838 Other muscle spasm: Secondary | ICD-10-CM

## 2017-10-18 LAB — GLUCOSE, CAPILLARY
GLUCOSE-CAPILLARY: 96 mg/dL (ref 65–99)
Glucose-Capillary: 149 mg/dL — ABNORMAL HIGH (ref 65–99)
Glucose-Capillary: 147 mg/dL — ABNORMAL HIGH (ref 65–99)
Glucose-Capillary: 138 mg/dL — ABNORMAL HIGH (ref 65–99)

## 2017-10-18 NOTE — Progress Notes (Signed)
PHYSICAL MEDICINE & REHABILITATION     PROGRESS NOTE  Subjective/Complaints:  Pt seen laying in bed this AM.  She states she slept well overnight.  She feels she is doing much better.   ROS: Denies nausea, vomiting, diarrhea, shortness of breath or chest pain    Objective: Vital Signs: Blood pressure 123/64, pulse 71, temperature 99 F (37.2 C), temperature source Oral, resp. rate 16, height 5\' 7"  (1.702 m), weight 91.1 kg (200 lb 14.3 oz), SpO2 98 %. No results found. No results for input(s): WBC, HGB, HCT, PLT in the last 72 hours. No results for input(s): NA, K, CL, GLUCOSE, BUN, CREATININE, CALCIUM in the last 72 hours.  Invalid input(s): CO CBG (last 3)  Recent Labs    10/17/17 1647 10/17/17 2054 10/18/17 0619  GLUCAP 150* 143* 149*    Wt Readings from Last 3 Encounters:  10/18/17 91.1 kg (200 lb 14.3 oz)  10/11/17 105.4 kg (232 lb 5.8 oz)  09/28/17 101.2 kg (223 lb)    Physical Exam:  BP 123/64 (BP Location: Left Arm)   Pulse 71   Temp 99 F (37.2 C) (Oral)   Resp 16   Ht 5\' 7"  (1.702 m)   Wt 91.1 kg (200 lb 14.3 oz)   SpO2 98%   BMI 31.46 kg/m  Constitutional: She appears well-developed. Obese.   HENT: Normocephalic and atraumatic.  Eyes: EOM are normal. No discharge.  Cardiovascular: Regular rate and rhythm. No JVD Respiratory: Normal breath sounds. Unlabored. GI: Bowel sounds are normal. She exhibits no distension.  Musculoskeletal: She exhibits no edema or tenderness.  Neurological: She is alert.  Makes good eye contact with examiner.  Followed simple commands.  Motor: RUE: 4+/5 proximal to distal RLE: 4+/5 HF, KE, ADF/PF LUE: 5/5 proximal to distal LLE: 5/5 proximal to distal Skin: Skin is warm and dry.  Psychiatric: She has a normal mood and affect. Her behavior is normal.   Assessment/Plan: 1. Functional deficits secondary to metastatic lung cancer with left frontal lobe hemorrhagic metastatic lesion which require 3+ hours per day  of interdisciplinary therapy in a comprehensive inpatient rehab setting. Physiatrist is providing close team supervision and 24 hour management of active medical problems listed below. Physiatrist and rehab team continue to assess barriers to discharge/monitor patient progress toward functional and medical goals.  Function:  Bathing Bathing position   Position: Shower  Bathing parts Body parts bathed by patient: Right arm, Left arm, Chest, Abdomen, Front perineal area, Right upper leg, Left upper leg, Left lower leg, Right lower leg, Buttocks Body parts bathed by helper: Back  Bathing assist Assist Level: Touching or steadying assistance(Pt > 75%)      Upper Body Dressing/Undressing Upper body dressing   What is the patient wearing?: Pull over shirt/dress     Pull over shirt/dress - Perfomed by patient: Thread/unthread right sleeve, Thread/unthread left sleeve, Put head through opening, Pull shirt over trunk          Upper body assist Assist Level: Supervision or verbal cues      Lower Body Dressing/Undressing Lower body dressing   What is the patient wearing?: Non-skid slipper socks, Underwear Underwear - Performed by patient: Thread/unthread right underwear leg, Thread/unthread left underwear leg, Pull underwear up/down Underwear - Performed by helper: Pull underwear up/down Pants- Performed by patient: Thread/unthread left pants leg, Thread/unthread right pants leg Pants- Performed by helper: Thread/unthread right pants leg, Pull pants up/down Non-skid slipper socks- Performed by patient: Don/doff right sock, Don/doff  left sock                    Lower body assist Assist for lower body dressing: Touching or steadying assistance (Pt > 75%)      Toileting Toileting   Toileting steps completed by patient: Adjust clothing prior to toileting, Performs perineal hygiene, Adjust clothing after toileting Toileting steps completed by helper: Adjust clothing prior to  toileting, Adjust clothing after toileting Toileting Assistive Devices: Grab bar or rail  Toileting assist Assist level: Touching or steadying assistance (Pt.75%)   Transfers Chair/bed transfer   Chair/bed transfer method: Stand pivot Chair/bed transfer assist level: Touching or steadying assistance (Pt > 75%) Chair/bed transfer assistive device: Armrests     Locomotion Ambulation     Max distance: 150' Assist level: Touching or steadying assistance (Pt > 75%)   Wheelchair     Max wheelchair distance: 100 Assist Level: Touching or steadying assistance (Pt > 75%)  Cognition Comprehension Comprehension assist level: Follows basic conversation/direction with no assist  Expression Expression assist level: Expresses basic needs/ideas: With no assist  Social Interaction Social Interaction assist level: Interacts appropriately with others with medication or extra time (anti-anxiety, antidepressant).  Problem Solving Problem solving assist level: Solves basic problems with no assist  Memory Memory assist level: Recognizes or recalls 75 - 89% of the time/requires cueing 10 - 24% of the time    Medical Problem List and Plan: 1.  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 Decadron added to decreased peri-infarct swelling   Cont CIR   Due to improvement, decreased decadron to 2 mg on 11/24 2.  DVT Prophylaxis/Anticoagulation: SCDs. Monitor for any signs of DVT.    Dopplers negative 3. Pain Management: Hydrocodone and Neurontin as needed   -baclofen prn for cramps/spasms 4. Mood: Xanax 0.25 mg daily as needed 5. Neuropsych: This patient is capable of making decisions on her own behalf. 6. Skin/Wound Care: Routine skin checks 7. Fluids/Electrolytes/Nutrition: Routine I&O's 8. Seizure prophylaxis. Keppra 500 mg twice a day 9. Diabetes mellitus and peripheral neuropathy. Hemoglobin A1c 8.1.  Lantus insulin 40 units daily,  Glucophage 500 mg daily. Check blood sugars before meals and at bedtime   Tradjenta 5 mg daily d/ced on 11/23.    NovoLog 4 units 3 times a day, increased to 7 units on 11/23     Relatively controlled on 11/26 10. Hypertension. Cozaar 25 mg daily, Toprol 25 mg daily, Norvasc 5 mg twice a day,Hydralazine 25 mg twice daily.    Controlled on 11/26 11. COPD with tobacco abuse. Continue inhalers. Provide counseling 12. CKD stage III. Creatinine baseline 1.28.    Cr 1.13 on November 23   Cont to monitor 13. Hyponatremia. Resolved   Na+ 136 on 11/23 14. Constipation. Laxative assistance 15. Hyperlipidemia. Crestor 16. Hypoalbuminemia   Supplement initiated on 11/20 17. Leukocytosis   WBCs 11.3 on 11/23   Likely steroid related   18. Cough   Improved   Chest x-ray reviewed, showing mass, but no acute changes  LOS (Days) 7 A FACE TO FACE EVALUATION WAS PERFORMED  Ankit Lorie Phenix 10/18/2017 8:37 AM

## 2017-10-18 NOTE — Progress Notes (Addendum)
Called to room by dtr. Reported mother's tooth fell out.Reported tearing apart [facility] package with teeth and tooth came out. Assessment of mouth noted rt front lateral incisor tooth broke off at gum line. Unable to replace tooth in gum, no bleeding noted, reported felt tooth move in mouth and suddenly it was broke off. Algis Liming PA notified of event. Discussed with pt and family re options, unable to stick tooth back into gum as there is no root or other portion to hold tooth in gum. Pt denied pain discomfort at present. Tooth placed in plastic baggie and returned to patient. Pt reported she will follow up with MD who placed vaneers on her teeth to see what they can do about the tooth. Margarito Liner

## 2017-10-18 NOTE — Progress Notes (Signed)
Physical Therapy Session Note  Patient Details  Name: [name] MRN: [id number] Date of Birth: 04/10/1946  Today's Date: 10/18/2017 PT Individual Time: 1300-1400 PT Individual Time Calculation (min): 60 min    Skilled Therapeutic Interventions/Progress Updates:   pt performed gait with RW in home and controlled environments with close supervision 100' x 2.  Gait with obstacle negotiation with RW with close supervision, without AD with min HHA.  Pt able to recognize fatigue by Rt foot drag and is able to rest and react approrpiately.  Berg balance test completed, see below for details.  Pt educated on fall risk and recommendation for use of AD, pt verbalizes understanding.  Standing balance with step ups laterally and forward, sidestepping, sit to stand with adductor squeeze and continued gait without AD.  Pt is pleased with progress.  Therapy Documentation Pain: Pain Assessment Pain Assessment: No/denies pain  Balance: Standardized Balance Assessment Standardized Balance Assessment: Berg Balance Test Berg Balance Test Sit to Stand: Able to stand without using hands and stabilize independently Standing Unsupported: Able to stand 2 minutes with supervision Sitting with Back Unsupported but Feet Supported on Floor or Stool: Able to sit safely and securely 2 minutes Stand to Sit: Sits safely with minimal use of hands Transfers: Able to transfer safely, definite need of hands Standing Unsupported with Eyes Closed: Able to stand 10 seconds with supervision Standing Ubsupported with Feet Together: Able to place feet together independently and stand for 1 minute with supervision From Standing, Reach Forward with Outstretched Arm: Reaches forward but needs supervision From Standing Position, Pick up Object from Floor: Able to pick up shoe, needs supervision From Standing Position, Turn to Look Behind Over each Shoulder: Turn sideways only but maintains balance Turn 360 Degrees: Able to turn  360 degrees safely but slowly Standing Unsupported, Alternately Place Feet on Step/Stool: Able to complete >2 steps/needs minimal assist Standing Unsupported, One Foot in Front: Able to take small step independently and hold 30 seconds Standing on One Leg: Able to lift leg independently and hold 5-10 seconds Total Score: 38   See Function Navigator for Current Functional Status.   Therapy/Group: Individual Therapy  [name] 10/18/2017, 1:30 PM

## 2017-10-18 NOTE — Progress Notes (Signed)
Occupational Therapy Session Note  Patient Details  Name: [name] MRN: [id number] Date of Birth: 11/19/1946  Today's Date: 10/18/2017 OT Individual Time: 0904-1001 OT Individual Time Calculation (min): 57 min   Short Term Goals: Week 1:  OT Short Term Goal 1 (Week 1): Pt will complete toilet transfer with MinA.  OT Short Term Goal 2 (Week 1): Pt will complete 2/3 parts of LB dressing. OT Short Term Goal 3 (Week 1): Pt will demonstrate hemi techniques for completing UB dressing with min verbal cues.     Skilled Therapeutic Interventions/Progress Updates:    Tx focus on ADL retraining, Rt NMR, activity tolerance, balance, and functional transfers.   Pt greeted in w/c, requesting shower. Clothes already laid out on bed. She ambulated with RW to bathroom and transferred to TTB with Min A. Pt bathing at sit<stand level, using R UE at dominant level. Supervision dynamic balance without UE support during pericare completion. Seated figure 4 for washing feet. Also able to dry her back with bilateral UE involvement. Pt then ambulated to w/c placed at bedside. She required cues to don LB garments while seated and for safe hand placement during sit<stand transitions with RW. Oral care/grooming tasks completed standing at sink with close supervision. Discussed bathroom setup for home with pt verbalizing that she has a tub shower. "It's one of my biggest concerns." Pt worried about side stepping into tub. Escorted her to large tub room and practiced tub bench transfers at ambulatory level with RW (Min A). She reported having grab bars positioned in the same manner at home. Discussed additional bathroom modifications she can implement to enhance safety. Pt receptive to education and reported feeling more at ease with bathing at home post d/c. Afterwards she self propelled to therapy apartment for R UE proximal strengthening. She was escorted remainder of way back to room due to time constraints. Pt left in  w/c with all needs within reach, anticipating next therapist.   Therapy Documentation Precautions:  Precautions Precautions: Fall Restrictions Weight Bearing Restrictions: No Pain: No c/o spasms or pain during session    ADL: ADL ADL Comments: please see functional navigator      See Function Navigator for Current Functional Status.   Therapy/Group: Individual Therapy  [name] 10/18/2017, 10:15 AM

## 2017-10-18 NOTE — Progress Notes (Signed)
Speech Language Pathology Daily Session Note  Patient Details  Name: [name] MRN: [id number] Date of Birth: 04-May-1946  Today's Date: 10/18/2017 SLP Individual Time: 1000-1055 SLP Individual Time Calculation (min): 55 min  Short Term Goals: Week 1: SLP Short Term Goal 1 (Week 1): Pt will recall new, daily information with supervision verbal cues for use of external aids.   SLP Short Term Goal 2 (Week 1): Pt will complete semi-complex tasks with supervision verbal cues for functional problem solving.   SLP Short Term Goal 3 (Week 1): Pt will recognize and correct errors in the moment during functional tasks with supervision verbal cues.    Skilled Therapeutic Interventions:Skilled ST services focused on cognitive skills. SLP facilitated semi-complex problem solving, error awareness and working memory scheduling task, pt required mod A verbal/visual cues for problem solving, mod A verbal/visual cues for working memory and min A verbal cues for error awareness/correction. Pt demonstrated difficulty organizing novel information, sustained attention and utilizing working memory. Pt stated task was "hader than I thought it would be" and agreed to continue skilled ST services focusing on attention, recall and problem solving in order maximize functional independence. Pt was left in room with visitor present and call bell within reach. Recommend to continue skilled ST services.     Function:  Eating Eating   Modified Consistency Diet: No Eating Assist Level: No help, No cues           Cognition Comprehension Comprehension assist level: Follows basic conversation/direction with no assist  Expression   Expression assist level: Expresses basic needs/ideas: With no assist  Social Interaction Social Interaction assist level: Interacts appropriately with others with medication or extra time (anti-anxiety, antidepressant).  Problem Solving Problem solving assist level: Solves basic problems  with no assist  Memory Memory assist level: Recognizes or recalls 75 - 89% of the time/requires cueing 10 - 24% of the time    Pain Pain Assessment Pain Assessment: No/denies pain  Therapy/Group: Individual Therapy  [name]  [facility] 10/18/2017, 10:56 AM

## 2017-10-18 NOTE — Progress Notes (Signed)
Has Slept good thus far tonight, after Scheduled trazodone given. Reports PRN baclofen helping with cramps. LBM 11/24. [name] A

## 2017-10-18 NOTE — Significant Event (Signed)
Pt reported she did not take insulin at home PTA and does not want to take meal coverage at discharge., no insulin at discharge is preferred. Info shared with Alcide Goodness PAC, also reporting antivert causes dizziness and refusing thanks, Neoma Laming

## 2017-10-19 ENCOUNTER — Inpatient Hospital Stay (HOSPITAL_COMMUNITY): Payer: Medicare HMO | Admitting: Occupational Therapy

## 2017-10-19 ENCOUNTER — Encounter (HOSPITAL_COMMUNITY): Payer: Medicare HMO | Admitting: Psychology

## 2017-10-19 ENCOUNTER — Inpatient Hospital Stay (HOSPITAL_COMMUNITY): Payer: Medicare HMO | Admitting: Physical Therapy

## 2017-10-19 ENCOUNTER — Ambulatory Visit: Payer: Medicare HMO | Admitting: Oncology

## 2017-10-19 ENCOUNTER — Ambulatory Visit: Payer: Medicare HMO | Admitting: Emergency Medicine

## 2017-10-19 ENCOUNTER — Inpatient Hospital Stay (HOSPITAL_COMMUNITY): Payer: Medicare HMO

## 2017-10-19 ENCOUNTER — Other Ambulatory Visit: Payer: Medicare HMO

## 2017-10-19 DIAGNOSIS — G936 Cerebral edema: Secondary | ICD-10-CM

## 2017-10-19 LAB — GLUCOSE, CAPILLARY
GLUCOSE-CAPILLARY: 157 mg/dL — AB (ref 65–99)
Glucose-Capillary: 115 mg/dL — ABNORMAL HIGH (ref 65–99)
Glucose-Capillary: 158 mg/dL — ABNORMAL HIGH (ref 65–99)
Glucose-Capillary: 84 mg/dL (ref 65–99)

## 2017-10-19 MED ORDER — DEXAMETHASONE 2 MG PO TABS
1.0000 mg | ORAL_TABLET | Freq: Every day | ORAL | Status: DC
  Administered 2017-10-19 – 2017-10-20 (×2): 1 mg via ORAL
  Filled 2017-10-19 (×2): qty 1
  Filled 2017-10-19: qty 0.5

## 2017-10-19 NOTE — Progress Notes (Signed)
Occupational Therapy Weekly Progress Note  Patient Details  Name: [name] MRN: [id number] Date of Birth: 08-Jul-1946  Beginning of progress report period: October 12, 2017 End of progress report period: October 19, 2017  Today's Date: 10/19/2017 OT Individual Time: 7092-9574 OT Individual Time Calculation (min): 31 min  and Today's Date: 10/19/2017 OT Missed Time: 29 Minutes Missed Time Reason: Patient fatigue   Patient has met 3 of 3 short term goals.  Pt is making steady progress towards OT goals. She cont to be most limited by R inattention and decreased functional activity tolerance. Goals remain appropriate and aiming for planned d/c home next week.   Patient continues to demonstrate the following deficits:hemiplegia affecting dominant side and muscle weakness (generalized)  and therefore will continue to benefit from skilled OT intervention to enhance overall performance with BADL and Reduce care partner burden.  Patient progressing toward long term goals..  Continue plan of care.  OT Short Term Goals Week 1:  OT Short Term Goal 1 (Week 1): Pt will complete toilet transfer with MinA.  OT Short Term Goal 1 - Progress (Week 1): Met OT Short Term Goal 2 (Week 1): Pt will complete 2/3 parts of LB dressing. OT Short Term Goal 2 - Progress (Week 1): Met OT Short Term Goal 3 (Week 1): Pt will demonstrate hemi techniques for completing UB dressing with min verbal cues.  OT Short Term Goal 3 - Progress (Week 1): Met Week 2:  OT Short Term Goal 1 (Week 2): STG=LTG due to LOS  Skilled Therapeutic Interventions/Progress Updates:    Pt seen for OT ADL bathing/dressing session. Pt sitting up in w/c upon arrival, voicing increased fatigue, however, with strong desire to shower. She ambulated throughout room with CGA using RW, min cuing for awareness of environmental obstacles and RW management in functional context. She bathed seated on tub bench, standing with use of grab bars to complete  buttock/ pericare hygiene. She dressed on toilet, requiring rest breaks throughout. With encouragement, pt willing to sit up in recliner for lunch before returning to bed. She declined any further therapy following shower and wanted to rest. Pt left in recliner with all needs in reach.   Therapy Documentation Precautions:  Precautions Precautions: Fall Restrictions Weight Bearing Restrictions: No Pain:   No/ denies pain ADL: ADL ADL Comments: please see functional navigator   See Function Navigator for Current Functional Status.   Therapy/Group: Individual Therapy  Lewis, Aimi Essner C 10/19/2017, 6:47 AM

## 2017-10-19 NOTE — Progress Notes (Signed)
Speech Language Pathology Weekly Progress and Session Note  Patient Details  Name: [name] MRN: [id number] Date of Birth: 1946-09-25  Beginning of progress report period: October 12, 2017 End of progress report period: October 19, 2017  Today's Date: 10/19/2017 SLP Individual Time: 0900-1000 SLP Individual Time Calculation (min): 60 min  Short Term Goals: Week 1: SLP Short Term Goal 1 (Week 1): Pt will recall new, daily information with supervision verbal cues for use of external aids.   SLP Short Term Goal 1 - Progress (Week 1): Met SLP Short Term Goal 2 (Week 1): Pt will complete semi-complex tasks with supervision verbal cues for functional problem solving.   SLP Short Term Goal 2 - Progress (Week 1): Not met SLP Short Term Goal 3 (Week 1): Pt will recognize and correct errors in the moment during functional tasks with supervision verbal cues.   SLP Short Term Goal 3 - Progress (Week 1): Met    New Short Term Goals: Week 2: SLP Short Term Goal 1 (Week 2): STG=LTG due to ELOS  Weekly Progress Updates: Pt demonstrated ability to met 3 out 4 goals at supervision level, however semi-complex problem solving is currently min-supervision level. Pt demonstrates greater insight into cognitive impairments and ability to monitor/correct errors. SLP will focus on semi-complex problem solving, recall ability and monitor/correcting errors at Mod I level. Pt continues to benefit from skilled ST services in order to maximize functional independence and reduce burden of care.     Intensity: Minumum of 1-2 x/day, 30 to 90 minutes Frequency: 3 to 5 out of 7 days Duration/Length of Stay: 12/6 Treatment/Interventions: Cognitive remediation/compensation;Cueing hierarchy;Functional tasks;Patient/family education;Environmental controls;Internal/external aids   Daily Session  Skilled Therapeutic Interventions: Skilled ST services focused on cognitive linguistic skills. SLP facilitated  semi-complex problem solving utilizing calendar organization task, pt required min-supervision question cues for problem solving task and supervision cues for monitoring/correctng errors during task.SLP reviewed progress with pt and pt requested to address medication management in upcoming sessions. Pt was left in room with call bell within reach. Recommend to continue skilled ST services.    Function:   Eating Eating                 Cognition Comprehension Comprehension assist level: Follows basic conversation/direction with no assist  Expression   Expression assist level: Expresses basic needs/ideas: With no assist  Social Interaction Social Interaction assist level: Interacts appropriately with others with medication or extra time (anti-anxiety, antidepressant).  Problem Solving Problem solving assist level: Solves complex 90% of the time/cues < 10% of the time;Solves basic problems with no assist  Memory Memory assist level: Recognizes or recalls 90% of the time/requires cueing < 10% of the time   General    Pain Pain Assessment Pain Assessment: No/denies pain  Therapy/Group: Individual Therapy  Rihanna Marseille  [facility] 10/19/2017, 4:18 PM

## 2017-10-19 NOTE — Progress Notes (Signed)
Physical Therapy Session Note  Patient Details  Name: [name] MRN: [id number] Date of Birth: 14-Nov-1946  Today's Date: 10/19/2017 PT Individual Time: 800-900 AND 1030-1100 PT Individual Time Calculation (min): 60 min AND 30 min   Short Term Goals: Week 1:  PT Short Term Goal 1 (Week 1): Patient to consistently demonstrate sit to stand with S PT Short Term Goal 2 (Week 1): Patient to ambulate 80' with min A PT Short Term Goal 3 (Week 1): Patient to negotiate 4 stairs mod A with rails  Skilled Therapeutic Interventions/Progress Updates:   Session 1:  Pt in w/c upon arrival and agreeable to therapy, c/o tooth pain after eating breakfast, however tolerable and ongoing. Worked on balance w/ gait first half of session. Ambulated 150' w/ RW supervision assist and 150' w/o AD Min guard assist. More prominent R foot drag and decreased R foot clearance w/o AD, however pt aware and focusing on increasing foot clearance. Performed tandem walking 25' w/ 2 LOB requiring Mod A to correct. Simulated home environment and ambulated w/ focus on obstacle negotiation on carpet in addition to picking up weighted objects from various heights in all visual fields. Occasional verbal cues to locate objects. Min guard during gait w/ obstacles and object manipulation, no LOB. Performed limits of stability training on Biodex w/ bilateral UE support and manual facilitation of weight shifting in all directions. Verbal and tactile cues to engage in hip and ankle strategies for balance. Pt reporting increased soreness in standing and performed NuStep to work on endurance and LE strengthening in decreased WB position. 10 min @ L4. Ambulated back to room w/ Min guard w/o AD and ended session in w/c, call bell within reach and all needs met.   Session 2:  Pt in w/c upon arrival and agreeable to therapy, c/o increased fatigue. Ambulated to/from therapy gym w/ RW and supervision 2/2 fatigue, ~150' each way. Worked on static  standing balance while performing puzzle w/ bilateral UEs and occasional unilateral UE support. Stood on blue wedge to promote ant tib ms activation in R>L LEs. Min guard for pt's comfort during task, no LOB. Returned to room and ended session in w/c, call bell within reach and all needs met.   Therapy Documentation Precautions:  Precautions Precautions: Fall Restrictions Weight Bearing Restrictions: No  See Function Navigator for Current Functional Status.   Therapy/Group: Individual Therapy  Hiram Mciver K Arnette 10/19/2017, 5:26 PM

## 2017-10-19 NOTE — Progress Notes (Signed)
Pt resting in bed quietly easily aroused. Requests xanax to assist in sleep promotion able to make needs known, flat affect with staff noted. Denies any pain at this time. callbell within reach. Safety maintained.  Will continue to monitor.

## 2017-10-19 NOTE — Progress Notes (Signed)
[facility] PHYS[facility] MEDICINE & REHABILITATION     PROGRESS NOTE  Subjective/Complaints:  Patient seen lying in bed this morning. She states she slept well overnight. She states she feels much better. She has questions about treating her discharge date.  ROS: Denies nausea, vomiting, diarrhea, shortness of breath or chest pain    Objective: Vital Signs: Blood pressure (!) 117/49, pulse (!) 59, temperature 97.9 F (36.6 C), temperature source Oral, resp. rate 17, height 5\' 7"  (1.702 m), weight 91 kg (200 lb 9.9 oz), SpO2 97 %. No results found. No results for input(s): WBC, HGB, HCT, PLT in the last 72 hours. No results for input(s): NA, K, CL, GLUCOSE, BUN, CREATININE, CALCIUM in the last 72 hours.  Invalid input(s): CO CBG (last 3)  Recent Labs    10/18/17 1609 10/18/17 2114 10/19/17 0622  GLUCAP 147* 138* 115*    Wt Readings from Last 3 Encounters:  10/19/17 91 kg (200 lb 9.9 oz)  10/11/17 105.4 kg (232 lb 5.8 oz)  09/28/17 101.2 kg (223 lb)    Physical Exam:  BP (!) 117/49 (BP Location: Right Arm)   Pulse (!) 59   Temp 97.9 F (36.6 C) (Oral)   Resp 17   Ht 5\' 7"  (1.702 m)   Wt 91 kg (200 lb 9.9 oz)   SpO2 97%   BMI 31.42 kg/m  Constitutional: She appears well-developed. Obese.   HENT: Normocephalic and atraumatic.  Eyes: EOM are normal. No discharge.  Cardiovascular: RRR. No JVD Respiratory: Normal breath sounds. Unlabored. GI: Bowel sounds are normal. She exhibits no distension.  Musculoskeletal: She exhibits no edema or tenderness.  Neurological: She is alert.  Makes good eye contact with examiner.  Followed simple commands.  Motor: RUE: 4+/5 proximal to distal RLE: 4+/5 HF, KE, ADF/PF (stable) LUE: 5/5 proximal to distal LLE: 5/5 proximal to distal Skin: Skin is warm and dry.  Psychiatric: She has a normal mood and affect. Her behavior is normal.   Assessment/Plan: 1. Functional deficits secondary to metastatic lung cancer with left frontal lobe  hemorrhagic metastatic lesion which require 3+ hours per day of interdisciplinary therapy in a comprehensive inpatient rehab setting. Physiatrist is providing close team supervision and 24 hour management of active medical problems listed below. Physiatrist and rehab team continue to assess barriers to discharge/monitor patient progress toward functional and medical goals.  Function:  Bathing Bathing position   Position: Shower  Bathing parts Body parts bathed by patient: Right arm, Left arm, Chest, Abdomen, Front perineal area, Right upper leg, Left upper leg, Left lower leg, Right lower leg, Buttocks Body parts bathed by helper: Back  Bathing assist Assist Level: Supervision or verbal cues, Touching or steadying assistance(Pt > 75%)      Upper Body Dressing/Undressing Upper body dressing   What is the patient wearing?: Pull over shirt/dress     Pull over shirt/dress - Perfomed by patient: Thread/unthread right sleeve, Thread/unthread left sleeve, Put head through opening, Pull shirt over trunk          Upper body assist Assist Level: Supervision or verbal cues      Lower Body Dressing/Undressing Lower body dressing   What is the patient wearing?: Non-skid slipper socks, Underwear, Pants Underwear - Performed by patient: Thread/unthread right underwear leg, Thread/unthread left underwear leg, Pull underwear up/down Underwear - Performed by helper: Pull underwear up/down Pants- Performed by patient: Thread/unthread left pants leg, Thread/unthread right pants leg, Pull pants up/down Pants- Performed by helper: Thread/unthread right pants  leg, Pull pants up/down Non-skid slipper socks- Performed by patient: Don/doff right sock, Don/doff left sock                    Lower body assist Assist for lower body dressing: Supervision or verbal cues      Toileting Toileting   Toileting steps completed by patient: Adjust clothing prior to toileting, Performs perineal hygiene,  Adjust clothing after toileting Toileting steps completed by helper: Adjust clothing prior to toileting, Adjust clothing after toileting Toileting Assistive Devices: Grab bar or rail  Toileting assist Assist level: More than reasonable time   Transfers Chair/bed transfer   Chair/bed transfer method: Stand pivot Chair/bed transfer assist level: Touching or steadying assistance (Pt > 75%) Chair/bed transfer assistive device: Armrests     Locomotion Ambulation     Max distance: 150' Assist level: Touching or steadying assistance (Pt > 75%)   Wheelchair     Max wheelchair distance: 100 Assist Level: Touching or steadying assistance (Pt > 75%)  Cognition Comprehension Comprehension assist level: Follows basic conversation/direction with no assist  Expression Expression assist level: Expresses basic needs/ideas: With no assist  Social Interaction Social Interaction assist level: Interacts appropriately with others with medication or extra time (anti-anxiety, antidepressant).  Problem Solving Problem solving assist level: Solves basic problems with no assist  Memory Memory assist level: Recognizes or recalls 75 - 89% of the time/requires cueing 10 - 24% of the time    Medical Problem List and Plan: 1.  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 Decadron added to decreased peri-infarct swelling   Cont CIR   Decreased decadron to 1 mg on 11/27 2.  DVT Prophylaxis/Anticoagulation: SCDs. Monitor for any signs of DVT.    Dopplers negative 3. Pain Management: Hydrocodone and Neurontin as needed   -baclofen prn for cramps/spasms 4. Mood: Xanax 0.25 mg daily as needed 5. Neuropsych: This patient is capable of making decisions on her own behalf. 6. Skin/Wound Care: Routine skin checks 7. Fluids/Electrolytes/Nutrition: Routine I&O's 8. Seizure prophylaxis. Keppra 500 mg twice a day 9. Diabetes mellitus and peripheral  neuropathy. Hemoglobin A1c 8.1.  Lantus insulin 40 units daily, Glucophage 500 mg daily. Check blood sugars before meals and at bedtime   Tradjenta 5 mg daily d/ced on 11/23.    NovoLog 4 units 3 times a day, increased to 7 units on 11/23     Relatively controlled on 11/27 10. Hypertension. Cozaar 25 mg daily, Toprol 25 mg daily, Norvasc 5 mg twice a day,Hydralazine 25 mg twice daily.    Controlled on 11/27 11. COPD with tobacco abuse. Continue inhalers. Provide counseling 12. CKD stage III. Creatinine baseline 1.28.    Cr 1.13 on November 23   Cont to monitor 13. Hyponatremia. Resolved   Na+ 136 on 11/23 14. Constipation. Laxative assistance 15. Hyperlipidemia. Crestor 16. Hypoalbuminemia   Supplement initiated on 11/20 17. Leukocytosis   WBCs 11.3 on 11/23   Likely steroid related   18. Cough   Improved   Chest x-ray reviewed, showing mass, but no acute changes  LOS (Days) 8 A FACE TO FACE EVALUATION WAS PERFORMED  Altin Sease Lorie Phenix 10/19/2017 8:50 AM

## 2017-10-20 ENCOUNTER — Inpatient Hospital Stay (HOSPITAL_COMMUNITY): Payer: Medicare HMO | Admitting: Physical Therapy

## 2017-10-20 ENCOUNTER — Inpatient Hospital Stay (HOSPITAL_COMMUNITY): Payer: Medicare HMO | Admitting: Occupational Therapy

## 2017-10-20 ENCOUNTER — Inpatient Hospital Stay (HOSPITAL_COMMUNITY): Payer: Medicare HMO | Admitting: Speech Pathology

## 2017-10-20 LAB — GLUCOSE, CAPILLARY
GLUCOSE-CAPILLARY: 139 mg/dL — AB (ref 65–99)
Glucose-Capillary: 109 mg/dL — ABNORMAL HIGH (ref 65–99)
Glucose-Capillary: 151 mg/dL — ABNORMAL HIGH (ref 65–99)
Glucose-Capillary: 87 mg/dL (ref 65–99)

## 2017-10-20 NOTE — Progress Notes (Signed)
[facility] PHYSICAL MEDICINE & REHABILITATION     PROGRESS NOTE  Subjective/Complaints:  Pt seen sitting up in her chair this AM.  She slept well overnight.  She has questions about discharge date again.   ROS: Denies nausea, vomiting, diarrhea, shortness of breath or chest pain    Objective: Vital Signs: Blood pressure (!) 128/57, pulse 68, temperature 98.3 F (36.8 C), temperature source Oral, resp. rate 17, height 5\' 7"  (1.702 m), weight 101.5 kg (223 lb 12.3 oz), SpO2 96 %. No results found. No results for input(s): WBC, HGB, HCT, PLT in the last 72 hours. No results for input(s): NA, K, CL, GLUCOSE, BUN, CREATININE, CALCIUM in the last 72 hours.  Invalid input(s): CO CBG (last 3)  Recent Labs    10/19/17 1645 10/19/17 2054 10/20/17 0555  GLUCAP 84 157* 139*    Wt Readings from Last 3 Encounters:  10/20/17 101.5 kg (223 lb 12.3 oz)  10/11/17 105.4 kg (232 lb 5.8 oz)  09/28/17 101.2 kg (223 lb)    Physical Exam:  BP (!) 128/57 (BP Location: Left Arm)   Pulse 68   Temp 98.3 F (36.8 C) (Oral)   Resp 17   Ht 5\' 7"  (1.702 m)   Wt 101.5 kg (223 lb 12.3 oz)   SpO2 96%   BMI 35.05 kg/m  Constitutional: She appears well-developed. Obese.   HENT: Normocephalic and atraumatic.  Eyes: EOM are normal. No discharge.  Cardiovascular: RRR. No JVD Respiratory: Normal breath sounds. Unlabored. GI: Bowel sounds are normal. She exhibits no distension.  Musculoskeletal: She exhibits no edema or tenderness.  Neurological: She is alert.  Makes good eye contact with examiner.  Followed simple commands.  Motor: RUE: 4+/5 proximal to distal RLE: 4+/5 HF, KE, ADF/PF (unchanged) LUE: 5/5 proximal to distal LLE: 5/5 proximal to distal Skin: Skin is warm and dry.  Psychiatric: She has a normal mood and affect. Her behavior is normal.   Assessment/Plan: 1. Functional deficits secondary to metastatic lung cancer with left frontal lobe hemorrhagic metastatic lesion which require 3+  hours per day of interdisciplinary therapy in a comprehensive inpatient rehab setting. Physiatrist is providing close team supervision and 24 hour management of active medical problems listed below. Physiatrist and rehab team continue to assess barriers to discharge/monitor patient progress toward functional and medical goals.  Function:  Bathing Bathing position   Position: Shower  Bathing parts Body parts bathed by patient: Right arm, Left arm, Chest, Abdomen, Front perineal area, Right upper leg, Left upper leg, Left lower leg, Right lower leg, Buttocks Body parts bathed by helper: Back  Bathing assist Assist Level: Supervision or verbal cues, Touching or steadying assistance(Pt > 75%)      Upper Body Dressing/Undressing Upper body dressing   What is the patient wearing?: Pull over shirt/dress     Pull over shirt/dress - Perfomed by patient: Thread/unthread right sleeve, Thread/unthread left sleeve, Put head through opening, Pull shirt over trunk          Upper body assist Assist Level: Set up   Set up : To obtain clothing/put away  Lower Body Dressing/Undressing Lower body dressing   What is the patient wearing?: Non-skid slipper socks, Underwear, Pants Underwear - Performed by patient: Thread/unthread right underwear leg, Thread/unthread left underwear leg, Pull underwear up/down Underwear - Performed by helper: Pull underwear up/down Pants- Performed by patient: Thread/unthread left pants leg, Thread/unthread right pants leg, Pull pants up/down Pants- Performed by helper: Thread/unthread right pants leg, Pull pants  up/down Non-skid slipper socks- Performed by patient: Don/doff right sock, Don/doff left sock                    Lower body assist Assist for lower body dressing: Supervision or verbal cues      Toileting Toileting   Toileting steps completed by patient: Adjust clothing after toileting, Performs perineal hygiene, Adjust clothing prior to  toileting Toileting steps completed by helper: Adjust clothing prior to toileting, Adjust clothing after toileting Toileting Assistive Devices: Grab bar or rail  Toileting assist Assist level: Supervision or verbal cues   Transfers Chair/bed transfer   Chair/bed transfer method: Stand pivot Chair/bed transfer assist level: Supervision or verbal cues Chair/bed transfer assistive device: Walker, Armrests, Bedrails     Locomotion Ambulation     Max distance: 150' Assist level: Touching or steadying assistance (Pt > 75%)   Wheelchair     Max wheelchair distance: 100 Assist Level: Touching or steadying assistance (Pt > 75%)  Cognition Comprehension Comprehension assist level: Follows complex conversation/direction with no assist  Expression Expression assist level: Expresses complex ideas: With no assist  Social Interaction Social Interaction assist level: Interacts appropriately with others - No medications needed.  Problem Solving Problem solving assist level: Solves complex 90% of the time/cues < 10% of the time, Solves basic problems with no assist  Memory Memory assist level: Recognizes or recalls 90% of the time/requires cueing < 10% of the time    Medical Problem List and Plan: 1.  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 Decadron added to decreased peri-infarct swelling   Cont CIR   Decreased decadron to 1 mg on 11/27 2.  DVT Prophylaxis/Anticoagulation: SCDs. Monitor for any signs of DVT.    Dopplers negative 3. Pain Management: Hydrocodone and Neurontin as needed   -baclofen prn for cramps/spasms 4. Mood: Xanax 0.25 mg daily as needed 5. Neuropsych: This patient is capable of making decisions on her own behalf. 6. Skin/Wound Care: Routine skin checks 7. Fluids/Electrolytes/Nutrition: Routine I&O's 8. Seizure prophylaxis. Keppra 500 mg twice a day 9. Diabetes mellitus and peripheral neuropathy.  Hemoglobin A1c 8.1.  Lantus insulin 40 units daily, Glucophage 500 mg daily. Check blood sugars before meals and at bedtime   Tradjenta 5 mg daily d/ced on 11/23.    NovoLog 4 units 3 times a day, increased to 7 units on 11/23     Relatively controlled on 11/28 10. Hypertension. Cozaar 25 mg daily, Toprol 25 mg daily, Norvasc 5 mg twice a day,Hydralazine 25 mg twice daily.    Controlled on 11/28 11. COPD with tobacco abuse. Continue inhalers. Provide counseling 12. CKD stage III. Creatinine baseline 1.28.    Cr 1.13 on November 23   Labs ordered for tomorrow   Cont to monitor 13. Hyponatremia. Resolved   Na+ 136 on 11/23 14. Constipation. Laxative assistance 15. Hyperlipidemia. Crestor 16. Hypoalbuminemia   Supplement initiated on 11/20 17. Leukocytosis   WBCs 11.3 on 11/23   Labs ordered for tomorrow   Likely steroid related   18. Cough   Improved   Chest x-ray reviewed, showing mass, but no acute changes  LOS (Days) 9 A FACE TO FACE EVALUATION WAS PERFORMED  Ankit Lorie Phenix 10/20/2017 8:49 AM

## 2017-10-20 NOTE — Progress Notes (Signed)
Pt weighed for validation. Bed zeroed out and weight obtained. Pt aware.

## 2017-10-20 NOTE — Progress Notes (Signed)
Pt resting in bed quietly. Easily aroused. Denies any pain at this time. Able to make needs known. Requests xanax and trazodone to get rest which was administered and effective. Call wiithin reach. Will continue to monitor.

## 2017-10-20 NOTE — Progress Notes (Signed)
Speech Language Pathology Daily Session Note  Patient Details  Name: [name] Nickell MRN: [id number] Date of Birth: 04/26/1946  Today's Date: 10/20/2017 SLP Individual Time: 1107-1202 SLP Individual Time Calculation (min): 55 min  Short Term Goals: Week 2: SLP Short Term Goal 1 (Week 2): STG=LTG due to ELOS  Skilled Therapeutic Interventions:   Pt was seen for skilled ST targeting cognitive goals.  SLP facilitated the session with medication management tasks to address problem solving and recall goals.  Pt was able to recall function of medications when named for ~75% accuracy with supervision.  She needed min assist verbal cues for problem solving when organizing pills of varying dosages and frequencies into a pill box which SLP suspects to be related to task novelty.  Pt reports that she does not feel back to her baseline for cognition but is much improved from initial evaluation.  Pt was left in recliner with call bell within reach.  Continue per current plan of care.    Function:  Eating Eating                 Cognition Comprehension Comprehension assist level: Follows complex conversation/direction with extra time/assistive device  Expression   Expression assist level: Expresses complex ideas: With no assist  Social Interaction Social Interaction assist level: Interacts appropriately with others with medication or extra time (anti-anxiety, antidepressant).  Problem Solving Problem solving assist level: Solves basic 90% of the time/requires cueing < 10% of the time  Memory Memory assist level: Recognizes or recalls 90% of the time/requires cueing < 10% of the time    Pain Pain Assessment Pain Assessment: No/denies pain  Therapy/Group: Individual Therapy  Mitchell Iwanicki, Selinda Orion 10/20/2017, 12:25 PM

## 2017-10-20 NOTE — Progress Notes (Signed)
Patient refused her inhaler. Said she didn't need it.

## 2017-10-20 NOTE — Progress Notes (Signed)
Occupational Therapy Session Note  Patient Details  Name: [name] MRN: [id number] Date of Birth: 1946/06/03  Today's Date: 10/20/2017 OT Individual Time: 8682-5749 OT Individual Time Calculation (min): 27 min    Short Term Goals: Week 2:  OT Short Term Goal 1 (Week 2): STG=LTG due to LOS  Skilled Therapeutic Interventions/Progress Updates:    Pt completed functional mobility to the dayroom with use of the RW and min guard assist.  Had pt complete 3 sets of 4 mins each using the UE ergonometer in order to increase strength in the RUE.  Two sets were completed peddling forward with isolated use of the RUE only.  RPMs maintained at 25 with resistance for all sets placed on level 8.  One set was completed peddling backwards at a slower rate 19-24.  Rest breaks of 2-3 mins between each set were needed.  Finished with functional mobility back to the room with min guard assist.  Pt left in the bedside recliner with call button and phone in reach.    Therapy Documentation Precautions:  Precautions Precautions: Fall Restrictions Weight Bearing Restrictions: No  Pain: Pain Assessment Pain Assessment: No/denies pain ADL: See Function Navigator for Current Functional Status.   Therapy/Group: Individual Therapy  Christophere Hillhouse OTR/L 10/20/2017, 4:25 PM

## 2017-10-20 NOTE — Progress Notes (Addendum)
Physical Therapy Note  Patient Details  Name: Marcea F Shaker MRN: [id number] Date of Birth: 22-Korissa-1947 Today's Date: 10/20/2017    Time: 830-926 56 minutes  1:1 No c/o pain.  Pt requests to shower. Pt performed gathering clothes, dressing/undressing including shoes and socks with supervision for gait and balance.  Shower seated on bench at mod I level.  Pt performs gait with RW with supervision 200'.  Gait trials with SPC with supervision for gait in controlled environment and with obstacle negotiation. Pt with good awareness of needs to rest and is able to take standing rest breaks when fatigued. less Rt foot drag noted today.   Time: 1340-1425 45 minutes  1:1 No c/o pain.  Gait throughout unit with [facility] with close supervision.  Standing balance/reaching task with decorating christmas tree with use of Rt hand to hang ornaments. Pt able to self correct LOB with use of SPC.  Standing balance squat and reach with horseshoe toss with pt able to perform with supervision.  Stair negotiation with 1 handrail 2 steps x 2 with min guard.  Stair negotiation 4 stairs x 2 with SPC and 1 handrail with min A, pt improved on second attempt.    DONAWERTH,KAREN 10/20/2017, 9:28 AM

## 2017-10-21 ENCOUNTER — Inpatient Hospital Stay (HOSPITAL_COMMUNITY): Payer: Medicare HMO | Admitting: Physical Therapy

## 2017-10-21 ENCOUNTER — Inpatient Hospital Stay (HOSPITAL_COMMUNITY): Payer: Medicare HMO | Admitting: Occupational Therapy

## 2017-10-21 ENCOUNTER — Inpatient Hospital Stay (HOSPITAL_COMMUNITY): Payer: Medicare HMO

## 2017-10-21 ENCOUNTER — Inpatient Hospital Stay (HOSPITAL_COMMUNITY): Payer: Medicare HMO | Admitting: Speech Pathology

## 2017-10-21 LAB — CBC WITH DIFFERENTIAL/PLATELET
BASOS PCT: 1 %
Basophils Absolute: 0.1 10*3/uL (ref 0.0–0.1)
Eosinophils Absolute: 0.1 10*3/uL (ref 0.0–0.7)
Eosinophils Relative: 1 %
HEMATOCRIT: 39.5 % (ref 36.0–46.0)
HEMOGLOBIN: 12.7 g/dL (ref 12.0–15.0)
Lymphocytes Relative: 37 %
Lymphs Abs: 3.8 10*3/uL (ref 0.7–4.0)
MCH: 29.2 pg (ref 26.0–34.0)
MCHC: 32.2 g/dL (ref 30.0–36.0)
MCV: 90.8 fL (ref 78.0–100.0)
Monocytes Absolute: 1 10*3/uL (ref 0.1–1.0)
Monocytes Relative: 9 %
NEUTROS ABS: 5.3 10*3/uL (ref 1.7–7.7)
NEUTROS PCT: 52 %
Platelets: 369 10*3/uL (ref 150–400)
RBC: 4.35 MIL/uL (ref 3.87–5.11)
RDW: 13.9 % (ref 11.5–15.5)
WBC: 10.2 10*3/uL (ref 4.0–10.5)

## 2017-10-21 LAB — BASIC METABOLIC PANEL
Anion gap: 6 (ref 5–15)
BUN: 25 mg/dL — ABNORMAL HIGH (ref 6–20)
CALCIUM: 9.2 mg/dL (ref 8.9–10.3)
CO2: 28 mmol/L (ref 22–32)
Chloride: 103 mmol/L (ref 101–111)
Creatinine, Ser: 1.08 mg/dL — ABNORMAL HIGH (ref 0.44–1.00)
GFR, EST AFRICAN AMERICAN: 59 mL/min — AB (ref >60)
GFR, EST NON AFRICAN AMERICAN: 51 mL/min — AB (ref >60)
Glucose, Bld: 82 mg/dL (ref 65–99)
Potassium: 3.9 mmol/L (ref 3.5–5.1)
SODIUM: 137 mmol/L (ref 135–145)

## 2017-10-21 LAB — GLUCOSE, CAPILLARY
GLUCOSE-CAPILLARY: 72 mg/dL (ref 65–99)
GLUCOSE-CAPILLARY: 158 mg/dL — AB (ref 65–99)
Glucose-Capillary: 147 mg/dL — ABNORMAL HIGH (ref 65–99)
Glucose-Capillary: 164 mg/dL — ABNORMAL HIGH (ref 65–99)

## 2017-10-21 MED ORDER — DEXAMETHASONE 2 MG PO TABS
1.0000 mg | ORAL_TABLET | Freq: Every day | ORAL | Status: AC
Start: 2017-10-21 — End: 2017-10-21
  Administered 2017-10-21: 1 mg via ORAL

## 2017-10-21 MED ORDER — ALUM & MAG HYDROXIDE-SIMETH 200-200-20 MG/5ML PO SUSP
15.0000 mL | Freq: Four times a day (QID) | ORAL | Status: DC | PRN
  Administered 2017-10-21: 15 mL via ORAL
  Filled 2017-10-21: qty 30

## 2017-10-21 MED ORDER — INSULIN GLARGINE 100 UNIT/ML ~~LOC~~ SOLN
20.0000 [IU] | Freq: Every day | SUBCUTANEOUS | Status: DC
  Administered 2017-10-21 – 2017-10-22 (×2): 20 [IU] via SUBCUTANEOUS
  Filled 2017-10-21 (×2): qty 0.2

## 2017-10-21 NOTE — Progress Notes (Signed)
Physical Therapy Note  Patient Details  Name: [name] MRN: [id number] Date of Birth: [birth year] Today's Date: 10/21/2017  tx 1:  1030-1130, 60 min individual tx Pain: none per pt  Pt c/o indigestion/heartburn; premedicated.  PT urged pt to drink sips of water; heartburn lessened.  Gait training with SPC on level tile x 150' with supervision. Cues for reaching back before sitting.  Seated neuromuscular re-education via demo and multimodal cues for seated: 20 x 1 bil glut sets, marching, bil heel raises.  Pt stated she feels more flexible after exs. Gait up/down (4) 6" high steps, SPC in L hand, R hand on rail as per home set-up, ascending and descending with RLe (pt selection), step- to pattern.  Therapeutic activity kicking [place] block with RLE to target 12' away focusing on clearing R toes and velocity, and kicking with L foot focusing on straight path of block during R SLS.  Advanced gait stepping backwards with SPC in L hand, kicking cones with lateral side of L foot, to fully load RLE; pt fatigued after 6 cones.   Pt transferred back to recliner in room; bil LEs elevated.   All needs at hand.  tx 2:  6122-4497, 45 min individual tx Pain: none per pt  Dtrs [name] and [name] here for family ed for gait on level terrain, steps, simulated car transfer, basic transfer.  [name] trained and return demonstrated supervising gait on level terrain, simulated car transfer, 4 steps with L SPc and R rail.  [name] trained in basic transfer and car transfer.  PT educated pt and family on energy conservation/activity tolerance, recommending pt get into bed after lunch QD at home,  to rest/sleep, rather than falling asleep in armchair. [name] is knowledgeable about w/c parts mgt as she has a dtr who uses a w/c. Pt left resting in recliner with all needs within reach, and dtrs present.  See function navigator for current status.  Calixto Pavel 10/21/2017, 10:53 AM

## 2017-10-21 NOTE — Progress Notes (Signed)
[place] PHYSICAL MEDICINE & REHABILITATION     PROGRESS NOTE  Subjective/Complaints:  Pt seen laying in bed this AM.  She slept well overnight.  She is excited about being discharged earlier than anticipated.  She states she continues to improve.   ROS: Denies nausea, vomiting, diarrhea, shortness of breath or chest pain    Objective: Vital Signs: Blood pressure (!) 114/55, pulse 60, temperature 99 F (37.2 C), temperature source Oral, resp. rate 18, height 5\' 7"  (1.702 m), weight 101.5 kg (223 lb 12.3 oz), SpO2 98 %. No results found. No results for input(s): WBC, HGB, HCT, PLT in the last 72 hours. No results for input(s): NA, K, CL, GLUCOSE, BUN, CREATININE, CALCIUM in the last 72 hours.  Invalid input(s): CO CBG (last 3)  Recent Labs    10/20/17 1707 10/20/17 2101 10/21/17 0642  GLUCAP 151* 87 72    Wt Readings from Last 3 Encounters:  10/20/17 101.5 kg (223 lb 12.3 oz)  10/11/17 105.4 kg (232 lb 5.8 oz)  09/28/17 101.2 kg (223 lb)    Physical Exam:  BP (!) 114/55 (BP Location: Right Arm)   Pulse 60   Temp 99 F (37.2 C) (Oral)   Resp 18   Ht 5\' 7"  (1.702 m)   Wt 101.5 kg (223 lb 12.3 oz)   SpO2 98%   BMI 35.05 kg/m  Constitutional: She appears well-developed. Obese.   HENT: Normocephalic and atraumatic.  Eyes: EOM are normal. No discharge.  Cardiovascular: RRR. No JVD Respiratory: Normal breath sounds. Unlabored. GI: Bowel sounds are normal. She exhibits no distension.  Musculoskeletal: She exhibits no edema or tenderness.  Neurological: She is alert.  Makes good eye contact with examiner.  Followed simple commands.  Motor: RUE: 4+/5 proximal to distal RLE: 4+/5 HF, KE, ADF/PF (continues to improve) LUE: 5/5 proximal to distal LLE: 5/5 proximal to distal Skin: Skin is warm and dry.  Psychiatric: She has a normal mood and affect. Her behavior is normal.   Assessment/Plan: 1. Functional deficits secondary to metastatic lung cancer with left frontal  lobe hemorrhagic metastatic lesion which require 3+ hours per day of interdisciplinary therapy in a comprehensive inpatient rehab setting. Physiatrist is providing close team supervision and 24 hour management of active medical problems listed below. Physiatrist and rehab team continue to assess barriers to discharge/monitor patient progress toward functional and medical goals.  Function:  Bathing Bathing position   Position: Shower  Bathing parts Body parts bathed by patient: Right arm, Left arm, Chest, Abdomen, Front perineal area, Right upper leg, Left upper leg, Left lower leg, Right lower leg, Buttocks Body parts bathed by helper: Back  Bathing assist Assist Level: Supervision or verbal cues, Touching or steadying assistance(Pt > 75%)      Upper Body Dressing/Undressing Upper body dressing   What is the patient wearing?: Pull over shirt/dress     Pull over shirt/dress - Perfomed by patient: Thread/unthread right sleeve, Thread/unthread left sleeve, Put head through opening, Pull shirt over trunk          Upper body assist Assist Level: Set up   Set up : To obtain clothing/put away  Lower Body Dressing/Undressing Lower body dressing   What is the patient wearing?: Non-skid slipper socks, Underwear, Pants Underwear - Performed by patient: Thread/unthread right underwear leg, Thread/unthread left underwear leg, Pull underwear up/down Underwear - Performed by helper: Pull underwear up/down Pants- Performed by patient: Thread/unthread left pants leg, Thread/unthread right pants leg, Pull pants up/down Pants-  Performed by helper: Thread/unthread right pants leg, Pull pants up/down Non-skid slipper socks- Performed by patient: Don/doff right sock, Don/doff left sock                    Lower body assist Assist for lower body dressing: Supervision or verbal cues      Toileting Toileting   Toileting steps completed by patient: Adjust clothing prior to toileting, Performs  perineal hygiene, Adjust clothing after toileting Toileting steps completed by helper: Adjust clothing prior to toileting, Adjust clothing after toileting Toileting Assistive Devices: Grab bar or rail  Toileting assist Assist level: Supervision or verbal cues   Transfers Chair/bed transfer   Chair/bed transfer method: Stand pivot Chair/bed transfer assist level: Supervision or verbal cues Chair/bed transfer assistive device: Walker, Armrests, Bedrails     Locomotion Ambulation     Max distance: 50 ' Assist level: Supervision or verbal cues   Wheelchair     Max wheelchair distance: 100 Assist Level: Touching or steadying assistance (Pt > 75%)  Cognition Comprehension Comprehension assist level: Follows complex conversation/direction with extra time/assistive device  Expression Expression assist level: Expresses complex ideas: With no assist  Social Interaction Social Interaction assist level: Interacts appropriately with others with medication or extra time (anti-anxiety, antidepressant).  Problem Solving Problem solving assist level: Solves basic 90% of the time/requires cueing < 10% of the time  Memory Memory assist level: Recognizes or recalls 90% of the time/requires cueing < 10% of the time    Medical Problem List and Plan: 1.  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 Decadron added to decreased peri-infarct swelling   Cont CIR   Decreased decadron 11/27, d/c tomorrow 2.  DVT Prophylaxis/Anticoagulation: SCDs. Monitor for any signs of DVT.    Dopplers negative 3. Pain Management: Hydrocodone and Neurontin as needed   -baclofen prn for cramps/spasms 4. Mood: Xanax 0.25 mg daily as needed 5. Neuropsych: This patient is capable of making decisions on her own behalf. 6. Skin/Wound Care: Routine skin checks 7. Fluids/Electrolytes/Nutrition: Routine I&O's 8. Seizure prophylaxis. Keppra 500 mg twice a day 9.  Diabetes mellitus and peripheral neuropathy. Hemoglobin A1c 8.1.     Lantus insulin 40 units daily decreased to 20U on 11/29, Briseyda need to decrease further with d/c in steroids   Glucophage 500 mg daily.    Check blood sugars before meals and at bedtime   Tradjenta 5 mg daily d/ced on 11/23.    NovoLog d/ced on 11/29 due to d/c in steroids    Trending down on 11/29 10. Hypertension. Cozaar 25 mg daily, Toprol 25 mg daily, Norvasc 5 mg twice a day,Hydralazine 25 mg twice daily.    Controlled on 11/29 11. COPD with tobacco abuse. Continue inhalers. Provide counseling 12. CKD stage III. Creatinine baseline 1.28.    Cr 1.13 on November 23   Labs pending   Cont to monitor 13. Hyponatremia. Resolved   Na+ 136 on 11/23 14. Constipation. Laxative assistance 15. Hyperlipidemia. Crestor 16. Hypoalbuminemia   Supplement initiated on 11/20 17. Leukocytosis   WBCs 11.3 on 11/23   Labs pending   Likely steroid related   18. Cough   Improved   Chest x-ray reviewed, showing mass, but no acute changes  LOS (Days) 10 A FACE TO FACE EVALUATION WAS PERFORMED  Amando Chaput Lorie Phenix 10/21/2017 8:43 AM

## 2017-10-21 NOTE — Progress Notes (Signed)
Occupational Therapy Session Note  Patient Details  Name: [name] MRN: [id number] Date of Birth: 1946/03/28  Today's Date: 10/21/2017 OT Individual Time: 2119-4174 OT Individual Time Calculation (min): 41 min    Short Term Goals: Week 2:  OT Short Term Goal 1 (Week 2): STG=LTG due to LOS  Skilled Therapeutic Interventions/Progress Updates:    Upon entering the room, pt seated in wheelchair with RN present giving medications. Pt reports, " I am exhausted." However, pt requesting to shower and pt on gown. Pt ambulating in room with supervision with use of SPC to gather all needed items for self care tasks. Pt seated on TTB to remove clothing items and bathing at shower level while seated on TTB with overall supervision. Pt donning clothing items from seated position on edge of TTB with supervision as well. Pt returning to bed secondary to fatigue. OT discussed OT recommendation for equipment and home health intervention with pt asking questions and verbalizing understanding. Pt supine in bed with bed alarm activated and call bell within reach upon exiting the room.   Therapy Documentation Precautions:  Precautions Precautions: Fall Restrictions Weight Bearing Restrictions: No General:   Vital Signs: Therapy Vitals Temp: 98 F (36.7 C) Temp Source: Oral Pulse Rate: 80 Resp: 18 BP: 120/61 Patient Position (if appropriate): Sitting Oxygen Therapy SpO2: 95 % O2 Device: Not Delivered Pain: Pain Assessment Pain Assessment: No/denies pain ADL: ADL ADL Comments: please see functional navigator  Vision   Perception    Praxis   Exercises:   Other Treatments:    See Function Navigator for Current Functional Status.   Therapy/Group: Individual Therapy  Gypsy Decant 10/21/2017, 3:54 PM

## 2017-10-21 NOTE — Progress Notes (Signed)
Speech Language Pathology Daily Session Note  Patient Details  Name: [name] MRN: [id number] Date of Birth: 1946-08-11  Today's Date: 10/21/2017 SLP Individual Time: 1345-1427 SLP Individual Time Calculation (min): 42 min  Short Term Goals: Week 2: SLP Short Term Goal 1 (Week 2): STG=LTG due to ELOS  Skilled Therapeutic Interventions:  Pt was seen for skilled ST targeting family education and cognitive goals.  Pt's daughters were present for the beginning of today's therapy session but left shortly after therapist's arrival.  Therapist was able to complete education regarding recommendations that pt have assistance for medication and financial management upon discharge as well as skilled instruction for memory compensatory strategies.  Pt's daughters verbalized understanding and were in agreement.  They plan to come back tomorrow for OT family training.  Therapist facilitated the session with a novel card game targeting memory compensatory strategies:specifically word-picture associations.  Pt generated and recalled 3 out of 4 associations, improving to 4 out of 4 with min assist.  Pt then named specific category members during generative naming portion of task and recalled previously named items after an ~5 minute delay for 100% accuracy with mod I.  Pt was noticeably more flat today and reported being fatigued.  Pt was left in recliner with call bell within reach.  Continue per current plan of care.    Function:  Eating Eating                 Cognition Comprehension Comprehension assist level: Follows complex conversation/direction with extra time/assistive device  Expression   Expression assist level: Expresses complex ideas: With extra time/assistive device  Social Interaction Social Interaction assist level: Interacts appropriately with others with medication or extra time (anti-anxiety, antidepressant).  Problem Solving Problem solving assist level: Solves basic problems with  no assist  Memory Memory assist level: Recognizes or recalls 90% of the time/requires cueing < 10% of the time    Pain Pain Assessment Pain Assessment: No/denies pain  Therapy/Group: Individual Therapy  Brylee Mcgreal, Selinda Orion 10/21/2017, 3:43 PM

## 2017-10-21 NOTE — Patient Care Conference (Signed)
Inpatient RehabilitationTeam Conference and Plan of Care Update Date: 10/20/2017   Time: 2:35 PM    Patient Name: [name]      Medical Record Number: [id number]  Date of Birth: October 28, 1946 Sex: Female         Room/Bed: 4W11C/4W11C-01 Payor Info: Payor: [employer] MEDICARE / Plan: [employer] MEDICARE HMO/PPO / Product Type: *No Product type* /    Admitting Diagnosis: Lung Ca with Lung mets to the brain  Admit Date/Time:  10/11/2017  4:45 PM Admission Comments: No comment available   Primary Diagnosis:  <principal problem not specified> Principal Problem: <principal problem not specified>  Patient Active Problem List   Diagnosis Date Noted  . Cerebral edema (HCC)   . Muscle spasm   . Diabetes mellitus type 2 in obese ([place])   . Labile blood glucose   . Cough   . Hypoalbuminemia due to protein-calorie malnutrition ([place])   . Leukocytosis   . Metastatic lung cancer (metastasis from lung to other site) ([place]) 10/11/2017  . Brain metastasis ([place])   . Seizure prophylaxis   . Hyperlipidemia   . Slow transit constipation   . Hyponatremia   . Stage 3 chronic kidney disease ([place])   . Type 2 diabetes mellitus with peripheral neuropathy (HCC)   . Benign essential HTN   . Tobacco abuse   . 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 GERD (gastroesophageal reflux disease) 08/16/2017  . Lung mass 08/16/2017  . Shortness of breath 07/05/2017    Class: Acute  . Vaginal odor 02/10/2017  . Dysuria 02/10/2017  . Osteoarthritis of spine with radiculopathy, cervical region 12/25/2016  . COPD (chronic obstructive pulmonary disease) ([place]) 10/11/2015  . Orthostatic hypotension 06/18/2013  . Numbness and tingling in right hand 11/10/2011    Class: Acute  . Hypertension 11/10/2011  . Cervical disc disease 11/10/2011    Class: Chronic  . DM II (diabetes mellitus, type II), controlled ([place]) 11/10/2011    Class: Chronic    Expected Discharge Date: Expected Discharge Date: 10/23/17  Team Members  Present: Physician leading conference: Dr. Delice Lesch Social Worker Present: Lennart Pall, LCSW Nurse Present: Other (comment)(Angelina Vanessa DuPage, RN) PT Present: Roderic Ovens, PT OT Present: Willeen Cass, OT SLP Present: Windell Moulding, SLP PPS Coordinator present : Daiva Nakayama, RN, CRRN     Current Status/Progress Goal Weekly Team Focus  Medical   Right hemiparesis secondary to metastatic lung cancer with left frontal lobe hemorrhagic metastatic lesion.   Improve mobility, DM/HTN, leukocytosis, cerebral edema  See above   Bowel/Bladder   continent of b/b  maintain continence      Swallow/Nutrition/ Hydration   regular diet with thin liquids; takes medicines whole with thin liquids  monitor for weight loss weekly      ADL's   Supervision-min A overall  Supervision overall  ADL re-training, functional activity tolerance, R Neuro re-ed.    Mobility   min A gait, supervision bed mobility  supervision overall  NMR, gait, transfers, family ed   Communication   verbal communication that is clear to send and receive messages  maintain clear speech      Safety/Cognition/ Behavioral Observations  supervision   mod I   family education, semi-complex problem solving, memory, awareness    Pain   denies any pain at this time  maintain comfort      Skin   currently intact; obese with slight edem noted to bilat lower ext  maintain skin that is free from any noted skin  breakdown       Rehab Goals Patient on target to meet rehab goals: Yes *See Care Plan and progress notes for long and short-term goals.     Barriers to Discharge  Current Status/Progress Possible Resolutions Date Resolved   Physician    Medical stability;Other (comments)  cerebral edema  See above  Therapies, optimize DM/HTN meds, follow labs, wean decadron      Nursing                  PT                    OT                  SLP                SW                Discharge Planning/Teaching Needs:  Plan to  d/c home with spouse and family able to provide 24/7 assistance.  Will schedule family ed this week.   Team Discussion:  Decreasing steroids;  Cont b/b.  Making excellent gains and team feels could d/c earlier.  Supervision with cane.  Family ed for tomorrow.  Revisions to Treatment Plan:  Change in d/c date to 12/1    Continued Need for Acute Rehabilitation Level of Care: The patient requires daily medical management by a physician with specialized training in physical medicine and rehabilitation for the following conditions: Daily direction of a multidisciplinary physical rehabilitation program to ensure safe treatment while eliciting the highest outcome that is of practical value to the patient.: Yes Daily medical management of patient stability for increased activity during participation in an intensive rehabilitation regime.: Yes Daily analysis of laboratory values and/or radiology reports with any subsequent need for medication adjustment of medical intervention for : Diabetes problems;Blood pressure problems;Other  Brody Kump 10/21/2017, 2:29 PM

## 2017-10-21 NOTE — Progress Notes (Signed)
Social Work Patient ID: [name], female   DOB: 1946-07-16, 71 y.o.   MRN: [id number]   Have reviewed team conference information with pt and two daughters who are aware of earlier d/c on 12/1 and happy with progress being made.  Daughters in today and completed family ed with PT and ST.  Have requested they come back tomorrow at 11:00 for OT session.  Arranging follow up and DME.  Continue to follow.  Evann Erazo, LCSW

## 2017-10-22 ENCOUNTER — Inpatient Hospital Stay (HOSPITAL_COMMUNITY): Payer: Medicare HMO | Admitting: Speech Pathology

## 2017-10-22 ENCOUNTER — Inpatient Hospital Stay (HOSPITAL_COMMUNITY): Payer: Medicare HMO | Admitting: Physical Therapy

## 2017-10-22 ENCOUNTER — Inpatient Hospital Stay (HOSPITAL_COMMUNITY): Payer: Medicare HMO | Admitting: Occupational Therapy

## 2017-10-22 LAB — GLUCOSE, CAPILLARY
GLUCOSE-CAPILLARY: 90 mg/dL (ref 65–99)
GLUCOSE-CAPILLARY: 145 mg/dL — AB (ref 65–99)
GLUCOSE-CAPILLARY: 121 mg/dL — AB (ref 65–99)
GLUCOSE-CAPILLARY: 144 mg/dL — AB (ref 65–99)

## 2017-10-22 MED ORDER — ROSUVASTATIN CALCIUM 10 MG PO TABS: 10.0000 mg | ORAL_TABLET | Freq: Every day | ORAL | 0 refills | Status: AC

## 2017-10-22 MED ORDER — ALPRAZOLAM 0.25 MG PO TABS: 0.2500 mg | ORAL_TABLET | Freq: Three times a day (TID) | ORAL | 0 refills | Status: DC | PRN

## 2017-10-22 MED ORDER — METOPROLOL SUCCINATE ER 25 MG PO TB24: 25.0000 mg | ORAL_TABLET | Freq: Every day | ORAL | 1 refills | Status: DC

## 2017-10-22 MED ORDER — METFORMIN HCL 500 MG PO TABS: 500.0000 mg | ORAL_TABLET | Freq: Every day | ORAL | 0 refills | Status: DC

## 2017-10-22 MED ORDER — TRAZODONE HCL 50 MG PO TABS: 50.0000 mg | ORAL_TABLET | Freq: Every day | ORAL | 0 refills | Status: DC

## 2017-10-22 MED ORDER — MECLIZINE HCL 25 MG PO TABS
25.0000 mg | ORAL_TABLET | Freq: Three times a day (TID) | ORAL | 0 refills | Status: AC
Start: 2017-10-22 — End: ?

## 2017-10-22 MED ORDER — GABAPENTIN 100 MG PO CAPS: 100.0000 mg | ORAL_CAPSULE | Freq: Every day | ORAL | 1 refills | Status: DC | PRN

## 2017-10-22 MED ORDER — UMECLIDINIUM BROMIDE 62.5 MCG/INH IN AEPB: 1.0000 | INHALATION_SPRAY | Freq: Every day | RESPIRATORY_TRACT | 1 refills | Status: AC

## 2017-10-22 MED ORDER — ESOMEPRAZOLE MAGNESIUM 40 MG PO CPDR: 40.0000 mg | DELAYED_RELEASE_CAPSULE | Freq: Every day | ORAL | 2 refills | Status: AC | PRN

## 2017-10-22 MED ORDER — CITALOPRAM HYDROBROMIDE 10 MG PO TABS: 5.0000 mg | ORAL_TABLET | Freq: Every day | ORAL | 0 refills | Status: DC

## 2017-10-22 MED ORDER — LEVETIRACETAM 500 MG PO TABS: 500.0000 mg | ORAL_TABLET | Freq: Two times a day (BID) | ORAL | 3 refills | Status: AC

## 2017-10-22 MED ORDER — LOSARTAN POTASSIUM 25 MG PO TABS: 25.0000 mg | ORAL_TABLET | Freq: Every day | ORAL | 3 refills | Status: AC

## 2017-10-22 MED ORDER — HYDROCODONE-ACETAMINOPHEN 5-325 MG PO TABS: 1.0000 | ORAL_TABLET | ORAL | 0 refills | Status: AC | PRN

## 2017-10-22 MED ORDER — INSULIN GLARGINE 100 UNITS/ML SOLOSTAR PEN: 20.0000 [IU] | PEN_INJECTOR | Freq: Every day | SUBCUTANEOUS | 11 refills | Status: DC

## 2017-10-22 MED ORDER — POTASSIUM CHLORIDE CRYS ER 10 MEQ PO TBCR: 10.0000 meq | EXTENDED_RELEASE_TABLET | Freq: Every day | ORAL | 0 refills | Status: DC

## 2017-10-22 NOTE — Discharge Instructions (Signed)
Inpatient Rehab Discharge Instructions  [name] Discharge date and time: No discharge date for patient encounter.   Activities/Precautions/ Functional Status: Activity: activity as tolerated Diet: regular diet Wound Care: none needed Functional status:  ___ No restrictions     ___ Walk up steps independently ___ 24/7 supervision/assistance   ___ Walk up steps with assistance ___ Intermittent supervision/assistance  ___ Bathe/dress independently ___ Walk with walker     _x__ Bathe/dress with assistance ___ Walk Independently    ___ Shower independently ___ Walk with assistance    ___ Shower with assistance ___ No alcohol     ___ Return to work/school ________    COMMUNITY REFERRALS UPON DISCHARGE:    Home Health:   PT     OT     ST                     Agency:  [facility] Phone: [phone]   Medical Equipment/Items Ordered: wheelchair, cushion, cane, commode and tub bench                                                     Agency/Supplier:  [facility] @ [phone]   GENERAL COMMUNITY RESOURCES FOR PATIENT/FAMILY:  Support Groups: available through the [facility]       Special Instructions: No driving smoking or alcohol   My questions have been answered and I understand these instructions. I will adhere to these goals and the provided educational materials after my discharge from the hospital.  Patient/Caregiver Signature _______________________________ Date __________  Clinician Signature _______________________________________ Date __________  Please bring this form and your medication list with you to all your follow-up doctor's appointments.

## 2017-10-22 NOTE — Progress Notes (Signed)
[place] PHYSICAL MEDICINE & REHABILITATION     PROGRESS NOTE  Subjective/Complaints:  Patient is up with therapy ambulating.  Excited about discharge tomorrow  ROS: pt denies nausea, vomiting, diarrhea, cough, shortness of breath or chest pain    Objective: Vital Signs: Blood pressure (!) 123/54, pulse 70, temperature 97.8 F (36.6 C), temperature source Oral, resp. rate 18, height 5\' 7"  (1.702 m), weight 101.5 kg (223 lb 12.3 oz), SpO2 96 %. No results found. Recent Labs    10/21/17 0803  WBC 10.2  HGB 12.7  HCT 39.5  PLT 369   Recent Labs    10/21/17 0803  NA 137  K 3.9  CL 103  GLUCOSE 82  BUN 25*  CREATININE 1.08*  CALCIUM 9.2   CBG (last 3)  Recent Labs    10/21/17 1641 10/21/17 2113 10/22/17 0620  GLUCAP 164* 158* 90    Wt Readings from Last 3 Encounters:  10/20/17 101.5 kg (223 lb 12.3 oz)  10/11/17 105.4 kg (232 lb 5.8 oz)  09/28/17 101.2 kg (223 lb)    Physical Exam:  BP (!) 123/54 (BP Location: Right Arm)   Pulse 70   Temp 97.8 F (36.6 C) (Oral)   Resp 18   Ht 5\' 7"  (1.702 m)   Wt 101.5 kg (223 lb 12.3 oz)   SpO2 96%   BMI 35.05 kg/m  Constitutional: She appears well-developed. Obese.   HENT: Normocephalic and atraumatic.  Eyes: EOM are normal. No discharge.  Cardiovascular: RRR without murmur. No JVD  Respiratory: CTA Bilaterally without wheezes or rales. Normal effort  GI: Bowel sounds are normal. She exhibits no distension.  Musculoskeletal: She exhibits no edema or tenderness.  Neurological: She is alert.  Makes good eye contact with examiner.  Followed simple commands.  Motor: RUE: 4+/5 proximal to distal RLE: 4+/5 HF, KE, ADF/PF gradually improving LUE: 5/5 proximal to distal LLE: 5/5 proximal to distal Skin: Skin is warm and dry.  Psychiatric: She has a normal mood and affect. Her behavior is normal.   Assessment/Plan: 1. Functional deficits secondary to metastatic lung cancer with left frontal lobe hemorrhagic  metastatic lesion which require 3+ hours per day of interdisciplinary therapy in a comprehensive inpatient rehab setting. Physiatrist is providing close team supervision and 24 hour management of active medical problems listed below. Physiatrist and rehab team continue to assess barriers to discharge/monitor patient progress toward functional and medical goals.  Function:  Bathing Bathing position   Position: Shower  Bathing parts Body parts bathed by patient: Right arm, Left arm, Chest, Abdomen, Front perineal area, Right upper leg, Left upper leg, Left lower leg, Right lower leg, Buttocks Body parts bathed by helper: Back  Bathing assist Assist Level: Supervision or verbal cues, Touching or steadying assistance(Pt > 75%)      Upper Body Dressing/Undressing Upper body dressing   What is the patient wearing?: Hospital gown     Pull over shirt/dress - Perfomed by patient: Thread/unthread right sleeve, Thread/unthread left sleeve, Put head through opening          Upper body assist Assist Level: Supervision or verbal cues   Set up : To obtain clothing/put away  Lower Body Dressing/Undressing Lower body dressing   What is the patient wearing?: Non-skid slipper socks, Underwear Underwear - Performed by patient: Thread/unthread right underwear leg, Thread/unthread left underwear leg, Pull underwear up/down Underwear - Performed by helper: Pull underwear up/down Pants- Performed by patient: Thread/unthread left pants leg, Thread/unthread right pants leg,  Pull pants up/down Pants- Performed by helper: Thread/unthread right pants leg, Pull pants up/down Non-skid slipper socks- Performed by patient: Don/doff right sock, Don/doff left sock                    Lower body assist Assist for lower body dressing: Supervision or verbal cues      Toileting Toileting   Toileting steps completed by patient: Adjust clothing prior to toileting, Performs perineal hygiene, Adjust clothing  after toileting Toileting steps completed by helper: Adjust clothing prior to toileting, Adjust clothing after toileting Toileting Assistive Devices: Grab bar or rail  Toileting assist Assist level: Supervision or verbal cues   Transfers Chair/bed transfer   Chair/bed transfer method: Ambulatory Chair/bed transfer assist level: No Help, no cues, assistive device, takes more than a reasonable amount of time Chair/bed transfer assistive device: Librarian, academic     Max distance: 150 Assist level: Supervision or verbal cues   Wheelchair     Max wheelchair distance: 100 Assist Level: Touching or steadying assistance (Pt > 75%)  Cognition Comprehension Comprehension assist level: Follows complex conversation/direction with extra time/assistive device  Expression Expression assist level: Expresses complex ideas: With extra time/assistive device  Social Interaction Social Interaction assist level: Interacts appropriately with others with medication or extra time (anti-anxiety, antidepressant).  Problem Solving Problem solving assist level: Solves basic problems with no assist  Memory Memory assist level: Recognizes or recalls 90% of the time/requires cueing < 10% of the time    Medical Problem List and Plan: 1.  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 Decadron added to decreased peri-infarct swelling   Cont CIR   Decreased decadron 11/27    -Discharge home tomorrow.  Transitional care follow-up in 1-2 weeks 2.  DVT Prophylaxis/Anticoagulation: SCDs. Monitor for any signs of DVT.    Dopplers negative 3. Pain Management: Hydrocodone and Neurontin as needed   -baclofen prn for cramps/spasms 4. Mood: Xanax 0.25 mg daily as needed 5. Neuropsych: This patient is capable of making decisions on her own behalf. 6. Skin/Wound Care: Routine skin checks 7. Fluids/Electrolytes/Nutrition: Routine I&O's 8. Seizure  prophylaxis. Keppra 500 mg twice a day 9. Diabetes mellitus and peripheral neuropathy. Hemoglobin A1c 8.1.     Lantus insulin 40 units daily decreased to 20U on 11/29, Aalliyah need to decrease further with d/c in steroids   Glucophage 500 mg daily.    Check blood sugars before meals and at bedtime   Tradjenta 5 mg daily d/ced on 11/23.    NovoLog d/ced on 11/29 due to d/c in steroids    Trending down on 11/30 10. Hypertension. Cozaar 25 mg daily, Toprol 25 mg daily, Norvasc 5 mg twice a day,Hydralazine 25 mg twice daily.    Controlled on 11/30 11. COPD with tobacco abuse. Continue inhalers. Provide counseling 12. CKD stage III. Creatinine baseline 1.28.    Cr 1.13 on November 23   Labs pending   Cont to monitor 13. Hyponatremia. Resolved   Na+ 137on 11/29 14. Constipation. Laxative assistance 15. Hyperlipidemia. Crestor 16. Hypoalbuminemia   Supplement initiated on 11/20 17. Leukocytosis   WBCs 10.2    Likely steroid related   18. Cough   Improved   Chest x-ray reviewed, showing mass, but no acute changes  LOS (Days) 11 A FACE TO FACE EVALUATION WAS PERFORMED  SWARTZ,ZACHARY T 10/22/2017 9:32 AM

## 2017-10-22 NOTE — Discharge Summary (Signed)
Discharge summary job [phone]

## 2017-10-22 NOTE — Progress Notes (Signed)
Physical Therapy Discharge Summary  Patient Details  Name: [name] MRN: [id number] Date of Birth: [birth year]  Today's Date: 10/22/2017 PT Individual Time: 0830-0911 PT Individual Time Calculation (min): 41 min   Pt performed gait throughout unit in home and controlled environments and uneven surfaces with SPC with supervision.  Stair negotiation with SPC and 1 handrail x 8 stairs with supervision.  [place] exercises performed with 2# wts bilat with pt requiring tactile cues for Rt hip abductor activation.  nustep for UE/LE strength and endurance x 8 minutes level 4.  Pt performs transfers to chair and nustep with mod I. Pt states she feels ready to d/c home tomorrow.   Patient has met 9 of 9 long term goals due to improved activity tolerance, improved balance, improved postural control, increased strength, ability to compensate for deficits and functional use of  right upper extremity and right lower extremity.  Patient to discharge at an ambulatory level Supervision.   Patient's care partner is independent to provide the necessary supervision assistance at discharge.  Reasons goals not met: n/a  Recommendation:  Patient will benefit from ongoing skilled PT services in home health setting to continue to advance safe functional mobility, address ongoing impairments in balance, gait, and minimize fall risk.  Equipment: SPC, w/c  Reasons for discharge: treatment goals met and discharge from hospital  Patient/family agrees with progress made and goals achieved: Yes  PT Discharge Precautions/Restrictions Precautions Precautions: Fall Restrictions Weight Bearing Restrictions: No Pain Pain Assessment Pain Assessment: No/denies pain Pain Score: 0-No pain  Cognition Overall Cognitive Status: Within Functional Limits for tasks assessed Sensation Sensation Light Touch: Appears Intact Proprioception: Appears Intact Coordination Gross Motor Movements are Fluid and Coordinated:  No Heel Shin Test: slowed and weaker on R Motor  Motor Motor: Hemiplegia Motor - Discharge Observations: Rt sided weakness   Trunk/Postural Assessment  Cervical Assessment Cervical Assessment: Within Functional Limits Thoracic Assessment Thoracic Assessment: Within Functional Limits Lumbar Assessment Lumbar Assessment: Within Functional Limits Postural Control Postural Control: Within Functional Limits  Balance Dynamic Standing Balance Dynamic Standing - Level of Assistance: 5: Stand by assistance Extremity Assessment      RLE Strength RLE Overall Strength Comments: hip flex 4/5, knee ext 4/5, DF 3/5, PF 3/5 LLE Assessment LLE Assessment: Within Functional Limits   See Function Navigator for Current Functional Status.  Sunday Klos 10/22/2017, 9:01 AM

## 2017-10-22 NOTE — Progress Notes (Signed)
Speech Language Pathology Discharge Summary  Patient Details  Name: [name] MRN: [id number] Date of Birth: 06/14/1946  Today's Date: 10/22/2017 SLP Individual Time: 0930-1026 SLP Individual Time Calculation (min): 56 min   Skilled Therapeutic Interventions:  Pt was seen for skilled ST targeting cognitive goals.  SLP administered the Cognistat to measure progress from initial evaluation.  Pt demonstrated mild impairments in attention, problem solving, and math calculation subtests and severe impairment on memory subtest.  SLP suspects that memory impairment findings are exacerbated and localized to evaluation specific tasks versus broader dysfunction as pt is able to recall semi-complex daily information with supervision verbal cues.  Discussed findings of assessment with pt who reports that she still feels as if her memory is not back to baseline.  SLP reviewed and reinforced memory compensatory strategies.  Pt was left in recliner with call bell within reach.  Education is complete at this time.  Pt is ready for discharge.       Patient has met 2 of 3 long term goals.  Patient to discharge at overall Supervision;Modified Independent level.  Reasons goals not met:     Clinical Impression/Discharge Summary:   Pt has made functional gains while inpatient and has met 2 out of 3 long term goals.  She has demonstrated improved attention, awareness, and problem solving but needs supervision for recall of complex information due to ongoing memory deficits which pt reports to be different from baseline.  Pt and family education is complete.  Pt is discharging home with 24/7 supervision from family and recommendations for brief ST follow up at next level of care.    Care Partner:  Caregiver Able to Provide Assistance: Yes  Type of Caregiver Assistance: Physical;Cognitive  Recommendation:  Home Health SLP  Rationale for SLP Follow Up: Maximize cognitive function and independence   Equipment:  none recommended by SLP    Reasons for discharge: Discharged from hospital   Patient/Family Agrees with Progress Made and Goals Achieved: Yes   Function:  Eating Eating     Eating Assist Level: More than reasonable amount of time           Cognition Comprehension Comprehension assist level: Follows complex conversation/direction with extra time/assistive device  Expression   Expression assist level: Expresses complex ideas: With extra time/assistive device  Social Interaction Social Interaction assist level: Interacts appropriately with others with medication or extra time (anti-anxiety, antidepressant).  Problem Solving Problem solving assist level: Solves complex 90% of the time/cues < 10% of the time  Memory Memory assist level: Recognizes or recalls 90% of the time/requires cueing < 10% of the time   Emilio Math 10/22/2017, 2:11 PM

## 2017-10-22 NOTE — Progress Notes (Signed)
Occupational Therapy Session Note  Patient Details  Name: [name] MRN: [id number] Date of Birth: August 23, 1946  Today's Date: 10/22/2017 OT Individual Time: 1100-1155 OT Individual Time Calculation (min): 55 min    Short Term Goals: Week 1:  OT Short Term Goal 1 (Week 1): Pt will complete toilet transfer with MinA.  OT Short Term Goal 1 - Progress (Week 1): Met OT Short Term Goal 2 (Week 1): Pt will complete 2/3 parts of LB dressing. OT Short Term Goal 2 - Progress (Week 1): Met OT Short Term Goal 3 (Week 1): Pt will demonstrate hemi techniques for completing UB dressing with min verbal cues.  OT Short Term Goal 3 - Progress (Week 1): Met Week 2:  OT Short Term Goal 1 (Week 2): STG=LTG due to LOS  Skilled Therapeutic Interventions/Progress Updates:   Pt seen for BADL retraining of toileting, bathing, and dressing with a focus on safe functional mobility, activity tolerance, standing balance.  Pt was able to ambulate in room with [facility] to gather all her supplies, then into bathroom to toilet then to shower.  She completed bathing and then ambulated back to recliner to dress.  Pt is demonstrating excellent safety awareness and improved balance to complete room transfers and all self care with mod I, EXCEPT she needs to have Supervision with getting in and out of tub here and at home. Pt understands this.  Pt now has a fully functional RUE with no limitations in her daily care.  Pt is now mod I in the room.  Pt in room with all needs met.       Therapy Documentation Precautions:  Precautions Precautions: Fall Restrictions Weight Bearing Restrictions: No    Vital Signs: Oxygen Therapy SpO2: 94 % O2 Device: Not Delivered Pain: Pain Assessment Pain Assessment: No/denies pain Pain Score: 0-No pain ADL: ADL ADL Comments: please see functional navigator  Vision Baseline Vision/History: Wears glasses Wears Glasses: At all times Patient Visual Report: Blurring of vision;Other  (comment) Tracking/Visual Pursuits: Decreased smoothness of horizontal tracking;Decreased smoothness of vertical tracking;Decreased smoothness of eye movement to RIGHT superior field;Decreased smoothness of eye movement to RIGHT inferior field Perception  Perception: Within Functional Limits Praxis Praxis: Intact  See Function Navigator for Current Functional Status.   Therapy/Group: Individual Therapy  [place] 10/22/2017, 11:27 AM

## 2017-10-22 NOTE — Discharge Summary (Signed)
NAMESKYLER, [name]                   ACCOUNT NO.:  [id number]  MEDICAL RECORD NO.:  [id number]  LOCATION:  [id number]                        FACILITY:  [facility]  PHYSICIAN:  Delice Lesch, MD        DATE OF BIRTH:  1946-02-28  DATE OF ADMISSION:  10/11/2017 DATE OF DISCHARGE:  10/23/2017                              DISCHARGE SUMMARY   DISCHARGE DIAGNOSES: 1. Left frontal lobe hemorrhagic metastatic lesion. 2. Sequential compression devices for deep vein thrombosis     prophylaxis. 3. Pain management. 4. Anxiety. 5. Seizure prophylaxis. 6. Diabetes mellitus. 7. Hypertension. 8. Chronic obstructive pulmonary disease. 9. Chronic kidney disease stage 3. 10.Hyponatremia, resolved. 11.Constipation, resolved. 12.Hyperlipidemia. 13.Decreased nutritional storage. 14.Leukocytosis, related to steroids.  This is a 71 year old right-handed female, history of CKD stage 3, COPD, tobacco abuse.  Lives with spouse.  Spouse works during the day.  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 Julien Nordmann of Oncology Services.  Had been scheduled for VATS procedure.  Nodule with metastatic lesion in left cerebral hemisphere and noted progressive weakness of right side.  MRI of the brain showed left frontal lesion, 10- mm left posterior frontal mass, moderate vasogenic edema.  Placed on Decadron.  Underwent CT-guided core needle biopsy of right upper lung nodule on September 24, 2017.  Intermittent bouts of altered mental status.  EEG negative for seizure.  Maintained on Keppra for seizure prophylaxis.  Oncology to follow up as an outpatient.  Latest CT showed superior left frontal lobe hemorrhagic metastasis with vasogenic edema. She spiked a low-grade fever.  Rocephin was added to regimen.  Blood cultures negative.  Physical and occupational therapy  ongoing.  The patient was admitted for comprehensive rehab program.  PAST MEDICAL HISTORY:  See discharge diagnoses.  SOCIAL HISTORY:  Lives with spouse, independent prior to admission.  FUNCTIONAL STATUS UPON ADMISSION TO REHAB SERVICES:  +2 physical assist 5-feet rolling walker, +2 physical assist sit to stand, min to mod assist activities of daily living.  PHYSICAL EXAMINATION:  VITAL SIGNS:  Blood pressure 130/60, pulse 71, temperature 98, and respirations 18. GENERAL:  Alert female.  Made good eye contact with examiner.  Followed commands. HEENT:  EOMs intact. NECK:  Supple.  Nontender.  No JVD. CARDIAC:  Regular rate and rhythm without murmur. ABDOMEN:  Soft, nontender.  Good bowel sounds. LUNGS:  Clear to auscultation. EXTREMITIES:  Right upper extremity 3+/5 proximal to distal, right lower extremity 2/5 hip flexors, 2+/5 knee extension, ankle dorsi and plantarflexion.  REHABILITATION HOSPITAL COURSE:  The patient was admitted to Inpatient Rehab Services with therapies initiated on a 3-hour daily basis, consisting of physical therapy, occupational therapy, speech therapy, and rehabilitation nursing.  Following issues were addressed during the patient's rehabilitation stay.  Pertaining to [name]'s metastatic lung cancer, left frontal lobe hemorrhagic metastatic lesion, she completed Decadron protocol.  She would follow up with outpatient Oncology as well as Neurology Services.  SCDs for DVT prophylaxis. Venous Doppler studies negative.  Pain management with the use of Neurontin and hydrocodone  as needed.  Seizure prophylaxis with Keppra 500 mg twice daily.  Diabetes mellitus, peripheral neuropathy, hemoglobin A1c of 8.1, currently on Lantus insulin as well as Glucophage.  She had been on Tradjenta 5 mg daily, discontinued on October 15, 2017.  Close monitoring of blood sugars while on steroid use.  Blood pressure is controlled with current regimen.  She would follow  up with her primary MD.  No orthostatic blood pressure.  She did have a history of COPD with tobacco abuse.  No increasing shortness of breath.  She remained on inhalers.  She received full counseling in regard to cessation of nicotine products.  CKD stage 3, baseline creatinine 1.28, latest creatinine 1.13.  Hyponatremia, resolved, 136. Bouts of constipation, resolved with laxative assistance.  She remained on Crestor for hyperlipidemia.  The patient received weekly collaborative interdisciplinary team conferences to discuss estimated length of stay, family teaching, any barriers to her discharge.  She was ambulating 150 feet, straight-point cane and supervision, needing some cues for reaching back before sitting, working with energy conservation techniques, up and down stairs, straight-point cane and descending stairs with supervision, advanced gait stepping backwards with straight- point cane in left hand.  She could kick cones and go around obstacles. Gather belongings for activities of daily living and homemaking. Ambulates to her room with supervision.  Full family teaching was completed and plan discharge to home.  She will continue on a regular diet.  DISCHARGE MEDICATIONS:  Included: 1. Celexa 5 mg p.o. daily. 2. Lantus insulin 20 units at bedtime. 3. Keppra 500 mg p.o. b.i.d. 4. Cozaar 25 mg p.o. daily. 5. Antivert 25 mg p.o. t.i.d. 6. Glucophage 500 mg p.o. daily. 7. Toprol-XL 25 mg p.o. daily. 8. Protonix 40 mg p.o. daily. 9. Crestor 10 mg p.o. daily. 10.Desyrel 50 mg p.o. at bedtime. 11.Incruse-Ellipta 1 puff daily. 12.Xanax 0.25 mg p.o. 3 times daily as needed. 13.Neurontin 100 mg daily as needed. 14.Hydrocodone 1 tablet every 4 hours needed.  DIET:  Her diet was regular.  FOLLOWUP:  The patient would follow up with Dr. Delice Lesch at the Outpatient Rehab Service office as advised; Dr. Doylene Canard, Medical Management; Dr. Curt Bears, Oncology  Services.     [name], P.A.   ______________________________ Delice Lesch, MD    DA/MEDQ  D:  10/22/2017  T:  10/22/2017  Job:  [id number]  cc:   Delice Lesch, MD Eilleen Kempf, M.D. Birdie Riddle, M.D.

## 2017-10-22 NOTE — Progress Notes (Signed)
Occupational Therapy Session Note  Patient Details  Name: Elene F Byus MRN: [id number] Date of Birth: 1945-12-14  Today's Date: 10/22/2017 OT Individual Time: 1330-1430 OT Individual Time Calculation (min): 60 min   Skilled Therapeutic Interventions/Progress Updates:     1:1 Therapeutic activities with focus on standing balance. Performed BERG scoring 49/56- recommending to still use her [facility] for balance and functional ambulation.  Performed modified OTAGO exercises Level B with close supervision. Also education on fall recovery and performed floor transfer with supervision. Also provided handouts on home safety recommendations and LEvel A OTAGO exercises to perform at home.  Pt left in room at mod I level.    Therapy Documentation Precautions:  Precautions Precautions: Fall Restrictions Weight Bearing Restrictions: No Pain:  no c/o pain   See Function Navigator for Current Functional Status.   Therapy/Group: Individual Therapy  Willeen Cass [facility] 10/22/2017, 1:56 PM

## 2017-10-23 ENCOUNTER — Other Ambulatory Visit: Payer: Self-pay

## 2017-10-23 LAB — GLUCOSE, CAPILLARY: Glucose-Capillary: 93 mg/dL (ref 65–99)

## 2017-10-23 NOTE — Progress Notes (Signed)
[place] PHYSICAL MEDICINE & REHABILITATION     PROGRESS NOTE  Subjective/Complaints:  Feels well Eager to go home  Objective: Vital Signs: Blood pressure (!) 120/59, pulse 68, temperature 98.3 F (36.8 C), temperature source Oral, resp. rate 16, height 5\' 7"  (1.702 m), weight 221 lb 11.2 oz (100.6 kg), SpO2 96 %.   Assessment/Plan: 1. Functional deficits secondary to metastatic lung cancer with left frontal lobe hemorrhagic metastatic lesion    Medical Problem List and Plan: 1.  Right hemiparesis secondary to metastatic lung cancer with left frontal lobe hemorrhagic metastatic lesion Ok fo rdischarge. She understands plans 8. Seizure prophylaxis. Keppra 500 mg twice a day 9. Diabetes mellitus and peripheral neuropathy.  CBG (last 3)  Recent Labs    10/22/17 1706 10/22/17 2102 10/23/17 0653  GLUCAP 121* 144* 93  will need close f/u (steroids)  10. Hypertension. 102/80-120/59 11. COPD with tobacco abuse.  12. CKD stage III. Creatinine baseline 1.28.     Lab Results  Component Value Date   CREATININE 1.08 (H) 10/21/2017    13. Hyponatremia. resolved    14. 15. Hyperlipidemia. Crestor 16. Hypoalbuminemia   Supplement initiated on 11/20 17. Leukocytosis   resolved Lab Results  Component Value Date   WBC 10.2 10/21/2017    18. Cough   Resolved (patient denies)  LOS (Days) 12 A FACE TO FACE EVALUATION WAS PERFORMED  Bruce H Swords 10/23/2017 8:26 AM

## 2017-10-23 NOTE — Progress Notes (Signed)
Discharged to home per wheelchair. With sister, equipment and belongings.

## 2017-10-25 ENCOUNTER — Telehealth: Payer: Self-pay

## 2017-10-25 NOTE — Telephone Encounter (Signed)
Transitional care call, tried to leave message but patients voice mail was full.

## 2017-10-25 NOTE — Telephone Encounter (Signed)
Attempted to contact patient for 2nd time, no answer on first 3 numbers and directly to voicemail on remainder of them

## 2017-10-25 NOTE — Progress Notes (Signed)
Social Work  Discharge Note  The overall goal for the admission was met for:   Discharge location: Yes - home with daughters to provide 24/7 assistance  Length of Stay: Yes  - 12 days  Discharge activity level: Yes - supervision  Home/community participation: Yes  Services provided included: MD, RD, PT, OT, SLP, RN, TR, Pharmacy and [place]: [employer]  Follow-up services arranged: Home Health: PT, OT, ST via [facility], DME: 18x18 lighweight w/c, cushion, cane, 3n1 commode and tub bench via [facility] and Patient/Family has no preference for HH/DME agencies  Comments (or additional information):  Patient/Family verbalized understanding of follow-up arrangements: Yes  Individual responsible for coordination of the follow-up plan: pt  Confirmed correct DME delivered: [name] 10/25/2017    Nurah Petrides

## 2017-10-26 ENCOUNTER — Ambulatory Visit: Payer: Medicare HMO | Admitting: Thoracic Surgery (Cardiothoracic Vascular Surgery)

## 2017-10-26 ENCOUNTER — Other Ambulatory Visit: Payer: Self-pay

## 2017-10-26 ENCOUNTER — Encounter: Payer: Self-pay | Admitting: Thoracic Surgery (Cardiothoracic Vascular Surgery)

## 2017-10-26 VITALS — BP 134/72 | HR 88 | Ht 67.0 in | Wt 223.0 lb

## 2017-10-26 DIAGNOSIS — C7931 Secondary malignant neoplasm of brain: Secondary | ICD-10-CM | POA: Diagnosis not present

## 2017-10-26 DIAGNOSIS — C3490 Malignant neoplasm of unspecified part of unspecified bronchus or lung: Secondary | ICD-10-CM | POA: Diagnosis not present

## 2017-10-26 DIAGNOSIS — C3491 Malignant neoplasm of unspecified part of right bronchus or lung: Secondary | ICD-10-CM

## 2017-10-26 NOTE — Progress Notes (Signed)
[address]       [place] [address]             [phone]     HPI: [name] returns for follow-up of her right upper lobe lung cancer.  She is a 71 year old woman with a past medical history of tobacco abuse, COPD, hypertension, diabetes mellitus, anxiety and depression, vertigo, and chronic neck pain.  She presented with a complaint of increased cough and shortness of breath.  A chest x-ray showed a 2.5 x 1.7 cm opacity.  A CT showed a 2.6 x 1.9 cm spiculated right upper lobe nodule with some central cavitation.  On PET this was hypermetabolic with an SUV of 18.  There was no evidence of regional or distant metastases.  She was scheduled for surgical resection but presented with a complaint of heaviness in her right leg and numbness in her right arm on the day of the procedure.  A CT of the head showed a left frontal lobe metastasis with surrounding vasogenic edema.  She was started on steroids.  She had radiation to the brain met with 20 Gy in 1 fraction using SBRT/SRT-3d technique on 09/30/2017.  She presented to the emergency room on 10/02/2017 with complaints of right arm and leg weakness and right arm numbness.  She went to inpatient rehab on 10/11/2017 and was discharged from there on 10/23/2017.  She was reported to be ambulating 150 feet with a cane prior to discharge although today she indicates that she could maybe go 30 feet.  She is working with home physical therapy there is scheduled to come 2 times a week.  Past Medical History:  Diagnosis Date  . Anxiety   . Arthritis   . Cancer ([place])   . Chronic back pain greater than 3 months duration   . Chronic neck pain   . COPD (chronic obstructive pulmonary disease) ([place])   . Cough   . Diabetes mellitus   . Dyspnea   . GERD (gastroesophageal reflux disease)   . H/O hiatal hernia   . Headache(784.0)   . Hemorrhoids   . Herniated disc   . History of kidney stones   . Hx of echocardiogram 2010   normal  EF  . Hypertension   . Palpitations   . Shortness of breath on exertion 11/10/11   "sometimes"  . Vertigo   . Vertigo     Current Outpatient Medications  Medication Sig Dispense Refill  . acetaminophen (TYLENOL) 500 MG tablet Take 1 tablet (500 mg total) every 6 (six) hours as needed by mouth for fever. 30 tablet 0  . ALPRAZolam (XANAX) 0.25 MG tablet Take 1 tablet (0.25 mg total) by mouth 3 (three) times daily as needed for anxiety. 30 tablet 0  . amLODipine (NORVASC) 5 MG tablet Take 5 mg by mouth 2 (two) times daily.  1  . citalopram (CELEXA) 10 MG tablet Take 0.5 tablets (5 mg total) by mouth daily. 30 tablet 0  . esomeprazole (NEXIUM) 40 MG capsule Take 1 capsule (40 mg total) by mouth daily as needed (acid reflux). 30 capsule 2  . gabapentin (NEURONTIN) 100 MG capsule Take 1 capsule (100 mg total) by mouth daily as needed (for leg pain). 30 capsule 1  . HYDROcodone-acetaminophen (NORCO/VICODIN) 5-325 MG tablet Take 1 tablet by mouth every 4 (four) hours as needed for moderate pain or severe pain (As needed for pain.). 20 tablet 0  . JANUMET 50-500 MG tablet  Take 1 tablet by mouth 2 (two) times daily.  1  . levETIRAcetam (KEPPRA) 500 MG tablet Take 1 tablet (500 mg total) by mouth 2 (two) times daily. 60 tablet 3  . loratadine (CLARITIN) 10 MG tablet Take 10 mg by mouth daily as needed for allergies.    Marland Kitchen losartan (COZAAR) 25 MG tablet Take 1 tablet (25 mg total) by mouth daily. 30 tablet 3  . meclizine (ANTIVERT) 25 MG tablet Take 1 tablet (25 mg total) by mouth 3 (three) times daily. 90 tablet 0  . metoprolol succinate (TOPROL-XL) 25 MG 24 hr tablet Take 1 tablet (25 mg total) by mouth daily. 30 tablet 1  . ondansetron (ZOFRAN ODT) 4 MG disintegrating tablet Take 1 tablet (4 mg total) by mouth every 8 (eight) hours as needed for nausea or vomiting. (Patient taking differently: Take 4 mg by mouth every 8 (eight) hours as needed for nausea or vomiting. DISSOLVE IN THE MOUTH) 20 tablet 0    . polyethylene glycol (MIRALAX / GLYCOLAX) packet Take 17 g daily as needed by mouth for moderate constipation. 14 each 0  . potassium chloride (KLOR-CON M10) 10 MEQ tablet Take 1 tablet (10 mEq total) by mouth daily. 30 tablet 0  . rosuvastatin (CRESTOR) 10 MG tablet Take 1 tablet (10 mg total) by mouth daily. 30 tablet 0  . traZODone (DESYREL) 50 MG tablet Take 1 tablet (50 mg total) by mouth at bedtime. 30 tablet 0  . umeclidinium bromide (INCRUSE ELLIPTA) 62.5 MCG/INH AEPB Inhale 1 puff into the lungs daily. 1 each 1  . albuterol (PROVENTIL) (2.5 MG/3ML) 0.083% nebulizer solution USE 1 VIAL EVERY 6 HOURS AS NEEDED FOR SHORTNESS OF BREATH  0  . albuterol (VENTOLIN HFA) 108 (90 Base) MCG/ACT inhaler Inhale 2 puffs into the lungs every 6 (six) hours as needed for wheezing or shortness of breath.    . cholecalciferol (VITAMIN D) 1000 units tablet Take 1,000 Units by mouth daily.    Glory Rosebush VERIO test strip USE AS DIRECTED EVERY DAY  3   No current facility-administered medications for this visit.    Facility-Administered Medications Ordered in Other Visits  Medication Dose Route Frequency Provider Last Rate Last Dose  . fentaNYL (SUBLIMAZE) injection    Anesthesia Intra-op Candis Shine, CRNA   50 mcg at 09/09/17 0718  . lactated ringers infusion    Continuous PRN Harder, Rebeca Alert, CRNA      . lactated ringers infusion    Continuous PRN Harder, Rebeca Alert, CRNA      . 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  She had stereotactic radiation to the brain metastasis about a month ago.  She presented back with worsening weakness and was in the hospital for about 9 days prior to going to rehab for a couple of weeks.  She now is  back at home.  Her functional status is not anywhere near what she would need to be to tolerate a lobectomy at this point in time.  I do think there is a possibility she can get there in the relatively near future if she continues to work with physical therapy and also work independent of physical therapy.  I will plan to see her back in 4 weeks.  We will do a chest x-ray at that time.  We will also need to do a 6-minute walk test.  She likely will need repeat chest imaging prior to surgery.    Melrose Nakayama, MD [facility] [phone]

## 2017-10-28 ENCOUNTER — Other Ambulatory Visit (HOSPITAL_COMMUNITY)
Admission: RE | Admit: 2017-10-28 | Discharge: 2017-10-28 | Disposition: A | Discharge status: Home / Self Care | Payer: Medicare HMO | Source: Ambulatory Visit | Attending: Internal Medicine | Admitting: Internal Medicine

## 2017-10-28 ENCOUNTER — Encounter: Payer: Self-pay | Admitting: *Deleted

## 2017-10-28 ENCOUNTER — Ambulatory Visit (HOSPITAL_BASED_OUTPATIENT_CLINIC_OR_DEPARTMENT_OTHER): Payer: Medicare HMO | Admitting: Oncology

## 2017-10-28 ENCOUNTER — Encounter: Payer: Self-pay | Admitting: Oncology

## 2017-10-28 ENCOUNTER — Telehealth: Payer: Self-pay | Admitting: Internal Medicine

## 2017-10-28 ENCOUNTER — Other Ambulatory Visit (HOSPITAL_BASED_OUTPATIENT_CLINIC_OR_DEPARTMENT_OTHER): Payer: Medicare HMO

## 2017-10-28 VITALS — BP 143/67 | HR 99 | Temp 98.6°F | Resp 18 | Ht 67.0 in | Wt 223.0 lb

## 2017-10-28 DIAGNOSIS — C3492 Malignant neoplasm of unspecified part of left bronchus or lung: Secondary | ICD-10-CM

## 2017-10-28 DIAGNOSIS — C3411 Malignant neoplasm of upper lobe, right bronchus or lung: Secondary | ICD-10-CM | POA: Diagnosis not present

## 2017-10-28 DIAGNOSIS — C7931 Secondary malignant neoplasm of brain: Secondary | ICD-10-CM

## 2017-10-28 DIAGNOSIS — R918 Other nonspecific abnormal finding of lung field: Secondary | ICD-10-CM | POA: Diagnosis not present

## 2017-10-28 LAB — COMPREHENSIVE METABOLIC PANEL
ALT: 8 U/L (ref 0–55)
AST: 11 U/L (ref 5–34)
Albumin: 2.9 g/dL — ABNORMAL LOW (ref 3.5–5.0)
Alkaline Phosphatase: 145 U/L (ref 40–150)
Anion Gap: 11 mEq/L (ref 3–11)
BUN: 15.4 mg/dL (ref 7.0–26.0)
CHLORIDE: 105 meq/L (ref 98–109)
CO2: 24 meq/L (ref 22–29)
Calcium: 10.4 mg/dL (ref 8.4–10.4)
Creatinine: 1.1 mg/dL (ref 0.6–1.1)
EGFR: 58 mL/min/{1.73_m2} — AB (ref >60)
GLUCOSE: 164 mg/dL — AB (ref 70–140)
POTASSIUM: 3.8 meq/L (ref 3.5–5.1)
Sodium: 139 mEq/L (ref 136–145)
Total Bilirubin: 0.43 mg/dL (ref 0.20–1.20)
Total Protein: 7.4 g/dL (ref 6.4–8.3)

## 2017-10-28 LAB — CBC WITH DIFFERENTIAL/PLATELET
BASO%: 0.1 % (ref 0.0–2.0)
Basophils Absolute: 0 10*3/uL (ref 0.0–0.1)
EOS%: 3.7 % (ref 0.0–7.0)
Eosinophils Absolute: 0.3 10*3/uL (ref 0.0–0.5)
HCT: 39.6 % (ref 34.8–46.6)
HGB: 13 g/dL (ref 11.6–15.9)
LYMPH%: 30.9 % (ref 14.0–49.7)
MCH: 29.1 pg (ref 25.1–34.0)
MCHC: 32.7 g/dL (ref 31.5–36.0)
MCV: 89 fL (ref 79.5–101.0)
MONO#: 0.8 10*3/uL (ref 0.1–0.9)
MONO%: 9.9 % (ref 0.0–14.0)
NEUT#: 4.7 10*3/uL (ref 1.5–6.5)
NEUT%: 55.4 % (ref 38.4–76.8)
PLATELETS: 346 10*3/uL (ref 145–400)
RBC: 4.45 10*6/uL (ref 3.70–5.45)
RDW: 13.8 % (ref 11.2–14.5)
WBC: 8.5 10*3/uL (ref 3.9–10.3)
lymph#: 2.6 10*3/uL (ref 0.9–3.3)

## 2017-10-28 NOTE — Progress Notes (Signed)
`1[facility] OFFICE PROGRESS NOTE  Dixie Dials, MD [place] [address]  DIAGNOSIS: highly suspicious stage IV (T1b, N0, M1C) lung cancer poorly differentiated non-small cell lung cancer, presented with right upper lobe lung nodule in addition to solitary brain metastasis.  PRIOR THERAPY: Stereotactic radiation to the brain lesion on 09/30/2017.  CURRENT THERAPY: None  INTERVAL HISTORY: [name] 71 y.o. female returns for routine follow-up visit with her family member.  The patient was recently discharged from rehabilitation and is now doing physical therapy twice a week at home.  The patient reports that she is feeling stronger.  She denies fevers and chills.  Denies chest pain, shortness breath, cough, hemoptysis.  Denies nausea, vomiting, constipation, diarrhea.  Patient reports that she is able to walk around the house with a walker.  Patient is here for repeat blood work and discussion of treatment options.  MEDICAL HISTORY: Past Medical History:  Diagnosis Date  . Anxiety   . Arthritis   . Cancer ([place])   . Chronic back pain greater than 3 months duration   . Chronic neck pain   . COPD (chronic obstructive pulmonary disease) ([place])   . Cough   . Diabetes mellitus   . Dyspnea   . GERD (gastroesophageal reflux disease)   . H/O hiatal hernia   . Headache(784.0)   . Hemorrhoids   . Herniated disc   . History of kidney stones   . Hx of echocardiogram 2010   normal EF  . Hypertension   . Palpitations   . Shortness of breath on exertion 11/10/11   "sometimes"  . Vertigo   . Vertigo     ALLERGIES:  is allergic to oxycodone.  MEDICATIONS:  Current Outpatient Medications  Medication Sig Dispense Refill  . acetaminophen (TYLENOL) 500 MG tablet Take 1 tablet (500 mg total) every 6 (six) hours as needed by mouth for fever. 30 tablet 0  . albuterol (PROVENTIL) (2.5 MG/3ML) 0.083% nebulizer solution USE 1 VIAL EVERY 6 HOURS AS NEEDED FOR  SHORTNESS OF BREATH  0  . albuterol (VENTOLIN HFA) 108 (90 Base) MCG/ACT inhaler Inhale 2 puffs into the lungs every 6 (six) hours as needed for wheezing or shortness of breath.    . ALPRAZolam (XANAX) 0.25 MG tablet Take 1 tablet (0.25 mg total) by mouth 3 (three) times daily as needed for anxiety. 30 tablet 0  . amLODipine (NORVASC) 5 MG tablet Take 5 mg by mouth 2 (two) times daily.  1  . cholecalciferol (VITAMIN D) 1000 units tablet Take 1,000 Units by mouth daily.    . citalopram (CELEXA) 10 MG tablet Take 0.5 tablets (5 mg total) by mouth daily. 30 tablet 0  . esomeprazole (NEXIUM) 40 MG capsule Take 1 capsule (40 mg total) by mouth daily as needed (acid reflux). 30 capsule 2  . gabapentin (NEURONTIN) 100 MG capsule Take 1 capsule (100 mg total) by mouth daily as needed (for leg pain). 30 capsule 1  . HYDROcodone-acetaminophen (NORCO/VICODIN) 5-325 MG tablet Take 1 tablet by mouth every 4 (four) hours as needed for moderate pain or severe pain (As needed for pain.). 20 tablet 0  . JANUMET 50-500 MG tablet Take 1 tablet by mouth 2 (two) times daily.  1  . levETIRAcetam (KEPPRA) 500 MG tablet Take 1 tablet (500 mg total) by mouth 2 (two) times daily. 60 tablet 3  . loratadine (CLARITIN) 10 MG tablet Take 10 mg by mouth daily as needed for  allergies.    Marland Kitchen losartan (COZAAR) 25 MG tablet Take 1 tablet (25 mg total) by mouth daily. 30 tablet 3  . meclizine (ANTIVERT) 25 MG tablet Take 1 tablet (25 mg total) by mouth 3 (three) times daily. 90 tablet 0  . metoprolol succinate (TOPROL-XL) 25 MG 24 hr tablet Take 1 tablet (25 mg total) by mouth daily. 30 tablet 1  . ondansetron (ZOFRAN ODT) 4 MG disintegrating tablet Take 1 tablet (4 mg total) by mouth every 8 (eight) hours as needed for nausea or vomiting. (Patient taking differently: Take 4 mg by mouth every 8 (eight) hours as needed for nausea or vomiting. DISSOLVE IN THE MOUTH) 20 tablet 0  . ONETOUCH VERIO test strip USE AS DIRECTED EVERY DAY  3  .  polyethylene glycol (MIRALAX / GLYCOLAX) packet Take 17 g daily as needed by mouth for moderate constipation. 14 each 0  . potassium chloride (KLOR-CON M10) 10 MEQ tablet Take 1 tablet (10 mEq total) by mouth daily. 30 tablet 0  . rosuvastatin (CRESTOR) 10 MG tablet Take 1 tablet (10 mg total) by mouth daily. 30 tablet 0  . traZODone (DESYREL) 50 MG tablet Take 1 tablet (50 mg total) by mouth at bedtime. 30 tablet 0  . umeclidinium bromide (INCRUSE ELLIPTA) 62.5 MCG/INH AEPB Inhale 1 puff into the lungs daily. 1 each 1   No current facility-administered medications for this visit.    Facility-Administered Medications Ordered in Other Visits  Medication Dose Route Frequency Provider Last Rate Last Dose  . fentaNYL (SUBLIMAZE) injection    Anesthesia Intra-op Candis Shine, CRNA   50 mcg at 09/09/17 0718  . lactated ringers infusion    Continuous PRN Harder, Rebeca Alert, CRNA      . lactated ringers infusion    Continuous PRN Harder, Rebeca Alert, CRNA      . midazolam (VERSED) 5 MG/5ML injection    Anesthesia Intra-op Candis Shine, CRNA   1 mg at 09/09/17 1062    SURGICAL HISTORY:  Past Surgical History:  Procedure Laterality Date  . ABDOMINAL HYSTERECTOMY  1997?   "partial"  . ANTERIOR CERVICAL DECOMP/DISCECTOMY FUSION N/A 12/25/2016   Procedure: ANTERIOR CERVICAL DECOMPRESSION/DISCECTOMY FUSION CERVICAL FOUR CERVICAL FIVE;  Surgeon: Ashok Pall, MD;  Location: [facility];  Service: Neurosurgery;  Laterality: N/A;  . CHOLECYSTECTOMY  ~ 2008  . COLONOSCOPY    . EYE SURGERY     cateracts  . FRACTURE SURGERY  2004?   right shoulder  . KNEE ARTHROSCOPY  2004   right  . KNEE SURGERY     right  . neck fusion    . SHOULDER OPEN ROTATOR CUFF REPAIR  07/20/2011   left  . TONSILLECTOMY      REVIEW OF SYSTEMS:   Review of Systems  Constitutional: Negative for appetite change, chills, fever and unexpected weight change.  HENT:   Negative for mouth sores, nosebleeds, sore throat and trouble  swallowing.   Eyes: Negative for eye problems and icterus.  Respiratory: Negative for cough, hemoptysis, shortness of breath and wheezing.   Cardiovascular: Negative for chest pain and leg swelling.  Gastrointestinal: Negative for abdominal pain, constipation, diarrhea, nausea and vomiting.  Genitourinary: Negative for bladder incontinence, difficulty urinating, dysuria, frequency and hematuria.   Musculoskeletal: Negative for back pain, gait problem, neck pain and neck stiffness.  Skin: Negative for itching and rash.  Neurological: Negative for dizziness, extremity weakness, gait problem, headaches, light-headedness and seizures.  Hematological: Negative for adenopathy. Does not  bruise/bleed easily.  Psychiatric/Behavioral: Negative for confusion, depression and sleep disturbance. The patient is not nervous/anxious.     PHYSICAL EXAMINATION:  Blood pressure (!) 143/67, pulse 99, temperature 98.6 F (37 C), temperature source Oral, resp. rate 18, height 5\' 7"  (1.702 m), weight 223 lb (101.2 kg), SpO2 99 %.  ECOG PERFORMANCE STATUS: 1 - Symptomatic but completely ambulatory  Physical Exam  Constitutional: Oriented to person, place, and time and well-developed, well-nourished, and in no distress. No distress.  HENT:  Head: Normocephalic and atraumatic.  Mouth/Throat: Oropharynx is clear and moist. No oropharyngeal exudate.  Eyes: Conjunctivae are normal. Right eye exhibits no discharge. Left eye exhibits no discharge. No scleral icterus.  Neck: Normal range of motion. Neck supple.  Cardiovascular: Normal rate, regular rhythm, normal heart sounds and intact distal pulses.   Pulmonary/Chest: Effort normal and breath sounds normal. No respiratory distress. No wheezes. No rales.  Abdominal: Soft. Bowel sounds are normal. Exhibits no distension and no mass. There is no tenderness.  Musculoskeletal: Normal range of motion. Exhibits no edema.  Lymphadenopathy:    No cervical adenopathy.   Neurological: Alert and oriented to person, place, and time. Exhibits normal muscle tone. Gait normal. Coordination normal.  Skin: Skin is warm and dry. No rash noted. Not diaphoretic. No erythema. No pallor.  Psychiatric: Mood, memory and judgment normal.  Vitals reviewed.  LABORATORY DATA: Lab Results  Component Value Date   WBC 8.5 10/28/2017   HGB 13.0 10/28/2017   HCT 39.6 10/28/2017   MCV 89.0 10/28/2017   PLT 346 10/28/2017      Chemistry      Component Value Date/Time   NA 139 10/28/2017 1242   K 3.8 10/28/2017 1242   CL 103 10/21/2017 0803   CO2 24 10/28/2017 1242   BUN 15.4 10/28/2017 1242   CREATININE 1.1 10/28/2017 1242      Component Value Date/Time   CALCIUM 10.4 10/28/2017 1242   ALKPHOS 145 10/28/2017 1242   AST 11 10/28/2017 1242   ALT 8 10/28/2017 1242   BILITOT 0.43 10/28/2017 1242       RADIOGRAPHIC STUDIES:  Dg Chest 2 View  Result Date: 10/13/2017 CLINICAL DATA:  Cough, shortness of breath, weakness EXAM: CHEST  2 VIEW COMPARISON:  09/24/2017 FINDINGS: Normal heart size, mediastinal contours, and pulmonary vascularity. Atherosclerotic calcification aorta. Persistent visualization of a RIGHT apex mass 3.1 x 3.0 cm. Lungs otherwise clear. No pulmonary infiltrate, pleural effusion or pneumothorax. Scattered endplate spur formation thoracic spine. IMPRESSION: Persistent visualization of a RIGHT apex mass. No acute abnormalities. Electronically Signed   By: Lavonia Dana M.D.   On: 10/13/2017 09:39   Dg Shoulder 1v Right  Result Date: 10/07/2017 CLINICAL DATA:  RIGHT shoulder pain. EXAM: RIGHT SHOULDER - 1 VIEW COMPARISON:  Chest radiograph September 24, 2017 FINDINGS: No acute fracture deformity or dislocation. Tiny calcification projects at greater tuberosity. No advanced arthropathy . No destructive bony lesions. RIGHT apical lung mass, recently biopsied. Radiopaque material overlies RIGHT shoulder, recommend direct inspection. IMPRESSION: No acute  fracture deformity or dislocation. Tiny calcification at greater tuberosity associated with calcific tendinopathy. Electronically Signed   By: Elon Alas M.D.   On: 10/07/2017 22:52   Ct Head Wo Contrast  Result Date: 10/04/2017 CLINICAL DATA:  Intracranial hemorrhage follow up EXAM: CT HEAD WITHOUT CONTRAST TECHNIQUE: Contiguous axial images were obtained from the base of the skull through the vertex without intravenous contrast. COMPARISON:  Head CT 10/02/2017 FINDINGS: Brain: Hemorrhagic lesion of the left  in pole superior frontal lobe is unchanged in size, measuring 11 mm. The degree of surrounding vasogenic edema is unchanged. The remainder the brain is normal. In Vascular: No hyperdense vessel or unexpected calcification. Skull: Normal visualized skull base, calvarium and extracranial soft tissues. Sinuses/Orbits: No sinus fluid levels or advanced mucosal thickening. No mastoid effusion. Normal orbits. IMPRESSION: Unchanged superior left frontal lobe hemorrhagic metastasis with surrounding vasogenic edema. Electronically Signed   By: Ulyses Jarred M.D.   On: 10/04/2017 21:05   Ct Head Code Stroke Wo Contrast  Result Date: 10/02/2017 CLINICAL DATA:  Code stroke. Focal neuro deficit less than 6 hours. Right-sided weakness with twitching. Lung mass with brain lesions consistent with metastatic disease. EXAM: CT HEAD WITHOUT CONTRAST TECHNIQUE: Contiguous axial images were obtained from the base of the skull through the vertex without intravenous contrast. COMPARISON:  MRI 09/23/2017.  CT 09/09/2017 FINDINGS: Brain: Vasogenic edema in the left frontal parietal lobe as noted on prior MRI. 11.5 mm hyperdense nodule within the edema compatible with hemorrhagic metastatic disease. This lesion shows enhancement on the prior study. No other areas of hemorrhage or edema. Ventricle size normal. No acute ischemic infarction. Vascular: Negative for hyperdense vessel. Skull: Negative Sinuses/Orbits: Negative  Other: None ASPECTS (Long Neck Stroke Program Early CT Score) - Ganglionic level infarction (caudate, lentiform nuclei, internal capsule, insula, M1-M3 cortex): 7 - Supraganglionic infarction (M4-M6 cortex): 3 Total score (0-10 with 10 being normal): 10 IMPRESSION: 1. 11 mm hemorrhagic metastatic deposit in the left frontal parietal lobe with surrounding edema. The hemorrhage is hyperdense and appears new compared with prior studies 2. ASPECTS is 10 These results were called by telephone at the time of interpretation on 10/02/2017 at 6:59 am to Dr. Cheral Marker, who verbally acknowledged these results. Electronically Signed   By: Franchot Gallo M.D.   On: 10/02/2017 06:59     ASSESSMENT/PLAN:  Metastatic lung cancer (metastasis from lung to other site) [facility]) This is a very pleasant 71 year old African-American female with highly suspicious stage IV (T1b, N0, M1C) poorly differentiated non-small cell lung cancer presented with right upper lobe lung nodule in addition to solitary brain metastasis.  Patient is status post SRS to the brain lesion.  She is now undergoing physical therapy to get stronger.  The patient was seen with Dr. Julien Nordmann.  The results were discussed with the patient which confirmed that she does have non-small cell lung cancer.  We will send tissue for PDL 1 testing.  We recommended that the patient follow-up with Dr. Roxan Hockey for discussion of surgical resection.  She is scheduled back with him next month for further discussion of this.  We discussed that this would be her best option for treatment.  If she is not a surgical candidate, will refer the patient back to radiation oncology for consideration of stereotactic radiotherapy to the lung mass.  We will plan to give systemic therapy following either surgery or radiation.  The patient will return for follow-up in approximately 6 weeks after she been reevaluated by cardiothoracic surgery for further discussion of her treatment  options.  She was advised to call immediately if she has any concerning symptoms in the interval. The patient voices understanding of current disease status and treatment options and is in agreement with the current care plan.  All questions were answered. The patient knows to call the clinic with any problems, questions or concerns. We can certainly see the patient much sooner if necessary.  Orders Placed This Encounter  Procedures  .  CBC with Differential/Platelet    Standing Status:   Future    Standing Expiration Date:   10/28/2018  . Comprehensive metabolic panel    Standing Status:   Future    Standing Expiration Date:   10/28/2018     Kirsten Bussing, DNP, AGPCNP-BC, AOCNP 10/28/17  ADDENDUM: Hematology/Oncology Attending: I had a face-to-face encounter with the patient.  I recommended her care plan.  This is a very pleasant 71 years old African-American female with stage IV non-small cell lung cancer presented with right upper lobe lung nodule in addition to solitary brain metastasis status post surgical resection followed by [facility] radiotherapy.  The patient is currently recovering from recent weakness and fatigue secondary to her brain metastasis. She was supposed to see Dr. Roxan Hockey for evaluation and discussion of surgical resection of the solitary tumor in the right lung. I had a lengthy discussion with the patient and her daughter today about her condition.  I explained to the patient that if she is not a good surgical candidate for resection, she would benefit from stereotactic body radiotherapy to the right upper lobe lung mass followed by systemic chemotherapy. The patient is a scheduled to see Dr. Roxan Hockey in few weeks for evaluation and more detailed discussion of her surgical option. I will arrange for the patient to come back for follow-up visit in 6 weeks for more detailed discussion of her systemic treatment options. The patient was advised to call immediately if  she has any concerning symptoms in the interval.  Disclaimer: This note was dictated with voice recognition software. Similar sounding words can inadvertently be transcribed and Karron be missed upon review. Eilleen Kempf, MD 10/30/17

## 2017-10-28 NOTE — Telephone Encounter (Signed)
Gave avs and calendar for January 2019 °

## 2017-10-28 NOTE — Assessment & Plan Note (Signed)
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  Patient is status post SRS to the brain lesion.  She is now undergoing physical therapy to get stronger.  The patient was seen with Dr. Julien Nordmann.  The results were discussed with the patient which confirmed that she does have non-small cell lung cancer.  We will send tissue for PDL 1 testing.  We recommended that the patient follow-up with Dr. Roxan Hockey for discussion of surgical resection.  She is scheduled back with him next month for further discussion of this.  We discussed that this would be her best option for treatment.  If she is not a surgical candidate, will refer the patient back to radiation oncology for consideration of stereotactic radiotherapy to the lung mass.  We will plan to give systemic therapy following either surgery or radiation.  The patient will return for follow-up in approximately 6 weeks after she been reevaluated by cardiothoracic surgery for further discussion of her treatment options.  She was advised to call immediately if she has any concerning symptoms in the interval. The patient voices understanding of current disease status and treatment options and is in agreement with the current care plan.  All questions were answered. The patient knows to call the clinic with any problems, questions or concerns. We can certainly see the patient much sooner if necessary.

## 2017-10-28 NOTE — Progress Notes (Signed)
Oncology Nurse Navigator Documentation  Oncology Nurse Navigator Flowsheets 10/28/2017  Navigator Location [facility]  Navigator Encounter Type Other/per Dr. Julien Nordmann, I notified pathology to send PDL 1 testing on recent biopsy.   Patient Visit Type MedOnc  Treatment Phase Follow-up  Barriers/Navigation Needs Coordination of Care  Interventions Coordination of Care  Acuity Level 1  Time Spent with Patient 15

## 2017-11-01 ENCOUNTER — Emergency Department (HOSPITAL_COMMUNITY)
Admission: EM | Admit: 2017-11-01 | Discharge: 2017-11-01 | Disposition: A | Discharge status: Home / Self Care | Payer: Medicare HMO | Attending: Emergency Medicine | Admitting: Emergency Medicine

## 2017-11-01 ENCOUNTER — Other Ambulatory Visit: Payer: Self-pay

## 2017-11-01 ENCOUNTER — Emergency Department (HOSPITAL_COMMUNITY): Payer: Medicare HMO

## 2017-11-01 ENCOUNTER — Ambulatory Visit
Admission: RE | Admit: 2017-11-01 | Discharge: 2017-11-01 | Disposition: A | Discharge status: Home / Self Care | Payer: Medicare HMO | Source: Ambulatory Visit | Attending: Radiation Oncology | Admitting: Radiation Oncology

## 2017-11-01 ENCOUNTER — Ambulatory Visit: Payer: Medicare HMO | Admitting: Internal Medicine

## 2017-11-01 ENCOUNTER — Encounter (HOSPITAL_COMMUNITY): Payer: Self-pay | Admitting: Emergency Medicine

## 2017-11-01 DIAGNOSIS — Z7984 Long term (current) use of oral hypoglycemic drugs: Secondary | ICD-10-CM | POA: Diagnosis not present

## 2017-11-01 DIAGNOSIS — R0789 Other chest pain: Secondary | ICD-10-CM | POA: Diagnosis present

## 2017-11-01 DIAGNOSIS — E1122 Type 2 diabetes mellitus with diabetic chronic kidney disease: Secondary | ICD-10-CM | POA: Insufficient documentation

## 2017-11-01 DIAGNOSIS — J449 Chronic obstructive pulmonary disease, unspecified: Secondary | ICD-10-CM | POA: Insufficient documentation

## 2017-11-01 DIAGNOSIS — Z79899 Other long term (current) drug therapy: Secondary | ICD-10-CM | POA: Diagnosis not present

## 2017-11-01 DIAGNOSIS — R110 Nausea: Secondary | ICD-10-CM | POA: Diagnosis not present

## 2017-11-01 DIAGNOSIS — I129 Hypertensive chronic kidney disease with stage 1 through stage 4 chronic kidney disease, or unspecified chronic kidney disease: Secondary | ICD-10-CM | POA: Insufficient documentation

## 2017-11-01 DIAGNOSIS — Z85118 Personal history of other malignant neoplasm of bronchus and lung: Secondary | ICD-10-CM | POA: Insufficient documentation

## 2017-11-01 DIAGNOSIS — N183 Chronic kidney disease, stage 3 (moderate): Secondary | ICD-10-CM | POA: Insufficient documentation

## 2017-11-01 DIAGNOSIS — K921 Melena: Secondary | ICD-10-CM | POA: Diagnosis not present

## 2017-11-01 DIAGNOSIS — R109 Unspecified abdominal pain: Secondary | ICD-10-CM | POA: Diagnosis not present

## 2017-11-01 DIAGNOSIS — Z85841 Personal history of malignant neoplasm of brain: Secondary | ICD-10-CM | POA: Insufficient documentation

## 2017-11-01 DIAGNOSIS — Z87891 Personal history of nicotine dependence: Secondary | ICD-10-CM | POA: Diagnosis not present

## 2017-11-01 DIAGNOSIS — R079 Chest pain, unspecified: Secondary | ICD-10-CM

## 2017-11-01 LAB — CBC
HCT: 41.8 % (ref 36.0–46.0)
Hemoglobin: 13.8 g/dL (ref 12.0–15.0)
MCH: 29.3 pg (ref 26.0–34.0)
MCHC: 33 g/dL (ref 30.0–36.0)
MCV: 88.7 fL (ref 78.0–100.0)
Platelets: 266 10*3/uL (ref 150–400)
RBC: 4.71 MIL/uL (ref 3.87–5.11)
RDW: 13.7 % (ref 11.5–15.5)
WBC: 10.9 10*3/uL — ABNORMAL HIGH (ref 4.0–10.5)

## 2017-11-01 LAB — COMPREHENSIVE METABOLIC PANEL
ALT: 11 U/L — ABNORMAL LOW (ref 14–54)
AST: 19 U/L (ref 15–41)
Albumin: 3 g/dL — ABNORMAL LOW (ref 3.5–5.0)
Alkaline Phosphatase: 120 U/L (ref 38–126)
Anion gap: 9 (ref 5–15)
BUN: 11 mg/dL (ref 6–20)
CO2: 19 mmol/L — ABNORMAL LOW (ref 22–32)
Calcium: 10 mg/dL (ref 8.9–10.3)
Chloride: 105 mmol/L (ref 101–111)
Creatinine, Ser: 1.05 mg/dL — ABNORMAL HIGH (ref 0.44–1.00)
GFR calc Af Amer: >60 mL/min (ref >60)
GFR calc non Af Amer: 53 mL/min — ABNORMAL LOW (ref >60)
Glucose, Bld: 161 mg/dL — ABNORMAL HIGH (ref 65–99)
Potassium: 3.8 mmol/L (ref 3.5–5.1)
Sodium: 133 mmol/L — ABNORMAL LOW (ref 135–145)
Total Bilirubin: 0.5 mg/dL (ref 0.3–1.2)
Total Protein: 7.1 g/dL (ref 6.5–8.1)

## 2017-11-01 LAB — I-STAT TROPONIN, ED
Troponin i, poc: 0.01 ng/mL (ref 0.00–0.08)
Troponin i, poc: 0 ng/mL (ref 0.00–0.08)

## 2017-11-01 LAB — LIPASE, BLOOD: Lipase: 24 U/L (ref 11–51)

## 2017-11-01 MED ORDER — HYDROMORPHONE HCL 1 MG/ML IJ SOLN
1.0000 mg | Freq: Once | INTRAMUSCULAR | Status: AC
  Administered 2017-11-01: 1 mg via INTRAVENOUS
  Filled 2017-11-01: qty 1

## 2017-11-01 MED ORDER — SODIUM CHLORIDE 0.9 % IV BOLUS (SEPSIS)
1000.0000 mL | Freq: Once | INTRAVENOUS | Status: AC
  Administered 2017-11-01: 1000 mL via INTRAVENOUS

## 2017-11-01 MED ORDER — IOPAMIDOL (ISOVUE-370) INJECTION 76%
INTRAVENOUS | Status: AC
  Administered 2017-11-01: 100 mL
  Filled 2017-11-01: qty 100

## 2017-11-01 MED ORDER — TRAMADOL HCL 50 MG PO TABS: 50.0000 mg | ORAL_TABLET | Freq: Two times a day (BID) | ORAL | 0 refills | Status: DC | PRN

## 2017-11-01 NOTE — ED Triage Notes (Signed)
Pt states for the last 3 days she has right sided chest pain that radiates into right side and feels sharp in nature. Pt has had 4 episodes of diarrhea in the last 24 hours.

## 2017-11-01 NOTE — Discharge Instructions (Addendum)
Your CT scan today showed what is called a pleural effusion on the right side of your chest.  This is a collection of fluid and is likely the cause of your pain.  Take tramadol as needed for severe pain, you Keenya take ibuprofen or Tylenol with this medication.  I recommend alternating ibuprofen and Tylenol every 3 hours follow-up with your cardiothoracic surgeon as needed for pain.  Do not exceed 4000 mg of Tylenol daily. Follow-up with your cardiothoracic surgeon tomorrow as scheduled for reevaluation of your symptoms.  Return to the ED immediately for any concerning signs or symptoms develop such as fevers, worsening shortness of breath or chest pain, or difficulty breathing.

## 2017-11-01 NOTE — ED Notes (Signed)
Patient transported to X-ray 

## 2017-11-01 NOTE — ED Provider Notes (Signed)
[facility]ERGENC[facility] DEPARTMENT Provider Note   CSN: [id number] Arrival date & time: 11/01/17  1011     History   Chief Complaint Chief Complaint  Patient presents with  . Chest Pain    HPI [name] is a 71 y.o. female with history of COPD, non-small cell lung cancer with metastases, DM, GERD presents today with chief complaint acute onset, intermittent chest pain and right sided abdominal pain for 3 days.  Notes sharp stabbing pain to her right flank as well as sharp stabbing substernal primarily right-sided chest pain which does not radiate.  Pain worsens with movement, cough, and deep inspiration.  She notes no worsening of her chronic cough or chronic shortness of breath..  Denies fevers or chills.  Denies hemoptysis.  She notes nausea but no vomiting.  Has had an average of 3 episodes of loose stools daily for the past 3 days with no normal stools in between.  Notes that there was bright red blood in the commode with the first episode of diarrhea but not since.  She notes that she has bright red blood per rectum occasionally and this is not new for her.  She has tried Tylenol with some relief.  Denies leg swelling, recent travel or surgeries, or prior history of DVT or PE.  She is not on anticoagulation.  Was recently admitted for management of a hemorrhagic frontal lobe metastasis.  Notes her headache and weakness have resolved at this time.  She followed up with her oncologist and CT surgery earlier this month and found not to be a good surgical candidate at that time.  Not on chemotherapy at this time. States she quit smoking in August.   The history is provided by the patient.    Past Medical History:  Diagnosis Date  . Anxiety   . Arthritis   . Cancer ([place])   . Chronic back pain greater than 3 months duration   . Chronic neck pain   . COPD (chronic obstructive pulmonary disease) ([place])   . Cough   . Diabetes mellitus   . Dyspnea   . GERD  (gastroesophageal reflux disease)   . H/O hiatal hernia   . Headache(784.0)   . Hemorrhoids   . Herniated disc   . History of kidney stones   . Hx of echocardiogram 2010   normal EF  . Hypertension   . Palpitations   . Shortness of breath on exertion 11/10/11   "sometimes"  . Vertigo   . Vertigo     Patient Active Problem List   Diagnosis Date Noted  . Cerebral edema (HCC)   . Muscle spasm   . Diabetes mellitus type 2 in obese ([place])   . Labile blood glucose   . Cough   . Hypoalbuminemia due to protein-calorie malnutrition ([place])   . Leukocytosis   . Metastatic lung cancer (metastasis from lung to other site) ([place]) 10/11/2017  . Brain metastasis ([place])   . Seizure prophylaxis   . Hyperlipidemia   . Slow transit constipation   . Hyponatremia   . Stage 3 chronic kidney disease ([place])   . Type 2 diabetes mellitus with peripheral neuropathy (HCC)   . Benign essential HTN   . Tobacco abuse   . 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 GERD (gastroesophageal reflux disease) 08/16/2017  . Lung mass 08/16/2017  . Shortness of breath 07/05/2017    Class: Acute  . Vaginal odor 02/10/2017  . Dysuria 02/10/2017  .  Osteoarthritis of spine with radiculopathy, cervical region 12/25/2016  . COPD (chronic obstructive pulmonary disease) ([place]) 10/11/2015  . Orthostatic hypotension 06/18/2013  . Numbness and tingling in right hand 11/10/2011    Class: Acute  . Hypertension 11/10/2011  . Cervical disc disease 11/10/2011    Class: Chronic  . DM II (diabetes mellitus, type II), controlled ([place]) 11/10/2011    Class: Chronic    Past Surgical History:  Procedure Laterality Date  . ABDOMINAL HYSTERECTOMY  1997?   "partial"  . ANTERIOR CERVICAL DECOMP/DISCECTOMY FUSION N/A 12/25/2016   Procedure: ANTERIOR CERVICAL DECOMPRESSION/DISCECTOMY FUSION CERVICAL FOUR CERVICAL FIVE;  Surgeon: Ashok Pall, MD;  Location: [facility];  Service: Neurosurgery;  Laterality: N/A;  . CHOLECYSTECTOMY  ~  2008  . COLONOSCOPY    . EYE SURGERY     cateracts  . FRACTURE SURGERY  2004?   right shoulder  . KNEE ARTHROSCOPY  2004   right  . KNEE SURGERY     right  . neck fusion    . SHOULDER OPEN ROTATOR CUFF REPAIR  07/20/2011   left  . TONSILLECTOMY      OB History    No data available       Home Medications    Prior to Admission medications   Medication Sig Start Date End Date Taking? Authorizing Provider  acetaminophen (TYLENOL) 500 MG tablet Take 1 tablet (500 mg total) every 6 (six) hours as needed by mouth for fever. 10/11/17   Dixie Dials, MD  albuterol (PROVENTIL) (2.5 MG/3ML) 0.083% nebulizer solution USE 1 VIAL EVERY 6 HOURS AS NEEDED FOR SHORTNESS OF BREATH 10/15/15   [provider]  albuterol (VENTOLIN HFA) 108 (90 Base) MCG/ACT inhaler Inhale 2 puffs into the lungs every 6 (six) hours as needed for wheezing or shortness of breath.    [provider]  ALPRAZolam Duanne Moron) 0.25 MG tablet Take 1 tablet (0.25 mg total) by mouth 3 (three) times daily as needed for anxiety. 10/22/17   Angiulli, Lavon Paganini, PA-C  amLODipine (NORVASC) 5 MG tablet Take 5 mg by mouth 2 (two) times daily. 10/01/17   [provider]  cholecalciferol (VITAMIN D) 1000 units tablet Take 1,000 Units by mouth daily.    [provider]  citalopram (CELEXA) 10 MG tablet Take 0.5 tablets (5 mg total) by mouth daily. 10/22/17   Angiulli, Lavon Paganini, PA-C  esomeprazole (NEXIUM) 40 MG capsule Take 1 capsule (40 mg total) by mouth daily as needed (acid reflux). 10/22/17   Angiulli, Lavon Paganini, PA-C  gabapentin (NEURONTIN) 100 MG capsule Take 1 capsule (100 mg total) by mouth daily as needed (for leg pain). 10/22/17   Angiulli, Lavon Paganini, PA-C  HYDROcodone-acetaminophen (NORCO/VICODIN) 5-325 MG tablet Take 1 tablet by mouth every 4 (four) hours as needed for moderate pain or severe pain (As needed for pain.). 10/22/17   Angiulli, Lavon Paganini, PA-C  JANUMET 50-500 MG tablet Take 1 tablet by  mouth 2 (two) times daily. 09/16/17   [provider]  levETIRAcetam (KEPPRA) 500 MG tablet Take 1 tablet (500 mg total) by mouth 2 (two) times daily. 10/22/17   Angiulli, Lavon Paganini, PA-C  loratadine (CLARITIN) 10 MG tablet Take 10 mg by mouth daily as needed for allergies.    [provider]  losartan (COZAAR) 25 MG tablet Take 1 tablet (25 mg total) by mouth daily. 10/22/17   Angiulli, Lavon Paganini, PA-C  meclizine (ANTIVERT) 25 MG tablet Take 1 tablet (25 mg total) by mouth 3 (three)  times daily. 10/22/17   Angiulli, Lavon Paganini, PA-C  metoprolol succinate (TOPROL-XL) 25 MG 24 hr tablet Take 1 tablet (25 mg total) by mouth daily. 10/22/17   Angiulli, Lavon Paganini, PA-C  ondansetron (ZOFRAN ODT) 4 MG disintegrating tablet Take 1 tablet (4 mg total) by mouth every 8 (eight) hours as needed for nausea or vomiting. Patient taking differently: Take 4 mg by mouth every 8 (eight) hours as needed for nausea or vomiting. DISSOLVE IN THE MOUTH 02/27/17   Duffy Bruce, MD  [facility] VERIO test strip USE AS DIRECTED EVERY DAY 09/13/17   [provider]  polyethylene glycol (MIRALAX / GLYCOLAX) packet Take 17 g daily as needed by mouth for moderate constipation. 10/11/17   Dixie Dials, MD  potassium chloride (KLOR-CON M10) 10 MEQ tablet Take 1 tablet (10 mEq total) by mouth daily. 10/22/17   Angiulli, Lavon Paganini, PA-C  rosuvastatin (CRESTOR) 10 MG tablet Take 1 tablet (10 mg total) by mouth daily. 10/22/17   Angiulli, Lavon Paganini, PA-C  traMADol (ULTRAM) 50 MG tablet Take 1 tablet (50 mg total) by mouth every 12 (twelve) hours as needed. 11/01/17   Jamilynn Whitacre A, PA-C  traZODone (DESYREL) 50 MG tablet Take 1 tablet (50 mg total) by mouth at bedtime. 10/22/17   Angiulli, Lavon Paganini, PA-C  umeclidinium bromide (INCRUSE ELLIPTA) 62.5 MCG/INH AEPB Inhale 1 puff into the lungs daily. 10/22/17   AngiulliLavon Paganini, PA-C    Family History Family History  Problem Relation Age of Onset  . Lung cancer  Sister     Social History Social History   Tobacco Use  . Smoking status: Former Smoker    Packs/day: 0.25    Years: 30.00    Pack years: 7.50    Types: Cigarettes    Last attempt to quit: 07/08/2017    Years since quitting: 0.3  . Smokeless tobacco: Never Used  . Tobacco comment: 4-5 cigs / day  Substance Use Topics  . Alcohol use: No  . Drug use: No     Allergies   Oxycodone   Review of Systems Review of Systems  Constitutional: Negative for chills and fever.  Eyes: Negative for visual disturbance.  Respiratory: Positive for cough and shortness of breath.   Cardiovascular: Positive for chest pain.  Gastrointestinal: Positive for abdominal pain, blood in stool, diarrhea and nausea. Negative for constipation and vomiting.  Genitourinary: Positive for flank pain. Negative for frequency and urgency.  Neurological: Negative for syncope, weakness and headaches.  All other systems reviewed and are negative.    Physical Exam Updated Vital Signs BP 121/74   Pulse 88   Temp 98.8 F (37.1 C) (Oral)   Resp 16   Ht 5\' 7"  (1.702 m)   Wt 101.2 kg (223 lb)   SpO2 96%   BMI 34.93 kg/m   Physical Exam  Constitutional: She appears well-developed and well-nourished. No distress.  HENT:  Head: Normocephalic and atraumatic.  Eyes: Conjunctivae and EOM are normal. Pupils are equal, round, and reactive to light. Right eye exhibits no discharge. Left eye exhibits no discharge.  Neck: Normal range of motion. Neck supple. No JVD present. No tracheal deviation present.  Cardiovascular: Normal rate and regular rhythm.  Pulses:      Radial pulses are 2+ on the right side, and 2+ on the left side.       Dorsalis pedis pulses are 2+ on the right side, and 2+ on the left side.  Posterior tibial pulses are 2+ on the right side, and 2+ on the left side.  Pulmonary/Chest: Effort normal. She has decreased breath sounds.  Equal rise and fall of chest, no increased work of breathing.   Speaking in full sentences without difficulty.  Coarse breath sounds in the bilateral bases posteriorly. Chest wall non-tender to palpation with no deformity, crepitus, or paradoxical wall motion.   Abdominal: Soft. Bowel sounds are normal. She exhibits no distension. There is tenderness. There is no rebound and no guarding.  Right upper and lower quadrant tenderness to palpation.  Murphy sign absent, Rovsing's absent, no TTP at McBurney's point.  No CVA tenderness.  Musculoskeletal: She exhibits no edema.       Right lower leg: Normal. She exhibits no tenderness and no edema.       Left lower leg: Normal. She exhibits no tenderness and no edema.  No midline spine TTP, no paraspinal muscle tenderness, no deformity, crepitus, or step-off noted   Neurological: She is alert.  Skin: Skin is warm and dry. Capillary refill takes less than 2 seconds. No erythema.  Psychiatric: She has a normal mood and affect. Her behavior is normal.  Nursing note and vitals reviewed.    ED Treatments / Results  Labs (all labs ordered are listed, but only abnormal results are displayed) Labs Reviewed  CBC - Abnormal; Notable for the following components:      Result Value   WBC 10.9 (*)    All other components within normal limits  COMPREHENSIVE METABOLIC PANEL - Abnormal; Notable for the following components:   Sodium 133 (*)    CO2 19 (*)    Glucose, Bld 161 (*)    Creatinine, Ser 1.05 (*)    Albumin 3.0 (*)    ALT 11 (*)    GFR calc non Af Amer 53 (*)    All other components within normal limits  LIPASE, BLOOD  URINALYSIS, ROUTINE W REFLEX MICROSCOPIC  I-STAT TROPONIN, ED  I-STAT TROPONIN, ED    EKG  EKG Interpretation  Date/Time:  Monday November 01 2017 10:16:58 EST Ventricular Rate:  96 PR Interval:  172 QRS Duration: 82 QT Interval:  346 QTC Calculation: 437 R Axis:   -17 Text Interpretation:  Normal sinus rhythm Septal infarct , age undetermined Abnormal ECG nonspecific inferior ST T  changes Confirmed by Davonna Belling [phone]) on 11/01/2017 10:21:44 AM       Radiology Dg Chest 2 View  Result Date: 11/01/2017 CLINICAL DATA:  Right upper chest pain. 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 EXAM: CHEST  2 VIEW COMPARISON:  October 13, 2017 FINDINGS: The heart size and mediastinal contours are stable. Right upper lobe mass/ carcinoma is identified unchanged. Mild atelectasis of both lung bases are noted. There is a small right pleural effusion. The visualized skeletal structures are stable. IMPRESSION: Right upper lobe mass/ carcinoma is identified unchanged. Mild atelectasis of bilateral lung bases. Small right pleural effusion. Electronically Signed   By: Abelardo Diesel M.D.   On: 11/01/2017 11:12   Ct Angio Chest Pe W And/or Wo Contrast  Result Date: 11/01/2017 CLINICAL DATA:  Right-sided chest pain and abdominal pain, initial encounter EXAM: CT ANGIOGRAPHY CHEST CT ABDOMEN AND PELVIS WITH CONTRAST TECHNIQUE: Multidetector CT imaging of the chest was performed using the standard protocol during bolus administration of intravenous contrast. Multiplanar CT image reconstructions and MIPs were obtained to evaluate the vascular anatomy. Multidetector CT imaging of the abdomen and pelvis  was performed using the standard protocol during bolus administration of intravenous contrast. CONTRAST:  157mL ISOVUE-370 IOPAMIDOL (ISOVUE-370) INJECTION 76% COMPARISON:  PET-CT from 07/28/2017, chest CT from 07/05/2017, CT of the abdomen from 01/28/2017 FINDINGS: CTA CHEST FINDINGS Cardiovascular: Thoracic aorta and its branches demonstrate atherosclerotic calcifications. No aneurysmal dilatation or dissection is seen. Coronary calcifications are noted. No significant cardiac enlargement is seen. The pulmonary artery shows a normal branching pattern. No filling defects to suggest pulmonary emboli are identified. Mediastinum/Nodes: Thoracic inlet is within normal  limits. There is a 13 mm short axis lymph node identified in the right peritracheal region adjacent to the azygos vein which has increased in size when compared with the prior exam. No definitive hilar or other mediastinal adenopathy is seen. The esophagus is within normal limits. Lungs/Pleura: Emphysematous changes are again noted. The left lung is clear. No significant infiltrate or effusion is noted. The known right upper lobe cavitary mass lesion is again seen and has shown interval increase in size now measuring 3.8 x 3.8 cm. It previously measured 2.6 cm in greatest dimension. A a moderate to large right-sided pleural effusion is noted with some associated atelectatic changes. Additionally there is a considerable amount of thickening in the pleura laterally adjacent to the mid upper lobe mass lesion and extending inferiorly. This is consistent with interval local invasion into the chest wall and likely contributing to the patient's chest pain. Musculoskeletal: Bony structures show degenerative change of the thoracic spine. No definitive bony invasion is noted on this exam. No compression deformities are seen. Review of the MIP images confirms the above findings. CT ABDOMEN and PELVIS FINDINGS Hepatobiliary: Gallbladder has been surgically removed. The liver shows no focal mass lesion. Pancreas: Unremarkable. No pancreatic ductal dilatation or surrounding inflammatory changes. Spleen: Normal in size without focal abnormality. Adrenals/Urinary Tract: The adrenal glands are within normal limits. Kidneys are well visualized bilaterally. A few renal cysts are noted particularly on the left with the largest measuring 2.7 cm in dimension. Scattered renal vascular calcifications are noted. No renal calculi are seen. No obstructive changes are noted. The bladder is decompressed. Stomach/Bowel: The appendix is within normal limits. No obstructive or inflammatory changes of the bowel are seen. Stomach is within normal  limits. Vascular/Lymphatic: Aortic atherosclerosis. No enlarged abdominal or pelvic lymph nodes. 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 Reproductive: Status post hysterectomy. No adnexal masses. Other: No abdominal wall hernia or abnormality. No abdominopelvic ascites. Musculoskeletal: Degenerative changes of lumbar spine are seen. Review of the MIP images confirms the above findings. IMPRESSION: CTA of the chest:  No evidence of pulmonary emboli. Interval enlargement of the cavitary right upper lobe mass lesion as described. There is now a moderate to large right-sided pleural effusion as well as significant pleural thickening consistent with local invasion into the chest wall. This likely contributes to the patient's underlying discomfort. Interval enlargement of a pretracheal lymph node to 13 mm from 7 mm on 07/05/2017. Aortic Atherosclerosis (ICD10-I70.0) and Emphysema (ICD10-J43.9). CT of the abdomen and pelvis:  Postsurgical changes are noted. Stable ectasia of the abdominal aorta. Recommend followup by ultrasound in 5 years. This recommendation follows ACR consensus guidelines: White Paper of the ACR Incidental Findings Committee II on Vascular Findings. J Am Coll Radiol 2013; 10:789-794. Renal cystic change. Electronically Signed   By: Inez Catalina M.D.   On: 11/01/2017 14:47   Ct Abdomen Pelvis W Contrast  Result Date: 11/01/2017 CLINICAL DATA:  Right-sided chest pain and abdominal pain, initial encounter EXAM: CT ANGIOGRAPHY CHEST CT ABDOMEN AND PELVIS WITH CONTRAST TECHNIQUE: Multidetector CT imaging of the chest was performed using the standard protocol during bolus administration of intravenous contrast. Multiplanar CT image reconstructions and MIPs were obtained to evaluate the vascular anatomy. Multidetector CT imaging of the abdomen and pelvis was performed using the standard protocol during bolus administration of intravenous contrast.  CONTRAST:  137mL ISOVUE-370 IOPAMIDOL (ISOVUE-370) INJECTION 76% COMPARISON:  PET-CT from 07/28/2017, chest CT from 07/05/2017, CT of the abdomen from 01/28/2017 FINDINGS: CTA CHEST FINDINGS Cardiovascular: Thoracic aorta and its branches demonstrate atherosclerotic calcifications. No aneurysmal dilatation or dissection is seen. Coronary calcifications are noted. No significant cardiac enlargement is seen. The pulmonary artery shows a normal branching pattern. No filling defects to suggest pulmonary emboli are identified. Mediastinum/Nodes: Thoracic inlet is within normal limits. There is a 13 mm short axis lymph node identified in the right peritracheal region adjacent to the azygos vein which has increased in size when compared with the prior exam. No definitive hilar or other mediastinal adenopathy is seen. The esophagus is within normal limits. Lungs/Pleura: Emphysematous changes are again noted. The left lung is clear. No significant infiltrate or effusion is noted. The known right upper lobe cavitary mass lesion is again seen and has shown interval increase in size now measuring 3.8 x 3.8 cm. It previously measured 2.6 cm in greatest dimension. A a moderate to large right-sided pleural effusion is noted with some associated atelectatic changes. Additionally there is a considerable amount of thickening in the pleura laterally adjacent to the mid upper lobe mass lesion and extending inferiorly. This is consistent with interval local invasion into the chest wall and likely contributing to the patient's chest pain. Musculoskeletal: Bony structures show degenerative change of the thoracic spine. No definitive bony invasion is noted on this exam. No compression deformities are seen. Review of the MIP images confirms the above findings. CT ABDOMEN and PELVIS FINDINGS Hepatobiliary: Gallbladder has been surgically removed. The liver shows no focal mass lesion. Pancreas: Unremarkable. No pancreatic ductal dilatation or  surrounding inflammatory changes. Spleen: Normal in size without focal abnormality. Adrenals/Urinary Tract: The adrenal glands are within normal limits. Kidneys are well visualized bilaterally. A few renal cysts are noted particularly on the left with the largest measuring 2.7 cm in dimension. Scattered renal vascular calcifications are noted. No renal calculi are seen. No obstructive changes are noted. The bladder is decompressed. Stomach/Bowel: The appendix is within normal limits. No obstructive or inflammatory changes of the bowel are seen. Stomach is within normal limits. Vascular/Lymphatic: Aortic atherosclerosis. No enlarged abdominal or pelvic lymph nodes. 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 Reproductive: Status post hysterectomy. No adnexal masses. Other: No abdominal wall hernia or abnormality. No abdominopelvic ascites. Musculoskeletal: Degenerative changes of lumbar spine are seen. Review of the MIP images confirms the above findings. IMPRESSION: CTA of the chest:  No evidence of pulmonary emboli. Interval enlargement of the cavitary right upper lobe mass lesion as described. There is now a moderate to large right-sided pleural effusion as well as significant pleural thickening consistent with local invasion into the chest wall. This likely contributes to the patient's underlying discomfort. Interval enlargement of a pretracheal lymph node to 13 mm from 7 mm on 07/05/2017. Aortic Atherosclerosis (ICD10-I70.0) and Emphysema (ICD10-J43.9). CT of the abdomen and pelvis:  Postsurgical changes are noted. Stable ectasia of the abdominal aorta. Recommend followup by ultrasound  in 5 years. This recommendation follows ACR consensus guidelines: White Paper of the ACR Incidental Findings Committee II on Vascular Findings. J Am Coll Radiol 2013; 10:789-794. Renal cystic change. Electronically Signed   By: Inez Catalina M.D.   On: 11/01/2017 14:47     Procedures Procedures (including critical care time)  Medications Ordered in ED Medications  HYDROmorphone (DILAUDID) injection 1 mg (1 mg Intravenous Given 11/01/17 1126)  iopamidol (ISOVUE-370) 76 % injection (100 mLs  Contrast Given 11/01/17 1412)  sodium chloride 0.9 % bolus 1,000 mL (1,000 mLs Intravenous New Bag/Given 11/01/17 1443)  HYDROmorphone (DILAUDID) injection 1 mg (1 mg Intravenous Given 11/01/17 1523)     Initial Impression / Assessment and Plan / ED Course  I have reviewed the triage vital signs and the nursing notes.  Pertinent labs & imaging results that were available during my care of the patient were reviewed by me and considered in my medical decision making (see chart for details).     Patient with neoplasm of the lung presents with right-sided pain.  Afebrile, vital signs are stable.  She is not hypoxic.  Nontoxic in appearance.  Mild nonspecific leukocytosis of 10.9, remainder of lab work is unremarkable.  Serial troponins are negative and she has nonspecific ST T wave changes on EKG.  I doubt ACS or MI.  No evidence of PE on imaging but she does have a moderate to large right-sided pleural effusion which is likely the cause of her pain.  Pain has been managed while in the ED.  She states she has follow-up with her cardiothoracic surgeon tomorrow and I encouraged her to keep this appointment.  She is resting comfortably on reevaluation, oxygenating well.  No airway compromise noted.  Patient stable for discharge home with follow-up with her cardiothoracic surgeon and oncologist.  She was given Norco earlier this month but states she has not been taking it because it has not been helpful.  Will discharge with tramadol which she has tolerated in the past.  Discussed indications for return to the ED.  Patient and patient's husband verbalized understanding of and agreement with plan and patient stable for discharge home at this time.  Patient seen and evaluated by Dr.  Alvino Chapel who agrees with assessment and plan at this time. Final Clinical Impressions(s) / ED Diagnoses   Final diagnoses:  Right sided abdominal pain  Right-sided chest pain    ED Discharge Orders        Ordered    traMADol (ULTRAM) 50 MG tablet  Every 12 hours PRN     11/01/17 1523       Renita Papa, PA-C 11/01/17 1541    Davonna Belling, MD 11/02/17 [phone]

## 2017-11-01 NOTE — ED Notes (Signed)
Patient transported to CT 

## 2017-11-02 ENCOUNTER — Other Ambulatory Visit: Payer: Self-pay | Admitting: Thoracic Surgery (Cardiothoracic Vascular Surgery)

## 2017-11-02 ENCOUNTER — Other Ambulatory Visit: Payer: Self-pay

## 2017-11-02 ENCOUNTER — Telehealth: Payer: Self-pay

## 2017-11-02 ENCOUNTER — Ambulatory Visit (HOSPITAL_COMMUNITY)
Admission: RE | Admit: 2017-11-02 | Discharge: 2017-11-02 | Disposition: A | Discharge status: Home / Self Care | Payer: Medicare HMO | Source: Ambulatory Visit | Attending: Thoracic Surgery (Cardiothoracic Vascular Surgery) | Admitting: Thoracic Surgery (Cardiothoracic Vascular Surgery)

## 2017-11-02 ENCOUNTER — Other Ambulatory Visit (HOSPITAL_COMMUNITY): Payer: Self-pay | Admitting: Student

## 2017-11-02 DIAGNOSIS — C3490 Malignant neoplasm of unspecified part of unspecified bronchus or lung: Secondary | ICD-10-CM | POA: Diagnosis not present

## 2017-11-02 DIAGNOSIS — Z538 Procedure and treatment not carried out for other reasons: Secondary | ICD-10-CM | POA: Diagnosis not present

## 2017-11-02 DIAGNOSIS — J90 Pleural effusion, not elsewhere classified: Secondary | ICD-10-CM

## 2017-11-02 DIAGNOSIS — Z9889 Other specified postprocedural states: Secondary | ICD-10-CM

## 2017-11-02 MED ORDER — LIDOCAINE HCL (PF) 1 % IJ SOLN
INTRAMUSCULAR | Status: AC | PRN
  Administered 2017-11-02: 10 mL

## 2017-11-02 MED ORDER — LIDOCAINE 2% (20 MG/ML) 5 ML SYRINGE
INTRAMUSCULAR | Status: AC
  Filled 2017-11-02: qty 5

## 2017-11-02 NOTE — Progress Notes (Signed)
Patient presented to radiology department for thoracentesis.  Limited US Chest performed and shows a small amount of fluid not amenable to thoracentesis at this time.  Discussed with patient. Results of limited US Chest dictated separately.  Brynda Greathouse, MS RD PA-C

## 2017-11-02 NOTE — Telephone Encounter (Signed)
[name] was instructed to go to [facility] on 1st floor today @ 2:00 pm for a Ultrasound guided right thoracentesis due to right Pleural effusion. She states that her daughter will bring her and that she understands.

## 2017-11-03 ENCOUNTER — Telehealth: Payer: Self-pay | Admitting: *Deleted

## 2017-11-03 NOTE — Progress Notes (Signed)
  Radiation Oncology         [phone]) [phone] ________________________________  Name: Sayana F Mochizuki MRN: [id number]  Date: 09/30/2017  DOB: 06-03-1946   SPECIAL TREATMENT PROCEDURE   3D TREATMENT PLANNING AND DOSIMETRY: The patient's radiation plan was reviewed and approved by neurosurgery and radiation oncology prior to treatment. It showed 3-dimensional radiation distributions overlaid onto the planning CT/MRI image set. The [facility] for the target structures as well as the organs at risk were reviewed. The documentation of the 3D plan and dosimetry are filed in the radiation oncology EMR.   NARRATIVE: The patient was brought to the TrueBeam stereotactic radiation treatment machine and placed supine on the CT couch. The head frame was applied, and the patient was set up for stereotactic radiosurgery. Neurosurgery was present for the set-up and delivery   SIMULATION VERIFICATION: In the couch zero-angle position, the patient underwent Exactrac imaging using the Brainlab system with orthogonal KV images. These were carefully aligned and repeated to confirm treatment position for each of the isocenters. The Exactrac snap film verification was repeated at each couch angle.   SPECIAL TREATMENT PROCEDURE: The patient received stereotactic radiosurgery to the following target:  PTV1 target was treated using 3 Arcs to a prescription dose of 20 Gy. ExacTrac Snap verification was performed for each couch angle.   STEREOTACTIC TREATMENT MANAGEMENT: Following delivery, the patient was transported to nursing in stable condition and monitored for possible acute effects. Vital signs were recorded . The patient tolerated treatment without significant acute effects, and was discharged to home in stable condition.  PLAN: Follow-up in one month.   ------------------------------------------------  Jodelle Gross, MD, PhD

## 2017-11-03 NOTE — Telephone Encounter (Signed)
CALLED PATIENT TO RESCHEDULE MISSED FU APPT. FOR 11-01-17,DUE TO SNOW, RESCHEDULED FOR 11-22-17 @ 2:30 PM, SPOKE WITH PATIENT AND SHE IS AWARE OF THIS APPT.

## 2017-11-03 NOTE — Progress Notes (Signed)
  Radiation Oncology         [phone]) [phone] ________________________________  Name: [name] MRN: [id number]  Date: 09/27/2017  DOB: 09-24-1946  DIAGNOSIS:     ICD-10-CM   1. Brain metastasis ([place]) C79.31     NARRATIVE:  The patient was brought to the [place].  Identity was confirmed.  All relevant records and images related to the planned course of therapy were reviewed.  The patient freely provided informed written consent to proceed with treatment after reviewing the details related to the planned course of therapy. The consent form was witnessed and verified by the simulation staff. Intravenous access was established for contrast administration. Then, the patient was set-up in a stable reproducible supine position for radiation therapy.  A relocatable thermoplastic stereotactic head frame was fabricated for precise immobilization.  CT images were obtained.  Surface markings were placed.  The CT images were loaded into the planning software and fused with the patient's targeting MRI scan.  Then the target and avoidance structures were contoured.  Treatment planning then occurred.  The radiation prescription was entered and confirmed.  I have requested 3D planning  I have requested a DVH of the following structures: Brain stem, brain, left eye, right eye, lenses, optic chiasm, target volumes, uninvolved brain, and normal tissue.    SPECIAL TREATMENT PROCEDURE:  The planned course of therapy using radiation constitutes a special treatment procedure. Special care is required in the management of this patient for the following reasons. This treatment constitutes a Special Treatment Procedure for the following reason: High dose per fraction requiring special monitoring for increased toxicities of treatment including daily imaging.  The special nature of the planned course of radiotherapy will require increased physician supervision and oversight to ensure patient's safety with optimal  treatment outcomes.  PLAN:  The patient will receive 20 Gy in 1 fraction.   ------------------------------------------------  Jodelle Gross, MD, PhD

## 2017-11-04 ENCOUNTER — Encounter: Payer: Medicare HMO | Attending: Physical Medicine & Rehabilitation | Admitting: Physical Medicine & Rehabilitation

## 2017-11-04 ENCOUNTER — Telehealth: Payer: Self-pay | Admitting: Radiation Therapy

## 2017-11-04 ENCOUNTER — Ambulatory Visit: Payer: Medicare HMO | Admitting: Internal Medicine

## 2017-11-04 ENCOUNTER — Telehealth: Payer: Self-pay | Admitting: Medical Oncology

## 2017-11-04 NOTE — Telephone Encounter (Signed)
She [name] need to start radiation soon if surgery is not an option.

## 2017-11-04 NOTE — Telephone Encounter (Signed)
[name] called requesting information about her 12/13 appointments. Upon returning her call, she expressed that she is very unhappy with the fact that nothing has started regarding treatment to her lung. She was last seen by Dr. Roxan Hockey and referred to have some fluid removed, but that could not be done and now she has been waiting for a return call from Dr. Leonarda Salon office for next steps. She said that she is in a tremendous amount of pain, and frustrated that "nothing is being done." She said if Dr. Roxan Hockey will not do something soon, she would like a new MD.    I left a message with Dr. Leonarda Salon nurse requesting that she call the patient and move her scheduled appointment up sooner.   As far as the appointment she had scheduled with Dr. Mickeal Skinner today, that will need to be rescheduled. I will share this with [name], his nurse.   Mont Dutton R.T.(R)(T) Special Procedure Navigator

## 2017-11-04 NOTE — Telephone Encounter (Signed)
dtr calls reporting pt has "severe pain in left lateral chest" since last week before visit to ED. She cannot lay flat due to increased pain. Tramadol not effective. Saw PCP yesterday who recommended pt  see Mohamed. Thoracentesis attempted but very little fluid.CT changes noted.

## 2017-11-05 ENCOUNTER — Telehealth: Payer: Self-pay

## 2017-11-05 NOTE — Telephone Encounter (Signed)
Mrs Timko was notified of new appointment date and time with Dr Roxan Hockey. 11/09/2018 @ 10:15 with CXR prior.

## 2017-11-08 ENCOUNTER — Other Ambulatory Visit: Payer: Self-pay | Admitting: *Deleted

## 2017-11-08 DIAGNOSIS — R0602 Shortness of breath: Secondary | ICD-10-CM

## 2017-11-08 DIAGNOSIS — C3411 Malignant neoplasm of upper lobe, right bronchus or lung: Secondary | ICD-10-CM

## 2017-11-09 ENCOUNTER — Other Ambulatory Visit: Payer: Self-pay | Admitting: *Deleted

## 2017-11-09 ENCOUNTER — Ambulatory Visit
Admission: RE | Admit: 2017-11-09 | Discharge: 2017-11-09 | Disposition: A | Discharge status: Home / Self Care | Payer: Medicare HMO | Source: Ambulatory Visit | Attending: Thoracic Surgery (Cardiothoracic Vascular Surgery) | Admitting: Thoracic Surgery (Cardiothoracic Vascular Surgery)

## 2017-11-09 ENCOUNTER — Other Ambulatory Visit: Payer: Self-pay

## 2017-11-09 ENCOUNTER — Ambulatory Visit: Payer: Medicare HMO | Admitting: Thoracic Surgery (Cardiothoracic Vascular Surgery)

## 2017-11-09 ENCOUNTER — Encounter: Payer: Self-pay | Admitting: Thoracic Surgery (Cardiothoracic Vascular Surgery)

## 2017-11-09 VITALS — BP 156/85 | HR 95 | Resp 18 | Ht 67.0 in | Wt 219.0 lb

## 2017-11-09 DIAGNOSIS — C3492 Malignant neoplasm of unspecified part of left bronchus or lung: Secondary | ICD-10-CM | POA: Diagnosis not present

## 2017-11-09 DIAGNOSIS — C3411 Malignant neoplasm of upper lobe, right bronchus or lung: Secondary | ICD-10-CM

## 2017-11-09 DIAGNOSIS — G939 Disorder of brain, unspecified: Secondary | ICD-10-CM

## 2017-11-09 DIAGNOSIS — J90 Pleural effusion, not elsewhere classified: Secondary | ICD-10-CM

## 2017-11-09 DIAGNOSIS — G9389 Other specified disorders of brain: Secondary | ICD-10-CM

## 2017-11-09 DIAGNOSIS — R0602 Shortness of breath: Secondary | ICD-10-CM

## 2017-11-09 MED ORDER — HYDROMORPHONE HCL 2 MG PO TABS: 2.0000 mg | ORAL_TABLET | ORAL | 0 refills | Status: DC | PRN

## 2017-11-09 NOTE — Addendum Note (Signed)
Addended by: Amado Coe on: 11/09/2017 11:59 AM   Modules accepted: Orders

## 2017-11-09 NOTE — Progress Notes (Signed)
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  She originally presented with a complaint of increasing cough and shortness of breath.  A chest x-ray showed a right upper lobe opacity.  By CT she had a 2.6 x 1.9 cm spiculated right upper lobe nodule with central cavitation.  PET showed it was hypermetabolic with an SUV of 18.  There was no evidence of regional or distant metastases.    She was scheduled for surgery but on the day of surgery she complained of numbness in her right arm and heaviness in her right leg.  A CT of the head showed a left frontal lobe metastasis.  She has had SBRT/SRT for that.  She was admitted with lower extremity weakness after that.  She went to rehab.  I saw her back on 10/26/2017.  She did not look good at that visit.  She had just gotten out of rehab.  She was not a candidate for surgical intervention.  She was still working with PT and OT.  On 11/01/2017 she was seen in the emergency room with complaints of right-sided abdominal and chest pain.  She was given a prescription for tramadol and instructed to follow-up. Of note on a CT of the chest was done that day that showed an increase in size of the right upper lobe mass.  There also was a new pleural effusion.  We scheduled her for an ultrasound-guided thoracentesis, but the radiology did not feel there was enough fluid to safely drain.  She continues to have pain.  Pain is right sided and anterior.  She does complain of a cough.  It is worse when she is lying down.  She says that her walking is improving.  She is able to walk on her own with a cane.  Past Medical History:  Diagnosis Date  . Anxiety   . Arthritis   .  Cancer ([place])   . Chronic back pain greater than 3 months duration   . Chronic neck pain   . COPD (chronic obstructive pulmonary disease) ([place])   . Cough   . Diabetes mellitus   . Dyspnea   . GERD (gastroesophageal reflux disease)   . H/O hiatal hernia   . Headache(784.0)   . Hemorrhoids   . Herniated disc   . History of kidney stones   . Hx of echocardiogram 2010   normal EF  . Hypertension   . Palpitations   . Shortness of breath on exertion 11/10/11   "sometimes"  . Vertigo   . Vertigo     Current Outpatient Medications  Medication Sig Dispense Refill  . acetaminophen (TYLENOL) 500 MG tablet Take 1 tablet (500 mg total) every 6 (six) hours as needed by mouth for fever. 30 tablet 0  . albuterol (PROVENTIL) (2.5 MG/3ML) 0.083% nebulizer solution USE 1 VIAL EVERY 6 HOURS AS NEEDED FOR SHORTNESS OF BREATH  0  . albuterol (VENTOLIN HFA) 108 (90 Base) MCG/ACT inhaler Inhale 2 puffs into the lungs every 6 (six) hours as needed for wheezing or shortness of breath.    . ALPRAZolam (XANAX) 0.25 MG tablet Take  1 tablet (0.25 mg total) by mouth 3 (three) times daily as needed for anxiety. 30 tablet 0  . amLODipine (NORVASC) 5 MG tablet Take 5 mg by mouth 2 (two) times daily.  1  . cholecalciferol (VITAMIN D) 1000 units tablet Take 1,000 Units by mouth daily.    . citalopram (CELEXA) 10 MG tablet Take 0.5 tablets (5 mg total) by mouth daily. 30 tablet 0  . esomeprazole (NEXIUM) 40 MG capsule Take 1 capsule (40 mg total) by mouth daily as needed (acid reflux). 30 capsule 2  . gabapentin (NEURONTIN) 100 MG capsule Take 1 capsule (100 mg total) by mouth daily as needed (for leg pain). 30 capsule 1  . HYDROcodone-acetaminophen (NORCO/VICODIN) 5-325 MG tablet Take 1 tablet by mouth every 4 (four) hours as needed for moderate pain or severe pain (As needed for pain.). 20 tablet 0  . JANUMET 50-500 MG tablet Take 1 tablet by mouth 2 (two) times daily.  1  . levETIRAcetam (KEPPRA) 500 MG tablet Take  1 tablet (500 mg total) by mouth 2 (two) times daily. 60 tablet 3  . loratadine (CLARITIN) 10 MG tablet Take 10 mg by mouth daily as needed for allergies.    Marland Kitchen losartan (COZAAR) 25 MG tablet Take 1 tablet (25 mg total) by mouth daily. 30 tablet 3  . meclizine (ANTIVERT) 25 MG tablet Take 1 tablet (25 mg total) by mouth 3 (three) times daily. 90 tablet 0  . metoprolol succinate (TOPROL-XL) 25 MG 24 hr tablet Take 1 tablet (25 mg total) by mouth daily. 30 tablet 1  . ondansetron (ZOFRAN ODT) 4 MG disintegrating tablet Take 1 tablet (4 mg total) by mouth every 8 (eight) hours as needed for nausea or vomiting. (Patient taking differently: Take 4 mg by mouth every 8 (eight) hours as needed for nausea or vomiting. DISSOLVE IN THE MOUTH) 20 tablet 0  . ONETOUCH VERIO test strip USE AS DIRECTED EVERY DAY  3  . polyethylene glycol (MIRALAX / GLYCOLAX) packet Take 17 g daily as needed by mouth for moderate constipation. 14 each 0  . potassium chloride (KLOR-CON M10) 10 MEQ tablet Take 1 tablet (10 mEq total) by mouth daily. 30 tablet 0  . rosuvastatin (CRESTOR) 10 MG tablet Take 1 tablet (10 mg total) by mouth daily. 30 tablet 0  . traZODone (DESYREL) 50 MG tablet Take 1 tablet (50 mg total) by mouth at bedtime. 30 tablet 0  . umeclidinium bromide (INCRUSE ELLIPTA) 62.5 MCG/INH AEPB Inhale 1 puff into the lungs daily. 1 each 1  . HYDROmorphone (DILAUDID) 2 MG tablet Take 1 tablet (2 mg total) by mouth every 4 (four) hours as needed for severe pain. 25 tablet 0   No current facility-administered medications for this visit.    Facility-Administered Medications Ordered in Other Visits  Medication Dose Route Frequency Provider Last Rate Last Dose  . fentaNYL (SUBLIMAZE) injection    Anesthesia Intra-op Candis Shine, CRNA   50 mcg at 09/09/17 0718  . lactated ringers infusion    Continuous PRN Harder, Rebeca Alert, CRNA      . lactated ringers infusion    Continuous PRN Harder, Rebeca Alert, CRNA      . midazolam  (VERSED) 5 MG/5ML injection    Anesthesia Intra-op Candis Shine, CRNA   1 mg at 09/09/17 0718    Physical Exam BP (!) 156/85 (BP Location: Left Arm, Patient Position: Sitting, Cuff Size: Large)   Pulse 95   Resp 18  Ht 5\' 7"  (1.702 m)   Wt 219 lb (99.3 kg)   SpO2 97% Comment: ON RA  BMI 34.30 kg/m  Chronically ill-appearing 71 year old woman in no acute distress Psychomotor slowing depressed affect Lungs absent breath sounds at right base Cardiac regular rate and rhythm normal S1 and S2  Diagnostic Tests: CHEST  2 VIEW  COMPARISON:  Chest radiographs and CT 11/01/2017  FINDINGS: The cardiac silhouette is normal in size. A right upper lobe mass is similar in size to the recent prior radiographs. There is a moderate-sized right pleural effusion with associated loculated fluid and/or pleural thickening extending to the apex. The pleural effusion has enlarged from the prior radiographs. Right basilar airspace opacity has increased and likely represents atelectasis. Left basilar atelectasis on the prior radiographs has resolved. No pneumothorax is identified. Right upper quadrant abdominal surgical clips and prior cervical spine fusion are noted.  IMPRESSION: 1. Enlarging right pleural effusion with worsening right basilar atelectasis. 2. Unchanged right upper lobe mass.   Electronically Signed   By: Logan Bores M.D.   On: 11/09/2017 10:30 I personally reviewed the chest x-ray images and concur with the findings noted above.  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  She had isolated brain metastasis treated with SBRT about 6 weeks ago.  She had lower extremity weakness and required admission and then inpatient rehab after that.  She has been slowly recovering.  I saw in the office a  couple of weeks ago and she was essentially wheelchair-bound at that time.  Today she was able to walk 392 feet with a cane and 6 minutes.  This is far below the level she would need to achieve to be considered a candidate for a lobectomy, but is a significant improvement over where she was 2 weeks ago.  She continues to have pain that is unrelieved with Tylenol and tramadol.  She is allergic to oxycodone.  I gave her prescription for Dilaudid 2 mg tablets she can use every 4 hours as needed but not to exceed 4 tablets a day.  25 tablets, no refills.  She was cautioned that this could cause cross reaction with her oxycodone allergy but essentially all narcotic medications fall into that category.  She should continue with physical therapy and ambulation.  Her right pleural effusion is larger than it was a week ago.  There should be plenty of fluid for thoracentesis at this point.  We are going to reschedule that procedure.  We will send the fluid for cytology, cultures, cell count, LDH, protein, and glucose.  I did discuss with Mr. [name] that there is still no guarantee that she would become a surgical candidate.  I suspect her window is closing given the increase in size of the mass.  She does not want a referral to radiation oncology at this point.  Plan: Ultrasound-guided thoracentesis right pleural effusion.  I will see her back as soon as possible after the test is done to review the results.  Melrose Nakayama, MD [facility] [phone]

## 2017-11-10 ENCOUNTER — Ambulatory Visit (HOSPITAL_COMMUNITY)
Admission: RE | Admit: 2017-11-10 | Discharge: 2017-11-10 | Disposition: A | Discharge status: Home / Self Care | Payer: Medicare HMO | Source: Ambulatory Visit | Attending: Radiology | Admitting: Radiology

## 2017-11-10 ENCOUNTER — Encounter (HOSPITAL_COMMUNITY): Payer: Self-pay | Admitting: Radiology

## 2017-11-10 ENCOUNTER — Ambulatory Visit (HOSPITAL_COMMUNITY)
Admission: RE | Admit: 2017-11-10 | Discharge: 2017-11-10 | Disposition: A | Discharge status: Home / Self Care | Payer: Medicare HMO | Source: Ambulatory Visit | Attending: Thoracic Surgery (Cardiothoracic Vascular Surgery) | Admitting: Thoracic Surgery (Cardiothoracic Vascular Surgery)

## 2017-11-10 ENCOUNTER — Other Ambulatory Visit (HOSPITAL_COMMUNITY): Payer: Self-pay | Admitting: Radiology

## 2017-11-10 DIAGNOSIS — J90 Pleural effusion, not elsewhere classified: Secondary | ICD-10-CM | POA: Insufficient documentation

## 2017-11-10 DIAGNOSIS — Z9889 Other specified postprocedural states: Secondary | ICD-10-CM

## 2017-11-10 HISTORY — PX: IR THORACENTESIS ASP PLEURAL SPACE W/IMG GUIDE: IMG5380

## 2017-11-10 LAB — BODY FLUID CELL COUNT WITH DIFFERENTIAL
Eos, Fluid: 1 %
Lymphs, Fluid: 64 %
MONOCYTE-MACROPHAGE-SEROUS FLUID: 8 % — AB (ref 50–90)
NEUTROPHIL FLUID: 27 % — AB (ref 0–25)
WBC FLUID: 6575 uL — AB (ref 0–1000)

## 2017-11-10 LAB — PROTEIN, PLEURAL OR PERITONEAL FLUID: Total protein, fluid: 4.4 g/dL

## 2017-11-10 LAB — GLUCOSE, PLEURAL OR PERITONEAL FLUID: GLUCOSE FL: 155 mg/dL

## 2017-11-10 LAB — LACTATE DEHYDROGENASE, PLEURAL OR PERITONEAL FLUID: LD, Fluid: 355 U/L — ABNORMAL HIGH (ref 3–23)

## 2017-11-10 LAB — GRAM STAIN

## 2017-11-10 MED ORDER — LIDOCAINE HCL (PF) 2 % IJ SOLN
INTRAMUSCULAR | Status: DC | PRN
  Administered 2017-11-10: 10 mL

## 2017-11-10 MED ORDER — LIDOCAINE 2% (20 MG/ML) 5 ML SYRINGE
INTRAMUSCULAR | Status: AC
  Filled 2017-11-10: qty 10

## 2017-11-10 NOTE — Procedures (Signed)
PROCEDURE SUMMARY:  Successful US guided right thoracentesis. Yielded 1.2 L of hazy dark yellow fluid. Pt tolerated procedure well. No immediate complications.  Specimen was sent for labs. CXR ordered.  Ascencion Dike PA-C 11/10/2017 10:48 AM

## 2017-11-12 ENCOUNTER — Other Ambulatory Visit: Payer: Self-pay

## 2017-11-12 ENCOUNTER — Encounter: Payer: Self-pay | Admitting: Internal Medicine

## 2017-11-12 ENCOUNTER — Ambulatory Visit (HOSPITAL_BASED_OUTPATIENT_CLINIC_OR_DEPARTMENT_OTHER): Payer: Medicare HMO | Admitting: Internal Medicine

## 2017-11-12 VITALS — BP 136/70 | HR 89 | Temp 98.0°F | Resp 18 | Ht 67.0 in | Wt 219.1 lb

## 2017-11-12 DIAGNOSIS — R569 Unspecified convulsions: Secondary | ICD-10-CM | POA: Diagnosis not present

## 2017-11-12 DIAGNOSIS — C7931 Secondary malignant neoplasm of brain: Secondary | ICD-10-CM

## 2017-11-12 DIAGNOSIS — C3411 Malignant neoplasm of upper lobe, right bronchus or lung: Secondary | ICD-10-CM | POA: Diagnosis not present

## 2017-11-12 NOTE — Progress Notes (Signed)
[facility] [place], [place] [address] [phone]   New Patient Evaluation  Date of Service: 11/12/17 Patient Name: [name] Patient MRN: [id number] Patient DOB: 1946-05-10 Provider: Ventura Sellers, MD  Identifying Statement:  [name] is a 71 y.o. female with Brain metastasis ([place]) [C79.31] who presents for initial consultation and evaluation regarding cancer associated neurologic deficits.    Referring Provider: Dixie Dials, MD [address] [place], [place] [address]  Primary Cancer: NSCLC stage IV  Oncologic History:  No history exists.    History of Present Illness: The patient's records from the referring physician were obtained and reviewed and the patient interviewed to confirm this HPI.  [name] presented to neurologic attention in October this year when she noted heavy/numb feeling in her right arm and leg.  This led to an MRI which demonstrated likely metastatic lesion in the right frontoparietal region.  This was treated with SRS by Dr. Lisbeth Renshaw in one fraction on 09/30/17.  Several days later, she experienced a seizure, described as "several minutes of involuntary shaking of the right arm and leg", followed by significant post-ictal weakness requiring hospitalization and rehab.  Keppra and decadron were administered, the latter of which was tapered off during rehab stay.  Since her hospitalization she has returned back to her previous baseline, ambulating independently.  She has an upcoming visit with CT surgery to determine best course of action for local therapy to the lung for her NSCLC.        Medications: Current Outpatient Medications on File Prior to Visit  Medication Sig Dispense Refill  . acetaminophen (TYLENOL) 500 MG tablet Take 1 tablet (500 mg total) every 6 (six) hours as needed by mouth for fever. 30 tablet 0  . albuterol (PROVENTIL) (2.5 MG/3ML) 0.083% nebulizer solution USE 1 VIAL EVERY  6 HOURS AS NEEDED FOR SHORTNESS OF BREATH  0  . albuterol (VENTOLIN HFA) 108 (90 Base) MCG/ACT inhaler Inhale 2 puffs into the lungs every 6 (six) hours as needed for wheezing or shortness of breath.    . ALPRAZolam (XANAX) 0.25 MG tablet Take 1 tablet (0.25 mg total) by mouth 3 (three) times daily as needed for anxiety. 30 tablet 0  . amLODipine (NORVASC) 5 MG tablet Take 5 mg by mouth 2 (two) times daily.  1  . cholecalciferol (VITAMIN D) 1000 units tablet Take 1,000 Units by mouth daily.    . citalopram (CELEXA) 10 MG tablet Take 0.5 tablets (5 mg total) by mouth daily. 30 tablet 0  . esomeprazole (NEXIUM) 40 MG capsule Take 1 capsule (40 mg total) by mouth daily as needed (acid reflux). 30 capsule 2  . gabapentin (NEURONTIN) 100 MG capsule Take 1 capsule (100 mg total) by mouth daily as needed (for leg pain). 30 capsule 1  . HYDROcodone-acetaminophen (NORCO/VICODIN) 5-325 MG tablet Take 1 tablet by mouth every 4 (four) hours as needed for moderate pain or severe pain (As needed for pain.). 20 tablet 0  . HYDROmorphone (DILAUDID) 2 MG tablet Take 1 tablet (2 mg total) by mouth every 4 (four) hours as needed for severe pain. 25 tablet 0  . JANUMET 50-500 MG tablet Take 1 tablet by mouth 2 (two) times daily.  1  . levETIRAcetam (KEPPRA) 500 MG tablet Take 1 tablet (500 mg total) by mouth 2 (two) times daily. 60 tablet 3  . loratadine (CLARITIN) 10 MG tablet Take 10 mg by mouth  daily as needed for allergies.    Marland Kitchen losartan (COZAAR) 25 MG tablet Take 1 tablet (25 mg total) by mouth daily. 30 tablet 3  . meclizine (ANTIVERT) 25 MG tablet Take 1 tablet (25 mg total) by mouth 3 (three) times daily. 90 tablet 0  . metoprolol succinate (TOPROL-XL) 25 MG 24 hr tablet Take 1 tablet (25 mg total) by mouth daily. 30 tablet 1  . ondansetron (ZOFRAN ODT) 4 MG disintegrating tablet Take 1 tablet (4 mg total) by mouth every 8 (eight) hours as needed for nausea or vomiting. (Patient taking differently: Take 4 mg by  mouth every 8 (eight) hours as needed for nausea or vomiting. DISSOLVE IN THE MOUTH) 20 tablet 0  . ONETOUCH VERIO test strip USE AS DIRECTED EVERY DAY  3  . polyethylene glycol (MIRALAX / GLYCOLAX) packet Take 17 g daily as needed by mouth for moderate constipation. 14 each 0  . potassium chloride (KLOR-CON M10) 10 MEQ tablet Take 1 tablet (10 mEq total) by mouth daily. 30 tablet 0  . rosuvastatin (CRESTOR) 10 MG tablet Take 1 tablet (10 mg total) by mouth daily. 30 tablet 0  . traZODone (DESYREL) 50 MG tablet Take 1 tablet (50 mg total) by mouth at bedtime. 30 tablet 0  . umeclidinium bromide (INCRUSE ELLIPTA) 62.5 MCG/INH AEPB Inhale 1 puff into the lungs daily. 1 each 1  . [DISCONTINUED] budesonide-formoterol (SYMBICORT) 160-4.5 MCG/ACT inhaler Inhale 2 puffs into the lungs 2 (two) times daily. (Patient taking differently: Inhale 2 puffs into the lungs 2 (two) times daily as needed (wheezing). ) 1 Inhaler 12   Current Facility-Administered Medications on File Prior to Visit  Medication Dose Route Frequency Provider Last Rate Last Dose  . fentaNYL (SUBLIMAZE) injection    Anesthesia Intra-op Kirsten Shine, CRNA   50 mcg at 09/09/17 0718  . lactated ringers infusion    Continuous PRN Harder, Kirsten Alert, CRNA      . lactated ringers infusion    Continuous PRN Harder, Kirsten Alert, CRNA      . midazolam (VERSED) 5 MG/5ML injection    Anesthesia Intra-op Kirsten Shine, CRNA   1 mg at 09/09/17 1610    Allergies:  Allergies  Allergen Reactions  . Oxycodone Hives and Itching   Past Medical History:  Past Medical History:  Diagnosis Date  . Anxiety   . Arthritis   . Cancer ([facility])   . Chronic back pain greater than 3 months duration   . Chronic neck pain   . COPD (chronic obstructive pulmonary disease) ([facility])   . Cough   . Diabetes mellitus   . Dyspnea   . GERD (gastroesophageal reflux disease)   . H/O hiatal hernia   . Headache(784.0)   . Hemorrhoids   . Herniated disc   . History of  kidney stones   . Hx of echocardiogram 2010   normal EF  . Hypertension   . Palpitations   . Shortness of breath on exertion 11/10/11   "sometimes"  . Vertigo   . Vertigo    Past Surgical History:  Past Surgical History:  Procedure Laterality Date  . ABDOMINAL HYSTERECTOMY  1997?   "partial"  . ANTERIOR CERVICAL DECOMP/DISCECTOMY FUSION N/A 12/25/2016   Procedure: ANTERIOR CERVICAL DECOMPRESSION/DISCECTOMY FUSION CERVICAL FOUR CERVICAL FIVE;  Surgeon: Ashok Pall, MD;  Location: [facility];  Service: Neurosurgery;  Laterality: N/A;  . CHOLECYSTECTOMY  ~ 2008  . COLONOSCOPY    . EYE SURGERY  cateracts  . FRACTURE SURGERY  2004?   right shoulder  . IR THORACENTESIS ASP PLEURAL SPACE W/IMG GUIDE  11/10/2017  . KNEE ARTHROSCOPY  2004   right  . KNEE SURGERY     right  . neck fusion    . SHOULDER OPEN ROTATOR CUFF REPAIR  07/20/2011   left  . TONSILLECTOMY     Social History:  Social History   Socioeconomic History  . Marital status: Married    Spouse name: Not on file  . Number of children: Not on file  . Years of education: Not on file  . Highest education level: Not on file  Social Needs  . Financial resource strain: Not on file  . Food insecurity - worry: Not on file  . Food insecurity - inability: Not on file  . Transportation needs - medical: Not on file  . Transportation needs - non-medical: Not on file  Occupational History  . Not on file  Tobacco Use  . Smoking status: Former Smoker    Packs/day: 0.25    Years: 30.00    Pack years: 7.50    Types: Cigarettes    Last attempt to quit: 07/08/2017    Years since quitting: 0.3  . Smokeless tobacco: Never Used  . Tobacco comment: 4-5 cigs / day  Substance and Sexual Activity  . Alcohol use: No  . Drug use: No  . Sexual activity: No  Other Topics Concern  . Not on file  Social History Narrative  . Not on file   Family History:  Family History  Problem Relation Age of Onset  . Lung cancer Sister      Review of Systems: Constitutional: Denies fevers, chills or abnormal weight loss Eyes: Denies blurriness of vision Ears, nose, mouth, throat, and face: Denies mucositis or sore throat Respiratory: Denies cough, dyspnea or wheezes Cardiovascular: Denies palpitation, chest discomfort or lower extremity swelling Gastrointestinal:  Denies nausea, constipation, diarrhea GU: Denies dysuria or incontinence Skin: Denies abnormal skin rashes Neurological: Per HPI Musculoskeletal: Denies joint pain, back or neck discomfort. No decrease in ROM Behavioral/Psych: Denies anxiety, disturbance in thought content, and mood instability   Physical Exam: Vitals:   11/12/17 0948  BP: 136/70  Pulse: 89  Resp: 18  Temp: 98 F (36.7 C)  SpO2: 99%   KPS: 90. General: White, cooperative, pleasant, in no acute distress Head: Normal EENT: No conjunctival injection or scleral icterus. Oral mucosa moist Lungs: Resp effort normal Cardiac: Regular rate and rhythm Abdomen: Soft, non-distended abdomen Skin: No rashes cyanosis or petechiae. Extremities: No clubbing or edema  Neurologic Exam: Mental Status: Awake, White, attentive to examiner. Oriented to self and environment. Language is fluent with intact comprehension.  Cranial Nerves: Visual acuity is grossly normal. Visual fields are full. Extra-ocular movements intact. No ptosis. Face is symmetric, tongue midline. Motor: Tone and bulk are normal. Power is full in both arms and legs. Reflexes are symmetric, no pathologic reflexes present. Intact finger to nose bilaterally Sensory: Intact to light touch and temperature Gait: Normal and deferred tandem   Labs: I have reviewed the data as listed    Component Value Date/Time   NA 133 (L) 11/01/2017 1105   NA 139 10/28/2017 1242   K 3.8 11/01/2017 1105   K 3.8 10/28/2017 1242   CL 105 11/01/2017 1105   CO2 19 (L) 11/01/2017 1105   CO2 24 10/28/2017 1242   GLUCOSE 161 (H) 11/01/2017 1105    GLUCOSE 164 (H) 10/28/2017 1242  BUN 11 11/01/2017 1105   BUN 15.4 10/28/2017 1242   CREATININE 1.05 (H) 11/01/2017 1105   CREATININE 1.1 10/28/2017 1242   CALCIUM 10.0 11/01/2017 1105   CALCIUM 10.4 10/28/2017 1242   PROT 7.1 11/01/2017 1105   PROT 7.4 10/28/2017 1242   ALBUMIN 3.0 (L) 11/01/2017 1105   ALBUMIN 2.9 (L) 10/28/2017 1242   AST 19 11/01/2017 1105   AST 11 10/28/2017 1242   ALT 11 (L) 11/01/2017 1105   ALT 8 10/28/2017 1242   ALKPHOS 120 11/01/2017 1105   ALKPHOS 145 10/28/2017 1242   BILITOT 0.5 11/01/2017 1105   BILITOT 0.43 10/28/2017 1242   GFRNONAA 53 (L) 11/01/2017 1105   GFRAA >60 11/01/2017 1105   Lab Results  Component Value Date   WBC 10.9 (H) 11/01/2017   NEUTROABS 4.7 10/28/2017   HGB 13.8 11/01/2017   HCT 41.8 11/01/2017   MCV 88.7 11/01/2017   PLT 266 11/01/2017    Imaging: [facility] Clinician Interpretation: I have personally reviewed the radiological images as listed.  My interpretation, in the context of the patient's clinical presentation, is hemorrhage and edema related to recent radiation and seizure  CLINICAL DATA:  Code stroke. Focal neuro deficit less than 6 hours. Right-sided weakness with twitching. Lung mass with brain lesions consistent with metastatic disease.  EXAM: CT HEAD WITHOUT CONTRAST  TECHNIQUE: Contiguous axial images were obtained from the base of the skull through the vertex without intravenous contrast.  COMPARISON:  MRI 09/23/2017.  CT 09/09/2017  FINDINGS: Brain: Vasogenic edema in the left frontal parietal lobe as noted on prior MRI. 11.5 mm hyperdense nodule within the edema compatible with hemorrhagic metastatic disease. This lesion shows enhancement on the prior study.  No other areas of hemorrhage or edema. Ventricle size normal. No acute ischemic infarction.  Vascular: Negative for hyperdense vessel.  Skull: Negative  Sinuses/Orbits: Negative  Other: None  ASPECTS ([place] Stroke  Program Early CT Score)  - Ganglionic level infarction (caudate, lentiform nuclei, internal capsule, insula, M1-M3 cortex): 7  - Supraganglionic infarction (M4-M6 cortex): 3  Total score (0-10 with 10 being normal): 10  IMPRESSION: 1. 11 mm hemorrhagic metastatic deposit in the left frontal parietal lobe with surrounding edema. The hemorrhage is hyperdense and appears new compared with prior studies 2. ASPECTS is 10  These results were called by telephone at the time of interpretation on 10/02/2017 at 6:59 am to Dr. Cheral Marker, who verbally acknowledged these results.   Assessment/Plan 1. Brain metastasis ([facility])  2. 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  This event was provoked by radiosurgery and local hemorrhage; no further seizures have occurred.    She should continue to taper off decadron and scheduled.  Keppra can continue at 500mg  q12.    We stressed the importance of continuing physical therapy, which has been very helpful.  We spent twenty additional minutes teaching regarding the natural history, biology, and historical experience in the treatment of neurologic complications of cancer. We also provided teaching sheets for the patient to take home as an additional resource.  We appreciate the opportunity to participate in the care of Laurelyn F Yarrow.   She should follow up in mid-February with an MRI brain for evaluation, which would be 3 months post-SRS.  All questions were answered. The patient knows to call the clinic with any problems, questions or concerns. No barriers to learning were detected.  The total time spent in  the encounter was 45 minutes and more than 50% was on counseling and review of test results   Ventura Sellers, MD Medical Director of Neuro-Oncology [facility] [place] 11/12/17 9:39 AM

## 2017-11-15 LAB — CULTURE, BODY FLUID W GRAM STAIN -BOTTLE: Culture: NO GROWTH

## 2017-11-17 ENCOUNTER — Encounter: Payer: Self-pay | Admitting: *Deleted

## 2017-11-17 ENCOUNTER — Telehealth: Payer: Self-pay | Admitting: Internal Medicine

## 2017-11-17 NOTE — Progress Notes (Signed)
Oncology Nurse Navigator Documentation  Oncology Nurse Navigator Flowsheets 11/17/2017  Navigator Location [facility]  Navigator Encounter Type Other/I followed on [name]'s PDL 1 results. I did not see in EMR.  I contacted pathology dept for an update.   Treatment Phase Follow-up  Barriers/Navigation Needs Coordination of Care  Interventions Coordination of Care  Acuity Level 1  Time Spent with Patient 30

## 2017-11-17 NOTE — Telephone Encounter (Signed)
Left message for patient regarding upcoming February appointments.

## 2017-11-18 ENCOUNTER — Other Ambulatory Visit: Payer: Self-pay | Admitting: *Deleted

## 2017-11-18 DIAGNOSIS — C3400 Malignant neoplasm of unspecified main bronchus: Secondary | ICD-10-CM

## 2017-11-18 MED ORDER — HYDROMORPHONE HCL 2 MG PO TABS: 2.0000 mg | ORAL_TABLET | ORAL | 0 refills | Status: DC | PRN

## 2017-11-19 ENCOUNTER — Encounter (HOSPITAL_COMMUNITY): Payer: Self-pay | Admitting: Emergency Medicine

## 2017-11-19 ENCOUNTER — Emergency Department (HOSPITAL_COMMUNITY): Payer: Medicare HMO

## 2017-11-19 ENCOUNTER — Inpatient Hospital Stay (HOSPITAL_COMMUNITY)
Admission: EM | Admit: 2017-11-19 | Discharge: 2017-12-01 | DRG: 871 | Disposition: A | Discharge status: Hospice | Payer: Medicare HMO | Attending: Cardiovascular Disease | Admitting: Cardiovascular Disease

## 2017-11-19 ENCOUNTER — Other Ambulatory Visit: Payer: Self-pay | Admitting: Thoracic Surgery (Cardiothoracic Vascular Surgery)

## 2017-11-19 DIAGNOSIS — A419 Sepsis, unspecified organism: Secondary | ICD-10-CM | POA: Diagnosis present

## 2017-11-19 DIAGNOSIS — E669 Obesity, unspecified: Secondary | ICD-10-CM | POA: Diagnosis present

## 2017-11-19 DIAGNOSIS — J90 Pleural effusion, not elsewhere classified: Secondary | ICD-10-CM

## 2017-11-19 DIAGNOSIS — Z7984 Long term (current) use of oral hypoglycemic drugs: Secondary | ICD-10-CM

## 2017-11-19 DIAGNOSIS — Z923 Personal history of irradiation: Secondary | ICD-10-CM

## 2017-11-19 DIAGNOSIS — Z90710 Acquired absence of both cervix and uterus: Secondary | ICD-10-CM

## 2017-11-19 DIAGNOSIS — Z801 Family history of malignant neoplasm of trachea, bronchus and lung: Secondary | ICD-10-CM

## 2017-11-19 DIAGNOSIS — Z9049 Acquired absence of other specified parts of digestive tract: Secondary | ICD-10-CM

## 2017-11-19 DIAGNOSIS — E860 Dehydration: Secondary | ICD-10-CM | POA: Diagnosis present

## 2017-11-19 DIAGNOSIS — Z23 Encounter for immunization: Secondary | ICD-10-CM

## 2017-11-19 DIAGNOSIS — R0602 Shortness of breath: Secondary | ICD-10-CM

## 2017-11-19 DIAGNOSIS — R0600 Dyspnea, unspecified: Secondary | ICD-10-CM

## 2017-11-19 DIAGNOSIS — Z66 Do not resuscitate: Secondary | ICD-10-CM | POA: Diagnosis present

## 2017-11-19 DIAGNOSIS — Z87891 Personal history of nicotine dependence: Secondary | ICD-10-CM

## 2017-11-19 DIAGNOSIS — Z515 Encounter for palliative care: Secondary | ICD-10-CM

## 2017-11-19 DIAGNOSIS — N179 Acute kidney failure, unspecified: Secondary | ICD-10-CM | POA: Diagnosis not present

## 2017-11-19 DIAGNOSIS — J9601 Acute respiratory failure with hypoxia: Secondary | ICD-10-CM | POA: Diagnosis present

## 2017-11-19 DIAGNOSIS — Z8673 Personal history of transient ischemic attack (TIA), and cerebral infarction without residual deficits: Secondary | ICD-10-CM

## 2017-11-19 DIAGNOSIS — G4089 Other seizures: Secondary | ICD-10-CM | POA: Diagnosis present

## 2017-11-19 DIAGNOSIS — E119 Type 2 diabetes mellitus without complications: Secondary | ICD-10-CM | POA: Diagnosis present

## 2017-11-19 DIAGNOSIS — I480 Paroxysmal atrial fibrillation: Secondary | ICD-10-CM | POA: Diagnosis present

## 2017-11-19 DIAGNOSIS — E872 Acidosis: Secondary | ICD-10-CM | POA: Diagnosis present

## 2017-11-19 DIAGNOSIS — J869 Pyothorax without fistula: Secondary | ICD-10-CM | POA: Diagnosis present

## 2017-11-19 DIAGNOSIS — E871 Hypo-osmolality and hyponatremia: Secondary | ICD-10-CM | POA: Diagnosis present

## 2017-11-19 DIAGNOSIS — I10 Essential (primary) hypertension: Secondary | ICD-10-CM | POA: Diagnosis present

## 2017-11-19 DIAGNOSIS — J449 Chronic obstructive pulmonary disease, unspecified: Secondary | ICD-10-CM | POA: Diagnosis present

## 2017-11-19 DIAGNOSIS — Z6834 Body mass index (BMI) 34.0-34.9, adult: Secondary | ICD-10-CM

## 2017-11-19 DIAGNOSIS — C3481 Malignant neoplasm of overlapping sites of right bronchus and lung: Secondary | ICD-10-CM

## 2017-11-19 DIAGNOSIS — K219 Gastro-esophageal reflux disease without esophagitis: Secondary | ICD-10-CM | POA: Diagnosis present

## 2017-11-19 DIAGNOSIS — J9690 Respiratory failure, unspecified, unspecified whether with hypoxia or hypercapnia: Secondary | ICD-10-CM

## 2017-11-19 DIAGNOSIS — R4182 Altered mental status, unspecified: Secondary | ICD-10-CM | POA: Diagnosis not present

## 2017-11-19 DIAGNOSIS — Z9689 Presence of other specified functional implants: Secondary | ICD-10-CM

## 2017-11-19 DIAGNOSIS — C7931 Secondary malignant neoplasm of brain: Secondary | ICD-10-CM

## 2017-11-19 DIAGNOSIS — J910 Malignant pleural effusion: Secondary | ICD-10-CM | POA: Diagnosis present

## 2017-11-19 DIAGNOSIS — R627 Adult failure to thrive: Secondary | ICD-10-CM

## 2017-11-19 DIAGNOSIS — C3490 Malignant neoplasm of unspecified part of unspecified bronchus or lung: Secondary | ICD-10-CM

## 2017-11-19 DIAGNOSIS — Z981 Arthrodesis status: Secondary | ICD-10-CM

## 2017-11-19 DIAGNOSIS — Z4682 Encounter for fitting and adjustment of non-vascular catheter: Secondary | ICD-10-CM

## 2017-11-19 DIAGNOSIS — E876 Hypokalemia: Secondary | ICD-10-CM | POA: Diagnosis present

## 2017-11-19 DIAGNOSIS — I4891 Unspecified atrial fibrillation: Secondary | ICD-10-CM

## 2017-11-19 LAB — CBG MONITORING, ED
GLUCOSE-CAPILLARY: 168 mg/dL — AB (ref 65–99)
Glucose-Capillary: 159 mg/dL — ABNORMAL HIGH (ref 65–99)

## 2017-11-19 LAB — I-STAT VENOUS BLOOD GAS, ED
ACID-BASE DEFICIT: 3 mmol/L — AB (ref 0.0–2.0)
Bicarbonate: 21.7 mmol/L (ref 20.0–28.0)
O2 Saturation: 88 %
PH VEN: 7.388 (ref 7.250–7.430)
TCO2: 23 mmol/L (ref 22–32)
pCO2, Ven: 36.1 mmHg — ABNORMAL LOW (ref 44.0–60.0)
pO2, Ven: 54 mmHg — ABNORMAL HIGH (ref 32.0–45.0)

## 2017-11-19 LAB — CBC WITH DIFFERENTIAL/PLATELET
BASOS ABS: 0.1 10*3/uL (ref 0.0–0.1)
Basophils Relative: 0 %
EOS ABS: 0.1 10*3/uL (ref 0.0–0.7)
EOS PCT: 1 %
HCT: 42 % (ref 36.0–46.0)
Hemoglobin: 13.8 g/dL (ref 12.0–15.0)
Lymphocytes Relative: 22 %
Lymphs Abs: 3.9 10*3/uL (ref 0.7–4.0)
MCH: 28.9 pg (ref 26.0–34.0)
MCHC: 32.9 g/dL (ref 30.0–36.0)
MCV: 87.9 fL (ref 78.0–100.0)
MONO ABS: 1.3 10*3/uL — AB (ref 0.1–1.0)
Monocytes Relative: 7 %
Neutro Abs: 12.8 10*3/uL — ABNORMAL HIGH (ref 1.7–7.7)
Neutrophils Relative %: 70 %
PLATELETS: 445 10*3/uL — AB (ref 150–400)
RBC: 4.78 MIL/uL (ref 3.87–5.11)
RDW: 13.9 % (ref 11.5–15.5)
WBC: 18.2 10*3/uL — AB (ref 4.0–10.5)

## 2017-11-19 LAB — URINALYSIS, COMPLETE (UACMP) WITH MICROSCOPIC
BILIRUBIN URINE: NEGATIVE
Bacteria, UA: NONE SEEN
GLUCOSE, UA: 50 mg/dL — AB
KETONES UR: NEGATIVE mg/dL
LEUKOCYTES UA: NEGATIVE
Nitrite: NEGATIVE
PH: 5 (ref 5.0–8.0)
Protein, ur: NEGATIVE mg/dL
SPECIFIC GRAVITY, URINE: 1.02 (ref 1.005–1.030)

## 2017-11-19 LAB — I-STAT CG4 LACTIC ACID, ED
Lactic Acid, Venous: 2.18 mmol/L (ref 0.5–1.9)
Lactic Acid, Venous: 2.28 mmol/L (ref 0.5–1.9)

## 2017-11-19 LAB — I-STAT TROPONIN, ED: Troponin i, poc: 0.12 ng/mL (ref 0.00–0.08)

## 2017-11-19 MED ORDER — ACETAMINOPHEN 650 MG RE SUPP
650.0000 mg | Freq: Once | RECTAL | Status: AC
  Administered 2017-11-19: 650 mg via RECTAL
  Filled 2017-11-19: qty 1

## 2017-11-19 MED ORDER — IOPAMIDOL (ISOVUE-370) INJECTION 76%
INTRAVENOUS | Status: AC
  Administered 2017-11-19: 100 mL
  Filled 2017-11-19: qty 100

## 2017-11-19 MED ORDER — SODIUM CHLORIDE 0.9 % IV SOLN
INTRAVENOUS | Status: DC
  Administered 2017-11-19 – 2017-11-20 (×2): via INTRAVENOUS

## 2017-11-19 MED ORDER — DILTIAZEM HCL-DEXTROSE 100-5 MG/100ML-% IV SOLN (PREMIX)
5.0000 mg/h | INTRAVENOUS | Status: DC
  Administered 2017-11-19: 5 mg/h via INTRAVENOUS
  Administered 2017-11-20: 10 mg/h via INTRAVENOUS
  Administered 2017-11-21: 15 mg/h via INTRAVENOUS
  Administered 2017-11-21 (×3): 10 mg/h via INTRAVENOUS
  Filled 2017-11-19 (×8): qty 100

## 2017-11-19 MED ORDER — PIPERACILLIN-TAZOBACTAM 3.375 G IVPB
3.3750 g | Freq: Three times a day (TID) | INTRAVENOUS | Status: DC
  Administered 2017-11-20 – 2017-11-22 (×7): 3.375 g via INTRAVENOUS
  Filled 2017-11-19 (×8): qty 50

## 2017-11-19 MED ORDER — DILTIAZEM LOAD VIA INFUSION
10.0000 mg | Freq: Once | INTRAVENOUS | Status: AC
  Administered 2017-11-19: 10 mg via INTRAVENOUS
  Filled 2017-11-19: qty 10

## 2017-11-19 MED ORDER — ACETAMINOPHEN 325 MG PO TABS
650.0000 mg | ORAL_TABLET | Freq: Once | ORAL | Status: DC
  Filled 2017-11-19: qty 2

## 2017-11-19 MED ORDER — PIPERACILLIN-TAZOBACTAM 3.375 G IVPB 30 MIN
3.3750 g | Freq: Once | INTRAVENOUS | Status: AC
  Administered 2017-11-19: 3.375 g via INTRAVENOUS
  Filled 2017-11-19: qty 50

## 2017-11-19 MED ORDER — SODIUM CHLORIDE 0.9 % IV BOLUS (SEPSIS)
1000.0000 mL | Freq: Once | INTRAVENOUS | Status: AC
  Administered 2017-11-19: 1000 mL via INTRAVENOUS

## 2017-11-19 MED ORDER — VANCOMYCIN HCL IN DEXTROSE 750-5 MG/150ML-% IV SOLN
750.0000 mg | Freq: Two times a day (BID) | INTRAVENOUS | Status: DC
  Administered 2017-11-19 – 2017-11-21 (×5): 750 mg via INTRAVENOUS
  Filled 2017-11-19 (×6): qty 150

## 2017-11-19 MED ORDER — DEXTROSE 5 % IV SOLN
500.0000 mg | Freq: Once | INTRAVENOUS | Status: DC
  Administered 2017-11-19: 500 mg via INTRAVENOUS
  Filled 2017-11-19: qty 500

## 2017-11-19 MED ORDER — DEXTROSE 5 % IV SOLN
1.0000 g | Freq: Once | INTRAVENOUS | Status: DC
  Administered 2017-11-19: 1 g via INTRAVENOUS
  Filled 2017-11-19: qty 10

## 2017-11-19 NOTE — ED Notes (Signed)
Family at bedside. 

## 2017-11-19 NOTE — ED Provider Notes (Signed)
[facility] EMERGENCY DEPARTMENT Provider Note   CSN: [id number] Arrival date & time: 11/19/17  1938     History   Chief Complaint Chief Complaint  Patient presents with  . Altered Mental Status    HPI [name] is a 71 y.o. female.  Level V caveat: AMS   [name] is a 71 y.o. Female who presents to the ED via EMS with altered mental status.  Per EMS the patient is coming from home.  Her family reports that the patient has been sick for the past 4 days and has been more sleepy and confused for three days.  She has had little to eat or drink and has had diminished appetite recently.  They report earlier today they found her sweating and her chair sitting.  Patient complains of pain in her chest to me.  She was recently diagnosed with non-small cell lung cancer with mets to her brain.  Caregivers are not at bedside.  Patient denies other complaints.    The history is provided by the patient, medical records and the EMS personnel. The history is limited by the absence of a caregiver. No language interpreter was used.  Altered Mental Status      Past Medical History:  Diagnosis Date  . Anxiety   . Arthritis   . Cancer ([place])   . Chronic back pain greater than 3 months duration   . Chronic neck pain   . COPD (chronic obstructive pulmonary disease) ([place])   . Cough   . Diabetes mellitus   . Dyspnea   . GERD (gastroesophageal reflux disease)   . H/O hiatal hernia   . Headache(784.0)   . Hemorrhoids   . Herniated disc   . History of kidney stones   . Hx of echocardiogram 2010   normal EF  . Hypertension   . Palpitations   . Shortness of breath on exertion 11/10/11   "sometimes"  . Vertigo   . Vertigo     Patient Active Problem List   Diagnosis Date Noted  . Cerebral edema (HCC)   . Muscle spasm   . Diabetes mellitus type 2 in obese ([place])   . Labile blood glucose   . Cough   . Hypoalbuminemia due to protein-calorie malnutrition ([place])   .  Leukocytosis   . Metastatic lung cancer (metastasis from lung to other site) ([place]) 10/11/2017  . Brain metastasis ([place])   . Seizure prophylaxis   . Hyperlipidemia   . Slow transit constipation   . Hyponatremia   . Stage 3 chronic kidney disease ([place])   . Type 2 diabetes mellitus with peripheral neuropathy (HCC)   . Benign essential HTN   . Tobacco abuse   . Focal seizure ([place]) 10/02/2017  . GERD (gastroesophageal reflux disease) 08/16/2017  . Lung mass 08/16/2017  . Shortness of breath 07/05/2017    Class: Acute  . Vaginal odor 02/10/2017  . Dysuria 02/10/2017  . Osteoarthritis of spine with radiculopathy, cervical region 12/25/2016  . COPD (chronic obstructive pulmonary disease) ([place]) 10/11/2015  . Orthostatic hypotension 06/18/2013  . Numbness and tingling in right hand 11/10/2011    Class: Acute  . Hypertension 11/10/2011  . Cervical disc disease 11/10/2011    Class: Chronic  . DM II (diabetes mellitus, type II), controlled ([place]) 11/10/2011    Class: Chronic    Past Surgical History:  Procedure Laterality Date  . ABDOMINAL HYSTERECTOMY  1997?   "partial"  . ANTERIOR CERVICAL DECOMP/DISCECTOMY  FUSION N/A 12/25/2016   Procedure: ANTERIOR CERVICAL DECOMPRESSION/DISCECTOMY FUSION CERVICAL FOUR CERVICAL FIVE;  Surgeon: Ashok Pall, MD;  Location: [facility];  Service: Neurosurgery;  Laterality: N/A;  . CHOLECYSTECTOMY  ~ 2008  . COLONOSCOPY    . EYE SURGERY     cateracts  . FRACTURE SURGERY  2004?   right shoulder  . IR THORACENTESIS ASP PLEURAL SPACE W/IMG GUIDE  11/10/2017  . KNEE ARTHROSCOPY  2004   right  . KNEE SURGERY     right  . neck fusion    . SHOULDER OPEN ROTATOR CUFF REPAIR  07/20/2011   left  . TONSILLECTOMY      OB History    No data available       Home Medications    Prior to Admission medications   Medication Sig Start Date End Date Taking? Authorizing Provider  acetaminophen (TYLENOL) 500 MG tablet Take 1 tablet (500 mg total) every 6 (six)  hours as needed by mouth for fever. 10/11/17   Dixie Dials, MD  albuterol (PROVENTIL) (2.5 MG/3ML) 0.083% nebulizer solution USE 1 VIAL EVERY 6 HOURS AS NEEDED FOR SHORTNESS OF BREATH 10/15/15   [provider]  albuterol (VENTOLIN HFA) 108 (90 Base) MCG/ACT inhaler Inhale 2 puffs into the lungs every 6 (six) hours as needed for wheezing or shortness of breath.    [provider]  ALPRAZolam Duanne Moron) 0.25 MG tablet Take 1 tablet (0.25 mg total) by mouth 3 (three) times daily as needed for anxiety. Patient not taking: Reported on 11/12/2017 10/22/17   Angiulli, Lavon Paganini, PA-C  cholecalciferol (VITAMIN D) 1000 units tablet Take 1,000 Units by mouth daily.    [provider]  citalopram (CELEXA) 10 MG tablet Take 0.5 tablets (5 mg total) by mouth daily. Patient not taking: Reported on 11/12/2017 10/22/17   Angiulli, Lavon Paganini, PA-C  diltiazem (CARDIZEM) 30 MG tablet Take 30 mg by mouth 3 (three) times daily. 11/03/17   [provider]  esomeprazole (NEXIUM) 40 MG capsule Take 1 capsule (40 mg total) by mouth daily as needed (acid reflux). 10/22/17   Angiulli, Lavon Paganini, PA-C  gabapentin (NEURONTIN) 100 MG capsule Take 1 capsule (100 mg total) by mouth daily as needed (for leg pain). Patient not taking: Reported on 11/12/2017 10/22/17   Angiulli, Lavon Paganini, PA-C  HYDROcodone-acetaminophen (NORCO/VICODIN) 5-325 MG tablet Take 1 tablet by mouth every 4 (four) hours as needed for moderate pain or severe pain (As needed for pain.). Patient not taking: Reported on 11/12/2017 10/22/17   Angiulli, Lavon Paganini, PA-C  HYDROmorphone (DILAUDID) 2 MG tablet Take 1 tablet (2 mg total) by mouth every 4 (four) hours as needed for severe pain. 11/18/17   Grace Isaac, MD  JANUMET 50-500 MG tablet Take 1 tablet by mouth 2 (two) times daily. 09/16/17   [provider]  levETIRAcetam (KEPPRA) 500 MG tablet Take 1 tablet (500 mg total) by mouth 2 (two) times daily. 10/22/17    Angiulli, Lavon Paganini, PA-C  loratadine (CLARITIN) 10 MG tablet Take 10 mg by mouth daily as needed for allergies.    [provider]  losartan (COZAAR) 25 MG tablet Take 1 tablet (25 mg total) by mouth daily. 10/22/17   Angiulli, Lavon Paganini, PA-C  meclizine (ANTIVERT) 25 MG tablet Take 1 tablet (25 mg total) by mouth 3 (three) times daily. Patient not taking: Reported on 11/12/2017 10/22/17   Angiulli, Lavon Paganini, PA-C  metoprolol succinate (TOPROL-XL) 25 MG 24 hr tablet Take 1  tablet (25 mg total) by mouth daily. Patient not taking: Reported on 11/12/2017 10/22/17   Angiulli, Lavon Paganini, PA-C  ondansetron (ZOFRAN ODT) 4 MG disintegrating tablet Take 1 tablet (4 mg total) by mouth every 8 (eight) hours as needed for nausea or vomiting. Patient not taking: Reported on 11/12/2017 02/27/17   Duffy Bruce, MD  [facility] VERIO test strip USE AS DIRECTED EVERY DAY 09/13/17   [provider]  polyethylene glycol (MIRALAX / GLYCOLAX) packet Take 17 g daily as needed by mouth for moderate constipation. 10/11/17   Dixie Dials, MD  potassium chloride (KLOR-CON M10) 10 MEQ tablet Take 1 tablet (10 mEq total) by mouth daily. 10/22/17   Angiulli, Lavon Paganini, PA-C  rosuvastatin (CRESTOR) 10 MG tablet Take 1 tablet (10 mg total) by mouth daily. 10/22/17   Angiulli, Lavon Paganini, PA-C  traMADol (ULTRAM) 50 MG tablet Take 50 mg by mouth every 12 (twelve) hours as needed. 11/01/17   [provider]  traZODone (DESYREL) 50 MG tablet Take 1 tablet (50 mg total) by mouth at bedtime. Patient not taking: Reported on 11/12/2017 10/22/17   Angiulli, Lavon Paganini, PA-C  umeclidinium bromide (INCRUSE ELLIPTA) 62.5 MCG/INH AEPB Inhale 1 puff into the lungs daily. 10/22/17   AngiulliLavon Paganini, PA-C    Family History Family History  Problem Relation Age of Onset  . Lung cancer Sister     Social History Social History   Tobacco Use  . Smoking status: Former Smoker    Packs/day: 0.25    Years: 30.00     Pack years: 7.50    Types: Cigarettes    Last attempt to quit: 07/08/2017    Years since quitting: 0.3  . Smokeless tobacco: Never Used  . Tobacco comment: 4-5 cigs / day  Substance Use Topics  . Alcohol use: No  . Drug use: No     Allergies   Oxycodone   Review of Systems Review of Systems  Unable to perform ROS: Mental status change     Physical Exam Updated Vital Signs BP 95/68   Pulse (!) 142   Temp (!) 100.4 F (38 C) (Rectal)   Resp (!) 21   SpO2 91%   Physical Exam  Constitutional: She appears well-developed and well-nourished. No distress.  Non-toxic appearing.   HENT:  Head: Normocephalic and atraumatic.  Mouth/Throat: Oropharynx is clear and moist.  Eyes: Conjunctivae are normal. Pupils are equal, round, and reactive to light. Right eye exhibits no discharge. Left eye exhibits no discharge.  Neck: Neck supple.  Cardiovascular: Normal rate, regular rhythm, normal heart sounds and intact distal pulses. Exam reveals no gallop and no friction rub.  No murmur heard. Pulmonary/Chest: Effort normal. No respiratory distress.  Diminished to absent lung sounds on right.  Symmetric chest expansion bilaterally.  No increased work of breathing.  Oxygen saturation is 95% on room air.  Abdominal: Soft. She exhibits no distension. There is no tenderness. There is no guarding.  Musculoskeletal: She exhibits no tenderness.  Trace ankle edema bilaterally.   Lymphadenopathy:    She has no cervical adenopathy.  Neurological: She is alert. Coordination normal.  Patient is alert and oriented to person and place only.  She has some difficulty with recent events leading up to her emergency department visit.  Speech is clear and coherent.  She takes a long time to respond to questions.  She does follow commands.  Good and equal grip strengths bilaterally.  No facial droop.  No evidence of any  focal weakness.  Skin: Skin is warm and dry. Capillary refill takes less than 2 seconds. No  rash noted. She is not diaphoretic. No erythema. No pallor.  Psychiatric: She has a normal mood and affect. Her behavior is normal.  Nursing note and vitals reviewed.    ED Treatments / Results  Labs (all labs ordered are listed, but only abnormal results are displayed) Labs Reviewed  COMPREHENSIVE METABOLIC PANEL - Abnormal; Notable for the following components:      Result Value   Sodium 133 (*)    Glucose, Bld 180 (*)    BUN 23 (*)    Creatinine, Ser 1.15 (*)    Calcium >15.0 (*)    Total Protein 6.4 (*)    Albumin 2.5 (*)    ALT 13 (*)    Alkaline Phosphatase 151 (*)    GFR calc non Af Amer 47 (*)    GFR calc Af Amer 55 (*)    All other components within normal limits  CBC WITH DIFFERENTIAL/PLATELET - Abnormal; Notable for the following components:   WBC 18.2 (*)    Platelets 445 (*)    Neutro Abs 12.8 (*)    Monocytes Absolute 1.3 (*)    All other components within normal limits  URINALYSIS, COMPLETE (UACMP) WITH MICROSCOPIC - Abnormal; Notable for the following components:   Glucose, UA 50 (*)    Hgb urine dipstick SMALL (*)    Squamous Epithelial / LPF 0-5 (*)    All other components within normal limits  CBG MONITORING, ED - Abnormal; Notable for the following components:   Glucose-Capillary 159 (*)    All other components within normal limits  I-STAT TROPONIN, ED - Abnormal; Notable for the following components:   Troponin i, poc 0.12 (*)    All other components within normal limits  I-STAT CG4 LACTIC ACID, ED - Abnormal; Notable for the following components:   Lactic Acid, Venous 2.18 (*)    All other components within normal limits  I-STAT CG4 LACTIC ACID, ED - Abnormal; Notable for the following components:   Lactic Acid, Venous 2.28 (*)    All other components within normal limits  CBG MONITORING, ED - Abnormal; Notable for the following components:   Glucose-Capillary 168 (*)    All other components within normal limits  I-STAT VENOUS BLOOD GAS, ED -  Abnormal; Notable for the following components:   pCO2, Ven 36.1 (*)    pO2, Ven 54.0 (*)    Acid-base deficit 3.0 (*)    All other components within normal limits  URINE CULTURE  CULTURE, BLOOD (ROUTINE X 2)  CULTURE, BLOOD (ROUTINE X 2)  CALCIUM, IONIZED  BASIC METABOLIC PANEL  I-STAT CG4 LACTIC ACID, ED    EKG  EKG Interpretation  Date/Time:  Friday November 19 2017 19:43:33 EST Ventricular Rate:  88 PR Interval:    QRS Duration: 78 QT Interval:  361 QTC Calculation: 437 R Axis:   -14 Text Interpretation:  Sinus rhythm Anteroseptal infarct, age indeterminate No significant change since last tracing Confirmed by Duffy Bruce [phone]) on 11/19/2017 8:36:45 PM       Radiology Dg Chest 2 View  Result Date: 11/19/2017 CLINICAL DATA:  Lethargic EXAM: CHEST  2 VIEW COMPARISON:  11/10/2017, 11/09/2017, 07/05/2017, 11/01/2017 FINDINGS: Re- demonstrated right upper lobe lung mass. Increasing pleural effusion in the right thorax with worsening consolidation at the right middle lobe and lung base. Cardiomediastinal silhouette partially obscured. Aortic atherosclerosis. Left lung is grossly clear. IMPRESSION:  1. Worsening right pleural effusion and airspace consolidation at the right middle lobe and right lung base. 2. Re- demonstrated right upper lobe lung mass. Electronically Signed   By: Donavan Foil M.D.   On: 11/19/2017 20:47   Ct Head Wo Contrast  Result Date: 11/19/2017 CLINICAL DATA:  Acute onset of lethargy and diaphoresis. Altered level of consciousness. EXAM: CT HEAD WITHOUT CONTRAST TECHNIQUE: Contiguous axial images were obtained from the base of the skull through the vertex without intravenous contrast. COMPARISON:  CT of the head performed 10/04/2017 FINDINGS: Brain: The patient's known metastatic lesion at the left superior frontal lobe has decreased slightly in size, measuring approximately 9 mm, with decreased surrounding vasogenic edema. There is no evidence of acute  hemorrhage at this time. No evidence of acute infarction, hydrocephalus or extra-axial collection. The brainstem and fourth ventricle are within normal limits. The basal ganglia are unremarkable in appearance. No midline shift is seen. Vascular: No hyperdense vessel or unexpected calcification. Skull: There is no evidence of fracture; visualized osseous structures are unremarkable in appearance. Sinuses/Orbits: The orbits are within normal limits. The paranasal sinuses and mastoid air cells are well-aerated. Other: No significant soft tissue abnormalities are seen. IMPRESSION: Known metastatic lesion at the left superior frontal lobe has decreased slightly in size, measuring approximately 9 mm, with decreased surrounding vasogenic edema. No evidence of acute hemorrhage at this time. Electronically Signed   By: Garald Balding M.D.   On: 11/19/2017 23:32   Ct Angio Chest Pe W/cm &/or Wo Cm  Result Date: 11/19/2017 CLINICAL DATA:  Acute onset of diaphoresis, loss of appetite and right flank soreness. EXAM: CT ANGIOGRAPHY CHEST WITH CONTRAST TECHNIQUE: Multidetector CT imaging of the chest was performed using the standard protocol during bolus administration of intravenous contrast. Multiplanar CT image reconstructions and MIPs were obtained to evaluate the vascular anatomy. CONTRAST:  67mL ISOVUE-370 IOPAMIDOL (ISOVUE-370) INJECTION 76% COMPARISON:  CTA of the chest performed 11/01/2017 FINDINGS: Cardiovascular:  There is no evidence of pulmonary embolus. The heart is normal in size. Scattered coronary artery calcifications are seen. Scattered calcification is noted along the aortic arch and proximal great vessels. Mediastinum/Nodes: There is irregular nodularity along the anterior pericardium, new from the recent prior study. Given the appearance of the right-sided pleural space, this is concerning for either metastatic disease or a complex malignant effusion. Large loculated effusions abut the right side of the  mediastinum and fill the azygoesophageal recess, with a soft tissue rind, concerning for metastatic disease. An enlarged 1.5 cm right paratracheal node is concerning for metastatic disease. The visualized portions of the thyroid gland are unremarkable. No axillary lymphadenopathy is seen. Lungs/Pleura: There appears to have been very rapid progression of metastatic disease along the pleural space at the right hemithorax. Diffuse irregular soft tissue thickening is noted along the entirety of the right-sided pleura, mostly new from the recent prior study, reflecting the patient's underlying malignancy. The cavitary lesion at the right lung apex has increased in size to 4.1 cm. A significantly enlarging loculated moderate to large right-sided pleural effusion is noted, with peripheral soft tissue rind, concerning for malignant effusion. There is underlying compressive atelectasis. Given the patient's signs of infection, empyema cannot be excluded. The left lung appears clear.  No pneumothorax is seen. Upper Abdomen: The visualized portions of the liver and spleen are unremarkable. The patient is status post cholecystectomy, with clips noted at the gallbladder fossa. The visualized portions of the pancreas and adrenal glands are within normal limits. Musculoskeletal:  No acute osseous abnormalities are identified. The visualized musculature is unremarkable in appearance. Review of the MIP images confirms the above findings. IMPRESSION: 1. No evidence of pulmonary embolus. 2. Very rapid progression of metastatic disease along the pleural space at the right hemithorax. Diffuse irregular soft tissue thickening along the entirety of the right-sided pleura, mostly new from the recent prior study, reflecting the patient's underlying malignancy. 3. Significantly enlarging loculated moderate to large right-sided pleural effusion noted, with peripheral soft tissue rind, concerning for malignant effusion. Underlying compressive  atelectasis. Given the patient's signs of infection, empyema cannot be excluded. 4. Associated soft tissue components along the periphery of the right-sided pleural effusion abut the mediastinum, concerning for metastatic disease. Irregular nodularity along the anterior pericardium is new from the recent prior study and is also concerning for metastatic disease, though a complex malignant effusion might have a similar appearance. 5. 4.1 cm cavitary lesion at the right lung apex has increased mildly in size, reflecting the patient's malignancy. 6. Enlarged 1.5 cm right paratracheal node, concerning for metastatic disease. 7. Scattered coronary artery calcifications seen. These results were called by telephone at the time of interpretation on 11/19/2017 at 11:45 pm to [facility], who verbally acknowledged these results. Electronically Signed   By: Garald Balding M.D.   On: 11/19/2017 23:52    Procedures Procedures (including critical care time)  CRITICAL CARE Performed by: Hanley Hays   Total critical care time: 55 minutes  Critical care time was exclusive of separately billable procedures and treating other patients.  Critical care was necessary to treat or prevent imminent or life-threatening deterioration.  Critical care was time spent personally by me on the following activities: development of treatment plan with patient and/or surrogate as well as nursing, discussions with consultants, evaluation of patient's response to treatment, examination of patient, obtaining history from patient or surrogate, ordering and performing treatments and interventions, ordering and review of laboratory studies, ordering and review of radiographic studies, pulse oximetry and re-evaluation of patient's condition.   Medications Ordered in ED Medications  0.9 %  sodium chloride infusion ( Intravenous Stopped 11/19/17 2223)  diltiazem (CARDIZEM) 1 mg/mL load via infusion 10 mg (10 mg Intravenous  Bolus from Bag 11/19/17 2128)    And  diltiazem (CARDIZEM) 100 mg in dextrose 5% 168mL (1 mg/mL) infusion (10 mg/hr Intravenous Rate/Dose Change 11/19/17 2223)  piperacillin-tazobactam (ZOSYN) IVPB 3.375 g (not administered)  vancomycin (VANCOCIN) IVPB 750 mg/150 ml premix (0 mg Intravenous Stopped 11/20/17 0008)  sodium chloride 0.9 % bolus 1,000 mL (0 mLs Intravenous Stopped 11/19/17 2359)  sodium chloride 0.9 % bolus 1,000 mL (0 mLs Intravenous Stopped 11/20/17 0000)  piperacillin-tazobactam (ZOSYN) IVPB 3.375 g (0 g Intravenous Stopped 11/19/17 2223)  iopamidol (ISOVUE-370) 76 % injection (100 mLs  Contrast Given 11/19/17 2250)  sodium chloride 0.9 % bolus 1,000 mL (0 mLs Intravenous Stopped 11/20/17 0008)  acetaminophen (TYLENOL) suppository 650 mg (650 mg Rectal Given 11/19/17 2224)     Initial Impression / Assessment and Plan / ED Course  I have reviewed the triage vital signs and the nursing notes.  Pertinent labs & imaging results that were available during my care of the patient were reviewed by me and considered in my medical decision making (see chart for details).    This is a 71 y.o. Female who presents to the ED via EMS with altered mental status.  Per EMS the patient is coming from home.  Her family reports that the patient has  been sick for the past 4 days and has been more sleepy and confused for three days.  She has had little to eat or drink and has had diminished appetite recently.  They report earlier today they found her sweating and her chair sitting.  Patient complains of pain in her chest to me.  She was recently diagnosed with non-small cell lung cancer with mets to her brain.  On exam the patient is somewhat confused.  She is slow to answer questions.  Rectal temperature is 100.4.  She is not tachycardic or hypotensive.  No evidence of A. fib on the monitor.  EKG without evidence of A. Fib.  She has diminished to absent lung sounds on her right.  No increased work of  breathing.  Oxygen saturation around 93% on room air.  Code sepsis activated. Antibiotics ordered.  Rocephin and azithromycin and then Zosyn added due to possible obstructive pneumonia.  Lactic acid elevated at 2.18.  White count of 18,000.  At reevaluation after patient left for chest x-ray patient's heart rate is elevated into the 140s-160s.  She is in A. fib on the monitor.  No known history of A. fib.  She has good blood pressure at this time and will start Cardizem to help with rate control.  Additional fluids ordered.  Chest x-ray shows a worsening right pleural effusion and airspace consolidation of the right middle lobe and right lung base.  Redemonstrated right upper lobe lung mass.  Antibiotics running.  Fluids running.  CMP reveals hypercalcemia with a calcium greater than 15.  This might help explain the patient's altered mental status.  I called and consulted with critical care medicine physician Dr. Jimmy Footman. She recommends continuing fluids at this time and recheck calcium before additional methods are attempted for lowering calcium at this time.  Awaiting CT scan.  Patient still tachycardic but improving.  Blood pressure is soft in the 66Q systolic.  Patient mentating better at reevaluation.  CT head without contrast shows no metastatic lesion of the left superior frontal lobe that is slightly decreased in size.  No evidence of acute hemorrhage at this time.  CT angiogram of her chest was obtained to rule out PE and as well as to better evaluate her lung mass.  She has no evidence of PE.  This shows very rapid progression of the metastatic disease along the pleural space at the right hemothorax.  Also a large pleural effusion that is concerning for empyema.   She is mentating better.  Still tachycardic with a heart rate of 130.  Pressures around 90 systolic.  Patient is very sick.  I discussed plan with family for admission they agree. Still has pending repeat calcium.   I  consulted with critical care medicine physician Dr. Jimmy Footman again. She will have CCM see in consult but would like [place] to have medicine admit.   Patient is with Dr. Doylene Canard. I called and consulted with Dr. Doylene Canard who reports he will be in to see the patient now for admission.   This patient was discussed with and evaluated by Dr. Myrene Buddy who agrees with assessment and plan.    Final Clinical Impressions(s) / ED Diagnoses   Final diagnoses:  Sepsis, due to unspecified organism [facility])  Atrial fibrillation with RVR ([place])  Hypercalcemia  Malignant neoplasm of overlapping sites of right lung [facility])  Pleural effusion    ED Discharge Orders    None       Waynetta Pean, PA-C 11/20/17 0100  Duffy Bruce, MD 11/20/17 [phone]

## 2017-11-19 NOTE — ED Triage Notes (Signed)
Per EMS, pt from home. Family reports pt was sick on Tuesday and has been acting lethargic since Wednesday. Pt has had no appetite and was found diaphoretic sitting in chair. Pt c/o soreness to rt flank. Neuro intact, pt ambulatory on scene. Recent diagnosis of a chest cancer, pt c/o cp. Hx of diabetes, CBG 189. Pt A&O x3, slow to respond.

## 2017-11-19 NOTE — ED Notes (Signed)
ED Provider at bedside. 

## 2017-11-19 NOTE — Progress Notes (Addendum)
ED CM noted patient to have had 8 ED visits with 4 resulting in hospitalization in the past 6 months.  Patient has a history of lung ca with mets recent diagnosis and is followed at the [facility] and is receiving radiation therapy. She presents tonight with AMS r/o pneumonia family. Patient being hospitalized to start on IV Zosyn. ED CM met with patient's daughter [name] [phone] at bedside she reports that patient was in [place] last month and discharged home with home health she was unable to provide the exact name of the [facility] agency at the time. As per daughter patient lives at home with husband and  has a lot of family support. CM and  consult placed to assist with  transitional care planning once patient is medically cleared for discharge.

## 2017-11-19 NOTE — ED Notes (Signed)
Patient transported to CT 

## 2017-11-19 NOTE — ED Notes (Signed)
EDP updating family at this time

## 2017-11-19 NOTE — Progress Notes (Signed)
Pharmacy Antibiotic Note  [name] is a 71 y.o. female admitted on 11/19/2017 with pneumonia.    Plan: Zosyn 3.375 gm iv q8h Vanc 750 q12h Monitor renal fx cx vt prn     Temp (24hrs), Avg:99.3 F (37.4 C), Min:98.2 F (36.8 C), Max:100.4 F (38 C)  Recent Labs  Lab 11/19/17 1950 11/19/17 2016  WBC 18.2*  --   CREATININE 1.15*  --   LATICACIDVEN  --  2.18*    Estimated Creatinine Clearance: 55.1 mL/min (A) (by C-G formula based on SCr of 1.15 mg/dL (H)).    Allergies  Allergen Reactions  . Oxycodone Hives and Itching   Levester Fresh, PharmD, BCPS, BCCCP Clinical Pharmacist Clinical phone for 11/19/2017 from [phone]: [phone] If after 2300, please call main pharmacy at: [phone] 11/19/2017 9:09 PM

## 2017-11-20 ENCOUNTER — Other Ambulatory Visit: Payer: Self-pay

## 2017-11-20 DIAGNOSIS — Z66 Do not resuscitate: Secondary | ICD-10-CM | POA: Diagnosis present

## 2017-11-20 DIAGNOSIS — R0600 Dyspnea, unspecified: Secondary | ICD-10-CM | POA: Diagnosis not present

## 2017-11-20 DIAGNOSIS — J9601 Acute respiratory failure with hypoxia: Secondary | ICD-10-CM | POA: Diagnosis present

## 2017-11-20 DIAGNOSIS — J90 Pleural effusion, not elsewhere classified: Secondary | ICD-10-CM | POA: Diagnosis not present

## 2017-11-20 DIAGNOSIS — J910 Malignant pleural effusion: Secondary | ICD-10-CM | POA: Diagnosis present

## 2017-11-20 DIAGNOSIS — C3481 Malignant neoplasm of overlapping sites of right bronchus and lung: Secondary | ICD-10-CM | POA: Diagnosis not present

## 2017-11-20 DIAGNOSIS — G4089 Other seizures: Secondary | ICD-10-CM | POA: Diagnosis present

## 2017-11-20 DIAGNOSIS — C3490 Malignant neoplasm of unspecified part of unspecified bronchus or lung: Secondary | ICD-10-CM | POA: Diagnosis not present

## 2017-11-20 DIAGNOSIS — N179 Acute kidney failure, unspecified: Secondary | ICD-10-CM | POA: Diagnosis not present

## 2017-11-20 DIAGNOSIS — E669 Obesity, unspecified: Secondary | ICD-10-CM | POA: Diagnosis present

## 2017-11-20 DIAGNOSIS — J869 Pyothorax without fistula: Secondary | ICD-10-CM | POA: Diagnosis present

## 2017-11-20 DIAGNOSIS — A419 Sepsis, unspecified organism: Secondary | ICD-10-CM | POA: Diagnosis not present

## 2017-11-20 DIAGNOSIS — Z801 Family history of malignant neoplasm of trachea, bronchus and lung: Secondary | ICD-10-CM | POA: Diagnosis not present

## 2017-11-20 DIAGNOSIS — C3411 Malignant neoplasm of upper lobe, right bronchus or lung: Secondary | ICD-10-CM | POA: Diagnosis not present

## 2017-11-20 DIAGNOSIS — Z923 Personal history of irradiation: Secondary | ICD-10-CM | POA: Diagnosis not present

## 2017-11-20 DIAGNOSIS — K219 Gastro-esophageal reflux disease without esophagitis: Secondary | ICD-10-CM | POA: Diagnosis present

## 2017-11-20 DIAGNOSIS — Z981 Arthrodesis status: Secondary | ICD-10-CM | POA: Diagnosis not present

## 2017-11-20 DIAGNOSIS — Z23 Encounter for immunization: Secondary | ICD-10-CM | POA: Diagnosis present

## 2017-11-20 DIAGNOSIS — Z6834 Body mass index (BMI) 34.0-34.9, adult: Secondary | ICD-10-CM | POA: Diagnosis not present

## 2017-11-20 DIAGNOSIS — E871 Hypo-osmolality and hyponatremia: Secondary | ICD-10-CM | POA: Diagnosis present

## 2017-11-20 DIAGNOSIS — Z9049 Acquired absence of other specified parts of digestive tract: Secondary | ICD-10-CM | POA: Diagnosis not present

## 2017-11-20 DIAGNOSIS — E119 Type 2 diabetes mellitus without complications: Secondary | ICD-10-CM | POA: Diagnosis present

## 2017-11-20 DIAGNOSIS — E872 Acidosis: Secondary | ICD-10-CM | POA: Diagnosis present

## 2017-11-20 DIAGNOSIS — Z87891 Personal history of nicotine dependence: Secondary | ICD-10-CM | POA: Diagnosis not present

## 2017-11-20 DIAGNOSIS — C7931 Secondary malignant neoplasm of brain: Secondary | ICD-10-CM | POA: Diagnosis present

## 2017-11-20 DIAGNOSIS — R4182 Altered mental status, unspecified: Secondary | ICD-10-CM | POA: Diagnosis present

## 2017-11-20 DIAGNOSIS — E876 Hypokalemia: Secondary | ICD-10-CM | POA: Diagnosis present

## 2017-11-20 DIAGNOSIS — R627 Adult failure to thrive: Secondary | ICD-10-CM | POA: Diagnosis not present

## 2017-11-20 DIAGNOSIS — Z515 Encounter for palliative care: Secondary | ICD-10-CM | POA: Diagnosis not present

## 2017-11-20 DIAGNOSIS — Z8673 Personal history of transient ischemic attack (TIA), and cerebral infarction without residual deficits: Secondary | ICD-10-CM | POA: Diagnosis not present

## 2017-11-20 DIAGNOSIS — I10 Essential (primary) hypertension: Secondary | ICD-10-CM | POA: Diagnosis present

## 2017-11-20 LAB — COMPREHENSIVE METABOLIC PANEL
ALK PHOS: 151 U/L — AB (ref 38–126)
ALT: 13 U/L — AB (ref 14–54)
AST: 18 U/L (ref 15–41)
Albumin: 2.5 g/dL — ABNORMAL LOW (ref 3.5–5.0)
Anion gap: 8 (ref 5–15)
BILIRUBIN TOTAL: 0.8 mg/dL (ref 0.3–1.2)
BUN: 23 mg/dL — AB (ref 6–20)
CO2: 23 mmol/L (ref 22–32)
CREATININE: 1.15 mg/dL — AB (ref 0.44–1.00)
Chloride: 102 mmol/L (ref 101–111)
GFR calc Af Amer: 55 mL/min — ABNORMAL LOW (ref >60)
GFR, EST NON AFRICAN AMERICAN: 47 mL/min — AB (ref >60)
GLUCOSE: 180 mg/dL — AB (ref 65–99)
POTASSIUM: 4.1 mmol/L (ref 3.5–5.1)
Sodium: 133 mmol/L — ABNORMAL LOW (ref 135–145)
TOTAL PROTEIN: 6.4 g/dL — AB (ref 6.5–8.1)

## 2017-11-20 LAB — CBC
HCT: 39.5 % (ref 36.0–46.0)
Hemoglobin: 12.8 g/dL (ref 12.0–15.0)
MCH: 28.5 pg (ref 26.0–34.0)
MCHC: 32.4 g/dL (ref 30.0–36.0)
MCV: 88 fL (ref 78.0–100.0)
PLATELETS: 432 10*3/uL — AB (ref 150–400)
RBC: 4.49 MIL/uL (ref 3.87–5.11)
RDW: 14 % (ref 11.5–15.5)
WBC: 15.9 10*3/uL — ABNORMAL HIGH (ref 4.0–10.5)

## 2017-11-20 LAB — BASIC METABOLIC PANEL
Anion gap: 8 (ref 5–15)
BUN: 18 mg/dL (ref 6–20)
CALCIUM: 13.9 mg/dL — AB (ref 8.9–10.3)
CHLORIDE: 106 mmol/L (ref 101–111)
CO2: 21 mmol/L — ABNORMAL LOW (ref 22–32)
CREATININE: 0.98 mg/dL (ref 0.44–1.00)
GFR calc Af Amer: >60 mL/min (ref >60)
GFR, EST NON AFRICAN AMERICAN: 57 mL/min — AB (ref >60)
Glucose, Bld: 186 mg/dL — ABNORMAL HIGH (ref 65–99)
Potassium: 3.9 mmol/L (ref 3.5–5.1)
SODIUM: 135 mmol/L (ref 135–145)

## 2017-11-20 LAB — LACTIC ACID, PLASMA: LACTIC ACID, VENOUS: 3.4 mmol/L — AB (ref 0.5–1.9)

## 2017-11-20 MED ORDER — POLYETHYLENE GLYCOL 3350 17 G PO PACK: 17.0000 g | PACK | Freq: Every day | ORAL | Status: DC | PRN

## 2017-11-20 MED ORDER — MECLIZINE HCL 25 MG PO TABS
25.0000 mg | ORAL_TABLET | Freq: Three times a day (TID) | ORAL | Status: DC
  Administered 2017-11-20 (×3): 25 mg via ORAL
  Filled 2017-11-20 (×3): qty 1

## 2017-11-20 MED ORDER — AMIODARONE HCL 200 MG PO TABS
400.0000 mg | ORAL_TABLET | Freq: Two times a day (BID) | ORAL | Status: DC
  Administered 2017-11-20 – 2017-11-21 (×3): 400 mg via ORAL
  Filled 2017-11-20 (×3): qty 2

## 2017-11-20 MED ORDER — LOSARTAN POTASSIUM 25 MG PO TABS
25.0000 mg | ORAL_TABLET | Freq: Every day | ORAL | Status: DC
  Administered 2017-11-20: 25 mg via ORAL
  Filled 2017-11-20: qty 1

## 2017-11-20 MED ORDER — TRAMADOL HCL 50 MG PO TABS
50.0000 mg | ORAL_TABLET | Freq: Four times a day (QID) | ORAL | Status: DC | PRN
  Administered 2017-11-20 – 2017-11-22 (×4): 50 mg via ORAL
  Filled 2017-11-20 (×4): qty 1

## 2017-11-20 MED ORDER — VITAMIN D 1000 UNITS PO TABS
1000.0000 [IU] | ORAL_TABLET | Freq: Every day | ORAL | Status: DC
  Administered 2017-11-20: 1000 [IU] via ORAL
  Filled 2017-11-20: qty 1

## 2017-11-20 MED ORDER — ALBUTEROL SULFATE (2.5 MG/3ML) 0.083% IN NEBU
2.5000 mg | INHALATION_SOLUTION | Freq: Four times a day (QID) | RESPIRATORY_TRACT | Status: DC
  Administered 2017-11-20 – 2017-11-22 (×10): 2.5 mg via RESPIRATORY_TRACT
  Filled 2017-11-20 (×10): qty 3

## 2017-11-20 MED ORDER — ALBUTEROL SULFATE (2.5 MG/3ML) 0.083% IN NEBU
3.0000 mL | INHALATION_SOLUTION | Freq: Four times a day (QID) | RESPIRATORY_TRACT | Status: DC | PRN
  Administered 2017-11-23 – 2017-11-26 (×3): 3 mL via RESPIRATORY_TRACT
  Filled 2017-11-20 (×3): qty 3

## 2017-11-20 MED ORDER — ZOLEDRONIC ACID 4 MG/5ML IV CONC: 3.5000 mg | Freq: Once | INTRAVENOUS | Status: DC

## 2017-11-20 MED ORDER — LEVETIRACETAM 500 MG PO TABS
500.0000 mg | ORAL_TABLET | Freq: Two times a day (BID) | ORAL | Status: DC
  Administered 2017-11-20 (×2): 500 mg via ORAL
  Filled 2017-11-20 (×2): qty 1

## 2017-11-20 MED ORDER — SODIUM CHLORIDE 0.9 % IV SOLN
3.5000 mg | Freq: Once | INTRAVENOUS | Status: AC
  Administered 2017-11-20: 3.5 mg via INTRAVENOUS
  Filled 2017-11-20: qty 4.38

## 2017-11-20 MED ORDER — LORATADINE 10 MG PO TABS: 10.0000 mg | ORAL_TABLET | Freq: Every day | ORAL | Status: DC | PRN

## 2017-11-20 MED ORDER — FUROSEMIDE 10 MG/ML IJ SOLN
40.0000 mg | Freq: Two times a day (BID) | INTRAMUSCULAR | Status: DC
  Administered 2017-11-20 (×2): 40 mg via INTRAVENOUS
  Filled 2017-11-20 (×2): qty 4

## 2017-11-20 MED ORDER — ACETAMINOPHEN 500 MG PO TABS
500.0000 mg | ORAL_TABLET | Freq: Four times a day (QID) | ORAL | Status: DC | PRN
  Administered 2017-11-20 – 2017-11-27 (×2): 500 mg via ORAL
  Filled 2017-11-20 (×2): qty 1

## 2017-11-20 MED ORDER — HEPARIN SODIUM (PORCINE) 5000 UNIT/ML IJ SOLN
5000.0000 [IU] | Freq: Three times a day (TID) | INTRAMUSCULAR | Status: DC
  Administered 2017-11-20 – 2017-12-01 (×33): 5000 [IU] via SUBCUTANEOUS
  Filled 2017-11-20 (×33): qty 1

## 2017-11-20 NOTE — ED Notes (Signed)
Pt's family members have approached the desk a copious amount of times to report the pt having pain.  Despite going directly into the room as family members came to the desk pt would be sleeping when this writer arrived in the room.  Pt denied pain to this Probation officer.  Suggested to family that they wait in the lobby and let the pt sleep as a decrease in stimulation would be beneficial to the pt.  While family members went to lobby pt rested quietly.  Upon family members returning to room, one family member became verbally aggressive, balled her hands into fist and began shaking her head back and fourth at this Probation officer.  At that time I asked that the family member leave the room as her behavior was not appropriate.

## 2017-11-20 NOTE — Discharge Summary (Signed)
Clear Liquid diet breakfast tray ordered @ 0401.

## 2017-11-20 NOTE — Progress Notes (Signed)
Ref: Dixie Dials, MD   Subjective:  Less confused per family. Asking for pain medications.  Objective:  Vital Signs in the last 24 hours: Temp:  [98.2 F (36.8 C)-100.4 F (38 C)] 100.4 F (38 C) (12/28 2011) Pulse Rate:  [45-163] 126 (12/29 0815) Cardiac Rhythm: Atrial fibrillation (12/28 2030) Resp:  [16-35] 21 (12/29 0815) BP: (89-139)/(59-99) 132/88 (12/29 0815) SpO2:  [89 %-96 %] 94 % (12/29 0900)  Physical Exam: BP Readings from Last 1 Encounters:  11/20/17 132/88     Wt Readings from Last 1 Encounters:  11/12/17 99.4 kg (219 lb 1.6 oz)    Weight change:  There is no height or weight on file to calculate BMI. HEENT: [place]/AT, Eyes-Brown, PERL, EOMI, Conjunctiva-Pink, Sclera-Non-icteric Neck: No JVD, No bruit, Trachea midline. Lungs:  Dullness over right lower lung. Anteriorly Clear, Bilateral. Cardiac:  Regular rhythm, normal S1 and S2, no S3. II/VI systolic murmur. Abdomen:  Soft, non-tender. BS present. Extremities:  No edema present. No cyanosis. No clubbing. CNS: AxOx2, Cranial nerves grossly intact, moves all 4 extremities.  Skin: Warm and dry.   Intake/Output from previous day: 12/28 0701 - 12/29 0700 In: 4349 [I.V.:999; IV Piggyback:3350] Out: -     Lab Results: BMET    Component Value Date/Time   NA 135 11/20/2017 0456   NA 133 (L) 11/19/2017 1950   NA 133 (L) 11/01/2017 1105   NA 139 10/28/2017 1242   NA 131 (L) 09/28/2017 1355   K 3.9 11/20/2017 0456   K 4.1 11/19/2017 1950   K 3.8 11/01/2017 1105   K 3.8 10/28/2017 1242   K 4.5 09/28/2017 1355   CL 106 11/20/2017 0456   CL 102 11/19/2017 1950   CL 105 11/01/2017 1105   CO2 21 (L) 11/20/2017 0456   CO2 23 11/19/2017 1950   CO2 19 (L) 11/01/2017 1105   CO2 24 10/28/2017 1242   CO2 23 09/28/2017 1355   GLUCOSE 186 (H) 11/20/2017 0456   GLUCOSE 180 (H) 11/19/2017 1950   GLUCOSE 161 (H) 11/01/2017 1105   GLUCOSE 164 (H) 10/28/2017 1242   GLUCOSE 248 (H) 09/28/2017 1355   BUN 18  11/20/2017 0456   BUN 23 (H) 11/19/2017 1950   BUN 11 11/01/2017 1105   BUN 15.4 10/28/2017 1242   BUN 25.7 09/28/2017 1355   CREATININE 0.98 11/20/2017 0456   CREATININE 1.15 (H) 11/19/2017 1950   CREATININE 1.05 (H) 11/01/2017 1105   CREATININE 1.1 10/28/2017 1242   CREATININE 1.2 (H) 09/28/2017 1355   CALCIUM 13.9 (HH) 11/20/2017 0456   CALCIUM >15.0 (HH) 11/19/2017 1950   CALCIUM 10.0 11/01/2017 1105   CALCIUM 10.4 10/28/2017 1242   CALCIUM 9.1 09/28/2017 1355   GFRNONAA 57 (L) 11/20/2017 0456   GFRNONAA 47 (L) 11/19/2017 1950   GFRNONAA 53 (L) 11/01/2017 1105   GFRAA >60 11/20/2017 0456   GFRAA 55 (L) 11/19/2017 1950   GFRAA >60 11/01/2017 1105   CBC    Component Value Date/Time   WBC 15.9 (H) 11/20/2017 0456   RBC 4.49 11/20/2017 0456   HGB 12.8 11/20/2017 0456   HGB 13.0 10/28/2017 1242   HCT 39.5 11/20/2017 0456   HCT 39.6 10/28/2017 1242   PLT 432 (H) 11/20/2017 0456   PLT 346 10/28/2017 1242   MCV 88.0 11/20/2017 0456   MCV 89.0 10/28/2017 1242   MCH 28.5 11/20/2017 0456   MCHC 32.4 11/20/2017 0456   RDW 14.0 11/20/2017 0456   RDW 13.8 10/28/2017 1242  LYMPHSABS 3.9 11/19/2017 1950   LYMPHSABS 2.6 10/28/2017 1242   MONOABS 1.3 (H) 11/19/2017 1950   MONOABS 0.8 10/28/2017 1242   EOSABS 0.1 11/19/2017 1950   EOSABS 0.3 10/28/2017 1242   BASOSABS 0.1 11/19/2017 1950   BASOSABS 0.0 10/28/2017 1242   HEPATIC Function Panel Recent Labs    10/28/17 1242 11/01/17 1105 11/19/17 1950  PROT 7.4 7.1 6.4*   HEMOGLOBIN A1C No components found for: HGA1C,  MPG CARDIAC ENZYMES Lab Results  Component Value Date   CKTOTAL 95 01/01/2009   CKMB 0.8 01/01/2009   TROPONINI <0.01        NO INDICATION OF MYOCARDIAL INJURY. 01/01/2009   TROPONINI <0.01        NO INDICATION OF MYOCARDIAL INJURY. 01/01/2009   TROPONINI <0.01        NO INDICATION OF MYOCARDIAL INJURY. 12/31/2008   BNP No results for input(s): PROBNP in the last 8760 hours. TSH No results for  input(s): TSH in the last 8760 hours. CHOLESTEROL No results for input(s): CHOL in the last 8760 hours.  Scheduled Meds: . albuterol  2.5 mg Nebulization Q6H  . amiodarone  400 mg Oral BID  . cholecalciferol  1,000 Units Oral Daily  . furosemide  40 mg Intravenous Q12H  . heparin  5,000 Units Subcutaneous Q8H  . levETIRAcetam  500 mg Oral BID  . losartan  25 mg Oral Daily  . meclizine  25 mg Oral TID   Continuous Infusions: . sodium chloride Stopped (11/19/17 2223)  . diltiazem (CARDIZEM) infusion 10 mg/hr (11/20/17 0441)  . piperacillin-tazobactam (ZOSYN)  IV Stopped (11/20/17 0857)  . vancomycin 750 mg (11/20/17 0942)   PRN Meds:.acetaminophen, albuterol, loratadine, polyethylene glycol, traMADol  Assessment/Plan: Altered mental status, improving Acute sepsis Right pleural effusion, malignant and or infection Acute atrial fibrillation with controlled ventricular response Obesity Type 2 diabetes mellitus Non-small cell lung CA with metastasis Hypercalcemia of malignancy  Continue medical treatment. Add tramadol for pain control. Continue diltiazem and add IV Lasix.    LOS: 0 days    Dixie Dials  MD  11/20/2017, 10:10 AM

## 2017-11-20 NOTE — ED Notes (Addendum)
1 unsuccessful attempt to draw labs, R hand; phlebotomy called

## 2017-11-20 NOTE — ED Notes (Signed)
Pt with no difficulty swallowing. No changes in lung sounds after medication administration.

## 2017-11-20 NOTE — Progress Notes (Signed)
Pt received from the ED. VSS. Patient alert to self and place. Oriented to room. Bed alarm on, call light in place. Will continue to monitor.  Clyde Canterbury, RN

## 2017-11-20 NOTE — ED Notes (Signed)
Pt O2 increased to 4L [place]

## 2017-11-20 NOTE — ED Notes (Signed)
Patient was tranferred to a hospital bed, from a stretcher.

## 2017-11-20 NOTE — ED Notes (Signed)
Admitting MD - Dr. Doylene Canard, paged to [facility] @ [phone] to [phone].

## 2017-11-20 NOTE — H&P (Signed)
Referring Physician:  Pricilla F Tweedy is an 71 y.o. female.                       Chief Complaint: Altered mental status  HPI: 71 year old female with recent diagnosis of non-small cell CA with mets has been sleepy and confused for last 3 days. Her chest x-ray and CT of chest is positive for right sided pleural effusion with possible pneumonia/empyema.  Her WBC count is elevated and her calcium level is high at over 15 mg/dl and treated with IV fluids. Venous lactic acid is over 2. She also has atrial fibrillation with RVR being treated with IV diltiazem.  Past Medical History:  Diagnosis Date  . Anxiety   . Arthritis   . Cancer ([place])   . Chronic back pain greater than 3 months duration   . Chronic neck pain   . COPD (chronic obstructive pulmonary disease) ([place])   . Cough   . Diabetes mellitus   . Dyspnea   . GERD (gastroesophageal reflux disease)   . H/O hiatal hernia   . Headache(784.0)   . Hemorrhoids   . Herniated disc   . History of kidney stones   . Hx of echocardiogram 2010   normal EF  . Hypertension   . Palpitations   . Shortness of breath on exertion 11/10/11   "sometimes"  . Vertigo   . Vertigo       Past Surgical History:  Procedure Laterality Date  . ABDOMINAL HYSTERECTOMY  1997?   "partial"  . ANTERIOR CERVICAL DECOMP/DISCECTOMY FUSION N/A 12/25/2016   Procedure: ANTERIOR CERVICAL DECOMPRESSION/DISCECTOMY FUSION CERVICAL FOUR CERVICAL FIVE;  Surgeon: Ashok Pall, MD;  Location: [facility] [facility];  Service: Neurosurgery;  Laterality: N/A;  . CHOLECYSTECTOMY  ~ 2008  . COLONOSCOPY    . EYE SURGERY     cateracts  . FRACTURE SURGERY  2004?   right shoulder  . IR THORACENTESIS ASP PLEURAL SPACE W/IMG GUIDE  11/10/2017  . KNEE ARTHROSCOPY  2004   right  . KNEE SURGERY     right  . neck fusion    . SHOULDER OPEN ROTATOR CUFF REPAIR  07/20/2011   left  . TONSILLECTOMY      Family History  Problem Relation Age of Onset  . Lung cancer Sister    Social History:  reports  that she quit smoking about 4 months ago. Her smoking use included cigarettes. She has a 7.50 pack-year smoking history. she has never used smokeless tobacco. She reports that she does not drink alcohol or use drugs.  Allergies:  Allergies  Allergen Reactions  . Oxycodone Hives and Itching     (Not in a hospital admission)  Results for orders placed or performed during the hospital encounter of 11/19/17 (from the past 48 hour(s))  Comprehensive metabolic panel     Status: Abnormal   Collection Time: 11/19/17  7:50 PM  Result Value Ref Range   Sodium 133 (L) 135 - 145 mmol/L   Potassium 4.1 3.5 - 5.1 mmol/L   Chloride 102 101 - 111 mmol/L   CO2 23 22 - 32 mmol/L   Glucose, Bld 180 (H) 65 - 99 mg/dL   BUN 23 (H) 6 - 20 mg/dL   Creatinine, Ser 1.15 (H) 0.44 - 1.00 mg/dL   Calcium >15.0 (HH) 8.9 - 10.3 mg/dL   Total Protein 6.4 (L) 6.5 - 8.1 g/dL   Albumin 2.5 (L) 3.5 - 5.0 g/dL  AST 18 15 - 41 U/L   ALT 13 (L) 14 - 54 U/L   Alkaline Phosphatase 151 (H) 38 - 126 U/L   Total Bilirubin 0.8 0.3 - 1.2 mg/dL   GFR calc non Af Amer 47 (L) >60 mL/min   GFR calc Af Amer 55 (L) >60 mL/min    Comment: (NOTE) The eGFR has been calculated using the CKD EPI equation. This calculation has not been validated in all clinical situations. eGFR's persistently <60 mL/min signify possible Chronic Kidney Disease.    Anion gap 8 5 - 15  CBC WITH DIFFERENTIAL     Status: Abnormal   Collection Time: 11/19/17  7:50 PM  Result Value Ref Range   WBC 18.2 (H) 4.0 - 10.5 K/uL   RBC 4.78 3.87 - 5.11 MIL/uL   Hemoglobin 13.8 12.0 - 15.0 g/dL   HCT 42.0 36.0 - 46.0 %   MCV 87.9 78.0 - 100.0 fL   MCH 28.9 26.0 - 34.0 pg   MCHC 32.9 30.0 - 36.0 g/dL   RDW 13.9 11.5 - 15.5 %   Platelets 445 (H) 150 - 400 K/uL   Neutrophils Relative % 70 %   Neutro Abs 12.8 (H) 1.7 - 7.7 K/uL   Lymphocytes Relative 22 %   Lymphs Abs 3.9 0.7 - 4.0 K/uL   Monocytes Relative 7 %   Monocytes Absolute 1.3 (H) 0.1 - 1.0  K/uL   Eosinophils Relative 1 %   Eosinophils Absolute 0.1 0.0 - 0.7 K/uL   Basophils Relative 0 %   Basophils Absolute 0.1 0.0 - 0.1 K/uL  Urinalysis, Complete w Microscopic     Status: Abnormal   Collection Time: 11/19/17  7:50 PM  Result Value Ref Range   Color, Urine YELLOW YELLOW   APPearance CLEAR CLEAR   Specific Gravity, Urine 1.020 1.005 - 1.030   pH 5.0 5.0 - 8.0   Glucose, UA 50 (A) NEGATIVE mg/dL   Hgb urine dipstick SMALL (A) NEGATIVE   Bilirubin Urine NEGATIVE NEGATIVE   Ketones, ur NEGATIVE NEGATIVE mg/dL   Protein, ur NEGATIVE NEGATIVE mg/dL   Nitrite NEGATIVE NEGATIVE   Leukocytes, UA NEGATIVE NEGATIVE   RBC / HPF 0-5 0 - 5 RBC/hpf   WBC, UA 0-5 0 - 5 WBC/hpf   Bacteria, UA NONE SEEN NONE SEEN   Squamous Epithelial / LPF 0-5 (A) NONE SEEN   Mucus PRESENT   Urine culture     Status: None (Preliminary result)   Collection Time: 11/19/17  8:12 PM  Result Value Ref Range   Specimen Description URINE, CATHETERIZED    Special Requests NONE    Culture PENDING    Report Status PENDING   I-stat troponin, ED     Status: Abnormal   Collection Time: 11/19/17  8:14 PM  Result Value Ref Range   Troponin i, poc 0.12 (HH) 0.00 - 0.08 ng/mL   Comment NOTIFIED PHYSICIAN    Comment 3            Comment: Due to the release kinetics of cTnI, a negative result within the first hours of the onset of symptoms does not rule out myocardial infarction with certainty. If myocardial infarction is still suspected, repeat the test at appropriate intervals.   I-Stat CG4 Lactic Acid, ED     Status: Abnormal   Collection Time: 11/19/17  8:16 PM  Result Value Ref Range   Lactic Acid, Venous 2.18 (HH) 0.5 - 1.9 mmol/L   Comment NOTIFIED PHYSICIAN  CBG monitoring, ED     Status: Abnormal   Collection Time: 11/19/17  8:17 PM  Result Value Ref Range   Glucose-Capillary 159 (H) 65 - 99 mg/dL  CBG monitoring, ED     Status: Abnormal   Collection Time: 11/19/17  8:55 PM  Result Value  Ref Range   Glucose-Capillary 168 (H) 65 - 99 mg/dL   Comment 1 Notify RN    Comment 2 Document in Chart   I-Stat CG4 Lactic Acid, ED  (not at  [facility])     Status: Abnormal   Collection Time: 11/19/17 10:15 PM  Result Value Ref Range   Lactic Acid, Venous 2.28 (HH) 0.5 - 1.9 mmol/L   Comment NOTIFIED PHYSICIAN   I-Stat Venous Blood Gas, ED (order at [facility] and [facility] only)     Status: Abnormal   Collection Time: 11/19/17 10:15 PM  Result Value Ref Range   pH, Ven 7.388 7.250 - 7.430   pCO2, Ven 36.1 (L) 44.0 - 60.0 mmHg   pO2, Ven 54.0 (H) 32.0 - 45.0 mmHg   Bicarbonate 21.7 20.0 - 28.0 mmol/L   TCO2 23 22 - 32 mmol/L   O2 Saturation 88.0 %   Acid-base deficit 3.0 (H) 0.0 - 2.0 mmol/L   Patient temperature HIDE    Sample type VENOUS    Dg Chest 2 View  Result Date: 11/19/2017 CLINICAL DATA:  Lethargic EXAM: CHEST  2 VIEW COMPARISON:  11/10/2017, 11/09/2017, 07/05/2017, 11/01/2017 FINDINGS: Re- demonstrated right upper lobe lung mass. Increasing pleural effusion in the right thorax with worsening consolidation at the right middle lobe and lung base. Cardiomediastinal silhouette partially obscured. Aortic atherosclerosis. Left lung is grossly clear. IMPRESSION: 1. Worsening right pleural effusion and airspace consolidation at the right middle lobe and right lung base. 2. Re- demonstrated right upper lobe lung mass. Electronically Signed   By: Donavan Foil M.D.   On: 11/19/2017 20:47   Ct Head Wo Contrast  Result Date: 11/19/2017 CLINICAL DATA:  Acute onset of lethargy and diaphoresis. Altered level of consciousness. EXAM: CT HEAD WITHOUT CONTRAST TECHNIQUE: Contiguous axial images were obtained from the base of the skull through the vertex without intravenous contrast. COMPARISON:  CT of the head performed 10/04/2017 FINDINGS: Brain: The patient's known metastatic lesion at the left superior frontal lobe has decreased slightly in size, measuring approximately 9 mm, with decreased surrounding  vasogenic edema. There is no evidence of acute hemorrhage at this time. No evidence of acute infarction, hydrocephalus or extra-axial collection. The brainstem and fourth ventricle are within normal limits. The basal ganglia are unremarkable in appearance. No midline shift is seen. Vascular: No hyperdense vessel or unexpected calcification. Skull: There is no evidence of fracture; visualized osseous structures are unremarkable in appearance. Sinuses/Orbits: The orbits are within normal limits. The paranasal sinuses and mastoid air cells are well-aerated. Other: No significant soft tissue abnormalities are seen. IMPRESSION: Known metastatic lesion at the left superior frontal lobe has decreased slightly in size, measuring approximately 9 mm, with decreased surrounding vasogenic edema. No evidence of acute hemorrhage at this time. Electronically Signed   By: Garald Balding M.D.   On: 11/19/2017 23:32   Ct Angio Chest Pe W/cm &/or Wo Cm  Result Date: 11/19/2017 CLINICAL DATA:  Acute onset of diaphoresis, loss of appetite and right flank soreness. EXAM: CT ANGIOGRAPHY CHEST WITH CONTRAST TECHNIQUE: Multidetector CT imaging of the chest was performed using the standard protocol during bolus administration of intravenous contrast. Multiplanar CT image reconstructions and  MIPs were obtained to evaluate the vascular anatomy. CONTRAST:  64m ISOVUE-370 IOPAMIDOL (ISOVUE-370) INJECTION 76% COMPARISON:  CTA of the chest performed 11/01/2017 FINDINGS: Cardiovascular:  There is no evidence of pulmonary embolus. The heart is normal in size. Scattered coronary artery calcifications are seen. Scattered calcification is noted along the aortic arch and proximal great vessels. Mediastinum/Nodes: There is irregular nodularity along the anterior pericardium, new from the recent prior study. Given the appearance of the right-sided pleural space, this is concerning for either metastatic disease or a complex malignant effusion. Large  loculated effusions abut the right side of the mediastinum and fill the azygoesophageal recess, with a soft tissue rind, concerning for metastatic disease. An enlarged 1.5 cm right paratracheal node is concerning for metastatic disease. The visualized portions of the thyroid gland are unremarkable. No axillary lymphadenopathy is seen. Lungs/Pleura: There appears to have been very rapid progression of metastatic disease along the pleural space at the right hemithorax. Diffuse irregular soft tissue thickening is noted along the entirety of the right-sided pleura, mostly new from the recent prior study, reflecting the patient's underlying malignancy. The cavitary lesion at the right lung apex has increased in size to 4.1 cm. A significantly enlarging loculated moderate to large right-sided pleural effusion is noted, with peripheral soft tissue rind, concerning for malignant effusion. There is underlying compressive atelectasis. Given the patient's signs of infection, empyema cannot be excluded. The left lung appears clear.  No pneumothorax is seen. Upper Abdomen: The visualized portions of the liver and spleen are unremarkable. The patient is status post cholecystectomy, with clips noted at the gallbladder fossa. The visualized portions of the pancreas and adrenal glands are within normal limits. Musculoskeletal: No acute osseous abnormalities are identified. The visualized musculature is unremarkable in appearance. Review of the MIP images confirms the above findings. IMPRESSION: 1. No evidence of pulmonary embolus. 2. Very rapid progression of metastatic disease along the pleural space at the right hemithorax. Diffuse irregular soft tissue thickening along the entirety of the right-sided pleura, mostly new from the recent prior study, reflecting the patient's underlying malignancy. 3. Significantly enlarging loculated moderate to large right-sided pleural effusion noted, with peripheral soft tissue rind, concerning  for malignant effusion. Underlying compressive atelectasis. Given the patient's signs of infection, empyema cannot be excluded. 4. Associated soft tissue components along the periphery of the right-sided pleural effusion abut the mediastinum, concerning for metastatic disease. Irregular nodularity along the anterior pericardium is new from the recent prior study and is also concerning for metastatic disease, though a complex malignant effusion might have a similar appearance. 5. 4.1 cm cavitary lesion at the right lung apex has increased mildly in size, reflecting the patient's malignancy. 6. Enlarged 1.5 cm right paratracheal node, concerning for metastatic disease. 7. Scattered coronary artery calcifications seen. These results were called by telephone at the time of interpretation on 11/19/2017 at 11:45 pm to [facility], who verbally acknowledged these results. Electronically Signed   By: JGarald BaldingM.D.   On: 11/19/2017 23:52    Review Of Systems Constitutional: No fever, chills, weight loss or gain. Eyes: No vision change, wears glasses. No discharge or pain. Ears: No hearing loss, No tinnitus. Respiratory: No asthma, positive COPD, pneumonias, shortness of breath. No hemoptysis. Cardiovascular: Positive chest pain, palpitation, leg edema. Gastrointestinal: No nausea, vomiting, diarrhea, constipation. No GI bleed. No hepatitis. Genitourinary: No dysuria, hematuria, kidney stone. No incontinance. Neurological: Positive headache, stroke, seizures.  Psychiatry: No psych facility admission for anxiety, depression, suicide. No  detox. Skin: No rash. Musculoskeletal: Positive joint pain, no fibromyalgia. Positive neck pain, back pain. Lymphadenopathy: No lymphadenopathy. Hematology: No anemia or easy bruising.   Blood pressure 94/69, pulse (!) 132, temperature (!) 100.4 F (38 C), temperature source Rectal, resp. rate (!) 23, SpO2 90 %. There is no height or weight on file to calculate  BMI. General appearance: alert, cooperative, appears stated age and no distress Head: Normocephalic, atraumatic. Eyes: Brown eyes, pink conjunctiva, corneas clear. PERRL, EOM's intact. Neck: No adenopathy, no carotid bruit, no JVD, supple, symmetrical, trachea midline and thyroid not enlarged. Resp: Dullness over right lower lung. Clear breath sounds anteriorly. Cardio: Irregular rate and rhythm, S1, S2 normal, II/VI systolic murmur, no click, rub or gallop GI: Soft, non-tender; bowel sounds normal; no organomegaly. Extremities: No edema, cyanosis or clubbing. Skin: Warm and dry.  Neurologic: Alert and oriented X 1, decreased strength in right upper and lower extremities.  Assessment/Plan Altered mental status Sepsis Right pleural effusion, malignant and or infection Acute atrial fibrillation with RVR, CHA2DS2VASc score of 6 Obesity Type II DM Non-small cell lung CA with metastasis Hypercalcemia of malignancy  Admit to step down. CCM consult. IV hydration IV antibiotics  Birdie Riddle, MD  11/20/2017, 1:08 AM

## 2017-11-20 NOTE — ED Notes (Signed)
Will, PA aware of pt HR. This RN attempting to start second line, Will, PA at the bedside. Pt alert.

## 2017-11-20 NOTE — ED Notes (Signed)
Pt placed on 2L [place]. O2 sat at approx 89-90% on RA. Increased to 94%

## 2017-11-20 NOTE — Consult Note (Addendum)
.. ..  Name: [name] MRN: [id number] DOB: 1946-07-14    ADMISSION DATE:  11/19/2017 CONSULTATION DATE:  11/19/17  REFERRING MD :  ED Physician  CHIEF COMPLAINT: altered mental status  BRIEF PATIENT DESCRIPTION:  71 y.o. Female with a PMHx of metastatic NSCLC  who presents to the ED via EMS with altered mental status found to have Hypercalcemia of malignancy and started on IVF and zometa.  SIGNIFICANT EVENTS  Change in mental status Elevated calcium levels on labs  STUDIES:  CT chest   HISTORY OF PRESENT ILLNESS:  71 y.o. Female with a PMHx of metastatic NSCLC  who presents to the ED via EMS with altered mental status.per daughter the patient has been sick for the past 4 days. Patient has been more sleepy and confused for three days and has a diminished appetite recently.  Earlier on 11/19/17 they found her sweating while in her chair sitting. Also reports chest pain that is reproducible on palpation of anterior and lateral right chest wall described as sharp. Pt also has a mildly productive cough.  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 Most of the history was obtained from her daughter and her brother's verbal account.    PAST MEDICAL HISTORY :   has a past medical history of Anxiety, Arthritis, Cancer ([place]), Chronic back pain greater than 3 months duration, Chronic neck pain, COPD (chronic obstructive pulmonary disease) ([place]), Cough, Diabetes mellitus, Dyspnea, GERD (gastroesophageal reflux disease), H/O hiatal hernia, Headache(784.0), Hemorrhoids, Herniated disc, History of kidney stones, echocardiogram (2010), Hypertension, Palpitations, Shortness of breath on exertion (11/10/11), Vertigo, and Vertigo.  has a past surgical history that includes Knee surgery; Shoulder open rotator cuff repair (07/20/2011); Fracture surgery (2004?); Knee  arthroscopy (2004); Tonsillectomy; Cholecystectomy (~ 2008); Abdominal hysterectomy (1997?); neck fusion; Colonoscopy; Anterior cervical decomp/discectomy fusion (N/A, 12/25/2016); Eye surgery; and IR THORACENTESIS ASP PLEURAL SPACE W/IMG GUIDE (11/10/2017). Prior to Admission medications   Medication Sig Start Date End Date Taking? Authorizing Provider  acetaminophen (TYLENOL) 500 MG tablet Take 1 tablet (500 mg total) every 6 (six) hours as needed by mouth for fever. 10/11/17   Dixie Dials, MD  albuterol (PROVENTIL) (2.5 MG/3ML) 0.083% nebulizer solution USE 1 VIAL EVERY 6 HOURS AS NEEDED FOR SHORTNESS OF BREATH 10/15/15   [provider]  albuterol (VENTOLIN HFA) 108 (90 Base) MCG/ACT inhaler Inhale 2 puffs into the lungs every 6 (six) hours as needed for wheezing or shortness of breath.    [provider]  cholecalciferol (VITAMIN D) 1000 units tablet Take 1,000 Units by mouth daily.    [provider]  diltiazem (CARDIZEM) 30 MG tablet Take 30 mg by mouth 3 (three) times daily. 11/03/17   [provider]  esomeprazole (NEXIUM) 40 MG capsule Take 1 capsule (40 mg total) by mouth daily as needed (acid reflux). 10/22/17   Angiulli, Lavon Paganini, PA-C  HYDROcodone-acetaminophen (NORCO/VICODIN) 5-325 MG tablet Take 1 tablet by mouth every 4 (four) hours as needed for moderate pain or severe pain (As needed for pain.). Patient not taking: Reported on 11/12/2017 10/22/17   Angiulli, Lavon Paganini, PA-C  HYDROmorphone (DILAUDID) 2 MG tablet Take 1 tablet (2 mg total) by mouth every 4 (four) hours as needed for severe pain. 11/18/17   Grace Isaac, MD  JANUMET 50-500 MG tablet Take 1 tablet by mouth 2 (two) times daily. 09/16/17  [provider]  levETIRAcetam (KEPPRA) 500 MG tablet Take 1 tablet (500 mg total) by mouth 2 (two) times daily. 10/22/17   Angiulli, Lavon Paganini, PA-C  loratadine (CLARITIN) 10 MG tablet Take 10 mg by mouth daily as needed for allergies.     [provider]  losartan (COZAAR) 25 MG tablet Take 1 tablet (25 mg total) by mouth daily. 10/22/17   Angiulli, Lavon Paganini, PA-C  meclizine (ANTIVERT) 25 MG tablet Take 1 tablet (25 mg total) by mouth 3 (three) times daily. Patient not taking: Reported on 11/12/2017 10/22/17   Angiulli, Lavon Paganini, PA-C  [facility] VERIO test strip USE AS DIRECTED EVERY DAY 09/13/17   [provider]  polyethylene glycol (MIRALAX / GLYCOLAX) packet Take 17 g daily as needed by mouth for moderate constipation. 10/11/17   Dixie Dials, MD  potassium chloride (KLOR-CON M10) 10 MEQ tablet Take 1 tablet (10 mEq total) by mouth daily. 10/22/17   Angiulli, Lavon Paganini, PA-C  rosuvastatin (CRESTOR) 10 MG tablet Take 1 tablet (10 mg total) by mouth daily. 10/22/17   Angiulli, Lavon Paganini, PA-C  traMADol (ULTRAM) 50 MG tablet Take 50 mg by mouth every 12 (twelve) hours as needed. 11/01/17   [provider]  umeclidinium bromide (INCRUSE ELLIPTA) 62.5 MCG/INH AEPB Inhale 1 puff into the lungs daily. 10/22/17   Angiulli, Lavon Paganini, PA-C   Allergies  Allergen Reactions  . Oxycodone Hives and Itching    FAMILY HISTORY:  family history includes Lung cancer in her sister. SOCIAL HISTORY:  reports that she quit smoking about 4 months ago. Her smoking use included cigarettes. She has a 7.50 pack-year smoking history. she has never used smokeless tobacco. She reports that she does not drink alcohol or use drugs.  REVIEW OF SYSTEMS:  Bolded items are pertinent positives Constitutional: Negative for fever, chills, weight loss, malaise/fatigue and diaphoresis.  HENT: Negative for hearing loss, ear pain, nosebleeds, congestion, sore throat, neck pain, tinnitus and ear discharge.   Eyes: Negative for blurred vision, double vision, photophobia, pain, discharge and redness.  Respiratory: Cough, hemoptysis, sputum production, shortness of breath, wheezing and stridor.   Cardiovascular: pleuritic chest pain,  palpitations, orthopnea, claudication, leg swelling and PND.  Gastrointestinal: Negative for heartburn, nausea, vomiting, abdominal pain, diarrhea, constipation, blood in stool and melena.  Genitourinary: Negative for dysuria, urgency, frequency, hematuria and flank pain.  Musculoskeletal: Negative for myalgias, back pain, joint pain and falls.  Skin: Negative for itching and rash.  Neurological: Negative for dizziness, tingling, tremors, sensory change, speech change, focal weakness, seizures, loss of consciousness, weakness and headaches.  Endo/Heme/Allergies: Negative for environmental allergies and polydipsia. Does not bruise/bleed easily.  SUBJECTIVE:   VITAL SIGNS: Temp:  [98.2 F (36.8 C)-100.4 F (38 C)] 100.4 F (38 C) (12/28 2011) Pulse Rate:  [86-163] 127 (12/29 0115) Resp:  [16-35] 27 (12/29 0115) BP: (89-139)/(59-99) 108/59 (12/29 0115) SpO2:  [89 %-96 %] 96 % (12/29 0115)  PHYSICAL EXAMINATION: General:  Obese, in no acute distress Neuro:  AAOx3 with some garbled speech HEENT:  Normocephalic, nasal cannula in nares Cardiovascular:  Irregularly irregular S1 and S2 appreciated Lungs:  No appreciable wheezing on exp phase no crackles Abdomen:  Soft obese abdomen  Musculoskeletal:  No gross deformities Skin:  Grossly intact no evidence of rash  Recent Labs  Lab 11/19/17 1950  NA 133*  K 4.1  CL 102  CO2 23  BUN 23*  CREATININE 1.15*  GLUCOSE 180*   Recent Labs  Lab  11/19/17 1950  HGB 13.8  HCT 42.0  WBC 18.2*  PLT 445*   Dg Chest 2 View  Result Date: 11/19/2017 CLINICAL DATA:  Lethargic EXAM: CHEST  2 VIEW COMPARISON:  11/10/2017, 11/09/2017, 07/05/2017, 11/01/2017 FINDINGS: Re- demonstrated right upper lobe lung mass. Increasing pleural effusion in the right thorax with worsening consolidation at the right middle lobe and lung base. Cardiomediastinal silhouette partially obscured. Aortic atherosclerosis. Left lung is grossly clear. IMPRESSION: 1.  Worsening right pleural effusion and airspace consolidation at the right middle lobe and right lung base. 2. Re- demonstrated right upper lobe lung mass. Electronically Signed   By: Donavan Foil M.D.   On: 11/19/2017 20:47   Ct Head Wo Contrast  Result Date: 11/19/2017 CLINICAL DATA:  Acute onset of lethargy and diaphoresis. Altered level of consciousness. EXAM: CT HEAD WITHOUT CONTRAST TECHNIQUE: Contiguous axial images were obtained from the base of the skull through the vertex without intravenous contrast. COMPARISON:  CT of the head performed 10/04/2017 FINDINGS: Brain: The patient's known metastatic lesion at the left superior frontal lobe has decreased slightly in size, measuring approximately 9 mm, with decreased surrounding vasogenic edema. There is no evidence of acute hemorrhage at this time. No evidence of acute infarction, hydrocephalus or extra-axial collection. The brainstem and fourth ventricle are within normal limits. The basal ganglia are unremarkable in appearance. No midline shift is seen. Vascular: No hyperdense vessel or unexpected calcification. Skull: There is no evidence of fracture; visualized osseous structures are unremarkable in appearance. Sinuses/Orbits: The orbits are within normal limits. The paranasal sinuses and mastoid air cells are well-aerated. Other: No significant soft tissue abnormalities are seen. IMPRESSION: Known metastatic lesion at the left superior frontal lobe has decreased slightly in size, measuring approximately 9 mm, with decreased surrounding vasogenic edema. No evidence of acute hemorrhage at this time. Electronically Signed   By: Garald Balding M.D.   On: 11/19/2017 23:32   Ct Angio Chest Pe W/cm &/or Wo Cm  Result Date: 11/19/2017 CLINICAL DATA:  Acute onset of diaphoresis, loss of appetite and right flank soreness. EXAM: CT ANGIOGRAPHY CHEST WITH CONTRAST TECHNIQUE: Multidetector CT imaging of the chest was performed using the standard protocol  during bolus administration of intravenous contrast. Multiplanar CT image reconstructions and MIPs were obtained to evaluate the vascular anatomy. CONTRAST:  26mL ISOVUE-370 IOPAMIDOL (ISOVUE-370) INJECTION 76% COMPARISON:  CTA of the chest performed 11/01/2017 FINDINGS: Cardiovascular:  There is no evidence of pulmonary embolus. The heart is normal in size. Scattered coronary artery calcifications are seen. Scattered calcification is noted along the aortic arch and proximal great vessels. Mediastinum/Nodes: There is irregular nodularity along the anterior pericardium, new from the recent prior study. Given the appearance of the right-sided pleural space, this is concerning for either metastatic disease or a complex malignant effusion. Large loculated effusions abut the right side of the mediastinum and fill the azygoesophageal recess, with a soft tissue rind, concerning for metastatic disease. An enlarged 1.5 cm right paratracheal node is concerning for metastatic disease. The visualized portions of the thyroid gland are unremarkable. No axillary lymphadenopathy is seen. Lungs/Pleura: There appears to have been very rapid progression of metastatic disease along the pleural space at the right hemithorax. Diffuse irregular soft tissue thickening is noted along the entirety of the right-sided pleura, mostly new from the recent prior study, reflecting the patient's underlying malignancy. The cavitary lesion at the right lung apex has increased in size to 4.1 cm. A significantly enlarging loculated moderate to large right-sided  pleural effusion is noted, with peripheral soft tissue rind, concerning for malignant effusion. There is underlying compressive atelectasis. Given the patient's signs of infection, empyema cannot be excluded. The left lung appears clear.  No pneumothorax is seen. Upper Abdomen: The visualized portions of the liver and spleen are unremarkable. The patient is status post cholecystectomy, with clips  noted at the gallbladder fossa. The visualized portions of the pancreas and adrenal glands are within normal limits. Musculoskeletal: No acute osseous abnormalities are identified. The visualized musculature is unremarkable in appearance. Review of the MIP images confirms the above findings. IMPRESSION: 1. No evidence of pulmonary embolus. 2. Very rapid progression of metastatic disease along the pleural space at the right hemithorax. Diffuse irregular soft tissue thickening along the entirety of the right-sided pleura, mostly new from the recent prior study, reflecting the patient's underlying malignancy. 3. Significantly enlarging loculated moderate to large right-sided pleural effusion noted, with peripheral soft tissue rind, concerning for malignant effusion. Underlying compressive atelectasis. Given the patient's signs of infection, empyema cannot be excluded. 4. Associated soft tissue components along the periphery of the right-sided pleural effusion abut the mediastinum, concerning for metastatic disease. Irregular nodularity along the anterior pericardium is new from the recent prior study and is also concerning for metastatic disease, though a complex malignant effusion might have a similar appearance. 5. 4.1 cm cavitary lesion at the right lung apex has increased mildly in size, reflecting the patient's malignancy. 6. Enlarged 1.5 cm right paratracheal node, concerning for metastatic disease. 7. Scattered coronary artery calcifications seen. These results were called by telephone at the time of interpretation on 11/19/2017 at 11:45 pm to [facility], who verbally acknowledged these results. Electronically Signed   By: Garald Balding M.D.   On: 11/19/2017 23:52    ASSESSMENT / PLAN: Active Issues: 1. Metastatic NSCLC- brain mets- followed by Oncology she presented to neurology in October 2018 with c/o numbness in right arm and leg. 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 Previously on Keppra and decadron. Next MRI due in feb 2019. Right pleural effusion- most likely malignant-> recommend diagnostic thoracentesis non emergent 2. Hypercalcemia of malignancy- started appropriately on IVF and Zometa 3. Sepsis- LA 2.28 initially BP borderline but on evaluation markedly improved 112/93mmHg not on vasopressors and hemodynamically stable 4. AMS- pt is AAOx4 she is aware and communicates when asked questions, her words/speech is somewhat garbled but when asked to repeat it improves.  5. Afib RVR on Diltiazem  6. Type II DM- monitor BG and start on ISS  ..        I, Dr Seward Carol have personally reviewed patient's available data, including medical history, events of note, physical examination and test results as part of my evaluation. I have discussed with [place] physician and other care providers such as pharmacist, RN and RRT. The patient is critically ill with multiple organ systems failure and requires high complexity decision making for assessment and support, frequent evaluation and titration of therapies, application of advanced monitoring technologies and extensive interpretation of multiple databases. Critical Care Time devoted to patient care services described in this note is 60  Minutes. This time reflects time of care of this signee Dr Seward Carol. This critical care time does not reflect procedure time, or teaching time or supervisory time but could involve care discussion time    DISPOSITION: does not require ICU at this time. Admit to stepdown  CC TIME: 60 mins PROGNOSIS:  Poor FAMILY: 2 daughters and a brother at bedside and a son and daughter in law were phoned in. We had an extensive conversation regarding her current clinical condition, prognosis, and code status. They expressed that prior to this conversation they were not aware of the gravity of her disease. Daughter stated that she would not want life  support if she was at an irreversible state. They intend to have a conversation regarding goals of care and code status amongst each other. CODE STATUS: Full    Signed Dr Seward Carol Pul[facility]   11/20/2017, 1:31 AM

## 2017-11-20 NOTE — ED Notes (Signed)
This RN notified by XR that pt HR increased to approx 160 bpm while in radiology

## 2017-11-20 NOTE — ED Notes (Signed)
Clear Liquid diet lunch tray ordered @ 1022.

## 2017-11-20 NOTE — ED Notes (Addendum)
Pt alert to voice but seems confused. Will hold oral meds until pt more alert. EDP aware

## 2017-11-21 ENCOUNTER — Inpatient Hospital Stay (HOSPITAL_COMMUNITY): Payer: Medicare HMO

## 2017-11-21 DIAGNOSIS — J90 Pleural effusion, not elsewhere classified: Secondary | ICD-10-CM

## 2017-11-21 DIAGNOSIS — J9601 Acute respiratory failure with hypoxia: Secondary | ICD-10-CM

## 2017-11-21 LAB — COMPREHENSIVE METABOLIC PANEL
ALBUMIN: 2.5 g/dL — AB (ref 3.5–5.0)
ALK PHOS: 196 U/L — AB (ref 38–126)
ALT: 19 U/L (ref 14–54)
ANION GAP: 9 (ref 5–15)
AST: 38 U/L (ref 15–41)
BILIRUBIN TOTAL: 0.8 mg/dL (ref 0.3–1.2)
BUN: 12 mg/dL (ref 6–20)
CALCIUM: 11.9 mg/dL — AB (ref 8.9–10.3)
CO2: 26 mmol/L (ref 22–32)
CREATININE: 1.27 mg/dL — AB (ref 0.44–1.00)
Chloride: 96 mmol/L — ABNORMAL LOW (ref 101–111)
GFR calc Af Amer: 48 mL/min — ABNORMAL LOW (ref >60)
GFR calc non Af Amer: 42 mL/min — ABNORMAL LOW (ref >60)
GLUCOSE: 223 mg/dL — AB (ref 65–99)
Potassium: 3.3 mmol/L — ABNORMAL LOW (ref 3.5–5.1)
Sodium: 131 mmol/L — ABNORMAL LOW (ref 135–145)
TOTAL PROTEIN: 6.3 g/dL — AB (ref 6.5–8.1)

## 2017-11-21 LAB — RENAL FUNCTION PANEL
Albumin: 2.4 g/dL — ABNORMAL LOW (ref 3.5–5.0)
Anion gap: 9 (ref 5–15)
BUN: 13 mg/dL (ref 6–20)
CHLORIDE: 100 mmol/L — AB (ref 101–111)
CO2: 25 mmol/L (ref 22–32)
CREATININE: 1.14 mg/dL — AB (ref 0.44–1.00)
Calcium: 12.7 mg/dL — ABNORMAL HIGH (ref 8.9–10.3)
GFR calc non Af Amer: 48 mL/min — ABNORMAL LOW (ref >60)
GFR, EST AFRICAN AMERICAN: 55 mL/min — AB (ref >60)
Glucose, Bld: 217 mg/dL — ABNORMAL HIGH (ref 65–99)
Phosphorus: 1.5 mg/dL — ABNORMAL LOW (ref 2.5–4.6)
Potassium: 3.3 mmol/L — ABNORMAL LOW (ref 3.5–5.1)
Sodium: 134 mmol/L — ABNORMAL LOW (ref 135–145)

## 2017-11-21 LAB — CALCIUM, IONIZED: Calcium, Ionized, Serum: 8.8 mg/dL — ABNORMAL HIGH (ref 4.5–5.6)

## 2017-11-21 LAB — URINE CULTURE: Culture: NO GROWTH

## 2017-11-21 LAB — MAGNESIUM: Magnesium: 1.2 mg/dL — ABNORMAL LOW (ref 1.7–2.4)

## 2017-11-21 MED ORDER — MAGNESIUM SULFATE 2 GM/50ML IV SOLN
2.0000 g | Freq: Once | INTRAVENOUS | Status: AC
  Administered 2017-11-21: 2 g via INTRAVENOUS
  Filled 2017-11-21: qty 50

## 2017-11-21 MED ORDER — MIDAZOLAM HCL 2 MG/2ML IJ SOLN
INTRAMUSCULAR | Status: AC
  Filled 2017-11-21: qty 2

## 2017-11-21 MED ORDER — FENTANYL CITRATE (PF) 100 MCG/2ML IJ SOLN
INTRAMUSCULAR | Status: AC
  Filled 2017-11-21: qty 2

## 2017-11-21 MED ORDER — POTASSIUM CHLORIDE CRYS ER 20 MEQ PO TBCR
40.0000 meq | EXTENDED_RELEASE_TABLET | ORAL | Status: DC
Start: 2017-11-21 — End: 2017-11-21
  Administered 2017-11-21: 40 meq via ORAL
  Filled 2017-11-21: qty 2

## 2017-11-21 MED ORDER — SODIUM CHLORIDE 0.9% FLUSH
5.0000 mL | Freq: Three times a day (TID) | INTRAVENOUS | Status: DC
  Administered 2017-11-22 – 2017-11-26 (×8): 5 mL via INTRAVENOUS
  Administered 2017-11-27: 3 mL via INTRAVENOUS
  Administered 2017-11-28 (×2): 5 mL via INTRAVENOUS

## 2017-11-21 MED ORDER — FUROSEMIDE 10 MG/ML IJ SOLN
INTRAMUSCULAR | Status: AC
  Filled 2017-11-21: qty 8

## 2017-11-21 MED ORDER — SODIUM CHLORIDE 0.9 % IV SOLN: 100.0000 mg | Freq: Every day | INTRAVENOUS | Status: DC

## 2017-11-21 MED ORDER — MIDAZOLAM HCL 2 MG/2ML IJ SOLN
INTRAMUSCULAR | Status: DC | PRN
  Administered 2017-11-21 (×2): 0.5 mg via INTRAVENOUS

## 2017-11-21 MED ORDER — FENTANYL CITRATE (PF) 100 MCG/2ML IJ SOLN
INTRAMUSCULAR | Status: DC | PRN
  Administered 2017-11-21 (×2): 25 ug via INTRAVENOUS

## 2017-11-21 MED ORDER — SODIUM CHLORIDE 0.9 % IV SOLN
500.0000 mg | INTRAVENOUS | Status: DC
  Administered 2017-11-21 – 2017-11-23 (×3): 500 mg via INTRAVENOUS
  Filled 2017-11-21 (×4): qty 5

## 2017-11-21 MED ORDER — LIDOCAINE HCL 1 % IJ SOLN
INTRAMUSCULAR | Status: AC
Start: 2017-11-21 — End: 2017-11-22
  Filled 2017-11-21: qty 20

## 2017-11-21 MED ORDER — FUROSEMIDE 10 MG/ML IJ SOLN
80.0000 mg | Freq: Once | INTRAMUSCULAR | Status: AC
  Administered 2017-11-21: 80 mg via INTRAVENOUS

## 2017-11-21 MED ORDER — KCL IN DEXTROSE-NACL 40-5-0.45 MEQ/L-%-% IV SOLN
INTRAVENOUS | Status: DC
  Administered 2017-11-21: 11:00:00 via INTRAVENOUS
  Filled 2017-11-21 (×3): qty 1000

## 2017-11-21 MED ORDER — FUROSEMIDE 10 MG/ML IJ SOLN
80.0000 mg | INTRAMUSCULAR | Status: AC
  Administered 2017-11-21: 80 mg via INTRAVENOUS

## 2017-11-21 MED ORDER — SODIUM CHLORIDE 0.9% FLUSH
10.0000 mL | INTRAVENOUS | Status: DC | PRN
  Administered 2017-11-26 (×2): 10 mL
  Filled 2017-11-21 (×2): qty 40

## 2017-11-21 NOTE — Progress Notes (Signed)
Patient trying to get out of bed. Explain to patient she has pure wick and how it works. She is having inspir. And expir. Wheezes. Using abdo. Muscles resp 32. R.N. Aware and Resp. Call to come do breathing tx. Also call rapid response R.N. Around 2 a.m. And she will come to see patient shortly. o2 sats on 2 liters 97 %

## 2017-11-21 NOTE — Progress Notes (Signed)
Rapid Resp. R.N. Here to assess patient.

## 2017-11-21 NOTE — Progress Notes (Signed)
900 ml of urine output with Lasix 80 mg I.V.

## 2017-11-21 NOTE — Procedures (Signed)
12 Fr Right Chest tube Clear yellow fluid EBL 0 Comp 0

## 2017-11-21 NOTE — Progress Notes (Signed)
Pt transported to CT and back for right chest pleural drain placement.  Pt tolerated well.  Increased FIo2 slightly for procedure.  Maintained stable VS with SpO2 99-100% t/o procedure.  Pleural drain > 1000cc upon arrival to pt room.  Continue to monitor closely, give break off NIV as pt can tolerate.

## 2017-11-21 NOTE — Progress Notes (Signed)
Ref: Dixie Dials, MD   Subjective:  Episodes of shortness of breath. Now on Bipap. T max 98.1 degree F. Awake and sitting up in bed. Hypokalemia post diuresis. Yesterday patient was loaded with fluids for severe hypercalcemia. Calcium level is improving slowly.  Currently in sinus rhythm with diltiazem drip. Chest x-ray shows worsening of loculated right sided pleural effusion and metastatic disease.  Objective:  Vital Signs in the last 24 hours: Temp:  [97.3 F (36.3 C)-98.5 F (36.9 C)] 98.1 F (36.7 C) (12/30 0743) Pulse Rate:  [68-81] 73 (12/30 0946) Cardiac Rhythm: Normal sinus rhythm (12/30 0705) Resp:  [19-35] 30 (12/30 0946) BP: (101-148)/(59-71) 120/65 (12/30 0743) SpO2:  [90 %-98 %] 98 % (12/30 0946) Weight:  [99 kg (218 lb 4.1 oz)-99.2 kg (218 lb 11.1 oz)] 99 kg (218 lb 4.1 oz) (12/30 0659)  Physical Exam: BP Readings from Last 1 Encounters:  11/21/17 120/65     Wt Readings from Last 1 Encounters:  11/21/17 99 kg (218 lb 4.1 oz)    Weight change:  Body mass index is 34.18 kg/m. HEENT: [place] Post/AT, Eyes-Brown, PERL, EOMI, Conjunctiva-Pink, Sclera-Non-icteric Neck: No JVD, No bruit, Trachea midline. Lungs:  Decreased breath sounds right side with fine crackles at both bases and minimal wheezing, Bilateral. Cardiac: Regular rhythm, normal S1 and S2, no S3. II/VI systolic murmur. Abdomen: Soft, non-tender. BS present. Extremities: No edema present. No cyanosis. No clubbing. CNS: AxOx3, Cranial nerves grossly intact, moves all 4 extremities.  Skin: Warm and dry.   Intake/Output from previous day: 12/29 0701 - 12/30 0700 In: 1780 [P.O.:180; I.V.:900; IV Piggyback:700] Out: 9509 [Urine:1750]    Lab Results: BMET    Component Value Date/Time   NA 134 (L) 11/21/2017 0232   NA 135 11/20/2017 0456   NA 133 (L) 11/19/2017 1950   NA 139 10/28/2017 1242   NA 131 (L) 09/28/2017 1355   K 3.3 (L) 11/21/2017 0232   K 3.9 11/20/2017 0456   K 4.1 11/19/2017 1950   K 3.8  10/28/2017 1242   K 4.5 09/28/2017 1355   CL 100 (L) 11/21/2017 0232   CL 106 11/20/2017 0456   CL 102 11/19/2017 1950   CO2 25 11/21/2017 0232   CO2 21 (L) 11/20/2017 0456   CO2 23 11/19/2017 1950   CO2 24 10/28/2017 1242   CO2 23 09/28/2017 1355   GLUCOSE 217 (H) 11/21/2017 0232   GLUCOSE 186 (H) 11/20/2017 0456   GLUCOSE 180 (H) 11/19/2017 1950   GLUCOSE 164 (H) 10/28/2017 1242   GLUCOSE 248 (H) 09/28/2017 1355   BUN 13 11/21/2017 0232   BUN 18 11/20/2017 0456   BUN 23 (H) 11/19/2017 1950   BUN 15.4 10/28/2017 1242   BUN 25.7 09/28/2017 1355   CREATININE 1.14 (H) 11/21/2017 0232   CREATININE 0.98 11/20/2017 0456   CREATININE 1.15 (H) 11/19/2017 1950   CREATININE 1.1 10/28/2017 1242   CREATININE 1.2 (H) 09/28/2017 1355   CALCIUM 12.7 (H) 11/21/2017 0232   CALCIUM 13.9 (HH) 11/20/2017 0456   CALCIUM >15.0 (HH) 11/19/2017 1950   CALCIUM 10.4 10/28/2017 1242   CALCIUM 9.1 09/28/2017 1355   GFRNONAA 48 (L) 11/21/2017 0232   GFRNONAA 57 (L) 11/20/2017 0456   GFRNONAA 47 (L) 11/19/2017 1950   GFRAA 55 (L) 11/21/2017 0232   GFRAA >60 11/20/2017 0456   GFRAA 55 (L) 11/19/2017 1950   CBC    Component Value Date/Time   WBC 15.9 (H) 11/20/2017 0456   RBC 4.49 11/20/2017 0456  HGB 12.8 11/20/2017 0456   HGB 13.0 10/28/2017 1242   HCT 39.5 11/20/2017 0456   HCT 39.6 10/28/2017 1242   PLT 432 (H) 11/20/2017 0456   PLT 346 10/28/2017 1242   MCV 88.0 11/20/2017 0456   MCV 89.0 10/28/2017 1242   MCH 28.5 11/20/2017 0456   MCHC 32.4 11/20/2017 0456   RDW 14.0 11/20/2017 0456   RDW 13.8 10/28/2017 1242   LYMPHSABS 3.9 11/19/2017 1950   LYMPHSABS 2.6 10/28/2017 1242   MONOABS 1.3 (H) 11/19/2017 1950   MONOABS 0.8 10/28/2017 1242   EOSABS 0.1 11/19/2017 1950   EOSABS 0.3 10/28/2017 1242   BASOSABS 0.1 11/19/2017 1950   BASOSABS 0.0 10/28/2017 1242   HEPATIC Function Panel Recent Labs    10/28/17 1242 11/01/17 1105 11/19/17 1950  PROT 7.4 7.1 6.4*   HEMOGLOBIN  A1C No components found for: HGA1C,  MPG CARDIAC ENZYMES Lab Results  Component Value Date   CKTOTAL 95 01/01/2009   CKMB 0.8 01/01/2009   TROPONINI <0.01        NO INDICATION OF MYOCARDIAL INJURY. 01/01/2009   TROPONINI <0.01        NO INDICATION OF MYOCARDIAL INJURY. 01/01/2009   TROPONINI <0.01        NO INDICATION OF MYOCARDIAL INJURY. 12/31/2008   BNP No results for input(s): PROBNP in the last 8760 hours. TSH No results for input(s): TSH in the last 8760 hours. CHOLESTEROL No results for input(s): CHOL in the last 8760 hours.  Scheduled Meds: . albuterol  2.5 mg Nebulization Q6H  . amiodarone  400 mg Oral BID  . furosemide      . heparin  5,000 Units Subcutaneous Q8H   Continuous Infusions: . dextrose 5 % and 0.45 % NaCl with KCl 40 mEq/L 50 mL/hr at 11/21/17 1120  . diltiazem (CARDIZEM) infusion 10 mg/hr (11/21/17 1102)  . levETIRAcetam    . magnesium sulfate 1 - 4 g bolus IVPB    . piperacillin-tazobactam (ZOSYN)  IV Stopped (11/21/17 0932)  . vancomycin 750 mg (11/21/17 1100)   PRN Meds:.acetaminophen, albuterol, traMADol  Assessment/Plan: Acute sepsis, improving Altered mental status, improving Malignant right pleural effusion Shortness of breath from above. Obesity Type 2 DM Non-small cell lung CA with local and distant mets S/P stroke Hypercalcemia of malignancy Hypokalemia  Decrease IV fluids. Add potassium to IV fluids. Magnesium supplement. Discontinue most of the oral medications due to BiPAP use.   LOS: 1 day    Dixie Dials  MD  11/21/2017, 11:23 AM

## 2017-11-21 NOTE — Progress Notes (Signed)
500 ml and incont. x 2 urine out put with Lasix 40 mg I.V. Given at 21:45 11-20-17.

## 2017-11-21 NOTE — Progress Notes (Signed)
Pt called nurse to bedside for increased WOB and feeling SOB. CN and RN assessed, VSS.  Rapid response nurse called to bedside. MD paged and orders received for 80 IV lasix, 40 meq of K. RT placed pt on BiPap. MD at bedside. Pt reports that it is easier to breathe. Will continue to monitor.  Clyde Canterbury, RN

## 2017-11-21 NOTE — Consult Note (Signed)
Chief Complaint: Patient was seen in consultation today for  Chief Complaint  Patient presents with  . Altered Mental Status   at the request of * No referring provider recorded for this case *  Referring Physician(s): Lake Bells *  Supervising Physician: Marybelle Killings  Patient Status: [facility] - In-pt  History of Present Illness: [name] is a 71 y.o. female with a right malignant pleural effusion who is in respiratory compromise. Previous right thoracentesis performed, but the effusion has recurred. She is now on BiPap.  Past Medical History:  Diagnosis Date  . Anxiety   . Arthritis   . Cancer ([place])   . Chronic back pain greater than 3 months duration   . Chronic neck pain   . COPD (chronic obstructive pulmonary disease) ([place])   . Cough   . Diabetes mellitus   . Dyspnea   . GERD (gastroesophageal reflux disease)   . H/O hiatal hernia   . Headache(784.0)   . Hemorrhoids   . Herniated disc   . History of kidney stones   . Hx of echocardiogram 2010   normal EF  . Hypertension   . Palpitations   . Shortness of breath on exertion 11/10/11   "sometimes"  . Vertigo   . Vertigo     Past Surgical History:  Procedure Laterality Date  . ABDOMINAL HYSTERECTOMY  1997?   "partial"  . ANTERIOR CERVICAL DECOMP/DISCECTOMY FUSION N/A 12/25/2016   Procedure: ANTERIOR CERVICAL DECOMPRESSION/DISCECTOMY FUSION CERVICAL FOUR CERVICAL FIVE;  Surgeon: Ashok Pall, MD;  Location: [place];  Service: Neurosurgery;  Laterality: N/A;  . CHOLECYSTECTOMY  ~ 2008  . COLONOSCOPY    . EYE SURGERY     cateracts  . FRACTURE SURGERY  2004?   right shoulder  . IR THORACENTESIS ASP PLEURAL SPACE W/IMG GUIDE  11/10/2017  . KNEE ARTHROSCOPY  2004   right  . KNEE SURGERY     right  . neck fusion    . SHOULDER OPEN ROTATOR CUFF REPAIR  07/20/2011   left  . TONSILLECTOMY      Allergies: Oxycodone  Medications: Prior to Admission medications   Medication Sig Start Date End Date Taking?  Authorizing Provider  acetaminophen (TYLENOL) 500 MG tablet Take 1 tablet (500 mg total) every 6 (six) hours as needed by mouth for fever. 10/11/17  Yes Dixie Dials, MD  albuterol (PROVENTIL) (2.5 MG/3ML) 0.083% nebulizer solution USE 1 VIAL EVERY 6 HOURS AS NEEDED FOR SHORTNESS OF BREATH 10/15/15  Yes [provider]  albuterol (VENTOLIN HFA) 108 (90 Base) MCG/ACT inhaler Inhale 2 puffs into the lungs every 6 (six) hours as needed for wheezing or shortness of breath.   Yes [provider]  cholecalciferol (VITAMIN D) 1000 units tablet Take 1,000 Units by mouth daily.   Yes [provider]  diltiazem (CARDIZEM) 30 MG tablet Take 30 mg by mouth 3 (three) times daily. 11/03/17  Yes [provider]  esomeprazole (NEXIUM) 40 MG capsule Take 1 capsule (40 mg total) by mouth daily as needed (acid reflux). 10/22/17  Yes Angiulli, Lavon Paganini, PA-C  HYDROmorphone (DILAUDID) 2 MG tablet Take 1 tablet (2 mg total) by mouth every 4 (four) hours as needed for severe pain. 11/18/17  Yes Grace Isaac, MD  JANUMET 50-500 MG tablet Take 1 tablet by mouth 2 (two) times daily. 09/16/17  Yes [provider]  levETIRAcetam (KEPPRA) 500 MG tablet Take 1 tablet (500 mg total) by mouth 2 (two) times daily.  10/22/17  Yes Angiulli, Lavon Paganini, PA-C  losartan (COZAAR) 25 MG tablet Take 1 tablet (25 mg total) by mouth daily. 10/22/17  Yes Angiulli, Lavon Paganini, PA-C  meclizine (ANTIVERT) 25 MG tablet Take 1 tablet (25 mg total) by mouth 3 (three) times daily. 10/22/17  Yes Angiulli, Lavon Paganini, PA-C  metFORMIN (GLUCOPHAGE) 500 MG tablet Take 500 mg by mouth 2 (two) times daily with a meal.   Yes [provider]  metoprolol succinate (TOPROL-XL) 25 MG 24 hr tablet Take 25 mg by mouth daily.  11/17/17  Yes [provider]  polyethylene glycol (MIRALAX / GLYCOLAX) packet Take 17 g daily as needed by mouth for moderate constipation. 10/11/17  Yes Dixie Dials, MD    potassium chloride (KLOR-CON M10) 10 MEQ tablet Take 1 tablet (10 mEq total) by mouth daily. 10/22/17  Yes Angiulli, Lavon Paganini, PA-C  rosuvastatin (CRESTOR) 10 MG tablet Take 1 tablet (10 mg total) by mouth daily. 10/22/17  Yes Angiulli, Lavon Paganini, PA-C  umeclidinium bromide (INCRUSE ELLIPTA) 62.5 MCG/INH AEPB Inhale 1 puff into the lungs daily. 10/22/17  Yes Angiulli, Lavon Paganini, PA-C  HYDROcodone-acetaminophen (NORCO/VICODIN) 5-325 MG tablet Take 1 tablet by mouth every 4 (four) hours as needed for moderate pain or severe pain (As needed for pain.). Patient not taking: Reported on 11/12/2017 10/22/17   Angiulli, Lavon Paganini, PA-C  loratadine (CLARITIN) 10 MG tablet Take 10 mg by mouth daily as needed for allergies.    [provider]  [facility] VERIO test strip USE AS DIRECTED EVERY DAY 09/13/17   [provider]     Family History  Problem Relation Age of Onset  . Lung cancer Sister     Social History   Socioeconomic History  . Marital status: Married    Spouse name: None  . Number of children: None  . Years of education: None  . Highest education level: None  Social Needs  . Financial resource strain: None  . Food insecurity - worry: None  . Food insecurity - inability: None  . Transportation needs - medical: None  . Transportation needs - non-medical: None  Occupational History  . None  Tobacco Use  . Smoking status: Former Smoker    Packs/day: 0.25    Years: 30.00    Pack years: 7.50    Types: Cigarettes    Last attempt to quit: 07/08/2017    Years since quitting: 0.3  . Smokeless tobacco: Never Used  . Tobacco comment: 4-5 cigs / day  Substance and Sexual Activity  . Alcohol use: No  . Drug use: No  . Sexual activity: No  Other Topics Concern  . None  Social History Narrative  . None     Review of Systems: A 12 point ROS discussed and pertinent positives are indicated in the HPI above.  All other systems are negative.  Review of Systems  Vital  Signs: BP 120/63 (BP Location: Left Arm)   Pulse 74   Temp 98.1 F (36.7 C) (Oral)   Resp (!) 34   Ht 5\' 7"  (1.702 m)   Wt 218 lb 4.1 oz (99 kg)   SpO2 100%   BMI 34.18 kg/m   Physical Exam  Constitutional: She appears well-developed and well-nourished.  Neck: Normal range of motion. Neck supple.  Cardiovascular: Normal rate and regular rhythm.  Pulmonary/Chest: Breath sounds normal.  tachypneic and on BiPap    Imaging: Dg Chest 1 View  Result Date: 11/10/2017 CLINICAL DATA:  Status post  thoracentesis. EXAM: CHEST 1 VIEW COMPARISON:  Chest x-ray from yesterday. FINDINGS: Stable cardiomediastinal silhouette. Grossly unchanged right upper lobe lung mass. Interval decrease in size of now small right pleural effusion status post thoracentesis. No pneumothorax. Right basilar atelectasis is slightly improved. The left lung is clear. No acute osseous abnormality. IMPRESSION: 1. Interval decrease in size of now small right pleural effusion status post thoracentesis. No pneumothorax. 2. Unchanged right upper lobe mass. Electronically Signed   By: Titus Dubin M.D.   On: 11/10/2017 10:56   Dg Chest 2 View  Result Date: 11/19/2017 CLINICAL DATA:  Lethargic EXAM: CHEST  2 VIEW COMPARISON:  11/10/2017, 11/09/2017, 07/05/2017, 11/01/2017 FINDINGS: Re- demonstrated right upper lobe lung mass. Increasing pleural effusion in the right thorax with worsening consolidation at the right middle lobe and lung base. Cardiomediastinal silhouette partially obscured. Aortic atherosclerosis. Left lung is grossly clear. IMPRESSION: 1. Worsening right pleural effusion and airspace consolidation at the right middle lobe and right lung base. 2. Re- demonstrated right upper lobe lung mass. Electronically Signed   By: Donavan Foil M.D.   On: 11/19/2017 20:47   Dg Chest 2 View  Result Date: 11/09/2017 CLINICAL DATA:  Worsening shortness of breath.  Lung cancer. EXAM: CHEST  2 VIEW COMPARISON:  Chest radiographs  and CT 11/01/2017 FINDINGS: The cardiac silhouette is normal in size. A right upper lobe mass is similar in size to the recent prior radiographs. There is a moderate-sized right pleural effusion with associated loculated fluid and/or pleural thickening extending to the apex. The pleural effusion has enlarged from the prior radiographs. Right basilar airspace opacity has increased and likely represents atelectasis. Left basilar atelectasis on the prior radiographs has resolved. No pneumothorax is identified. Right upper quadrant abdominal surgical clips and prior cervical spine fusion are noted. IMPRESSION: 1. Enlarging right pleural effusion with worsening right basilar atelectasis. 2. Unchanged right upper lobe mass. Electronically Signed   By: Logan Bores M.D.   On: 11/09/2017 10:30   Dg Chest 2 View  Result Date: 11/01/2017 CLINICAL DATA:  Right upper chest pain. 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 EXAM: CHEST  2 VIEW COMPARISON:  October 13, 2017 FINDINGS: The heart size and mediastinal contours are stable. Right upper lobe mass/ carcinoma is identified unchanged. Mild atelectasis of both lung bases are noted. There is a small right pleural effusion. The visualized skeletal structures are stable. IMPRESSION: Right upper lobe mass/ carcinoma is identified unchanged. Mild atelectasis of bilateral lung bases. Small right pleural effusion. Electronically Signed   By: Abelardo Diesel M.D.   On: 11/01/2017 11:12   Ct Head Wo Contrast  Result Date: 11/19/2017 CLINICAL DATA:  Acute onset of lethargy and diaphoresis. Altered level of consciousness. EXAM: CT HEAD WITHOUT CONTRAST TECHNIQUE: Contiguous axial images were obtained from the base of the skull through the vertex without intravenous contrast. COMPARISON:  CT of the head performed 10/04/2017 FINDINGS: Brain: The patient's known metastatic lesion at the left superior frontal lobe has decreased slightly in size,  measuring approximately 9 mm, with decreased surrounding vasogenic edema. There is no evidence of acute hemorrhage at this time. No evidence of acute infarction, hydrocephalus or extra-axial collection. The brainstem and fourth ventricle are within normal limits. The basal ganglia are unremarkable in appearance. No midline shift is seen. Vascular: No hyperdense vessel or unexpected calcification. Skull: There is no evidence of fracture; visualized osseous structures are unremarkable in appearance. Sinuses/Orbits: The orbits are within normal limits. The  paranasal sinuses and mastoid air cells are well-aerated. Other: No significant soft tissue abnormalities are seen. IMPRESSION: Known metastatic lesion at the left superior frontal lobe has decreased slightly in size, measuring approximately 9 mm, with decreased surrounding vasogenic edema. No evidence of acute hemorrhage at this time. Electronically Signed   By: Garald Balding M.D.   On: 11/19/2017 23:32   Ct Angio Chest Pe W/cm &/or Wo Cm  Result Date: 11/19/2017 CLINICAL DATA:  Acute onset of diaphoresis, loss of appetite and right flank soreness. EXAM: CT ANGIOGRAPHY CHEST WITH CONTRAST TECHNIQUE: Multidetector CT imaging of the chest was performed using the standard protocol during bolus administration of intravenous contrast. Multiplanar CT image reconstructions and MIPs were obtained to evaluate the vascular anatomy. CONTRAST:  26mL ISOVUE-370 IOPAMIDOL (ISOVUE-370) INJECTION 76% COMPARISON:  CTA of the chest performed 11/01/2017 FINDINGS: Cardiovascular:  There is no evidence of pulmonary embolus. The heart is normal in size. Scattered coronary artery calcifications are seen. Scattered calcification is noted along the aortic arch and proximal great vessels. Mediastinum/Nodes: There is irregular nodularity along the anterior pericardium, new from the recent prior study. Given the appearance of the right-sided pleural space, this is concerning for either  metastatic disease or a complex malignant effusion. Large loculated effusions abut the right side of the mediastinum and fill the azygoesophageal recess, with a soft tissue rind, concerning for metastatic disease. An enlarged 1.5 cm right paratracheal node is concerning for metastatic disease. The visualized portions of the thyroid gland are unremarkable. No axillary lymphadenopathy is seen. Lungs/Pleura: There appears to have been very rapid progression of metastatic disease along the pleural space at the right hemithorax. Diffuse irregular soft tissue thickening is noted along the entirety of the right-sided pleura, mostly new from the recent prior study, reflecting the patient's underlying malignancy. The cavitary lesion at the right lung apex has increased in size to 4.1 cm. A significantly enlarging loculated moderate to large right-sided pleural effusion is noted, with peripheral soft tissue rind, concerning for malignant effusion. There is underlying compressive atelectasis. Given the patient's signs of infection, empyema cannot be excluded. The left lung appears clear.  No pneumothorax is seen. Upper Abdomen: The visualized portions of the liver and spleen are unremarkable. The patient is status post cholecystectomy, with clips noted at the gallbladder fossa. The visualized portions of the pancreas and adrenal glands are within normal limits. Musculoskeletal: No acute osseous abnormalities are identified. The visualized musculature is unremarkable in appearance. Review of the MIP images confirms the above findings. IMPRESSION: 1. No evidence of pulmonary embolus. 2. Very rapid progression of metastatic disease along the pleural space at the right hemithorax. Diffuse irregular soft tissue thickening along the entirety of the right-sided pleura, mostly new from the recent prior study, reflecting the patient's underlying malignancy. 3. Significantly enlarging loculated moderate to large right-sided pleural  effusion noted, with peripheral soft tissue rind, concerning for malignant effusion. Underlying compressive atelectasis. Given the patient's signs of infection, empyema cannot be excluded. 4. Associated soft tissue components along the periphery of the right-sided pleural effusion abut the mediastinum, concerning for metastatic disease. Irregular nodularity along the anterior pericardium is new from the recent prior study and is also concerning for metastatic disease, though a complex malignant effusion might have a similar appearance. 5. 4.1 cm cavitary lesion at the right lung apex has increased mildly in size, reflecting the patient's malignancy. 6. Enlarged 1.5 cm right paratracheal node, concerning for metastatic disease. 7. Scattered coronary artery calcifications seen. These results  were called by telephone at the time of interpretation on 11/19/2017 at 11:45 pm to [facility] PA, who verbally acknowledged these results. Electronically Signed   By: Garald Balding M.D.   On: 11/19/2017 23:52   Ct Angio Chest Pe W And/or Wo Contrast  Result Date: 11/01/2017 CLINICAL DATA:  Right-sided chest pain and abdominal pain, initial encounter EXAM: CT ANGIOGRAPHY CHEST CT ABDOMEN AND PELVIS WITH CONTRAST TECHNIQUE: Multidetector CT imaging of the chest was performed using the standard protocol during bolus administration of intravenous contrast. Multiplanar CT image reconstructions and MIPs were obtained to evaluate the vascular anatomy. Multidetector CT imaging of the abdomen and pelvis was performed using the standard protocol during bolus administration of intravenous contrast. CONTRAST:  167mL ISOVUE-370 IOPAMIDOL (ISOVUE-370) INJECTION 76% COMPARISON:  PET-CT from 07/28/2017, chest CT from 07/05/2017, CT of the abdomen from 01/28/2017 FINDINGS: CTA CHEST FINDINGS Cardiovascular: Thoracic aorta and its branches demonstrate atherosclerotic calcifications. No aneurysmal dilatation or dissection is seen.  Coronary calcifications are noted. No significant cardiac enlargement is seen. The pulmonary artery shows a normal branching pattern. No filling defects to suggest pulmonary emboli are identified. Mediastinum/Nodes: Thoracic inlet is within normal limits. There is a 13 mm short axis lymph node identified in the right peritracheal region adjacent to the azygos vein which has increased in size when compared with the prior exam. No definitive hilar or other mediastinal adenopathy is seen. The esophagus is within normal limits. Lungs/Pleura: Emphysematous changes are again noted. The left lung is clear. No significant infiltrate or effusion is noted. The known right upper lobe cavitary mass lesion is again seen and has shown interval increase in size now measuring 3.8 x 3.8 cm. It previously measured 2.6 cm in greatest dimension. A a moderate to large right-sided pleural effusion is noted with some associated atelectatic changes. Additionally there is a considerable amount of thickening in the pleura laterally adjacent to the mid upper lobe mass lesion and extending inferiorly. This is consistent with interval local invasion into the chest wall and likely contributing to the patient's chest pain. Musculoskeletal: Bony structures show degenerative change of the thoracic spine. No definitive bony invasion is noted on this exam. No compression deformities are seen. Review of the MIP images confirms the above findings. CT ABDOMEN and PELVIS FINDINGS Hepatobiliary: Gallbladder has been surgically removed. The liver shows no focal mass lesion. Pancreas: Unremarkable. No pancreatic ductal dilatation or surrounding inflammatory changes. Spleen: Normal in size without focal abnormality. Adrenals/Urinary Tract: The adrenal glands are within normal limits. Kidneys are well visualized bilaterally. A few renal cysts are noted particularly on the left with the largest measuring 2.7 cm in dimension. Scattered renal vascular  calcifications are noted. No renal calculi are seen. No obstructive changes are noted. The bladder is decompressed. Stomach/Bowel: The appendix is within normal limits. No obstructive or inflammatory changes of the bowel are seen. Stomach is within normal limits. Vascular/Lymphatic: Aortic atherosclerosis. No enlarged abdominal or pelvic lymph nodes. 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 Reproductive: Status post hysterectomy. No adnexal masses. Other: No abdominal wall hernia or abnormality. No abdominopelvic ascites. Musculoskeletal: Degenerative changes of lumbar spine are seen. Review of the MIP images confirms the above findings. IMPRESSION: CTA of the chest:  No evidence of pulmonary emboli. Interval enlargement of the cavitary right upper lobe mass lesion as described. There is now a moderate to large right-sided pleural effusion as well as significant pleural thickening consistent with local invasion  into the chest wall. This likely contributes to the patient's underlying discomfort. Interval enlargement of a pretracheal lymph node to 13 mm from 7 mm on 07/05/2017. Aortic Atherosclerosis (ICD10-I70.0) and Emphysema (ICD10-J43.9). CT of the abdomen and pelvis:  Postsurgical changes are noted. Stable ectasia of the abdominal aorta. Recommend followup by ultrasound in 5 years. This recommendation follows ACR consensus guidelines: White Paper of the ACR Incidental Findings Committee II on Vascular Findings. J Am Coll Radiol 2013; 10:789-794. Renal cystic change. Electronically Signed   By: Inez Catalina M.D.   On: 11/01/2017 14:47   Ct Abdomen Pelvis W Contrast  Result Date: 11/01/2017 CLINICAL DATA:  Right-sided chest pain and abdominal pain, initial encounter EXAM: CT ANGIOGRAPHY CHEST CT ABDOMEN AND PELVIS WITH CONTRAST TECHNIQUE: Multidetector CT imaging of the chest was performed using the standard protocol during bolus administration of  intravenous contrast. Multiplanar CT image reconstructions and MIPs were obtained to evaluate the vascular anatomy. Multidetector CT imaging of the abdomen and pelvis was performed using the standard protocol during bolus administration of intravenous contrast. CONTRAST:  139mL ISOVUE-370 IOPAMIDOL (ISOVUE-370) INJECTION 76% COMPARISON:  PET-CT from 07/28/2017, chest CT from 07/05/2017, CT of the abdomen from 01/28/2017 FINDINGS: CTA CHEST FINDINGS Cardiovascular: Thoracic aorta and its branches demonstrate atherosclerotic calcifications. No aneurysmal dilatation or dissection is seen. Coronary calcifications are noted. No significant cardiac enlargement is seen. The pulmonary artery shows a normal branching pattern. No filling defects to suggest pulmonary emboli are identified. Mediastinum/Nodes: Thoracic inlet is within normal limits. There is a 13 mm short axis lymph node identified in the right peritracheal region adjacent to the azygos vein which has increased in size when compared with the prior exam. No definitive hilar or other mediastinal adenopathy is seen. The esophagus is within normal limits. Lungs/Pleura: Emphysematous changes are again noted. The left lung is clear. No significant infiltrate or effusion is noted. The known right upper lobe cavitary mass lesion is again seen and has shown interval increase in size now measuring 3.8 x 3.8 cm. It previously measured 2.6 cm in greatest dimension. A a moderate to large right-sided pleural effusion is noted with some associated atelectatic changes. Additionally there is a considerable amount of thickening in the pleura laterally adjacent to the mid upper lobe mass lesion and extending inferiorly. This is consistent with interval local invasion into the chest wall and likely contributing to the patient's chest pain. Musculoskeletal: Bony structures show degenerative change of the thoracic spine. No definitive bony invasion is noted on this exam. No  compression deformities are seen. Review of the MIP images confirms the above findings. CT ABDOMEN and PELVIS FINDINGS Hepatobiliary: Gallbladder has been surgically removed. The liver shows no focal mass lesion. Pancreas: Unremarkable. No pancreatic ductal dilatation or surrounding inflammatory changes. Spleen: Normal in size without focal abnormality. Adrenals/Urinary Tract: The adrenal glands are within normal limits. Kidneys are well visualized bilaterally. A few renal cysts are noted particularly on the left with the largest measuring 2.7 cm in dimension. Scattered renal vascular calcifications are noted. No renal calculi are seen. No obstructive changes are noted. The bladder is decompressed. Stomach/Bowel: The appendix is within normal limits. No obstructive or inflammatory changes of the bowel are seen. Stomach is within normal limits. Vascular/Lymphatic: Aortic atherosclerosis. No enlarged abdominal or pelvic lymph nodes. 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 Reproductive: Status post hysterectomy. No adnexal masses. Other: No abdominal wall hernia or abnormality. No abdominopelvic ascites. Musculoskeletal: Degenerative  changes of lumbar spine are seen. Review of the MIP images confirms the above findings. IMPRESSION: CTA of the chest:  No evidence of pulmonary emboli. Interval enlargement of the cavitary right upper lobe mass lesion as described. There is now a moderate to large right-sided pleural effusion as well as significant pleural thickening consistent with local invasion into the chest wall. This likely contributes to the patient's underlying discomfort. Interval enlargement of a pretracheal lymph node to 13 mm from 7 mm on 07/05/2017. Aortic Atherosclerosis (ICD10-I70.0) and Emphysema (ICD10-J43.9). CT of the abdomen and pelvis:  Postsurgical changes are noted. Stable ectasia of the abdominal aorta. Recommend followup by ultrasound in 5  years. This recommendation follows ACR consensus guidelines: White Paper of the ACR Incidental Findings Committee II on Vascular Findings. J Am Coll Radiol 2013; 10:789-794. Renal cystic change. Electronically Signed   By: Inez Catalina M.D.   On: 11/01/2017 14:47   Dg Chest Port 1 View  Result Date: 11/21/2017 CLINICAL DATA:  Acute onset of worsening shortness of breath. EXAM: PORTABLE CHEST 1 VIEW COMPARISON:  Chest radiograph and CT of the chest performed 11/19/2017 FINDINGS: There appears to be interval enlargement of the large loculated right-sided malignant pleural effusions. Diffuse right-sided pleural metastatic disease also appears to be worsening. The left lung remains relatively clear. No pneumothorax is seen. The cardiomediastinal silhouette is not well assessed due to the loculated effusions. No acute osseous abnormalities are identified. IMPRESSION: Interval enlargement of large loculated right-sided malignant pleural effusions. Diffuse right-sided pleural metastatic disease also appears to be worsening. Electronically Signed   By: Garald Balding M.D.   On: 11/21/2017 02:26   Ir US Chest  Result Date: 11/02/2017 CLINICAL DATA:  71 year old female referred for diagnostic thoracentesis, with proven lung cancer EXAM: CHEST ULTRASOUND COMPARISON:  CT 11/01/2017, x-ray 11/01/2017 FINDINGS: Limited ultrasound for possible thoracentesis demonstrates trace fluid in the costophrenic sulcus. IMPRESSION: Ultrasound demonstrates trace fluid in the costophrenic sulcus, and attempted thoracentesis was deferred at this time. If a repeat attempt is warranted, 24 hours Ajaya allow change in the size of the fluid collection. Electronically Signed   By: Corrie Mckusick D.O.   On: 11/02/2017 15:27   Ir Thoracentesis Asp Pleural Space W/img Guide  Result Date: 11/10/2017 INDICATION: Right-sided lung cancer. Right-sided pleural effusion. Shortness of breath. Request for diagnostic and therapeutic thoracentesis.  EXAM: ULTRASOUND GUIDED RIGHT THORACENTESIS MEDICATIONS: None. COMPLICATIONS: None immediate. PROCEDURE: An ultrasound guided thoracentesis was thoroughly discussed with the patient and questions answered. The benefits, risks, alternatives and complications were also discussed. The patient understands and wishes to proceed with the procedure. Written consent was obtained. Ultrasound was performed to localize and mark an adequate pocket of fluid in the right chest. The area was then prepped and draped in the normal sterile fashion. 1% Lidocaine was used for local anesthesia. Under ultrasound guidance a Safe-T-Centesis catheter was introduced. Thoracentesis was performed. The catheter was removed and a dressing applied. FINDINGS: A total of approximately 1.2 L of hazy, dark yellow fluid was removed. Samples were sent to the laboratory as requested by the clinical team. IMPRESSION: Successful ultrasound guided right thoracentesis yielding 1.2 L of pleural fluid. Follow-up chest radiograph shows no pneumothorax. Read by: Ascencion Dike PA-C Electronically Signed   By: Lucrezia Europe M.D.   On: 11/10/2017 10:56    Labs:  CBC: Recent Labs    10/28/17 1242 11/01/17 1105 11/19/17 1950 11/20/17 0456  WBC 8.5 10.9* 18.2* 15.9*  HGB 13.0 13.8 13.8 12.8  HCT 39.6 41.8 42.0 39.5  PLT 346 266 445* 432*    COAGS: Recent Labs    09/06/17 1428 09/09/17 0914 09/24/17 0924 10/02/17 0634  INR 0.88 0.97 0.94 0.87  APTT 29 30 24 24     BMP: Recent Labs    11/19/17 1950 11/20/17 0456 11/21/17 0232 11/21/17 1211  NA 133* 135 134* 131*  K 4.1 3.9 3.3* 3.3*  CL 102 106 100* 96*  CO2 23 21* 25 26  GLUCOSE 180* 186* 217* 223*  BUN 23* 18 13 12   CALCIUM >15.0* 13.9* 12.7* 11.9*  CREATININE 1.15* 0.98 1.14* 1.27*  GFRNONAA 47* 57* 48* 42*  GFRAA 55* >60 55* 48*    LIVER FUNCTION TESTS: Recent Labs    10/28/17 1242 11/01/17 1105 11/19/17 1950 11/21/17 0232 11/21/17 1211  BILITOT 0.43 0.5 0.8  --   0.8  AST 11 19 18   --  38  ALT 8 11* 13*  --  19  ALKPHOS 145 120 151*  --  196*  PROT 7.4 7.1 6.4*  --  6.3*  ALBUMIN 2.9* 3.0* 2.5* 2.4* 2.5*    TUMOR MARKERS: No results for input(s): AFPTM, CEA, CA199, CHROMGRNA in the last 8760 hours.  Assessment and Plan:  Large right pleural effusion with respiratory compromise. Chest tube to follow.  Thank you for this interesting consult.  I greatly enjoyed meeting [name] and look forward to participating in their care.  A copy of this report was sent to the requesting provider on this date.  Electronically Signed: Alejos Reinhardt, ART A, MD 11/21/2017, 4:51 PM   I spent a total of 55 Mi[place]  in face to face in clinical consultation, greater than 50% of which was counseling/coordinating care for chest tube placement.

## 2017-11-21 NOTE — Progress Notes (Signed)
CCMD notified RN that pt rate when into 140's. Pt asymptomatic. BP 109/71. No CP. Rhythm in a. Fib now. Previously in NSR. Cardizem increased to 15. HR now 100-110. MD paged.  Clyde Canterbury, RN

## 2017-11-21 NOTE — Consult Note (Signed)
.. ..  Name: [name] MRN: [id number] DOB: 11-Blessing-1947    ADMISSION DATE:  11/19/2017 CONSULTATION DATE:  11/19/17  REFERRING MD :  ED Physician  CHIEF COMPLAINT: altered mental status  BRIEF PATIENT DESCRIPTION:  71 y.o. 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  SIGNIFICANT EVENTS  Change in mental status Elevated calcium levels on labs Large loculated malignant right pleural effusion   Procedures:  11/10/2017>> Thoracentesis 1.2 L dark hazy yellow fluid Poorly differentiated NSCLC  STUDIES:   11/21/2017 CXR>> Interval enlargement of large loculated right-sided malignant pleural effusions. Diffuse right-sided pleural metastatic disease also appears to be worsening.    12/28>> CTA chest Very rapid progression of metastatic disease along the pleural space at the right hemithorax. Diffuse irregular soft tissue thickening along the entirety of the right-sided pleura, mostly new from the recent prior study, reflecting the patient's underlying malignancy. Significantly enlarging loculated moderate to large right-sided pleural effusion noted, with peripheral soft tissue rind, concerning for malignant effusion. Underlying compressive atelectasis. Given the patient's signs of infection, empyema cannot be excluded. Associated soft tissue components along the periphery of the right-sided pleural effusion abut the mediastinum, concerning for metastatic disease. Irregular nodularity along the anterior pericardium is new from the recent prior study and is also concerning for metastatic disease, though a complex malignant effusion might have a similar appearance. 4.1 cm cavitary lesion at the right lung apex has increased mildly in size, reflecting the patient's malignancy. Enlarged 1.5 cm right  paratracheal node, concerning for metastatic disease.   HISTORY OF PRESENT ILLNESS:  71 y.o. Female former smoker ( quit 8/ 2018 ) with a PMHx of metastatic NSCLC  who presents to the ED via EMS with altered mental status.per daughter the patient has been sick for the past 4 days. Patient has been more sleepy and confused for three days and has a diminished appetite recently.  Earlier on 11/19/17 they found her sweating while in her chair sitting. Also reports chest pain that is reproducible on palpation of anterior and lateral right chest wall described as sharp. Pt also has a mildly productive cough.  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 Most of the history was obtained from her daughter and her brother's verbal account.  SUBJECTIVE:  Awake and alert on BiPAP. States her shortness of breath is better since starting BiPAP and Lasix 80 mg dose.  VITAL SIGNS: Temp:  [97.3 F (36.3 C)-98.5 F (36.9 C)] 98.1 F (36.7 C) (12/30 0743) Pulse Rate:  [68-81] 73 (12/30 1128) Resp:  [19-35] 24 (12/30 1128) BP: (101-148)/(60-71) 120/63 (12/30 1128) SpO2:  [90 %-98 %] 98 % (12/30 1128) Weight:  [218 lb 4.1 oz (99 kg)-218 lb 11.1 oz (99.2 kg)] 218 lb 4.1 oz (99 kg) (12/30 0659)  PHYSICAL EXAMINATION: General:  Obese female supine in bed in BiPAP Neuro:  AAOx3 , MAE x 4, Alert to person and place.Conversant HEENT:  Normocephalic, nasal cannula in nares Cardiovascular:  Irregularly irregular S1 and S2 appreciated, NO RMG Lungs:  Diminished per right side, mild accessory muscle use noted, some exp. Wheezing noted bilateraly, Bilateral excursion noted Abdomen:  Soft obese abdomen, non-tender Musculoskeletal:  No gross deformities Skin:  Grossly intact no evidence of rash  Recent Labs  Lab 11/20/17 0456 11/21/17 0232 11/21/17 1211  NA 135 134* 131*  K 3.9 3.3*  3.3*  CL 106 100* 96*  CO2 21* 25 26  BUN 18 13 12   CREATININE 0.98 1.14* 1.27*  GLUCOSE 186* 217* 223*   Recent Labs  Lab 11/19/17 1950 11/20/17 0456  HGB 13.8 12.8  HCT 42.0 39.5  WBC 18.2* 15.9*  PLT 445* 432*   Dg Chest 2 View  Result Date: 11/19/2017 CLINICAL DATA:  Lethargic EXAM: CHEST  2 VIEW COMPARISON:  11/10/2017, 11/09/2017, 07/05/2017, 11/01/2017 FINDINGS: Re- demonstrated right upper lobe lung mass. Increasing pleural effusion in the right thorax with worsening consolidation at the right middle lobe and lung base. Cardiomediastinal silhouette partially obscured. Aortic atherosclerosis. Left lung is grossly clear. IMPRESSION: 1. Worsening right pleural effusion and airspace consolidation at the right middle lobe and right lung base. 2. Re- demonstrated right upper lobe lung mass. Electronically Signed   By: Donavan Foil M.D.   On: 11/19/2017 20:47   Ct Head Wo Contrast  Result Date: 11/19/2017 CLINICAL DATA:  Acute onset of lethargy and diaphoresis. Altered level of consciousness. EXAM: CT HEAD WITHOUT CONTRAST TECHNIQUE: Contiguous axial images were obtained from the base of the skull through the vertex without intravenous contrast. COMPARISON:  CT of the head performed 10/04/2017 FINDINGS: Brain: The patient's known metastatic lesion at the left superior frontal lobe has decreased slightly in size, measuring approximately 9 mm, with decreased surrounding vasogenic edema. There is no evidence of acute hemorrhage at this time. No evidence of acute infarction, hydrocephalus or extra-axial collection. The brainstem and fourth ventricle are within normal limits. The basal ganglia are unremarkable in appearance. No midline shift is seen. Vascular: No hyperdense vessel or unexpected calcification. Skull: There is no evidence of fracture; visualized osseous structures are unremarkable in appearance. Sinuses/Orbits: The orbits are within normal limits. The paranasal sinuses and mastoid  air cells are well-aerated. Other: No significant soft tissue abnormalities are seen. IMPRESSION: Known metastatic lesion at the left superior frontal lobe has decreased slightly in size, measuring approximately 9 mm, with decreased surrounding vasogenic edema. No evidence of acute hemorrhage at this time. Electronically Signed   By: Garald Balding M.D.   On: 11/19/2017 23:32   Ct Angio Chest Pe W/cm &/or Wo Cm  Result Date: 11/19/2017 CLINICAL DATA:  Acute onset of diaphoresis, loss of appetite and right flank soreness. EXAM: CT ANGIOGRAPHY CHEST WITH CONTRAST TECHNIQUE: Multidetector CT imaging of the chest was performed using the standard protocol during bolus administration of intravenous contrast. Multiplanar CT image reconstructions and MIPs were obtained to evaluate the vascular anatomy. CONTRAST:  11m ISOVUE-370 IOPAMIDOL (ISOVUE-370) INJECTION 76% COMPARISON:  CTA of the chest performed 11/01/2017 FINDINGS: Cardiovascular:  There is no evidence of pulmonary embolus. The heart is normal in size. Scattered coronary artery calcifications are seen. Scattered calcification is noted along the aortic arch and proximal great vessels. Mediastinum/Nodes: There is irregular nodularity along the anterior pericardium, new from the recent prior study. Given the appearance of the right-sided pleural space, this is concerning for either metastatic disease or a complex malignant effusion. Large loculated effusions abut the right side of the mediastinum and fill the azygoesophageal recess, with a soft tissue rind, concerning for metastatic disease. An enlarged 1.5 cm right paratracheal node is concerning for metastatic disease. The visualized portions of the thyroid gland are unremarkable. No axillary lymphadenopathy is seen. Lungs/Pleura: There appears to have  been very rapid progression of metastatic disease along the pleural space at the right hemithorax. Diffuse irregular soft tissue thickening is noted along the  entirety of the right-sided pleura, mostly new from the recent prior study, reflecting the patient's underlying malignancy. The cavitary lesion at the right lung apex has increased in size to 4.1 cm. A significantly enlarging loculated moderate to large right-sided pleural effusion is noted, with peripheral soft tissue rind, concerning for malignant effusion. There is underlying compressive atelectasis. Given the patient's signs of infection, empyema cannot be excluded. The left lung appears clear.  No pneumothorax is seen. Upper Abdomen: The visualized portions of the liver and spleen are unremarkable. The patient is status post cholecystectomy, with clips noted at the gallbladder fossa. The visualized portions of the pancreas and adrenal glands are within normal limits. Musculoskeletal: No acute osseous abnormalities are identified. The visualized musculature is unremarkable in appearance. Review of the MIP images confirms the above findings. IMPRESSION: 1. No evidence of pulmonary embolus. 2. Very rapid progression of metastatic disease along the pleural space at the right hemithorax. Diffuse irregular soft tissue thickening along the entirety of the right-sided pleura, mostly new from the recent prior study, reflecting the patient's underlying malignancy. 3. Significantly enlarging loculated moderate to large right-sided pleural effusion noted, with peripheral soft tissue rind, concerning for malignant effusion. Underlying compressive atelectasis. Given the patient's signs of infection, empyema cannot be excluded. 4. Associated soft tissue components along the periphery of the right-sided pleural effusion abut the mediastinum, concerning for metastatic disease. Irregular nodularity along the anterior pericardium is new from the recent prior study and is also concerning for metastatic disease, though a complex malignant effusion might have a similar appearance. 5. 4.1 cm cavitary lesion at the right lung apex has  increased mildly in size, reflecting the patient's malignancy. 6. Enlarged 1.5 cm right paratracheal node, concerning for metastatic disease. 7. Scattered coronary artery calcifications seen. These results were called by telephone at the time of interpretation on 11/19/2017 at 11:45 pm to [facility], who verbally acknowledged these results. Electronically Signed   By: Garald Balding M.D.   On: 11/19/2017 23:52   Dg Chest Port 1 View  Result Date: 11/21/2017 CLINICAL DATA:  Acute onset of worsening shortness of breath. EXAM: PORTABLE CHEST 1 VIEW COMPARISON:  Chest radiograph and CT of the chest performed 11/19/2017 FINDINGS: There appears to be interval enlargement of the large loculated right-sided malignant pleural effusions. Diffuse right-sided pleural metastatic disease also appears to be worsening. The left lung remains relatively clear. No pneumothorax is seen. The cardiomediastinal silhouette is not well assessed due to the loculated effusions. No acute osseous abnormalities are identified. IMPRESSION: Interval enlargement of large loculated right-sided malignant pleural effusions. Diffuse right-sided pleural metastatic disease also appears to be worsening. Electronically Signed   By: Garald Balding M.D.   On: 11/21/2017 02:26    ASSESSMENT / PLAN: Active Issues:  Right pleural effusion- most likely malignant-Loculated and increasing in size Acute Respiratory Failure  2/2 increasing malignant effusion requiring BiPAP Diuresing well to 80 mg lasix Plan: Titrate oxygen for saturations > 88-92% Continue BiPAP QHS and prn for respiratory distress Lasix for diuresis ( Monitor serum K ) as renal function allows CXR 12/31 IR for CT Guided  therapeutic draining of R loculated effusion Pleural fluid for culture/ cytology to evaluate ?  source of pneumonia    Metastatic NSCLC- brain mets- followed by Oncology she presented to neurology in October 2018 with c/o numbness  in right arm and  leg.Was scheduled for surgical resection ( Dr. 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  Previously on Keppra and decadron. Next MRI due in feb 2019.  Plan: Per oncology Recent biopsy 10/2017>> Dr Earlie Server ordered PDL1 Poorly differentiated NSCLC Goals of care need to be established with patient and family    Hypercalcemia of malignancy- started appropriately on IVF and Zometa Calcium to 11.9 on 12/30 Plan: Trend Calcium Per Primary   Sepsis-  Leukocytosis LA 2.28 initially BP borderline but on evaluation markedly improved  T Max 100.4 12/28 Plan: Monitor fever curve and WBC  Continue Vanc and Zosyn per primary Send pleural fluid for culture     Afib RVR  on Diltiazem  Plan: Tele monitoring  Per Primary  Type II DM-  Plan: monitor BG and start on SSI  .Marland Kitchen       DISPOSITION:  Admit to stepdown  PROGNOSIS: Poor FAMILY: 2 daughters and a brother at bedside and a son and daughter in law were phoned in. We had an extensive conversation regarding her current clinical condition, prognosis, and code status. They expressed that prior to this conversation they were not aware of the gravity of her disease. Daughter stated that she would not want life support if she was at an irreversible state. They intend to have a conversation regarding goals of care and code status amongst each other. CODE STATUS: Full until family can discuss and adjust to align with goals of care.  Magdalen Spatz, AGACNP-BC Pulmonary and Critical Care Medicine Pager [phone]   11/21/2017, 1:52 PM

## 2017-11-21 NOTE — Progress Notes (Signed)
Called by bedside RN to assess patient for increased SOB and WOB.  Per RN, patient is more labored now than she has been, patient did received lasix and is now breathing treatment and patient is still labored.  RT was administering breathing treatment when RN called, I spoke with RT as well, wheezing per RN and RT. I ordered a STAT CXR and then came up to assess the patient.  Per RN, patient has been altered and restless, more so tonight.  Lung sounds diminished R > L, movement air of bilaterally, upper airway wheezing, HR and BP maintaining stable, patient is on Cardizem 10mg , currently in SR. RR in the upper 20s- low 30, + mild use of accessory muscles, saturations maintaining greater than 95% on 2L [place]. CXR - interval enlargement of large loculated right-sided malignant pleural effusions and diffuse right-sided pleural metastatic disease also appears to be worsening.   I had the RN call Dr. Doylene Canard, I informed of him of what I saw, I reviewed the CXR with over the phone, I asked if we could give some more Lasix and maybe trial the patient on BIPAP.  Patient does not appear in overt distress and I did not feel like she would wear the BIPAP given that she is and has confused.  MD ordered Lasix 80mg  IV, patient was repositioned, M[facility]F at [facility], and will follow with what the lasix does.  If needed can trial BIPAP  MD asked if patient needed to be intubated, currently patient is not in any overt distress, mildly labored with breathing is how the patient currently presents.   End Time 305  I called at 400 for and update, per RN, patient has voided 300cc, still somewhat labored with breathing but is now resting with ease.  VS remained stable.  Will follow. Call RRT if needed

## 2017-11-22 ENCOUNTER — Ambulatory Visit: Admission: RE | Admit: 2017-11-22 | Payer: Medicare HMO | Source: Ambulatory Visit | Admitting: Radiation Oncology

## 2017-11-22 ENCOUNTER — Encounter: Payer: Medicare HMO | Admitting: Thoracic Surgery (Cardiothoracic Vascular Surgery)

## 2017-11-22 ENCOUNTER — Inpatient Hospital Stay (HOSPITAL_COMMUNITY): Payer: Medicare HMO

## 2017-11-22 ENCOUNTER — Telehealth: Payer: Self-pay | Admitting: Internal Medicine

## 2017-11-22 ENCOUNTER — Encounter: Payer: Self-pay | Admitting: *Deleted

## 2017-11-22 ENCOUNTER — Other Ambulatory Visit: Payer: Self-pay | Admitting: *Deleted

## 2017-11-22 DIAGNOSIS — C3490 Malignant neoplasm of unspecified part of unspecified bronchus or lung: Secondary | ICD-10-CM

## 2017-11-22 DIAGNOSIS — C7931 Secondary malignant neoplasm of brain: Secondary | ICD-10-CM

## 2017-11-22 LAB — BASIC METABOLIC PANEL
ANION GAP: 10 (ref 5–15)
BUN: 15 mg/dL (ref 6–20)
CALCIUM: 11 mg/dL — AB (ref 8.9–10.3)
CO2: 23 mmol/L (ref 22–32)
CREATININE: 1.55 mg/dL — AB (ref 0.44–1.00)
Chloride: 98 mmol/L — ABNORMAL LOW (ref 101–111)
GFR, EST AFRICAN AMERICAN: 38 mL/min — AB (ref >60)
GFR, EST NON AFRICAN AMERICAN: 33 mL/min — AB (ref >60)
Glucose, Bld: 276 mg/dL — ABNORMAL HIGH (ref 65–99)
Potassium: 3.8 mmol/L (ref 3.5–5.1)
Sodium: 131 mmol/L — ABNORMAL LOW (ref 135–145)

## 2017-11-22 LAB — MAGNESIUM: Magnesium: 1.6 mg/dL — ABNORMAL LOW (ref 1.7–2.4)

## 2017-11-22 MED ORDER — ALBUTEROL SULFATE (2.5 MG/3ML) 0.083% IN NEBU
2.5000 mg | INHALATION_SOLUTION | Freq: Three times a day (TID) | RESPIRATORY_TRACT | Status: DC
  Administered 2017-11-22 – 2017-11-30 (×24): 2.5 mg via RESPIRATORY_TRACT
  Filled 2017-11-22 (×25): qty 3

## 2017-11-22 MED ORDER — HYDROMORPHONE HCL 2 MG PO TABS
2.0000 mg | ORAL_TABLET | Freq: Four times a day (QID) | ORAL | Status: DC | PRN
  Administered 2017-11-22 – 2017-11-26 (×9): 2 mg via ORAL
  Filled 2017-11-22 (×9): qty 1

## 2017-11-22 MED ORDER — DEXTROSE 5 % IV SOLN
2.0000 g | Freq: Every day | INTRAVENOUS | Status: AC
  Administered 2017-11-22 – 2017-11-28 (×7): 2 g via INTRAVENOUS
  Filled 2017-11-22 (×7): qty 2

## 2017-11-22 MED ORDER — DILTIAZEM HCL ER COATED BEADS 120 MG PO CP24
120.0000 mg | ORAL_CAPSULE | Freq: Every day | ORAL | Status: DC
  Administered 2017-11-22 – 2017-11-30 (×9): 120 mg via ORAL
  Filled 2017-11-22 (×10): qty 1

## 2017-11-22 MED ORDER — ORAL CARE MOUTH RINSE
15.0000 mL | Freq: Two times a day (BID) | OROMUCOSAL | Status: DC
  Administered 2017-11-23 – 2017-11-29 (×7): 15 mL via OROMUCOSAL

## 2017-11-22 MED ORDER — AMIODARONE HCL 200 MG PO TABS
200.0000 mg | ORAL_TABLET | Freq: Two times a day (BID) | ORAL | Status: DC
  Administered 2017-11-22 – 2017-11-26 (×9): 200 mg via ORAL
  Filled 2017-11-22 (×9): qty 1

## 2017-11-22 MED ORDER — MAGNESIUM SULFATE 2 GM/50ML IV SOLN
2.0000 g | Freq: Once | INTRAVENOUS | Status: AC
  Administered 2017-11-22: 2 g via INTRAVENOUS
  Filled 2017-11-22: qty 50

## 2017-11-22 NOTE — Telephone Encounter (Signed)
11/22/17 left message with Diane (nurse) asking for Dr. Julien Nordmann to go visit patient who is in hospital in [place] per Dr. Doylene Canard

## 2017-11-22 NOTE — Progress Notes (Signed)
PULMONARY / CRITICAL CARE MEDICINE   Name: [name] MRN: [id number] DOB: Jul 17, 1946    ADMISSION DATE:  11/19/2017   CHIEF COMPLAINT: Altered mental status with hypercalcemia and a loculated right pleural effusion in a patient with metastatic non-small cell lung cancer  HISTORY OF PRESENT ILLNESS:        71 y.o. Female former smoker ( quit 8/ 2018 ) with a PMHx of metastatic NSCLC  who presents to the ED via EMS with altered mental status.per daughter the patient has been sick for the past 4 days. Patient has been more sleepy and confused for three days and has a diminished appetite recently. Earlier on 11/19/17 they found her sweating while in her chair sitting. Also reports chest pain that is reproducible on palpation of anterior and lateral right chest wall described as sharp. Pt also has a mildly productive cough. 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  She is subsequently lethargic and interactive but interactive for me, reports some discomfort in the right chest, but improved dyspnea.  She is no longer requiring BiPAP and is able to speak in complete sentences.    PAST MEDICAL HISTORY :  She  has a past medical history of Anxiety, Arthritis, Cancer ([place]), Chronic back pain greater than 3 months duration, Chronic neck pain, COPD (chronic obstructive pulmonary disease) ([place]), Cough, Diabetes mellitus, Dyspnea, GERD (gastroesophageal reflux disease), H/O hiatal hernia, Headache(784.0), Hemorrhoids, Herniated disc, History of kidney stones, echocardiogram (2010), Hypertension, Palpitations, Shortness of breath on exertion (11/10/11), Vertigo, and Vertigo.  PAST SURGICAL  HISTORY: She  has a past surgical history that includes Knee surgery; Shoulder open rotator cuff repair (07/20/2011); Fracture surgery (2004?); Knee arthroscopy (2004); Tonsillectomy; Cholecystectomy (~ 2008); Abdominal hysterectomy (1997?); neck fusion; Colonoscopy; Anterior cervical decomp/discectomy fusion (N/A, 12/25/2016); Eye surgery; and IR THORACENTESIS ASP PLEURAL SPACE W/IMG GUIDE (11/10/2017).  Allergies  Allergen Reactions  . Oxycodone Hives and Itching    Current Facility-Administered Medications on File Prior to Encounter  Medication  . fentaNYL (SUBLIMAZE) injection  . lactated ringers infusion  . lactated ringers infusion  . midazolam (VERSED) 5 MG/5ML injection   Current Outpatient Medications on File Prior to Encounter  Medication Sig  . acetaminophen (TYLENOL) 500 MG tablet Take 1 tablet (500 mg total) every 6 (six) hours as needed by mouth for fever.  Marland Kitchen albuterol (PROVENTIL) (2.5 MG/3ML) 0.083% nebulizer solution USE 1 VIAL EVERY 6 HOURS AS NEEDED FOR SHORTNESS OF BREATH  . albuterol (VENTOLIN HFA) 108 (90 Base) MCG/ACT inhaler Inhale 2 puffs into the lungs every 6 (six) hours as needed for wheezing or shortness of breath.  . cholecalciferol (VITAMIN D) 1000 units tablet Take 1,000 Units by mouth daily.  Marland Kitchen diltiazem (CARDIZEM) 30 MG tablet Take 30 mg by mouth 3 (three) times daily.  Marland Kitchen esomeprazole (NEXIUM) 40 MG capsule Take 1 capsule (40 mg total) by mouth daily as needed (acid reflux).  Marland Kitchen HYDROmorphone (DILAUDID) 2 MG tablet Take 1 tablet (2 mg total) by mouth every 4 (four) hours as needed for severe pain.  Marland Kitchen JANUMET 50-500 MG tablet Take 1 tablet by mouth 2 (two) times daily.  Marland Kitchen levETIRAcetam (KEPPRA) 500 MG tablet Take 1 tablet (500  mg total) by mouth 2 (two) times daily.  Marland Kitchen losartan (COZAAR) 25 MG tablet Take 1 tablet (25 mg total) by mouth daily.  . meclizine (ANTIVERT) 25 MG tablet Take 1 tablet (25 mg total) by mouth 3 (three) times daily.  . metFORMIN  (GLUCOPHAGE) 500 MG tablet Take 500 mg by mouth 2 (two) times daily with a meal.  . metoprolol succinate (TOPROL-XL) 25 MG 24 hr tablet Take 25 mg by mouth daily.   . polyethylene glycol (MIRALAX / GLYCOLAX) packet Take 17 g daily as needed by mouth for moderate constipation.  . potassium chloride (KLOR-CON M10) 10 MEQ tablet Take 1 tablet (10 mEq total) by mouth daily.  . rosuvastatin (CRESTOR) 10 MG tablet Take 1 tablet (10 mg total) by mouth daily.  Marland Kitchen umeclidinium bromide (INCRUSE ELLIPTA) 62.5 MCG/INH AEPB Inhale 1 puff into the lungs daily.  Marland Kitchen HYDROcodone-acetaminophen (NORCO/VICODIN) 5-325 MG tablet Take 1 tablet by mouth every 4 (four) hours as needed for moderate pain or severe pain (As needed for pain.). (Patient not taking: Reported on 11/12/2017)  . loratadine (CLARITIN) 10 MG tablet Take 10 mg by mouth daily as needed for allergies.  Glory Rosebush VERIO test strip USE AS DIRECTED EVERY DAY  . [DISCONTINUED] budesonide-formoterol (SYMBICORT) 160-4.5 MCG/ACT inhaler Inhale 2 puffs into the lungs 2 (two) times daily. (Patient taking differently: Inhale 2 puffs into the lungs 2 (two) times daily as needed (wheezing). )    FAMILY HISTORY:  Her indicated that her sister is deceased.   SOCIAL HISTORY: She  reports that she quit smoking about 4 months ago. Her smoking use included cigarettes. She has a 7.50 pack-year smoking history. she has never used smokeless tobacco. She reports that she does not drink alcohol or use drugs.   SUBJECTIVE:  As above  VITAL SIGNS: BP 100/66   Pulse 66   Temp 98.3 F (36.8 C) (Oral)   Resp 18   Ht 5\' 7"  (1.702 m)   Wt 218 lb 4.1 oz (99 kg)   SpO2 97%   BMI 34.18 kg/m      VENTILATOR SETTINGS: FiO2 (%):  [30 %-40 %] 40 %  INTAKE / OUTPUT: I/O last 3 completed shifts: In: 2855 [P.O.:300; I.V.:1800; IV Piggyback:755] Out: 7412 [Urine:2800; Chest Tube:2070]  PHYSICAL EXAMINATION: General: She is somewhat lethargic but interacting  appropriately.  She is resting comfortably in bed and in no acute distress. Cardiovascular: S1 and S2 are regular without murmur rub or gallop. Lungs: Respirations are unlabored, there is symmetric air movement anteriorly.  There is serous drainage from the right-sided chest drain Abdomen: The abdomen is obese soft and nontender  LABS:  BMET Recent Labs  Lab 11/21/17 0232 11/21/17 1211 11/22/17 0451  NA 134* 131* 131*  K 3.3* 3.3* 3.8  CL 100* 96* 98*  CO2 25 26 23   BUN 13 12 15   CREATININE 1.14* 1.27* 1.55*  GLUCOSE 217* 223* 276*    Electrolytes Recent Labs  Lab 11/21/17 0232 11/21/17 1211 11/22/17 0451  CALCIUM 12.7* 11.9* 11.0*  MG  --  1.2* 1.6*  PHOS 1.5*  --   --     CBC Recent Labs  Lab 11/19/17 1950 11/20/17 0456  WBC 18.2* 15.9*  HGB 13.8 12.8  HCT 42.0 39.5  PLT 445* 432*    Coag's No results for input(s): APTT, INR in the last 168 hours.  Sepsis Markers Recent Labs  Lab 11/19/17 2016 11/19/17 2215 11/20/17 1141  LATICACIDVEN 2.18* 2.28* 3.4*  ABG No results for input(s): PHART, PCO2ART, PO2ART in the last 168 hours.  Liver Enzymes Recent Labs  Lab 11/19/17 1950 11/21/17 0232 11/21/17 1211  AST 18  --  38  ALT 13*  --  19  ALKPHOS 151*  --  196*  BILITOT 0.8  --  0.8  ALBUMIN 2.5* 2.4* 2.5*    Cardiac Enzymes No results for input(s): TROPONINI, PROBNP in the last 168 hours.  Glucose Recent Labs  Lab 11/19/17 2017 11/19/17 2055  GLUCAP 159* 168*    Imaging Dg Chest Port 1 View  Result Date: 11/22/2017 CLINICAL DATA:  Follow-up right-sided chest tube. EXAM: PORTABLE CHEST 1 VIEW COMPARISON:  Chest x-ray of November 21, 2017 FINDINGS: A small caliber chest tube is been placed at the right lung base. No pneumothorax. There remain peripheral rounded soft tissue densities along the lateral aspect of the right hemithorax most compatible with loculated fluid. There is no mediastinal shift. The left lung is clear. The heart  and pulmonary vascularity are normal. IMPRESSION: Interval placement of a small caliber right-sided chest tube with the pigtail projecting over the posteromedial aspect of the right ninth rib. Pleural based presumed loculated fluid collections persists along the periphery of the right hemithorax. Electronically Signed   By: David  Martinique M.D.   On: 11/22/2017 07:27   Ct Image Guided Fluid Drain By Catheter  Result Date: 11/22/2017 INDICATION: Right pleural effusion EXAM: CT-guided right thoracostomy MEDICATIONS: The patient is currently admitted to the hospital and receiving intravenous antibiotics. The antibiotics were administered within an appropriate time frame prior to the initiation of the procedure. ANESTHESIA/SEDATION: Fentanyl 50 mcg IV; Versed 1 mg IV Moderate Sedation Time:  21 minutes The patient was continuously monitored during the procedure by the interventional radiology nurse under my direct supervision. COMPLICATIONS: None immediate. PROCEDURE: Informed written consent was obtained from the patient after a thorough discussion of the procedural risks, benefits and alternatives. All questions were addressed. Maximal Sterile Barrier Technique was utilized including caps, mask, sterile gowns, sterile gloves, sterile drape, hand hygiene and skin antiseptic. A timeout was performed prior to the initiation of the procedure. The right flank was prepped and draped in a sterile fashion. 1% lidocaine was utilized for local anesthesia. Under CT guidance, an 18 gauge needle was advanced into the right pleural cavity and removed over an Amplatz wire. Twelve [place] dilator followed by a 12 [place] drain were inserted. It was looped and string fixed then sewn to the skin. Clear fluid was aspirated. It was attached to 20 cm water suction. FINDINGS: Imaging confirms placement of a right 35 French pigtail thoracostomy tube. IMPRESSION: Successful right 12 French chest tube. Electronically Signed   By: Marybelle Killings M.D.   On: 11/22/2017 08:19       CULTURES: Pleural fluid culture has not yet been reported  ANTIBIOTICS: Rocephin  SIGNIFICANT EVENTS: Chest drain placed on 12/30  DISCUSSION:     This is a 72 year old with metastatic non-small cell lung cancer, including history of brain metastases with associated seizure activity.  She presented with altered mental status and was found to be hypercalcemic.  In addition she had a right pleural effusion which has been drained and appears to be a exudate with a high white count.  I am awaiting culture results and cytology on that fluid to determine our next step.  I will be repeating a lactate and white count in order to determine whether or not she has responded to our current  therapy suspecting that the collection represents an empyema.  I am keeping in mind that our goal here is palliation.  I like to rescan her chest in 2 days, and if she still has loculation would anticipate using TPA through the chest drain rather than a surgical procedure.  Although not as effective this case it Lesslie be more likely to provide her with a better period of pain-free survival.  Should the effusion proved to be malignant without an infectious component and chest tube drainage remained fairly high we could consider a Pleurx tube for palliation.  Lars Masson, MD Pulmonary and Early Pager: [phone]  11/22/2017, 11:30 AM

## 2017-11-22 NOTE — Progress Notes (Signed)
Ref: Dixie Dials, MD   Subjective:  Feeling better and breathing improved post thoracentesis of right sided pleural effusion. VS stable. Off BiPAP. Clear, straw colored drain in chest tube. Calcium level is down to 11.0.  Objective:  Vital Signs in the last 24 hours: Temp:  [97.7 F (36.5 C)-98.9 F (37.2 C)] 98.3 F (36.8 C) (12/31 0800) Pulse Rate:  [65-144] 66 (12/31 0732) Cardiac Rhythm: Normal sinus rhythm (12/31 0701) Resp:  [16-34] 18 (12/31 0732) BP: (92-122)/(50-89) 100/66 (12/31 0700) SpO2:  [95 %-100 %] 97 % (12/31 0732) FiO2 (%):  [30 %-40 %] 40 % (12/31 0134)  Physical Exam: BP Readings from Last 1 Encounters:  11/22/17 100/66     Wt Readings from Last 1 Encounters:  11/21/17 99 kg (218 lb 4.1 oz)    Weight change:  Body mass index is 34.18 kg/m. HEENT: [place]/AT, Eyes-Brown, PERL, EOMI, Conjunctiva-Pink, Sclera-Non-icteric Neck: No JVD, No bruit, Trachea midline. Lungs:  Clearing, Bilateral. Cardiac:  Regular rhythm, normal S1 and S2, no S3. II/VI systolic murmur. Abdomen:  Soft, non-tender. BS present. Extremities:  No edema present. No cyanosis. No clubbing. CNS: AxOx3, Cranial nerves grossly intact, moves all 4 extremities with right sided weakness..  Skin: Warm and dry.   Intake/Output from previous day: 12/30 0701 - 12/31 0700 In: 1275 [P.O.:120; I.V.:900; IV Piggyback:255] Out: 4166 [Urine:1400; Chest Tube:2070]    Lab Results: BMET    Component Value Date/Time   NA 131 (L) 11/22/2017 0451   NA 131 (L) 11/21/2017 1211   NA 134 (L) 11/21/2017 0232   NA 139 10/28/2017 1242   NA 131 (L) 09/28/2017 1355   K 3.8 11/22/2017 0451   K 3.3 (L) 11/21/2017 1211   K 3.3 (L) 11/21/2017 0232   K 3.8 10/28/2017 1242   K 4.5 09/28/2017 1355   CL 98 (L) 11/22/2017 0451   CL 96 (L) 11/21/2017 1211   CL 100 (L) 11/21/2017 0232   CO2 23 11/22/2017 0451   CO2 26 11/21/2017 1211   CO2 25 11/21/2017 0232   CO2 24 10/28/2017 1242   CO2 23 09/28/2017 1355    GLUCOSE 276 (H) 11/22/2017 0451   GLUCOSE 223 (H) 11/21/2017 1211   GLUCOSE 217 (H) 11/21/2017 0232   GLUCOSE 164 (H) 10/28/2017 1242   GLUCOSE 248 (H) 09/28/2017 1355   BUN 15 11/22/2017 0451   BUN 12 11/21/2017 1211   BUN 13 11/21/2017 0232   BUN 15.4 10/28/2017 1242   BUN 25.7 09/28/2017 1355   CREATININE 1.55 (H) 11/22/2017 0451   CREATININE 1.27 (H) 11/21/2017 1211   CREATININE 1.14 (H) 11/21/2017 0232   CREATININE 1.1 10/28/2017 1242   CREATININE 1.2 (H) 09/28/2017 1355   CALCIUM 11.0 (H) 11/22/2017 0451   CALCIUM 11.9 (H) 11/21/2017 1211   CALCIUM 12.7 (H) 11/21/2017 0232   CALCIUM 10.4 10/28/2017 1242   CALCIUM 9.1 09/28/2017 1355   GFRNONAA 33 (L) 11/22/2017 0451   GFRNONAA 42 (L) 11/21/2017 1211   GFRNONAA 48 (L) 11/21/2017 0232   GFRAA 38 (L) 11/22/2017 0451   GFRAA 48 (L) 11/21/2017 1211   GFRAA 55 (L) 11/21/2017 0232   CBC    Component Value Date/Time   WBC 15.9 (H) 11/20/2017 0456   RBC 4.49 11/20/2017 0456   HGB 12.8 11/20/2017 0456   HGB 13.0 10/28/2017 1242   HCT 39.5 11/20/2017 0456   HCT 39.6 10/28/2017 1242   PLT 432 (H) 11/20/2017 0456   PLT 346 10/28/2017 1242  MCV 88.0 11/20/2017 0456   MCV 89.0 10/28/2017 1242   MCH 28.5 11/20/2017 0456   MCHC 32.4 11/20/2017 0456   RDW 14.0 11/20/2017 0456   RDW 13.8 10/28/2017 1242   LYMPHSABS 3.9 11/19/2017 1950   LYMPHSABS 2.6 10/28/2017 1242   MONOABS 1.3 (H) 11/19/2017 1950   MONOABS 0.8 10/28/2017 1242   EOSABS 0.1 11/19/2017 1950   EOSABS 0.3 10/28/2017 1242   BASOSABS 0.1 11/19/2017 1950   BASOSABS 0.0 10/28/2017 1242   HEPATIC Function Panel Recent Labs    11/01/17 1105 11/19/17 1950 11/21/17 1211  PROT 7.1 6.4* 6.3*   HEMOGLOBIN A1C No components found for: HGA1C,  MPG CARDIAC ENZYMES Lab Results  Component Value Date   CKTOTAL 95 01/01/2009   CKMB 0.8 01/01/2009   TROPONINI <0.01        NO INDICATION OF MYOCARDIAL INJURY. 01/01/2009   TROPONINI <0.01        NO INDICATION  OF MYOCARDIAL INJURY. 01/01/2009   TROPONINI <0.01        NO INDICATION OF MYOCARDIAL INJURY. 12/31/2008   BNP No results for input(s): PROBNP in the last 8760 hours. TSH No results for input(s): TSH in the last 8760 hours. CHOLESTEROL No results for input(s): CHOL in the last 8760 hours.  Scheduled Meds: . albuterol  2.5 mg Nebulization TID  . amiodarone  200 mg Oral BID  . diltiazem  120 mg Oral Daily  . heparin  5,000 Units Subcutaneous Q8H  . sodium chloride flush  5 mL Intravenous Q8H   Continuous Infusions: . levETIRAcetam Stopped (11/21/17 1409)  . magnesium sulfate 1 - 4 g bolus IVPB    . piperacillin-tazobactam (ZOSYN)  IV 3.375 g (11/22/17 0458)  . vancomycin Stopped (11/21/17 2213)   PRN Meds:.acetaminophen, albuterol, fentaNYL, midazolam, sodium chloride flush, traMADol  Assessment/Plan: Acute sepsis Malignant right pleural effusion Obesity Type II DM Non-small cell lung CA with local and distant mets S/P stroke Hypercalcemia of malignancy Hypokalemia-resolved Hyponatremia  DC IV fluids. Increase activity. Change IV diltiazem to PO. Decrease amiodarone dose. Appreciate Dr. Roxan Hockey. He will look into smaller chest tube drain and possible thrombolytic treatment to clear right pleural space adhesions to improve drain.   LOS: 2 days    Dixie Dials  MD  11/22/2017, 10:05 AM

## 2017-11-22 NOTE — Progress Notes (Signed)
22 Spoke with son and [name] who was admitted to [facility] on Friday, 11-19-17 with the following  assessment plan as documented below per notes. Cancelled today's follow up appointment and told her future  appointments will be with Dr. Mickeal Skinner in the medical oncology area.   Mentioned that Dr. Lisbeth Renshaw and Shona Simpson, PA-C will be glad to see her in the future as needed.   ASSESSMENT / PLAN: Active Issues: 1. Metastatic NSCLC- brain mets- followed by Oncology she presented to neurology in October 2018 with c/o numbness in right arm and leg. 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 Previously on Keppra and decadron. Next MRI due in feb 2019. Right pleural effusion- most likely malignant-> recommend diagnostic thoracentesis non emergent 2. Hypercalcemia of malignancy- started appropriately on IVF and Zometa 3. Sepsis- LA 2.28 initially BP borderline but on evaluation markedly improved 112/28mmHg not on vasopressors and hemodynamically stable 4. AMS- pt is AAOx4 she is aware and communicates when asked questions, her words/speech is somewhat garbled but when asked to repeat it improves.  5. Afib RVR on Diltiazem  6. Type II DM- monitor BG and start on ISS

## 2017-11-22 NOTE — Care Management Note (Addendum)
Case Management Note Marvetta Gibbons RN, BSN Unit 4E-Case Manager [phone]  Patient Details  Name: [name] MRN: [id number] Date of Birth: 05-11-1946  Subjective/Objective:    Pt admitted with acute sepsis,  AMS, loculated right pl. Effusion- hx lung CA              Action/Plan: PTA pt lived at home with spouse-  was active with [facility] for HHPT- CM to follow for transition of care needs-   Expected Discharge Date:                  Expected Discharge Plan:  [place]  In-House Referral:  NA  Discharge planning Services  CM Consult  Post Acute Care Choice:  Home Health, Resumption of Svcs/PTA Provider Choice offered to:  Patient  DME Arranged:    DME Agency:     HH Arranged:    [place] Agency:  [place]  Status of Service:  In process, will continue to follow  If discussed at Long Length of Stay Meetings, dates discussed:    Discharge Disposition:   Additional Comments:  Dawayne Patricia, RN 11/22/2017, 2:09 PM

## 2017-11-22 NOTE — Progress Notes (Signed)
Pt has not voided since 1800 bed checked bladder scanned 290 cc will recheck in 2 hours

## 2017-11-22 NOTE — Progress Notes (Signed)
[facility]  Patient Status: Active (receiving services up to time of hospitalization)  [facility] is providing the following services: PT  If patient discharges after hours, please call [phone].   Janae Sauce 11/22/2017, 2:11 PM

## 2017-11-22 NOTE — Progress Notes (Signed)
RT went in patient room to place patient on BIPAP. Patient sleeping at this time. Sats 96% RR 19. Daughter stated to just let patient sleep. RT informed daughter if patient wakes and needs/wants BIPAP inform RN and RN would contact RT.

## 2017-11-22 NOTE — Progress Notes (Signed)
      [name]       [name],[name]             [phone]       Subjective: Informed of patient's admission by Dr. Lake Bells  [name] is a 71 yo woman with stage IV non small cell lung cancer. She was treated for a brain metastasis stereotactic radiation. She had neurologic symptoms necessitating readmission and then rehab. Before she had recovered enough to be considered for surgery she developed a right pleural effusion. Thoracentesis revealed metastatic lung cancer. There is no role for surgical resection.  She was admitted with shortness of breath and was found to have a large right pleural effusion. A drain was placed by IR and per the I/O documentation drained just over 2 liters of fluid. The majority of the fluid was drained but there does appear to some residual loculated fluid in the chest.  Symptomatically improved post drainage  Objective: Vital signs in last 24 hours: Temp:  [97.7 F (36.5 C)-98.9 F (37.2 C)] 97.8 F (36.6 C) (12/31 1321) Pulse Rate:  [64-144] 64 (12/31 1321) Cardiac Rhythm: Normal sinus rhythm (12/31 0830) Resp:  [16-34] 18 (12/31 1321) BP: (92-122)/(50-89) 110/54 (12/31 1321) SpO2:  [95 %-100 %] 97 % (12/31 1326) FiO2 (%):  [40 %] 40 % (12/31 0134)  Hemodynamic parameters for last 24 hours:    Intake/Output from previous day: 12/30 0701 - 12/31 0700 In: 1275 [P.O.:120; I.V.:900; IV Piggyback:255] Out: 8099 [Urine:1400; Chest Tube:2070] Intake/Output this shift: Total I/O In: 120 [P.O.:120] Out: -   General appearance: ill appearing Neurologic: intact Heart: regular rate and rhythm Lungs: diminished breath sounds right base  Lab Results: Recent Labs    11/19/17 1950 11/20/17 0456  WBC 18.2* 15.9*  HGB 13.8 12.8  HCT 42.0 39.5  PLT 445* 432*   BMET:  Recent Labs    11/21/17 1211 11/22/17 0451  NA 131* 131*  K 3.3* 3.8  CL 96* 98*  CO2 26 23  GLUCOSE 223* 276*  BUN 12 15  CREATININE 1.27*  1.55*  CALCIUM 11.9* 11.0*    PT/INR: No results for input(s): LABPROT, INR in the last 72 hours. ABG    Component Value Date/Time   PHART 7.441 09/06/2017 1428   HCO3 21.7 11/19/2017 2215   TCO2 23 11/19/2017 2215   ACIDBASEDEF 3.0 (H) 11/19/2017 2215   O2SAT 88.0 11/19/2017 2215   CBG (last 3)  Recent Labs    11/19/17 2017 11/19/17 2055  GLUCAP 159* 168*    Assessment/Plan: S/P  -71 yo woman with stage IV lung cancer with a malignant right pleural effusion. Cultures from 12/19 thoracentesis were negative, but I don't see any cultures from yesterday. The fluid appears serous and there could be a component of heart failure involved as well.   Discussed with Dr. Lake Bells. It might be worth a trial of thrombolytics to see if any remaining fluid could be evacuated. If no infectious component, then a talc pleurodesis through the drain is a consideration.  Will follow with you   LOS: 2 days    Melrose Nakayama 11/22/2017

## 2017-11-22 NOTE — Progress Notes (Signed)
[id number] Called left a message on the answering machine to call back to [phone] about her follow up appointment today with Shona Simpson, PA-C for Dr. Lisbeth Renshaw at the [facility].  Ask for  nurse Paquita Printy.

## 2017-11-23 DIAGNOSIS — C7931 Secondary malignant neoplasm of brain: Secondary | ICD-10-CM

## 2017-11-23 DIAGNOSIS — Z515 Encounter for palliative care: Secondary | ICD-10-CM

## 2017-11-23 DIAGNOSIS — R0602 Shortness of breath: Secondary | ICD-10-CM

## 2017-11-23 DIAGNOSIS — C3411 Malignant neoplasm of upper lobe, right bronchus or lung: Secondary | ICD-10-CM

## 2017-11-23 LAB — CBC WITH DIFFERENTIAL/PLATELET
BASOS PCT: 0 %
Basophils Absolute: 0 10*3/uL (ref 0.0–0.1)
EOS ABS: 0.2 10*3/uL (ref 0.0–0.7)
EOS PCT: 2 %
HCT: 37.4 % (ref 36.0–46.0)
HEMOGLOBIN: 12.2 g/dL (ref 12.0–15.0)
Lymphocytes Relative: 15 %
Lymphs Abs: 1.9 10*3/uL (ref 0.7–4.0)
MCH: 28.5 pg (ref 26.0–34.0)
MCHC: 32.6 g/dL (ref 30.0–36.0)
MCV: 87.4 fL (ref 78.0–100.0)
Monocytes Absolute: 1.4 10*3/uL — ABNORMAL HIGH (ref 0.1–1.0)
Monocytes Relative: 11 %
NEUTROS PCT: 72 %
Neutro Abs: 9.1 10*3/uL — ABNORMAL HIGH (ref 1.7–7.7)
PLATELETS: 331 10*3/uL (ref 150–400)
RBC: 4.28 MIL/uL (ref 3.87–5.11)
RDW: 14.4 % (ref 11.5–15.5)
WBC: 12.6 10*3/uL — AB (ref 4.0–10.5)

## 2017-11-23 LAB — GLUCOSE, CAPILLARY
GLUCOSE-CAPILLARY: 330 mg/dL — AB (ref 65–99)
GLUCOSE-CAPILLARY: 151 mg/dL — AB (ref 65–99)
Glucose-Capillary: 264 mg/dL — ABNORMAL HIGH (ref 65–99)

## 2017-11-23 LAB — BASIC METABOLIC PANEL
ANION GAP: 8 (ref 5–15)
BUN: 18 mg/dL (ref 6–20)
CALCIUM: 10.8 mg/dL — AB (ref 8.9–10.3)
CO2: 25 mmol/L (ref 22–32)
CREATININE: 1.61 mg/dL — AB (ref 0.44–1.00)
Chloride: 98 mmol/L — ABNORMAL LOW (ref 101–111)
GFR calc Af Amer: 36 mL/min — ABNORMAL LOW (ref >60)
GFR, EST NON AFRICAN AMERICAN: 31 mL/min — AB (ref >60)
Glucose, Bld: 235 mg/dL — ABNORMAL HIGH (ref 65–99)
Potassium: 3.5 mmol/L (ref 3.5–5.1)
SODIUM: 131 mmol/L — AB (ref 135–145)

## 2017-11-23 LAB — LACTIC ACID, PLASMA: Lactic Acid, Venous: 2.5 mmol/L (ref 0.5–1.9)

## 2017-11-23 LAB — MAGNESIUM: MAGNESIUM: 2.1 mg/dL (ref 1.7–2.4)

## 2017-11-23 MED ORDER — DIPHENHYDRAMINE HCL 25 MG PO CAPS
25.0000 mg | ORAL_CAPSULE | Freq: Four times a day (QID) | ORAL | Status: DC
  Administered 2017-11-23 – 2017-11-26 (×9): 25 mg via ORAL
  Filled 2017-11-23 (×9): qty 1

## 2017-11-23 MED ORDER — INSULIN ASPART 100 UNIT/ML ~~LOC~~ SOLN
4.0000 [IU] | Freq: Three times a day (TID) | SUBCUTANEOUS | Status: DC
  Administered 2017-11-24 – 2017-11-25 (×4): 4 [IU] via SUBCUTANEOUS

## 2017-11-23 MED ORDER — INSULIN GLARGINE 100 UNIT/ML ~~LOC~~ SOLN
10.0000 [IU] | Freq: Every day | SUBCUTANEOUS | Status: DC
  Administered 2017-11-23 – 2017-11-24 (×2): 10 [IU] via SUBCUTANEOUS
  Filled 2017-11-23 (×3): qty 0.1

## 2017-11-23 MED ORDER — HYDROCORTISONE 1 % EX CREA
TOPICAL_CREAM | Freq: Two times a day (BID) | CUTANEOUS | Status: DC | PRN
  Filled 2017-11-23: qty 28

## 2017-11-23 MED ORDER — INSULIN ASPART 100 UNIT/ML ~~LOC~~ SOLN
0.0000 [IU] | Freq: Three times a day (TID) | SUBCUTANEOUS | Status: DC
  Administered 2017-11-23: 3 [IU] via SUBCUTANEOUS
  Administered 2017-11-23: 11 [IU] via SUBCUTANEOUS
  Administered 2017-11-24: 5 [IU] via SUBCUTANEOUS
  Administered 2017-11-24: 3 [IU] via SUBCUTANEOUS
  Administered 2017-11-24 – 2017-11-25 (×2): 5 [IU] via SUBCUTANEOUS
  Administered 2017-11-25 – 2017-12-01 (×11): 2 [IU] via SUBCUTANEOUS

## 2017-11-23 NOTE — Progress Notes (Addendum)
Critical lactic acid 2.5 down from 4.3 on 29 dec MD on the phone, spoke with Abby elink RN who expressed she let the MD know when off phone VSS will continue to monitor

## 2017-11-23 NOTE — Progress Notes (Signed)
Subjective: The patient seen and examined today.  Her daughter and granddaughter were at the bedside.  This is a very pleasant 72 years old African-American female diagnosed with stage IV non-small cell lung cancer, poorly differentiated carcinoma presented with right upper lobe lung nodule in addition to solitary brain metastasis status post stereotactic radiotherapy to the solitary brain lesion on September 30, 2017.  The patient was expected to have surgical resection for the right upper lobe pulmonary nodule but unfortunately she developed right-sided pleural effusion and the cytology was positive for malignancy.  She was admitted to [facility] on November 20, 2018 with mental status change and increasing fatigue and confusion.  CT scan of the chest was positive for right-sided pleural effusion with suspicious pneumonia/empyema.  She also had hypercalcemia and leukocytosis.  The patient was admitted to the hospital and has right-sided chest tube placed by interventional radiology on November 21, 2017.  She is feeling a little bit better today.  She continues to have shortness of breath but denied having any fever or chills.  Objective: Vital signs in last 24 hours: Temp:  [97.6 F (36.4 C)-98.4 F (36.9 C)] 98 F (36.7 C) (01/01 1130) Pulse Rate:  [66-77] 77 (01/01 1200) Resp:  [15-24] 15 (01/01 1200) BP: (114-152)/(58-69) 114/67 (01/01 1200) SpO2:  [95 %-100 %] 97 % (01/01 1332) Weight:  [219 lb 2.2 oz (99.4 kg)] 219 lb 2.2 oz (99.4 kg) (01/01 0347)  Intake/Output from previous day: 12/31 0701 - 01/01 0700 In: 875 [P.O.:720; IV Piggyback:155] Out: 695 [Urine:525; Chest Tube:170] Intake/Output this shift: Total I/O In: 480 [P.O.:480] Out: 600 [Urine:600]  General appearance: alert, cooperative, fatigued and no distress Resp: diminished breath sounds RLL and dullness to percussion RLL Cardio: regular rate and rhythm, S1, S2 normal, no murmur, click, rub or gallop GI: soft,  non-tender; bowel sounds normal; no masses,  no organomegaly Extremities: extremities normal, atraumatic, no cyanosis or edema  Lab Results:  Recent Labs    11/23/17 0327  WBC 12.6*  HGB 12.2  HCT 37.4  PLT 331   BMET Recent Labs    11/22/17 0451 11/23/17 0327  NA 131* 131*  K 3.8 3.5  CL 98* 98*  CO2 23 25  GLUCOSE 276* 235*  BUN 15 18  CREATININE 1.55* 1.61*  CALCIUM 11.0* 10.8*    Studies/Results: Dg Chest Port 1 View  Result Date: 11/22/2017 CLINICAL DATA:  Follow-up right-sided chest tube. EXAM: PORTABLE CHEST 1 VIEW COMPARISON:  Chest x-ray of November 21, 2017 FINDINGS: A small caliber chest tube is been placed at the right lung base. No pneumothorax. There remain peripheral rounded soft tissue densities along the lateral aspect of the right hemithorax most compatible with loculated fluid. There is no mediastinal shift. The left lung is clear. The heart and pulmonary vascularity are normal. IMPRESSION: Interval placement of a small caliber right-sided chest tube with the pigtail projecting over the posteromedial aspect of the right ninth rib. Pleural based presumed loculated fluid collections persists along the periphery of the right hemithorax. Electronically Signed   By: David  Martinique M.D.   On: 11/22/2017 07:27   Ct Image Guided Fluid Drain By Catheter  Result Date: 11/22/2017 INDICATION: Right pleural effusion EXAM: CT-guided right thoracostomy MEDICATIONS: The patient is currently admitted to the hospital and receiving intravenous antibiotics. The antibiotics were administered within an appropriate time frame prior to the initiation of the procedure. ANESTHESIA/SEDATION: Fentanyl 50 mcg IV; Versed 1 mg IV Moderate Sedation Time:  21  minutes The patient was continuously monitored during the procedure by the interventional radiology nurse under my direct supervision. COMPLICATIONS: None immediate. PROCEDURE: Informed written consent was obtained from the patient after a  thorough discussion of the procedural risks, benefits and alternatives. All questions were addressed. Maximal Sterile Barrier Technique was utilized including caps, mask, sterile gowns, sterile gloves, sterile drape, hand hygiene and skin antiseptic. A timeout was performed prior to the initiation of the procedure. The right flank was prepped and draped in a sterile fashion. 1% lidocaine was utilized for local anesthesia. Under CT guidance, an 18 gauge needle was advanced into the right pleural cavity and removed over an Amplatz wire. Twelve [place] dilator followed by a 12 [place] drain were inserted. It was looped and string fixed then sewn to the skin. Clear fluid was aspirated. It was attached to 20 cm water suction. FINDINGS: Imaging confirms placement of a right 76 French pigtail thoracostomy tube. IMPRESSION: Successful right 12 French chest tube. Electronically Signed   By: Marybelle Killings M.D.   On: 11/22/2017 08:19    Medications: I have reviewed the patient's current medications.  Assessment/Plan: This is a very pleasant 72 years old African-American female with metastatic poorly differentiated non-small cell lung cancer. I had a lengthy discussion with the patient and her family today about her condition.  I explained to the patient at the surgical option for the right upper lobe lung nodule is not a valid option at this point especially after she was found to have positive pleural fluid for malignancy. I discussed with the patient other options for management of her condition including palliative care versus consideration of palliative systemic chemotherapy after discharge from the hospital and improvement of her current condition. The patient is a still interested in proceeding with treatment. I will schedule her an appointment to see me at the cancer center within 1-2 weeks from discharge for more discussion of her systemic treatment options.  For the recurrent right pleural effusion, the  patient will continue drainage via the chest tube. For the empyema, she will continue her current treatment with Rocephin under the care of Dr. Doylene Canard. For the hypercalcemia, this is significantly improved with the IV hydration. Thank you for taking good care of Ms. Coralyn Mark, I will continue to follow-up the patient with you on as-needed basis.  Please call if you have any questions.   LOS: 3 days    Eilleen Kempf 11/23/2017

## 2017-11-23 NOTE — Progress Notes (Signed)
Ref: Dixie Dials, MD   Subjective:  Feeling same. Right sided chest pain continues. Afebrile.  Objective:  Vital Signs in the last 24 hours: Temp:  [97.6 F (36.4 C)-98.4 F (36.9 C)] 98 F (36.7 C) (01/01 1130) Pulse Rate:  [64-74] 71 (01/01 1130) Cardiac Rhythm: Normal sinus rhythm (01/01 0700) Resp:  [16-24] 18 (01/01 1130) BP: (110-152)/(54-69) 128/58 (01/01 1130) SpO2:  [95 %-100 %] 99 % (01/01 1130) Weight:  [99.4 kg (219 lb 2.2 oz)] 99.4 kg (219 lb 2.2 oz) (01/01 0347)  Physical Exam: BP Readings from Last 1 Encounters:  11/23/17 (!) 128/58     Wt Readings from Last 1 Encounters:  11/23/17 99.4 kg (219 lb 2.2 oz)    Weight change:  Body mass index is 34.32 kg/m. HEENT: [place]/AT, Eyes-Brown, PERL, EOMI, Conjunctiva-Pink, Sclera-Non-icteric Neck: No JVD, No bruit, Trachea midline. Lungs:  Clearing, Bilateral. Right sided chest pain. Cardiac:  Regular rhythm, normal S1 and S2, no S3. II/VI systolic murmur. Abdomen:  Soft, non-tender. BS present. Extremities:  No edema present. No cyanosis. No clubbing. CNS: AxOx3, Cranial nerves grossly intact, moves all 4 extremities.  Skin: Warm and dry.   Intake/Output from previous day: 12/31 0701 - 01/01 0700 In: 875 [P.O.:720; IV Piggyback:155] Out: 695 [Urine:525; Chest Tube:170]    Lab Results: BMET    Component Value Date/Time   NA 131 (L) 11/23/2017 0327   NA 131 (L) 11/22/2017 0451   NA 131 (L) 11/21/2017 1211   NA 139 10/28/2017 1242   NA 131 (L) 09/28/2017 1355   K 3.5 11/23/2017 0327   K 3.8 11/22/2017 0451   K 3.3 (L) 11/21/2017 1211   K 3.8 10/28/2017 1242   K 4.5 09/28/2017 1355   CL 98 (L) 11/23/2017 0327   CL 98 (L) 11/22/2017 0451   CL 96 (L) 11/21/2017 1211   CO2 25 11/23/2017 0327   CO2 23 11/22/2017 0451   CO2 26 11/21/2017 1211   CO2 24 10/28/2017 1242   CO2 23 09/28/2017 1355   GLUCOSE 235 (H) 11/23/2017 0327   GLUCOSE 276 (H) 11/22/2017 0451   GLUCOSE 223 (H) 11/21/2017 1211   GLUCOSE  164 (H) 10/28/2017 1242   GLUCOSE 248 (H) 09/28/2017 1355   BUN 18 11/23/2017 0327   BUN 15 11/22/2017 0451   BUN 12 11/21/2017 1211   BUN 15.4 10/28/2017 1242   BUN 25.7 09/28/2017 1355   CREATININE 1.61 (H) 11/23/2017 0327   CREATININE 1.55 (H) 11/22/2017 0451   CREATININE 1.27 (H) 11/21/2017 1211   CREATININE 1.1 10/28/2017 1242   CREATININE 1.2 (H) 09/28/2017 1355   CALCIUM 10.8 (H) 11/23/2017 0327   CALCIUM 11.0 (H) 11/22/2017 0451   CALCIUM 11.9 (H) 11/21/2017 1211   CALCIUM 10.4 10/28/2017 1242   CALCIUM 9.1 09/28/2017 1355   GFRNONAA 31 (L) 11/23/2017 0327   GFRNONAA 33 (L) 11/22/2017 0451   GFRNONAA 42 (L) 11/21/2017 1211   GFRAA 36 (L) 11/23/2017 0327   GFRAA 38 (L) 11/22/2017 0451   GFRAA 48 (L) 11/21/2017 1211   CBC    Component Value Date/Time   WBC 12.6 (H) 11/23/2017 0327   RBC 4.28 11/23/2017 0327   HGB 12.2 11/23/2017 0327   HGB 13.0 10/28/2017 1242   HCT 37.4 11/23/2017 0327   HCT 39.6 10/28/2017 1242   PLT 331 11/23/2017 0327   PLT 346 10/28/2017 1242   MCV 87.4 11/23/2017 0327   MCV 89.0 10/28/2017 1242   MCH 28.5 11/23/2017 0327  MCHC 32.6 11/23/2017 0327   RDW 14.4 11/23/2017 0327   RDW 13.8 10/28/2017 1242   LYMPHSABS 1.9 11/23/2017 0327   LYMPHSABS 2.6 10/28/2017 1242   MONOABS 1.4 (H) 11/23/2017 0327   MONOABS 0.8 10/28/2017 1242   EOSABS 0.2 11/23/2017 0327   EOSABS 0.3 10/28/2017 1242   BASOSABS 0.0 11/23/2017 0327   BASOSABS 0.0 10/28/2017 1242   HEPATIC Function Panel Recent Labs    11/01/17 1105 11/19/17 1950 11/21/17 1211  PROT 7.1 6.4* 6.3*   HEMOGLOBIN A1C No components found for: HGA1C,  MPG CARDIAC ENZYMES Lab Results  Component Value Date   CKTOTAL 95 01/01/2009   CKMB 0.8 01/01/2009   TROPONINI <0.01        NO INDICATION OF MYOCARDIAL INJURY. 01/01/2009   TROPONINI <0.01        NO INDICATION OF MYOCARDIAL INJURY. 01/01/2009   TROPONINI <0.01        NO INDICATION OF MYOCARDIAL INJURY. 12/31/2008   BNP No  results for input(s): PROBNP in the last 8760 hours. TSH No results for input(s): TSH in the last 8760 hours. CHOLESTEROL No results for input(s): CHOL in the last 8760 hours.  Scheduled Meds: . albuterol  2.5 mg Nebulization TID  . amiodarone  200 mg Oral BID  . diltiazem  120 mg Oral Daily  . heparin  5,000 Units Subcutaneous Q8H  . insulin aspart  0-15 Units Subcutaneous TID WC  . insulin aspart  4 Units Subcutaneous TID WC  . insulin glargine  10 Units Subcutaneous QHS  . mouth rinse  15 mL Mouth Rinse BID  . sodium chloride flush  5 mL Intravenous Q8H   Continuous Infusions: . cefTRIAXone (ROCEPHIN)  IV 2 g (11/23/17 0926)  . levETIRAcetam 500 mg (11/23/17 0926)   PRN Meds:.acetaminophen, albuterol, fentaNYL, hydrocortisone cream, HYDROmorphone, midazolam, sodium chloride flush  Assessment/Plan: Acute sepsis Malignant right pleural effusion Non-small cell lung CA with mets. S/P stroke Hypercalcemia of malignancy Obesity Type II DM   LOS: 3 days    Dixie Dials  MD  11/23/2017, 11:45 AM

## 2017-11-23 NOTE — Consult Note (Signed)
Consultation Note Date: 11/23/2017   Patient Name: [name]  DOB: [birth year]  MRN: [id number]  Age / Sex: 72 y.o., female  PCP: Dixie Dials, MD Referring Physician: Dixie Dials, MD  Reason for Consultation: Establishing goals of care and Psychosocial/spiritual support  HPI/Patient Profile: 72 y.o. female admitted on 11/19/2017 with  a recent diagnosis of non-small cell lung CA with metastasis to brain/Stereotactic radiation to the brain lesion on 09/30/2017.    Admitted with increased lethargy and confusion.   Her chest x-ray and CT of chest is positive for right sided pleural effusion with possible pneumonia/empyema.    Her WBC count is elevated and her calcium level is high at over 15 mg/dl and treated with IV fluids. Venous lactic acid is over 2. She also has atrial fibrillation with RVR   Admitted for treatment  and stabilization  She faces treatment option decisions, the reality of this serious diagnosis, advanced directive decisions and anticipatory care needs.   Clinical Assessment and Goals of Care:  This NP Wadie Lessen reviewed medical records, received report from team,  and then introduced myself and the concept of palliative medicine into a holistic treatment plan.  Her husband and son/Jermain was present at bedside.   Initially plan was to schedule a meeting with her several family members for later today or tomorrow but then the patient reconsidered and shared with me her concerns regarding involving palliative care.  She tells me some of her family members are unaware of her newly diagnosed cancer, "they will not be able to handle this"  I asked permission to take a few minutes with her and her husband to just talk and she agreed.  I educated them that Palliative medicine was meant to be an extra layer of support for patient and the families of people with serious life limiting  illness.  The role  Of PM is ultimately to assist the patient in achieving her GOCS and assist with symptom management.   Values and goals of care important to patient and family were attempted to be elicited.   Questions and concerns addressed.   Family encouraged to call with questions or concerns.   PMT will continue to support holistically.   PATIENT- no documented HPOA-     SUMMARY OF RECOMMENDATIONS    Code Status/Advance Care Planning:  Full code  Additional Recommendations (Limitations, Scope, Preferences):  Full Scope Treatment-she is open to all offered and available medical interventions to prolong life  Psycho-social/Spiritual:   Desire for further Chaplaincy support:yes  Prognosis:   Unable to determine  Discharge Planning: To Be Determined      Primary Diagnoses: Present on Admission: . Acute sepsis ([place])   I have reviewed the medical record, interviewed the patient and family, and examined the patient. The following aspects are pertinent.  Past Medical History:  Diagnosis Date  . Anxiety   . Arthritis   . Cancer ([place])   . Chronic back pain greater than 3 months duration   . Chronic neck pain   .  COPD (chronic obstructive pulmonary disease) ([place])   . Cough   . Diabetes mellitus   . Dyspnea   . GERD (gastroesophageal reflux disease)   . H/O hiatal hernia   . Headache(784.0)   . Hemorrhoids   . Herniated disc   . History of kidney stones   . Hx of echocardiogram 2010   normal EF  . Hypertension   . Palpitations   . Shortness of breath on exertion 11/10/11   "sometimes"  . Vertigo   . Vertigo    Social History   Socioeconomic History  . Marital status: Married    Spouse name: None  . Number of children: None  . Years of education: None  . Highest education level: None  Social Needs  . Financial resource strain: None  . Food insecurity - worry: None  . Food insecurity - inability: None  . Transportation needs - medical: None  .  Transportation needs - non-medical: None  Occupational History  . None  Tobacco Use  . Smoking status: Former Smoker    Packs/day: 0.25    Years: 30.00    Pack years: 7.50    Types: Cigarettes    Last attempt to quit: 07/08/2017    Years since quitting: 0.3  . Smokeless tobacco: Never Used  . Tobacco comment: 4-5 cigs / day  Substance and Sexual Activity  . Alcohol use: No  . Drug use: No  . Sexual activity: No  Other Topics Concern  . None  Social History Narrative  . None   Family History  Problem Relation Age of Onset  . Lung cancer Sister    Scheduled Meds: . albuterol  2.5 mg Nebulization TID  . amiodarone  200 mg Oral BID  . diltiazem  120 mg Oral Daily  . diphenhydrAMINE  25 mg Oral QID  . heparin  5,000 Units Subcutaneous Q8H  . insulin aspart  0-15 Units Subcutaneous TID WC  . insulin aspart  4 Units Subcutaneous TID WC  . insulin glargine  10 Units Subcutaneous QHS  . mouth rinse  15 mL Mouth Rinse BID  . sodium chloride flush  5 mL Intravenous Q8H   Continuous Infusions: . cefTRIAXone (ROCEPHIN)  IV 2 g (11/23/17 0926)  . levETIRAcetam 500 mg (11/23/17 0926)   PRN Meds:.acetaminophen, albuterol, fentaNYL, hydrocortisone cream, HYDROmorphone, midazolam, sodium chloride flush Medications Prior to Admission:  Prior to Admission medications   Medication Sig Start Date End Date Taking? Authorizing Provider  acetaminophen (TYLENOL) 500 MG tablet Take 1 tablet (500 mg total) every 6 (six) hours as needed by mouth for fever. 10/11/17  Yes Dixie Dials, MD  albuterol (PROVENTIL) (2.5 MG/3ML) 0.083% nebulizer solution USE 1 VIAL EVERY 6 HOURS AS NEEDED FOR SHORTNESS OF BREATH 10/15/15  Yes [provider]  albuterol (VENTOLIN HFA) 108 (90 Base) MCG/ACT inhaler Inhale 2 puffs into the lungs every 6 (six) hours as needed for wheezing or shortness of breath.   Yes [provider]  cholecalciferol (VITAMIN D) 1000 units tablet Take 1,000 Units by mouth  daily.   Yes [provider]  diltiazem (CARDIZEM) 30 MG tablet Take 30 mg by mouth 3 (three) times daily. 11/03/17  Yes [provider]  esomeprazole (NEXIUM) 40 MG capsule Take 1 capsule (40 mg total) by mouth daily as needed (acid reflux). 10/22/17  Yes Angiulli, Lavon Paganini, PA-C  HYDROmorphone (DILAUDID) 2 MG tablet Take 1 tablet (2 mg total) by mouth every 4 (four) hours as needed for  severe pain. 11/18/17  Yes Grace Isaac, MD  JANUMET 50-500 MG tablet Take 1 tablet by mouth 2 (two) times daily. 09/16/17  Yes [provider]  levETIRAcetam (KEPPRA) 500 MG tablet Take 1 tablet (500 mg total) by mouth 2 (two) times daily. 10/22/17  Yes Angiulli, Lavon Paganini, PA-C  losartan (COZAAR) 25 MG tablet Take 1 tablet (25 mg total) by mouth daily. 10/22/17  Yes Angiulli, Lavon Paganini, PA-C  meclizine (ANTIVERT) 25 MG tablet Take 1 tablet (25 mg total) by mouth 3 (three) times daily. 10/22/17  Yes Angiulli, Lavon Paganini, PA-C  metFORMIN (GLUCOPHAGE) 500 MG tablet Take 500 mg by mouth 2 (two) times daily with a meal.   Yes [provider]  metoprolol succinate (TOPROL-XL) 25 MG 24 hr tablet Take 25 mg by mouth daily.  11/17/17  Yes [provider]  polyethylene glycol (MIRALAX / GLYCOLAX) packet Take 17 g daily as needed by mouth for moderate constipation. 10/11/17  Yes Dixie Dials, MD  potassium chloride (KLOR-CON M10) 10 MEQ tablet Take 1 tablet (10 mEq total) by mouth daily. 10/22/17  Yes Angiulli, Lavon Paganini, PA-C  rosuvastatin (CRESTOR) 10 MG tablet Take 1 tablet (10 mg total) by mouth daily. 10/22/17  Yes Angiulli, Lavon Paganini, PA-C  umeclidinium bromide (INCRUSE ELLIPTA) 62.5 MCG/INH AEPB Inhale 1 puff into the lungs daily. 10/22/17  Yes Angiulli, Lavon Paganini, PA-C  HYDROcodone-acetaminophen (NORCO/VICODIN) 5-325 MG tablet Take 1 tablet by mouth every 4 (four) hours as needed for moderate pain or severe pain (As needed for pain.). Patient not taking: Reported on  11/12/2017 10/22/17   Angiulli, Lavon Paganini, PA-C  loratadine (CLARITIN) 10 MG tablet Take 10 mg by mouth daily as needed for allergies.    [provider]  [facility] VERIO test strip USE AS DIRECTED EVERY DAY 09/13/17   [provider]   Allergies  Allergen Reactions  . Oxycodone Hives and Itching  . Tramadol Hcl Itching   Review of Systems  Constitutional: Positive for fatigue.  Respiratory: Positive for shortness of breath.   Neurological: Positive for weakness.    Physical Exam  Constitutional: She appears well-developed.  Generalized weakness  Cardiovascular: Normal rate, regular rhythm and normal heart sounds.  Pulmonary/Chest: She has decreased breath sounds.  Chest tube noted    Vital Signs: BP (!) 128/58 (BP Location: Right Arm)   Pulse 71   Temp 98 F (36.7 C) (Oral)   Resp 18   Ht 5\' 7"  (1.702 m)   Wt 99.4 kg (219 lb 2.2 oz)   SpO2 99%   BMI 34.32 kg/m  Pain Assessment: 0-10   Pain Score: Asleep   SpO2: SpO2: 99 % O2 Device:SpO2: 99 % O2 Flow Rate: .O2 Flow Rate (L/min): 2 L/min  IO: Intake/output summary:   Intake/Output Summary (Last 24 hours) at 11/23/2017 1223 Last data filed at 11/23/2017 0800 Gross per 24 hour  Intake 1010 ml  Output 695 ml  Net 315 ml    LBM: Last BM Date: (patient not sure when last bm) Baseline Weight: Weight: 99.2 kg (218 lb 11.1 oz) Most recent weight: Weight: 99.4 kg (219 lb 2.2 oz)     Palliative Assessment/Data: 30 % at best     Time In: 1300 Time Out: 1400 Time Total: 60 minutes Greater than 50%  of this time was spent counseling and coordinating care related to the above assessment and plan.  Signed by: Wadie Lessen, NP   Please contact Palliative Medicine Team phone at  [phone] for questions and concerns.  For individual provider: See Shea Evans

## 2017-11-23 NOTE — Progress Notes (Signed)
[facility]  Chart reviewed Patient seen briefly Seems to be breathing better with pigtail in place Still weak  Reviewed pleural fluid cell count and chemistries: High WBC count but mostly lymphocytes and normal glucose> not consistent with an active infection, more consistent with cancer (this is a known malignant effusion).  Rec: Agree to perform repeat CT chest tomorrow If persistent loculations in fluid then trial of TPA is reasonable but I've never done this for a malignant effusion before Agree with palliative care consultation  Roselie Awkward, MD [facility] Pager: [phone] Cell: [phone] After 3pm or if no response, call [phone]

## 2017-11-24 ENCOUNTER — Inpatient Hospital Stay (HOSPITAL_COMMUNITY): Payer: Medicare HMO

## 2017-11-24 DIAGNOSIS — Z515 Encounter for palliative care: Secondary | ICD-10-CM

## 2017-11-24 DIAGNOSIS — J90 Pleural effusion, not elsewhere classified: Secondary | ICD-10-CM

## 2017-11-24 DIAGNOSIS — J9601 Acute respiratory failure with hypoxia: Secondary | ICD-10-CM

## 2017-11-24 LAB — BASIC METABOLIC PANEL
Anion gap: 7 (ref 5–15)
BUN: 18 mg/dL (ref 6–20)
CALCIUM: 10.2 mg/dL (ref 8.9–10.3)
CHLORIDE: 100 mmol/L — AB (ref 101–111)
CO2: 23 mmol/L (ref 22–32)
CREATININE: 1.65 mg/dL — AB (ref 0.44–1.00)
GFR calc non Af Amer: 30 mL/min — ABNORMAL LOW (ref >60)
GFR, EST AFRICAN AMERICAN: 35 mL/min — AB (ref >60)
Glucose, Bld: 233 mg/dL — ABNORMAL HIGH (ref 65–99)
Potassium: 3.7 mmol/L (ref 3.5–5.1)
SODIUM: 130 mmol/L — AB (ref 135–145)

## 2017-11-24 LAB — CULTURE, BLOOD (ROUTINE X 2)
CULTURE: NO GROWTH
Culture: NO GROWTH
Special Requests: ADEQUATE

## 2017-11-24 LAB — GLUCOSE, CAPILLARY
GLUCOSE-CAPILLARY: 215 mg/dL — AB (ref 65–99)
GLUCOSE-CAPILLARY: 179 mg/dL — AB (ref 65–99)
Glucose-Capillary: 215 mg/dL — ABNORMAL HIGH (ref 65–99)
Glucose-Capillary: 194 mg/dL — ABNORMAL HIGH (ref 65–99)

## 2017-11-24 MED ORDER — ALUM & MAG HYDROXIDE-SIMETH 200-200-20 MG/5ML PO SUSP
30.0000 mL | Freq: Four times a day (QID) | ORAL | Status: DC | PRN
  Administered 2017-11-24 – 2017-11-25 (×2): 30 mL via ORAL
  Filled 2017-11-24 (×2): qty 30

## 2017-11-24 MED ORDER — METFORMIN HCL 500 MG PO TABS
500.0000 mg | ORAL_TABLET | Freq: Two times a day (BID) | ORAL | Status: DC
  Administered 2017-11-24 – 2017-12-01 (×15): 500 mg via ORAL
  Filled 2017-11-24 (×16): qty 1

## 2017-11-24 MED ORDER — FUROSEMIDE 40 MG PO TABS
40.0000 mg | ORAL_TABLET | Freq: Every day | ORAL | Status: DC
  Administered 2017-11-24 – 2017-12-01 (×8): 40 mg via ORAL
  Filled 2017-11-24 (×8): qty 1

## 2017-11-24 MED ORDER — UMECLIDINIUM BROMIDE 62.5 MCG/INH IN AEPB
1.0000 | INHALATION_SPRAY | Freq: Every day | RESPIRATORY_TRACT | Status: DC
  Administered 2017-11-24 – 2017-12-01 (×6): 1 via RESPIRATORY_TRACT
  Filled 2017-11-24: qty 7

## 2017-11-24 MED ORDER — METOPROLOL SUCCINATE ER 25 MG PO TB24
25.0000 mg | ORAL_TABLET | Freq: Every day | ORAL | Status: DC
  Administered 2017-11-24 – 2017-11-30 (×7): 25 mg via ORAL
  Filled 2017-11-24 (×8): qty 1

## 2017-11-24 MED ORDER — PANTOPRAZOLE SODIUM 40 MG PO TBEC
40.0000 mg | DELAYED_RELEASE_TABLET | Freq: Every day | ORAL | Status: DC
  Administered 2017-11-24 – 2017-11-30 (×7): 40 mg via ORAL
  Filled 2017-11-24 (×7): qty 1

## 2017-11-24 MED ORDER — POTASSIUM CHLORIDE CRYS ER 10 MEQ PO TBCR
10.0000 meq | EXTENDED_RELEASE_TABLET | Freq: Every day | ORAL | Status: DC
  Administered 2017-11-24 – 2017-11-30 (×7): 10 meq via ORAL
  Filled 2017-11-24 (×12): qty 1

## 2017-11-24 MED ORDER — LEVETIRACETAM 500 MG PO TABS
500.0000 mg | ORAL_TABLET | Freq: Two times a day (BID) | ORAL | Status: DC
  Administered 2017-11-24 – 2017-12-01 (×14): 500 mg via ORAL
  Filled 2017-11-24 (×15): qty 1

## 2017-11-24 NOTE — Progress Notes (Signed)
Pt having c/o of chest pressure and SOB. EKG obtained. Stats 100% on 2L O2. MD notified. Verbal order given for Protonix 40mg  PO daily and Maalox 30cc PO Q6hr for indigestion. Will continue to monitor. Isac Caddy, RN

## 2017-11-24 NOTE — Progress Notes (Signed)
Inpatient Diabetes Program Recommendations  AACE/ADA: New Consensus Statement on Inpatient Glycemic Control (2015)  Target Ranges:  Prepandial:   less than 140 mg/dL      Peak postprandial:   less than 180 mg/dL (1-2 hours)      Critically ill patients:  140 - 180 mg/dL   Results for CHLOE, BLUETT (MRN [id number]) as of 11/24/2017 09:26  Ref. Range 11/23/2017 11:29 11/23/2017 16:07 11/23/2017 21:58 11/24/2017 06:20  Glucose-Capillary Latest Ref Range: 65 - 99 mg/dL 330 (H) 151 (H) 264 (H) 215 (H)   Review of Glycemic Control  Diabetes history: DM2 Outpatient Diabetes medications: Janumet 5-500 mg BID, Metformin 500 mg BID Current orders for Inpatient glycemic control: Lantus 10 units QHS, Novolog 0-15 units TID with meals, Novolog 4 units TID with meals for meal coverage, Metformin 500 mg BID  Inpatient Diabetes Program Recommendations: Insulin - Basal: Please consider increasing Lantus to 13 units QHS. Correction (SSI): Please consider ordering Novolog 0-5 units QHS for bedtime correction scale.  Thanks, Barnie Alderman, RN, MSN, CDE Diabetes Coordinator Inpatient Diabetes Program [phone] (Team Pager from 8am to 5pm)

## 2017-11-24 NOTE — Progress Notes (Addendum)
PULMONARY / CRITICAL CARE MEDICINE   Name: [name] MRN: [id number] DOB: October 26, 1946    ADMISSION DATE:  11/19/2017   CHIEF COMPLAINT: Altered mental status with hypercalcemia and a loculated right pleural effusion in a patient with metastatic non-small cell lung cancer  HISTORY OF PRESENT ILLNESS:        72 y.o. Female former smoker ( quit 8/ 2018 ) with a PMHx of metastatic NSCLC  who presents to the ED via EMS with altered mental status.per daughter the patient has been sick for the past 4 days. Patient has been more sleepy and confused for three days and has a diminished appetite recently. Earlier on 11/19/17 they found her sweating while in her chair sitting. Also reports chest pain that is reproducible on palpation of anterior and lateral right chest wall described as sharp. Pt also has a mildly productive cough. 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  She is subsequently lethargic and interactive but interactive for me, reports some discomfort in the right chest, but improved dyspnea.  She is no longer requiring BiPAP and is able to speak in complete sentences.    SUBJECTIVE:  No acute events.  Pig tail tube with minimal drainage.   Repeat CT today.   VITAL SIGNS: BP (!) 127/53 (BP Location: Right Arm)   Pulse 81   Temp 98.1 F (36.7 C) (Oral)   Resp (!) 24   Ht 5\' 7"  (1.702 m)   Wt 99.4 kg (219 lb 2.2 oz)   SpO2 97%   BMI 34.32 kg/m      VENTILATOR SETTINGS:    INTAKE / OUTPUT: I/O last 3 completed shifts: In: 600 [P.O.:600] Out: 920 [Urine:900; Chest Tube:20]  PHYSICAL EXAMINATION: General: Adult female, resting in bed, in NAD. Neuro: A&O x 3,  non-focal.  HEENT: [place]/AT. PERRL, sclerae anicteric. Cardiovascular: RRR, no M/R/G.  Lungs: Diminished right base, clear left.  Pigtail catheter in right chest wall, no drainage, no air leak. Abdomen: BS x 4, soft, NT/ND.  Musculoskeletal: No gross deformities, no edema.  Skin: Intact, warm, no rashes.   LABS:  BMET Recent Labs  Lab 11/22/17 0451 11/23/17 0327 11/24/17 0319  NA 131* 131* 130*  K 3.8 3.5 3.7  CL 98* 98* 100*  CO2 23 25 23   BUN 15 18 18   CREATININE 1.55* 1.61* 1.65*  GLUCOSE 276* 235* 233*    Electrolytes Recent Labs  Lab 11/21/17 0232 11/21/17 1211 11/22/17 0451 11/23/17 0327 11/24/17 0319  CALCIUM 12.7* 11.9* 11.0* 10.8* 10.2  MG  --  1.2* 1.6* 2.1  --   PHOS 1.5*  --   --   --   --     CBC Recent Labs  Lab 11/19/17 1950 11/20/17 0456 11/23/17 0327  WBC 18.2* 15.9* 12.6*  HGB 13.8 12.8 12.2  HCT 42.0 39.5 37.4  PLT 445* 432* 331    Coag's No results for input(s): APTT, INR in the last 168 hours.  Sepsis Markers Recent Labs  Lab 11/19/17 2215 11/20/17 1141 11/23/17 0544  LATICACIDVEN 2.28* 3.4* 2.5*    ABG No results for input(s): PHART, PCO2ART, PO2ART in the last 168 hours.  Liver Enzymes Recent Labs  Lab  11/19/17 1950 11/21/17 0232 11/21/17 1211  AST 18  --  38  ALT 13*  --  19  ALKPHOS 151*  --  196*  BILITOT 0.8  --  0.8  ALBUMIN 2.5* 2.4* 2.5*    Cardiac Enzymes No results for input(s): TROPONINI, PROBNP in the last 168 hours.  Glucose Recent Labs  Lab 11/19/17 2017 11/19/17 2055 11/23/17 1129 11/23/17 1607 11/23/17 2158 11/24/17 0620  GLUCAP 159* 168* 330* 151* 264* 215*    Imaging No results found.   CULTURES: Pleural fluid 12/19 > neg  ANTIBIOTICS: Rocephin  SIGNIFICANT EVENTS: Chest drain placed on 12/30  DISCUSSION: This is a 72 year old with metastatic non-small cell lung cancer, including history of brain metastases with associated seizure activity.  She presented with altered  mental status and was found to be hypercalcemic.  In addition she had a right pleural effusion which has been drained and studies returned with exudate, likely due to malignancy (has known malignant effusion).  She has a pigtail catheter in place which has had minimal drainage.  Repeat CT scan on 1/2 was performed and showed persistent effusion with loculations.  Given that we typically don't use intrapleural lytics in this setting, it might be reasonable to monitor this with a repeat CT in 1 - 2 days and see how quickly the effusion is accumulating.  If rapid, then can consider pleur-x catheter placement for long term management.  This along with palliation might be the best approach here. Will await updates from CVTS and oncology.  Rest per primary team.   Montey Hora, [facility] Pager: [phone]  or [phone] 11/24/2017, 9:11 AM  Cytology from sampling of her right pleural effusion on 12/19 was positive.  We are still awaiting a culture result from the more recent drainage placement.  To see how rapidly the effusion is reaccumulating.  With what appears to be significant loculation I am hesitant to attempt to perform a pleurodesis should this prove to be a sterile malignant effusion.  A Pleurx catheter Jiah provide more comfortable palliation.  Lars Masson, MD

## 2017-11-24 NOTE — Progress Notes (Signed)
Consult requested prayer. Family was with patient and she was having breathing treatment. Waited to see her and then family declined chaplain visit. They said she did not want to see chaplain.  I shared if she wanted in the future to simply tell the nurse to request a chaplain.  Conard Novak, Chaplain   11/24/17 1400  Clinical Encounter Type  Visited With Patient and family together  Visit Type Initial  Referral From Nurse  Consult/Referral To Chaplain

## 2017-11-24 NOTE — CV Procedure (Signed)
2D echo attempted but patient just got to chair, Per RN. Will try echo later

## 2017-11-24 NOTE — Progress Notes (Signed)
  Echocardiogram 2D Echocardiogram has been performed.  Bobbye Charleston 11/24/2017, 11:33 AM

## 2017-11-24 NOTE — Progress Notes (Signed)
Ref: Dixie Dials, MD   Subjective:  Sitting up. Tolerating oral feedins. Afebrile. Chest tube drain present.  Objective:  Vital Signs in the last 24 hours: Temp:  [98 F (36.7 C)-98.5 F (36.9 C)] 98.1 F (36.7 C) (01/01 2340) Pulse Rate:  [71-83] 76 (01/02 0745) Cardiac Rhythm: Normal sinus rhythm (01/02 0809) Resp:  [15-25] 25 (01/02 0745) BP: (114-130)/(46-67) 120/57 (01/02 0745) SpO2:  [95 %-100 %] 97 % (01/02 0831)  Physical Exam: BP Readings from Last 1 Encounters:  11/24/17 (!) 120/57     Wt Readings from Last 1 Encounters:  11/23/17 99.4 kg (219 lb 2.2 oz)    Weight change:  Body mass index is 34.32 kg/m. HEENT: [employer]/AT, Eyes-Brown, PERL, EOMI, Conjunctiva-Pink, Sclera-Non-icteric Neck: No JVD, No bruit, Trachea midline. Lungs:  Clearing, Bilateral. Cardiac:  Regular rhythm, normal S1 and S2, no S3. II/VI systolic murmur. Abdomen:  Soft, non-tender. BS present. Extremities:  No edema present. No cyanosis. No clubbing. CNS: AxOx3, Cranial nerves grossly intact, moves all 4 extremities.  Skin: Warm and dry.   Intake/Output from previous day: 01/01 0701 - 01/02 0700 In: 600 [P.O.:600] Out: 600 [Urine:600]    Lab Results: BMET    Component Value Date/Time   NA 130 (L) 11/24/2017 0319   NA 131 (L) 11/23/2017 0327   NA 131 (L) 11/22/2017 0451   NA 139 10/28/2017 1242   NA 131 (L) 09/28/2017 1355   K 3.7 11/24/2017 0319   K 3.5 11/23/2017 0327   K 3.8 11/22/2017 0451   K 3.8 10/28/2017 1242   K 4.5 09/28/2017 1355   CL 100 (L) 11/24/2017 0319   CL 98 (L) 11/23/2017 0327   CL 98 (L) 11/22/2017 0451   CO2 23 11/24/2017 0319   CO2 25 11/23/2017 0327   CO2 23 11/22/2017 0451   CO2 24 10/28/2017 1242   CO2 23 09/28/2017 1355   GLUCOSE 233 (H) 11/24/2017 0319   GLUCOSE 235 (H) 11/23/2017 0327   GLUCOSE 276 (H) 11/22/2017 0451   GLUCOSE 164 (H) 10/28/2017 1242   GLUCOSE 248 (H) 09/28/2017 1355   BUN 18 11/24/2017 0319   BUN 18 11/23/2017 0327   BUN 15  11/22/2017 0451   BUN 15.4 10/28/2017 1242   BUN 25.7 09/28/2017 1355   CREATININE 1.65 (H) 11/24/2017 0319   CREATININE 1.61 (H) 11/23/2017 0327   CREATININE 1.55 (H) 11/22/2017 0451   CREATININE 1.1 10/28/2017 1242   CREATININE 1.2 (H) 09/28/2017 1355   CALCIUM 10.2 11/24/2017 0319   CALCIUM 10.8 (H) 11/23/2017 0327   CALCIUM 11.0 (H) 11/22/2017 0451   CALCIUM 10.4 10/28/2017 1242   CALCIUM 9.1 09/28/2017 1355   GFRNONAA 30 (L) 11/24/2017 0319   GFRNONAA 31 (L) 11/23/2017 0327   GFRNONAA 33 (L) 11/22/2017 0451   GFRAA 35 (L) 11/24/2017 0319   GFRAA 36 (L) 11/23/2017 0327   GFRAA 38 (L) 11/22/2017 0451   CBC    Component Value Date/Time   WBC 12.6 (H) 11/23/2017 0327   RBC 4.28 11/23/2017 0327   HGB 12.2 11/23/2017 0327   HGB 13.0 10/28/2017 1242   HCT 37.4 11/23/2017 0327   HCT 39.6 10/28/2017 1242   PLT 331 11/23/2017 0327   PLT 346 10/28/2017 1242   MCV 87.4 11/23/2017 0327   MCV 89.0 10/28/2017 1242   MCH 28.5 11/23/2017 0327   MCHC 32.6 11/23/2017 0327   RDW 14.4 11/23/2017 0327   RDW 13.8 10/28/2017 1242   LYMPHSABS 1.9 11/23/2017 0327  LYMPHSABS 2.6 10/28/2017 1242   MONOABS 1.4 (H) 11/23/2017 0327   MONOABS 0.8 10/28/2017 1242   EOSABS 0.2 11/23/2017 0327   EOSABS 0.3 10/28/2017 1242   BASOSABS 0.0 11/23/2017 0327   BASOSABS 0.0 10/28/2017 1242   HEPATIC Function Panel Recent Labs    11/01/17 1105 11/19/17 1950 11/21/17 1211  PROT 7.1 6.4* 6.3*   HEMOGLOBIN A1C No components found for: HGA1C,  MPG CARDIAC ENZYMES Lab Results  Component Value Date   CKTOTAL 95 01/01/2009   CKMB 0.8 01/01/2009   TROPONINI <0.01        NO INDICATION OF MYOCARDIAL INJURY. 01/01/2009   TROPONINI <0.01        NO INDICATION OF MYOCARDIAL INJURY. 01/01/2009   TROPONINI <0.01        NO INDICATION OF MYOCARDIAL INJURY. 12/31/2008   BNP No results for input(s): PROBNP in the last 8760 hours. TSH No results for input(s): TSH in the last 8760  hours. CHOLESTEROL No results for input(s): CHOL in the last 8760 hours.  Scheduled Meds: . albuterol  2.5 mg Nebulization TID  . amiodarone  200 mg Oral BID  . diltiazem  120 mg Oral Daily  . diphenhydrAMINE  25 mg Oral QID  . furosemide  40 mg Oral Daily  . heparin  5,000 Units Subcutaneous Q8H  . insulin aspart  0-15 Units Subcutaneous TID WC  . insulin aspart  4 Units Subcutaneous TID WC  . insulin glargine  10 Units Subcutaneous QHS  . levETIRAcetam  500 mg Oral BID  . mouth rinse  15 mL Mouth Rinse BID  . metFORMIN  500 mg Oral BID WC  . metoprolol succinate  25 mg Oral Daily  . pantoprazole  40 mg Oral Daily  . potassium chloride  10 mEq Oral Daily  . sodium chloride flush  5 mL Intravenous Q8H  . umeclidinium bromide  1 puff Inhalation Daily   Continuous Infusions: . cefTRIAXone (ROCEPHIN)  IV 2 g (11/24/17 0924)   PRN Meds:.acetaminophen, albuterol, alum & mag hydroxide-simeth, fentaNYL, hydrocortisone cream, HYDROmorphone, midazolam, sodium chloride flush  Assessment/Plan: Acute sepsis Malignant right pleural effusion Non-small cell lung CA with mets S/P stroke Hypercalcemia of malignancy Obesity Type II DM  Continue current treatment. Resume metformin Increase activity. Awaiting echocardiogram for LV function.   LOS: 4 days    Dixie Dials  MD  11/24/2017, 9:54 AM

## 2017-11-24 NOTE — Progress Notes (Signed)
  Subjective: Resting with family in room Breathing unchanged  Objective: Vital signs in last 24 hours: Temp:  [98.1 F (36.7 C)-98.5 F (36.9 C)] 98.3 F (36.8 C) (01/02 1200) Pulse Rate:  [75-83] 75 (01/02 1200) Cardiac Rhythm: Normal sinus rhythm (01/02 0809) Resp:  [23-25] 23 (01/02 1200) BP: (103-130)/(46-59) 103/55 (01/02 1200) SpO2:  [95 %-100 %] 100 % (01/02 1426)  Hemodynamic parameters for last 24 hours:    Intake/Output from previous day: 01/01 0701 - 01/02 0700 In: 600 [P.O.:600] Out: 600 [Urine:600] Intake/Output this shift: Total I/O In: 220 [P.O.:220] Out: 352 [Urine:351; Stool:1]  General appearance: alert, cooperative and no distress Neurologic: intact Heart: regular rate and rhythm Lungs: diminished breath sounds on right  Lab Results: Recent Labs    11/23/17 0327  WBC 12.6*  HGB 12.2  HCT 37.4  PLT 331   BMET:  Recent Labs    11/23/17 0327 11/24/17 0319  NA 131* 130*  K 3.5 3.7  CL 98* 100*  CO2 25 23  GLUCOSE 235* 233*  BUN 18 18  CREATININE 1.61* 1.65*  CALCIUM 10.8* 10.2    PT/INR: No results for input(s): LABPROT, INR in the last 72 hours. ABG    Component Value Date/Time   PHART 7.441 09/06/2017 1428   HCO3 21.7 11/19/2017 2215   TCO2 23 11/19/2017 2215   ACIDBASEDEF 3.0 (H) 11/19/2017 2215   O2SAT 88.0 11/19/2017 2215   CBG (last 3)  Recent Labs    11/23/17 2158 11/24/17 0620 11/24/17 1150  GLUCAP 264* 215* 215*    Assessment/Plan: S/P  -minimal output form pleural tube -Ct chest shows extensive pleural thickening and minimal loculated fluid -Given the extensive pleural disease she will likely reaccumulate fluid rapidly. I think she would benefit from a trial of talc pleurodesis. Will d/w Pulmonary   LOS: 4 days    Melrose Nakayama 11/24/2017

## 2017-11-24 NOTE — Care Management Important Message (Signed)
Important Message  Patient Details  Name: [name] MRN: [id number] Date of Birth: Jan 27, 1946   Medicare Important Message Given:  Yes    Nathen Oceane 11/24/2017, 9:45 AM

## 2017-11-25 LAB — ECHOCARDIOGRAM COMPLETE
HEIGHTINCHES: 67 in
Weight: 3506.2 oz

## 2017-11-25 LAB — GLUCOSE, CAPILLARY
GLUCOSE-CAPILLARY: 132 mg/dL — AB (ref 65–99)
GLUCOSE-CAPILLARY: 96 mg/dL (ref 65–99)
GLUCOSE-CAPILLARY: 127 mg/dL — AB (ref 65–99)
Glucose-Capillary: 203 mg/dL — ABNORMAL HIGH (ref 65–99)

## 2017-11-25 MED ORDER — INFLUENZA VAC SPLIT HIGH-DOSE 0.5 ML IM SUSY
0.5000 mL | PREFILLED_SYRINGE | INTRAMUSCULAR | Status: DC
  Filled 2017-11-25: qty 0.5

## 2017-11-25 MED ORDER — PNEUMOCOCCAL VAC POLYVALENT 25 MCG/0.5ML IJ INJ
0.5000 mL | INJECTION | INTRAMUSCULAR | Status: AC
  Administered 2017-11-27: 0.5 mL via INTRAMUSCULAR
  Filled 2017-11-25: qty 0.5

## 2017-11-25 MED ORDER — ALPRAZOLAM 0.25 MG PO TABS
0.2500 mg | ORAL_TABLET | Freq: Three times a day (TID) | ORAL | Status: DC | PRN
  Administered 2017-11-25 – 2017-11-29 (×8): 0.25 mg via ORAL
  Filled 2017-11-25 (×8): qty 1

## 2017-11-25 NOTE — Progress Notes (Signed)
PULMONARY / CRITICAL CARE MEDICINE   Name: [name] MRN: [id number] DOB: [birth year]    ADMISSION DATE:  11/19/2017   CHIEF COMPLAINT: Altered mental status with hypercalcemia and a loculated right pleural effusion in a patient with metastatic non-small cell lung cancer  HISTORY OF PRESENT ILLNESS:        72 y.o. Female former smoker ( quit 8/ 2018 ) with a PMHx of metastatic NSCLC  who presents to the ED via EMS with altered mental status.per daughter the patient has been sick for the past 4 days. Patient has been more sleepy and confused for three days and has a diminished appetite recently. Earlier on 11/19/17 they found her sweating while in her chair sitting. Also reports chest pain that is reproducible on palpation of anterior and lateral right chest wall described as sharp. Pt also has a mildly productive cough. 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  She is subsequently lethargic and interactive but interactive for me, reports some discomfort in the right chest, but improved dyspnea.  She is no longer requiring BiPAP and is able to speak in complete sentences.    SUBJECTIVE:  No acute events.  Pig tail tube with minimal drainage.   Repeat CT today.   VITAL SIGNS: BP (!) 120/59 (BP Location: Right Arm)   Pulse 64   Temp 97.6 F (36.4 C) (Oral)   Resp 19   Ht 5\' 7"  (1.702 m)   Wt 96.8 kg (213 lb 6.5 oz)   SpO2 100%   BMI 33.42 kg/m      VENTILATOR SETTINGS:    INTAKE / OUTPUT: I/O last 3 completed shifts: In: 220 [P.O.:220] Out: 1202 [Urine:1201; Stool:1]  PHYSICAL EXAMINATION: General: Morbidly obese female in no acute distress HEENT:.  No JVD  appreciated PSY: Dull effect Neuro: Follows commands, speech slow. CV: Heart sounds are regular regular rate and rhythm systolic ejection murmur noted PULM: Decreased air movement, O2 saturations 100% on 2 L nasal cannula. Right chest tube with no significant drainage for 24 hours, no air leak appreciated. GI: Obese, positive bowel sounds Extremities: 1+ edema Skin: no rashes or lesions    LABS:  BMET Recent Labs  Lab 11/22/17 0451 11/23/17 0327 11/24/17 0319  NA 131* 131* 130*  K 3.8 3.5 3.7  CL 98* 98* 100*  CO2 23 25 23   BUN 15 18 18   CREATININE 1.55* 1.61* 1.65*  GLUCOSE 276* 235* 233*    Electrolytes Recent Labs  Lab 11/21/17 0232 11/21/17 1211 11/22/17 0451 11/23/17 0327 11/24/17 0319  CALCIUM 12.7* 11.9* 11.0* 10.8* 10.2  MG  --  1.2* 1.6* 2.1  --   PHOS 1.5*  --   --   --   --     CBC Recent Labs  Lab 11/19/17 1950 11/20/17 0456 11/23/17 0327  WBC 18.2* 15.9* 12.6*  HGB 13.8 12.8 12.2  HCT 42.0 39.5 37.4  PLT 445* 432* 331    Coag's No results for input(s): APTT, INR in the last 168 hours.  Sepsis Markers Recent Labs  Lab 11/19/17 2215 11/20/17 1141 11/23/17 0544  LATICACIDVEN 2.28* 3.4* 2.5*    ABG No results for input(s): PHART, PCO2ART, PO2ART in  the last 168 hours.  Liver Enzymes Recent Labs  Lab 11/19/17 1950 11/21/17 0232 11/21/17 1211  AST 18  --  38  ALT 13*  --  19  ALKPHOS 151*  --  196*  BILITOT 0.8  --  0.8  ALBUMIN 2.5* 2.4* 2.5*    Cardiac Enzymes No results for input(s): TROPONINI, PROBNP in the last 168 hours.  Glucose Recent Labs  Lab 11/23/17 2158 11/24/17 0620 11/24/17 1150 11/24/17 1605 11/24/17 2042 11/25/17 0647  GLUCAP 264* 215* 215* 179* 194* 203*    Imaging No results found.   CULTURES: Pleural fluid 12/19 > neg  ANTIBIOTICS: Rocephin 12/29>>  SIGNIFICANT EVENTS: Chest drain placed on 12/30>>  DISCUSSION: This is a 72 year old with metastatic non-small cell lung cancer,  including history of brain metastases with associated seizure activity.  She presented with altered mental status and was found to be hypercalcemic.  In addition she had a right pleural effusion which has been drained and studies returned with exudate, likely due to malignancy (has known malignant effusion).  She has a pigtail catheter in place which has had minimal drainage.  Repeat CT scan on 1/2 was performed and showed persistent effusion with loculations. Portable chest x-ray ordered for 11/26/2017.  Appreciate CVT S input.  He has minimal drainage from her chest tube with no drainage of the last 24 hours.  CV TS suggesting a trial of talc pleurodesis.  This appears reasonable at this time.  There is concern for the inflammatory effects of the Talc in a somewhat frail female. Continue her Rocephin although no positive culture data is available.  We will discuss with Dr. Pearline Cables concerning further recommendations.  Note 2D echo is pending at the time of this dictation on 11/25/2017.and cardiology is following.  She is being followed by oncology Dr. Earlie Server to updated that surgical intervention or not a valid option.  Her underlying lung cancer  continue to be treated with chemo at a later date.  Rest per primary team.    Richardson Landry Minor ACNP [facility] Pager [phone] till 1 pm If no answer page [phone] 11/25/2017, 8:31 AM

## 2017-11-25 NOTE — Progress Notes (Signed)
Ref: Dixie Dials, MD   Subjective:  Good LV function with mild LVH on echocardiogram. Minimal pleural drainage. Afebrile. C/O itching. Improving blood sugar levels.  Objective:  Vital Signs in the last 24 hours: Temp:  [97.6 F (36.4 C)-98.9 F (37.2 C)] 98.9 F (37.2 C) (01/03 1214) Pulse Rate:  [64-90] 72 (01/03 1214) Cardiac Rhythm: Normal sinus rhythm;Heart block (01/03 0808) Resp:  [14-28] 28 (01/03 1214) BP: (108-135)/(50-71) 135/57 (01/03 1214) SpO2:  [99 %-100 %] 99 % (01/03 1333) Weight:  [96.8 kg (213 lb 6.5 oz)] 96.8 kg (213 lb 6.5 oz) (01/03 0430)  Physical Exam: BP Readings from Last 1 Encounters:  11/25/17 (!) 135/57     Wt Readings from Last 1 Encounters:  11/25/17 96.8 kg (213 lb 6.5 oz)    Weight change:  Body mass index is 33.42 kg/m. HEENT: [place]/AT, Eyes-Brown, PERL, EOMI, Conjunctiva-Pink, Sclera-Non-icteric Neck: No JVD, No bruit, Trachea midline. Lungs:  Clearing, Bilateral. Right sided chest tube. Cardiac:  Regular rhythm, normal S1 and S2, no S3. II/VI systolic murmur. Abdomen:  Soft, non-tender. BS present. Extremities:  No edema present. No cyanosis. No clubbing. CNS: AxOx3, Cranial nerves grossly intact, moves all 4 extremities.  Skin: Warm and dry.   Intake/Output from previous day: 01/02 0701 - 01/03 0700 In: 220 [P.O.:220] Out: 1202 [Urine:1201; Stool:1]    Lab Results: BMET    Component Value Date/Time   NA 130 (L) 11/24/2017 0319   NA 131 (L) 11/23/2017 0327   NA 131 (L) 11/22/2017 0451   NA 139 10/28/2017 1242   NA 131 (L) 09/28/2017 1355   K 3.7 11/24/2017 0319   K 3.5 11/23/2017 0327   K 3.8 11/22/2017 0451   K 3.8 10/28/2017 1242   K 4.5 09/28/2017 1355   CL 100 (L) 11/24/2017 0319   CL 98 (L) 11/23/2017 0327   CL 98 (L) 11/22/2017 0451   CO2 23 11/24/2017 0319   CO2 25 11/23/2017 0327   CO2 23 11/22/2017 0451   CO2 24 10/28/2017 1242   CO2 23 09/28/2017 1355   GLUCOSE 233 (H) 11/24/2017 0319   GLUCOSE 235 (H)  11/23/2017 0327   GLUCOSE 276 (H) 11/22/2017 0451   GLUCOSE 164 (H) 10/28/2017 1242   GLUCOSE 248 (H) 09/28/2017 1355   BUN 18 11/24/2017 0319   BUN 18 11/23/2017 0327   BUN 15 11/22/2017 0451   BUN 15.4 10/28/2017 1242   BUN 25.7 09/28/2017 1355   CREATININE 1.65 (H) 11/24/2017 0319   CREATININE 1.61 (H) 11/23/2017 0327   CREATININE 1.55 (H) 11/22/2017 0451   CREATININE 1.1 10/28/2017 1242   CREATININE 1.2 (H) 09/28/2017 1355   CALCIUM 10.2 11/24/2017 0319   CALCIUM 10.8 (H) 11/23/2017 0327   CALCIUM 11.0 (H) 11/22/2017 0451   CALCIUM 10.4 10/28/2017 1242   CALCIUM 9.1 09/28/2017 1355   GFRNONAA 30 (L) 11/24/2017 0319   GFRNONAA 31 (L) 11/23/2017 0327   GFRNONAA 33 (L) 11/22/2017 0451   GFRAA 35 (L) 11/24/2017 0319   GFRAA 36 (L) 11/23/2017 0327   GFRAA 38 (L) 11/22/2017 0451   CBC    Component Value Date/Time   WBC 12.6 (H) 11/23/2017 0327   RBC 4.28 11/23/2017 0327   HGB 12.2 11/23/2017 0327   HGB 13.0 10/28/2017 1242   HCT 37.4 11/23/2017 0327   HCT 39.6 10/28/2017 1242   PLT 331 11/23/2017 0327   PLT 346 10/28/2017 1242   MCV 87.4 11/23/2017 0327   MCV 89.0 10/28/2017 1242  MCH 28.5 11/23/2017 0327   MCHC 32.6 11/23/2017 0327   RDW 14.4 11/23/2017 0327   RDW 13.8 10/28/2017 1242   LYMPHSABS 1.9 11/23/2017 0327   LYMPHSABS 2.6 10/28/2017 1242   MONOABS 1.4 (H) 11/23/2017 0327   MONOABS 0.8 10/28/2017 1242   EOSABS 0.2 11/23/2017 0327   EOSABS 0.3 10/28/2017 1242   BASOSABS 0.0 11/23/2017 0327   BASOSABS 0.0 10/28/2017 1242   HEPATIC Function Panel Recent Labs    11/01/17 1105 11/19/17 1950 11/21/17 1211  PROT 7.1 6.4* 6.3*   HEMOGLOBIN A1C No components found for: HGA1C,  MPG CARDIAC ENZYMES Lab Results  Component Value Date   CKTOTAL 95 01/01/2009   CKMB 0.8 01/01/2009   TROPONINI <0.01        NO INDICATION OF MYOCARDIAL INJURY. 01/01/2009   TROPONINI <0.01        NO INDICATION OF MYOCARDIAL INJURY. 01/01/2009   TROPONINI <0.01        NO  INDICATION OF MYOCARDIAL INJURY. 12/31/2008   BNP No results for input(s): PROBNP in the last 8760 hours. TSH No results for input(s): TSH in the last 8760 hours. CHOLESTEROL No results for input(s): CHOL in the last 8760 hours.  Scheduled Meds: . albuterol  2.5 mg Nebulization TID  . amiodarone  200 mg Oral BID  . diltiazem  120 mg Oral Daily  . diphenhydrAMINE  25 mg Oral QID  . furosemide  40 mg Oral Daily  . heparin  5,000 Units Subcutaneous Q8H  . [START ON 11/26/2017] Influenza vac split quadrivalent PF  0.5 mL Intramuscular Tomorrow-1000  . insulin aspart  0-15 Units Subcutaneous TID WC  . insulin aspart  4 Units Subcutaneous TID WC  . insulin glargine  10 Units Subcutaneous QHS  . levETIRAcetam  500 mg Oral BID  . mouth rinse  15 mL Mouth Rinse BID  . metFORMIN  500 mg Oral BID WC  . metoprolol succinate  25 mg Oral Daily  . pantoprazole  40 mg Oral Daily  . [START ON 11/26/2017] pneumococcal 23 valent vaccine  0.5 mL Intramuscular Tomorrow-1000  . potassium chloride  10 mEq Oral Daily  . sodium chloride flush  5 mL Intravenous Q8H  . umeclidinium bromide  1 puff Inhalation Daily   Continuous Infusions: . cefTRIAXone (ROCEPHIN)  IV Stopped (11/25/17 1209)   PRN Meds:.acetaminophen, albuterol, alum & mag hydroxide-simeth, fentaNYL, hydrocortisone cream, HYDROmorphone, midazolam, sodium chloride flush  Assessment/Plan: Malignant right pleural effusion Non-small cell lung CA with mets S/P stroke Hypercalcemia of malignancy- improved COPD Hypertension Obesity Type II DM  Continue medical treatment with possible pleurodesis.   LOS: 5 days    Dixie Dials  MD  11/25/2017, 2:27 PM

## 2017-11-26 ENCOUNTER — Inpatient Hospital Stay (HOSPITAL_COMMUNITY): Payer: Medicare HMO

## 2017-11-26 LAB — GLUCOSE, CAPILLARY
Glucose-Capillary: 130 mg/dL — ABNORMAL HIGH (ref 65–99)
Glucose-Capillary: 130 mg/dL — ABNORMAL HIGH (ref 65–99)
Glucose-Capillary: 120 mg/dL — ABNORMAL HIGH (ref 65–99)
Glucose-Capillary: 140 mg/dL — ABNORMAL HIGH (ref 65–99)

## 2017-11-26 LAB — BASIC METABOLIC PANEL
Anion gap: 9 (ref 5–15)
BUN: 19 mg/dL (ref 6–20)
CALCIUM: 11.7 mg/dL — AB (ref 8.9–10.3)
CHLORIDE: 101 mmol/L (ref 101–111)
CO2: 24 mmol/L (ref 22–32)
CREATININE: 1.55 mg/dL — AB (ref 0.44–1.00)
GFR calc non Af Amer: 33 mL/min — ABNORMAL LOW (ref >60)
GFR, EST AFRICAN AMERICAN: 38 mL/min — AB (ref >60)
GLUCOSE: 147 mg/dL — AB (ref 65–99)
Potassium: 4 mmol/L (ref 3.5–5.1)
Sodium: 134 mmol/L — ABNORMAL LOW (ref 135–145)

## 2017-11-26 MED ORDER — HYDROMORPHONE HCL 2 MG PO TABS
2.0000 mg | ORAL_TABLET | Freq: Every evening | ORAL | Status: DC | PRN
  Administered 2017-11-26: 2 mg via ORAL
  Filled 2017-11-26: qty 1

## 2017-11-26 MED ORDER — LINAGLIPTIN 5 MG PO TABS
5.0000 mg | ORAL_TABLET | Freq: Every day | ORAL | Status: DC
  Administered 2017-11-26 – 2017-11-30 (×5): 5 mg via ORAL
  Filled 2017-11-26 (×5): qty 1

## 2017-11-26 MED ORDER — DIPHENHYDRAMINE HCL 25 MG PO CAPS
25.0000 mg | ORAL_CAPSULE | Freq: Every evening | ORAL | Status: DC | PRN
  Administered 2017-11-28 – 2017-11-29 (×2): 25 mg via ORAL
  Filled 2017-11-26 (×2): qty 1

## 2017-11-26 MED ORDER — AMIODARONE HCL 200 MG PO TABS
200.0000 mg | ORAL_TABLET | Freq: Every day | ORAL | Status: DC
  Administered 2017-11-26 – 2017-11-30 (×5): 200 mg via ORAL
  Filled 2017-11-26 (×5): qty 1

## 2017-11-26 NOTE — Progress Notes (Signed)
Ref: Dixie Dials, MD  Subjective:  Somewhat sleepy. T max 99.1 degree F.  Objective:  Vital Signs in the last 24 hours: Temp:  [98 F (36.7 C)-99.1 F (37.3 C)] 98.7 F (37.1 C) (01/04 0420) Pulse Rate:  [66-72] 66 (01/04 0420) Cardiac Rhythm: Normal sinus rhythm (01/04 0801) Resp:  [18-28] 18 (01/04 0420) BP: (94-135)/(53-74) 131/53 (01/04 0853) SpO2:  [95 %-100 %] 95 % (01/04 0420) Weight:  [94.6 kg (208 lb 8.9 oz)] 94.6 kg (208 lb 8.9 oz) (01/04 0445)  Physical Exam: BP Readings from Last 1 Encounters:  11/26/17 (!) 131/53     Wt Readings from Last 1 Encounters:  11/26/17 94.6 kg (208 lb 8.9 oz)    Weight change: -2.2 kg (-13.6 oz) Body mass index is 32.66 kg/m. HEENT: [place]/AT, Eyes-Brown, PERL, EOMI, Conjunctiva-Pink, Sclera-Non-icteric Neck: No JVD, No bruit, Trachea midline. Lungs:  Clearing, Bilateral. Cardiac:  Regular rhythm, normal S1 and S2, no S3. II/VI systolic murmur. Abdomen:  Soft, non-tender. BS present. Extremities:  Trace edema present. No cyanosis. No clubbing. CNS: AxOx3, Cranial nerves grossly intact, moves all 4 extremities.  Skin: Warm and dry.   Intake/Output from previous day: 01/03 0701 - 01/04 0700 In: 550 [P.O.:430; IV Piggyback:120] Out: 950 [Urine:950]    Lab Results: BMET    Component Value Date/Time   NA 134 (L) 11/26/2017 0313   NA 130 (L) 11/24/2017 0319   NA 131 (L) 11/23/2017 0327   NA 139 10/28/2017 1242   NA 131 (L) 09/28/2017 1355   K 4.0 11/26/2017 0313   K 3.7 11/24/2017 0319   K 3.5 11/23/2017 0327   K 3.8 10/28/2017 1242   K 4.5 09/28/2017 1355   CL 101 11/26/2017 0313   CL 100 (L) 11/24/2017 0319   CL 98 (L) 11/23/2017 0327   CO2 24 11/26/2017 0313   CO2 23 11/24/2017 0319   CO2 25 11/23/2017 0327   CO2 24 10/28/2017 1242   CO2 23 09/28/2017 1355   GLUCOSE 147 (H) 11/26/2017 0313   GLUCOSE 233 (H) 11/24/2017 0319   GLUCOSE 235 (H) 11/23/2017 0327   GLUCOSE 164 (H) 10/28/2017 1242   GLUCOSE 248 (H)  09/28/2017 1355   BUN 19 11/26/2017 0313   BUN 18 11/24/2017 0319   BUN 18 11/23/2017 0327   BUN 15.4 10/28/2017 1242   BUN 25.7 09/28/2017 1355   CREATININE 1.55 (H) 11/26/2017 0313   CREATININE 1.65 (H) 11/24/2017 0319   CREATININE 1.61 (H) 11/23/2017 0327   CREATININE 1.1 10/28/2017 1242   CREATININE 1.2 (H) 09/28/2017 1355   CALCIUM 11.7 (H) 11/26/2017 0313   CALCIUM 10.2 11/24/2017 0319   CALCIUM 10.8 (H) 11/23/2017 0327   CALCIUM 10.4 10/28/2017 1242   CALCIUM 9.1 09/28/2017 1355   GFRNONAA 33 (L) 11/26/2017 0313   GFRNONAA 30 (L) 11/24/2017 0319   GFRNONAA 31 (L) 11/23/2017 0327   GFRAA 38 (L) 11/26/2017 0313   GFRAA 35 (L) 11/24/2017 0319   GFRAA 36 (L) 11/23/2017 0327   CBC    Component Value Date/Time   WBC 12.6 (H) 11/23/2017 0327   RBC 4.28 11/23/2017 0327   HGB 12.2 11/23/2017 0327   HGB 13.0 10/28/2017 1242   HCT 37.4 11/23/2017 0327   HCT 39.6 10/28/2017 1242   PLT 331 11/23/2017 0327   PLT 346 10/28/2017 1242   MCV 87.4 11/23/2017 0327   MCV 89.0 10/28/2017 1242   MCH 28.5 11/23/2017 0327   MCHC 32.6 11/23/2017 0327   RDW  14.4 11/23/2017 0327   RDW 13.8 10/28/2017 1242   LYMPHSABS 1.9 11/23/2017 0327   LYMPHSABS 2.6 10/28/2017 1242   MONOABS 1.4 (H) 11/23/2017 0327   MONOABS 0.8 10/28/2017 1242   EOSABS 0.2 11/23/2017 0327   EOSABS 0.3 10/28/2017 1242   BASOSABS 0.0 11/23/2017 0327   BASOSABS 0.0 10/28/2017 1242   HEPATIC Function Panel Recent Labs    11/01/17 1105 11/19/17 1950 11/21/17 1211  PROT 7.1 6.4* 6.3*   HEMOGLOBIN A1C No components found for: HGA1C,  MPG CARDIAC ENZYMES Lab Results  Component Value Date   CKTOTAL 95 01/01/2009   CKMB 0.8 01/01/2009   TROPONINI <0.01        NO INDICATION OF MYOCARDIAL INJURY. 01/01/2009   TROPONINI <0.01        NO INDICATION OF MYOCARDIAL INJURY. 01/01/2009   TROPONINI <0.01        NO INDICATION OF MYOCARDIAL INJURY. 12/31/2008   BNP No results for input(s): PROBNP in the last 8760  hours. TSH No results for input(s): TSH in the last 8760 hours. CHOLESTEROL No results for input(s): CHOL in the last 8760 hours.  Scheduled Meds: . albuterol  2.5 mg Nebulization TID  . amiodarone  200 mg Oral Daily  . diltiazem  120 mg Oral Daily  . furosemide  40 mg Oral Daily  . heparin  5,000 Units Subcutaneous Q8H  . Influenza vac split quadrivalent PF  0.5 mL Intramuscular Tomorrow-1000  . insulin aspart  0-15 Units Subcutaneous TID WC  . levETIRAcetam  500 mg Oral BID  . linagliptin  5 mg Oral Daily  . mouth rinse  15 mL Mouth Rinse BID  . metFORMIN  500 mg Oral BID WC  . metoprolol succinate  25 mg Oral Daily  . pantoprazole  40 mg Oral Daily  . pneumococcal 23 valent vaccine  0.5 mL Intramuscular Tomorrow-1000  . potassium chloride  10 mEq Oral Daily  . sodium chloride flush  5 mL Intravenous Q8H  . umeclidinium bromide  1 puff Inhalation Daily   Continuous Infusions: . cefTRIAXone (ROCEPHIN)  IV Stopped (11/25/17 1209)   PRN Meds:.acetaminophen, albuterol, ALPRAZolam, alum & mag hydroxide-simeth, diphenhydrAMINE, hydrocortisone cream, HYDROmorphone, midazolam, sodium chloride flush  Assessment/Plan: Malignant right pleural effusion Non-small cell lung CA with mets S/P stroke Hypercalcemia of malignancy COPD Hypertension Obesity Type II DM  Add Tradjenta to discontinue insulin. Decrease sedation and dilaudid frequency. DC chest tube. Increase activity.   LOS: 6 days    Dixie Dials  MD  11/26/2017, 10:14 AM

## 2017-11-26 NOTE — Progress Notes (Signed)
PULMONARY / CRITICAL CARE MEDICINE   Name: [name] MRN: [id number] DOB: Jul 16, 1946    ADMISSION DATE:  11/19/2017   CHIEF COMPLAINT: Altered mental status with hypercalcemia and a loculated right pleural effusion in a patient with metastatic non-small cell lung cancer  HISTORY OF PRESENT ILLNESS:        72 y.o. Female former smoker ( quit 8/ 2018 ) with a PMHx of metastatic NSCLC  who presents to the ED via EMS with altered mental status.per daughter the patient has been sick for the past 4 days. Patient has been more sleepy and confused for three days and has a diminished appetite recently. Earlier on 11/19/17 they found her sweating while in her chair sitting. Also reports chest pain that is reproducible on palpation of anterior and lateral right chest wall described as sharp. Pt also has a mildly productive cough. 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  [name] is subsequently lethargic and interactive but interactive for me, reports some discomfort in the right chest, but improved dyspnea.  [name] is no longer requiring BiPAP and is able to speak in complete sentences.    SUBJECTIVE: Received Dilaudid and Xanax for right-sided chest pain from chest tube..  [name] has a dull effect.  No drainage from chest tube in 48 hours.   VITAL SIGNS: BP (!) 131/53   Pulse 66 Comment: Simultaneous filing. User Briawna not have seen previous data.  Temp 98.7 F (37.1 C) (Oral)   Resp 18 Comment: Simultaneous filing. User Philomene not have seen previous data.  Ht 5\' 7"  (1.702 m)   Wt 94.6 kg (208 lb 8.9 oz)   SpO2 95% Comment: Simultaneous filing. User Tyronica not have seen previous data.  BMI 32.66  kg/m      VENTILATOR SETTINGS:    INTAKE / OUTPUT: I/O last 3 completed shifts: In: 550 [P.O.:430; IV Piggyback:120] Out: 1300 [Urine:1300]  PHYSICAL EXAMINATION: General: Obese female currently sedated with Dilaudid and Xanax is poorly arousable. HEENT: No JVD or lymphadenopathy is appreciated PSY: Dull effect from sedation Neuro: Sedated with Dilaudid and Xanax, reports right-sided chest pain at site of chest tube.  Moves all extremities x4.  Speech somewhat slurred CV: Heart sounds are regular regular rate and rhythm PULM: Increased rhonchi on the right side, right chest tube without drainage times 48 hours, patient reports pain at chest tube insertion site. GI: Obese, soft nontender Extremities: warm/dry, 1+ edema  Skin: no rashes or lesions     LABS:  BMET Recent Labs  Lab 11/23/17 0327 11/24/17 0319 11/26/17 0313  NA 131* 130* 134*  K 3.5 3.7 4.0  CL 98* 100* 101  CO2 25 23 24   BUN 18 18 19   CREATININE 1.61* 1.65* 1.55*  GLUCOSE 235* 233* 147*    Electrolytes Recent Labs  Lab 11/21/17 0232 11/21/17 1211 11/22/17 0451 11/23/17 0327 11/24/17 0319 11/26/17 0313  CALCIUM 12.7* 11.9* 11.0* 10.8* 10.2 11.7*  MG  --  1.2* 1.6* 2.1  --   --   PHOS 1.5*  --   --   --   --   --     CBC Recent Labs  Lab 11/19/17 1950 11/20/17 0456 11/23/17 0327  WBC 18.2* 15.9* 12.6*  HGB 13.8 12.8 12.2  HCT 42.0 39.5 37.4  PLT 445* 432* 331    Coag's No results for input(s): APTT, INR in the last 168 hours.  Sepsis Markers Recent Labs  Lab 11/19/17 2215 11/20/17 1141 11/23/17 0544  LATICACIDVEN 2.28* 3.4* 2.5*    ABG No results for input(s): PHART, PCO2ART, PO2ART in the last 168 hours.  Liver Enzymes Recent Labs  Lab 11/19/17 1950 11/21/17 0232 11/21/17 1211  AST 18  --  38  ALT 13*  --  19  ALKPHOS 151*  --  196*  BILITOT 0.8  --  0.8  ALBUMIN 2.5* 2.4* 2.5*    Cardiac Enzymes No results for input(s): TROPONINI, PROBNP in the last 168  hours.  Glucose Recent Labs  Lab 11/24/17 2042 11/25/17 0647 11/25/17 1111 11/25/17 1649 11/25/17 2102 11/26/17 0547  GLUCAP 194* 203* 132* 96 127* 130*    Imaging Ct Chest Wo Contrast  Result Date: 11/26/2017 CLINICAL DATA:  Chest pain, shortness of breath. EXAM: CT CHEST WITHOUT CONTRAST TECHNIQUE: Multidetector CT imaging of the chest was performed following the standard protocol without IV contrast. COMPARISON:  CT scan of November 24, 2017. FINDINGS: Cardiovascular: Atherosclerosis of thoracic aorta is noted without aneurysm formation. Coronary artery calcifications are noted. Normal cardiac size. Mediastinum/Nodes: 2.3 cm subcarinal adenopathy is noted consistent with malignancy. 1.6 cm right peritracheal adenopathy is noted. 1.5 cm precarinal adenopathy is noted. Thyroid gland is unremarkable. Esophagus is unremarkable. Lungs/Pleura: Stable position of pigtail drainage catheter seen posteriorly in right lung base. No definite pneumothorax is noted. Stable malignant pleural thickening is noted in right hemithorax. Stable 4.7 cm cavitated right upper lobe mass is noted consistent with malignancy. Left lung is unremarkable. Stable right posterior basilar opacity is noted concerning for atelectasis or pneumonia. Probable small loculated right pleural effusion is noted. Upper Abdomen: No acute abnormality. Musculoskeletal: No chest wall mass or suspicious bone lesions identified. IMPRESSION: Coronary artery calcifications are noted suggesting coronary artery disease. Stable mediastinal adenopathy is noted consistent with metastatic disease. Stable circumferential pleural thickening is noted in right hemithorax consistent with metastatic disease. Stable 4.7 cm cavitating right upper lobe mass consistent with malignancy. Stable right posterior basilar opacity is noted concerning for atelectasis or pneumonia. Probable small loculated right pleural effusion. Stable position of pigtail drainage catheter  seen posteriorly in right lung base. No definite pneumothorax is noted. Aortic Atherosclerosis (ICD10-I70.0). Electronically Signed   By: Marijo Conception, M.D.   On: 11/26/2017 07:24   Dg Chest Port 1 View  Result Date: 11/26/2017 CLINICAL DATA:  Right chest tube. EXAM: PORTABLE CHEST 1 VIEW COMPARISON:  Chest CT from earlier same day. Chest x-ray dated 11/22/2017. FINDINGS: Pigtail chest tube is stable in position at the right lung base. Again noted is the irregular masslike pleural thickening throughout the right chest, described as malignant pleural thickening on today's earlier chest CT. No pneumothorax seen. Left lung remains clear. Heart size and mediastinal contours are stable. Atherosclerotic changes again noted at the aortic arch IMPRESSION: 1. Right-sided chest tube is stable in position at the right lung base. 2. Overall stable appearance. 3. Aortic atherosclerosis. Electronically Signed   By: Franki Cabot M.D.   On: 11/26/2017 09:11     CULTURES: Pleural fluid 12/19 > neg  ANTIBIOTICS: Rocephin 12/29>>  SIGNIFICANT EVENTS: Chest drain placed on 12/30>>  DISCUSSION: This is a 72 year old with metastatic non-small cell lung cancer, including history of brain metastases with associated seizure activity.  [name] presented with altered mental status and was found to be hypercalcemic.  In addition [name] had a right pleural effusion which has been drained and studies returned with exudate, likely due to malignancy (has known malignant effusion).   11/26/2017 follow-up chest CT reveals loculated right effusion with no increase in size with good chest tube placement.  Portable chest x-ray shows no change in right effusion. Therefore would recommend discontinuing right chest tube(placed by interventional radiology on 11/21/2017) and arrange for her to be followed up with oncology for continued treatment of her metastatic lung cancer.  Should her effusion recur at that time you get it entertain the  thought of replacing chest tube and on the talcum pleurodesis but since [name] has not had any chest tube drainage in over 48 hours be best to discontinue the chest tube at this time and follow-up in later date.  Once her chest tube is removed and her chest pain should improve.  Be very careful with sedation as this [name] lead to respiratory compromise.  CV TS input is greatly appreciated..    Note 2D echo demonstrates EF 65-70% with a grade 1 diastolic dysfunction.and cardiology is following.  [name] is being followed by oncology Dr. Earlie Server to updated that surgical intervention is not a valid option.  Her underlying lung cancer can continue to be treated with chemo at a later date.  PCCM will be available as needed and will follow up on 11/29/2017.  Rest per primary team.    Richardson Landry Londin Antone ACNP Maryanna Shape PCCM Pager [phone] till 1 pm If no answer page [phone] 11/26/2017, 9:26 AM

## 2017-11-26 NOTE — Progress Notes (Addendum)
Attempted to remove pigtail chest tube from right flank.  Unable to remove. Called NP Richardson Landry Minor and chest removed and clot at end of chest tube noted.  Occlusive dressing applied.  Ordered STAT CXR. Pt resting with call bell within reach.  Will continue to monitor. Payton Emerald, RN

## 2017-11-26 NOTE — Progress Notes (Signed)
Patient refuses BIPAP

## 2017-11-26 NOTE — Progress Notes (Signed)
  Subjective: Tired. Just got back in bed after being up a long time  Objective: Vital signs in last 24 hours: Temp:  [98 F (36.7 C)-99.1 F (37.3 C)] 98.1 F (36.7 C) (01/04 1206) Pulse Rate:  [66-72] 67 (01/04 1206) Cardiac Rhythm: Normal sinus rhythm;Heart block (01/04 0900) Resp:  [17-28] 17 (01/04 1206) BP: (94-131)/(53-74) 125/59 (01/04 1206) SpO2:  [95 %-100 %] 100 % (01/04 1415) Weight:  [208 lb 8.9 oz (94.6 kg)] 208 lb 8.9 oz (94.6 kg) (01/04 0445)  Hemodynamic parameters for last 24 hours:    Intake/Output from previous day: 01/03 0701 - 01/04 0700 In: 550 [P.O.:430; IV Piggyback:120] Out: 950 [Urine:950] Intake/Output this shift: Total I/O In: 20 [I.V.:20] Out: 350 [Urine:350]  General appearance: alert and cooperative Heart: regular rate and rhythm Lungs: diminished breath sounds on right  Lab Results: No results for input(s): WBC, HGB, HCT, PLT in the last 72 hours. BMET:  Recent Labs    11/24/17 0319 11/26/17 0313  NA 130* 134*  K 3.7 4.0  CL 100* 101  CO2 23 24  GLUCOSE 233* 147*  BUN 18 19  CREATININE 1.65* 1.55*  CALCIUM 10.2 11.7*    PT/INR: No results for input(s): LABPROT, INR in the last 72 hours. ABG    Component Value Date/Time   PHART 7.441 09/06/2017 1428   HCO3 21.7 11/19/2017 2215   TCO2 23 11/19/2017 2215   ACIDBASEDEF 3.0 (H) 11/19/2017 2215   O2SAT 88.0 11/19/2017 2215   CBG (last 3)  Recent Labs    11/25/17 2102 11/26/17 0547 11/26/17 1115  GLUCAP 127* 130* 130*    Assessment/Plan: S/P  -Pleural tube removed earlier today She is at high risk for recurrence of her effusion.  Plan per medical team   LOS: 6 days    Melrose Nakayama 11/26/2017

## 2017-11-27 LAB — GLUCOSE, CAPILLARY
GLUCOSE-CAPILLARY: 136 mg/dL — AB (ref 65–99)
Glucose-Capillary: 140 mg/dL — ABNORMAL HIGH (ref 65–99)
Glucose-Capillary: 114 mg/dL — ABNORMAL HIGH (ref 65–99)
Glucose-Capillary: 132 mg/dL — ABNORMAL HIGH (ref 65–99)

## 2017-11-27 MED ORDER — HYDROMORPHONE HCL 2 MG PO TABS
1.0000 mg | ORAL_TABLET | Freq: Four times a day (QID) | ORAL | Status: DC | PRN
  Administered 2017-11-27 – 2017-11-30 (×5): 1 mg via ORAL
  Filled 2017-11-27 (×5): qty 1

## 2017-11-27 NOTE — Evaluation (Signed)
Physical Therapy Evaluation Patient Details Name: [name] MRN: [id number] DOB: 1946/11/01 Today's Date: 11/27/2017   History of Present Illness  Pt is a 72 y.o. 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 CXR and chest CT positive for R-side pleural effusion with possible pneumonia/empyema. Peritnent PMH includes a-fib with RVR, DM, COPD, HTN, vertigo. Of note, admitted 1 month ago with finding of metastatic lesin in left cerebral hemisphere.    Clinical Impression  Pt presents with generalized weakness, decreased activity tolerance, and an overall decrease in functional mobility secondary to above. PTA, pt indep with mobility and lives at home with husband. Today, required min-modA+2 to stand and take steps from bed to chair with RW; pt with decreased awareness and significant fatigue limiting further mobility. Pt would benefit from continued acute PT services to maximize functional mobility and independence prior to d/c with SNF-level therapies.     Follow Up Recommendations SNF;Supervision/Assistance - 24 hour    Equipment Recommendations  (TBD next venue)    Recommendations for Other Services       Precautions / Restrictions Precautions Precautions: Fall Restrictions Weight Bearing Restrictions: No      Mobility  Bed Mobility Overal bed mobility: Needs Assistance Bed Mobility: Supine to Sit     Supine to sit: Max assist;HOB elevated     General bed mobility comments: MaxA to assist hips to EOB with use of pad and single UE support to assist trunk elevation; once sitting, min guard for balance  Transfers Overall transfer level: Needs assistance Equipment used: Rolling walker (2 wheeled) Transfers: Sit to/from Stand Sit to Stand: Mod assist            Ambulation/Gait Ambulation/Gait  assistance: Mod assist;+2 physical assistance Ambulation Distance (Feet): 2 Feet Assistive device: Rolling walker (2 wheeled) Gait Pattern/deviations: Step-to pattern;Decreased weight shift to left;Staggering right Gait velocity: Decreased   General Gait Details: Pt very unsteady taking steps from bed to chair with RW, requiring modA+2 to maintain trunk elevation, assist/guide RW, and cue bilat hip extension, as pt repeatedly leaning back with decreased awareness  Stairs            Wheelchair Mobility    Modified Rankin (Stroke Patients Only)       Balance Overall balance assessment: Needs assistance   Sitting balance-Leahy Scale: Fair       Standing balance-Leahy Scale: Poor                               Pertinent Vitals/Pain Pain Assessment: Faces Faces Pain Scale: Hurts little more Pain Location: Generalized Pain Descriptors / Indicators: Discomfort Pain Intervention(s): Monitored during session    Home Living Family/patient expects to be discharged to:: Private residence Living Arrangements: Spouse/significant other Available Help at Discharge: Available 24 hours/day;Family Type of Home: House Home Access: Stairs to enter Entrance Stairs-Rails: None Entrance Stairs-Number of Steps: 2 Home Layout: One level Home Equipment: Environmental consultant - 2 wheels      Prior Function Level of Independence: Independent               Hand Dominance        Extremity/Trunk Assessment   Upper Extremity Assessment Upper Extremity Assessment: Generalized weakness    Lower Extremity Assessment Lower Extremity Assessment: Generalized weakness  Cervical / Trunk Assessment Cervical / Trunk Assessment: Kyphotic  Communication      Cognition Arousal/Alertness: Lethargic;Suspect due to medications Behavior During Therapy: Flat affect Overall Cognitive Status: Impaired/Different from baseline Area of Impairment: Attention;Following  commands;Safety/judgement;Awareness;Problem solving                   Current Attention Level: Sustained   Following Commands: Follows one step commands inconsistently Safety/Judgement: Decreased awareness of safety;Decreased awareness of deficits Awareness: Intellectual Problem Solving: Slow processing;Difficulty sequencing;Requires verbal cues;Requires tactile cues;Decreased initiation        General Comments General comments (skin integrity, edema, etc.): Husband present    Exercises     Assessment/Plan    PT Assessment Patient needs continued PT services  PT Problem List Decreased strength;Decreased activity tolerance;Decreased balance;Decreased mobility;Decreased cognition;Decreased knowledge of use of DME;Decreased safety awareness;Decreased skin integrity;Obesity       PT Treatment Interventions DME instruction;Gait training;Stair training;Functional mobility training;Therapeutic activities;Therapeutic exercise;Balance training;Patient/family education;Cognitive remediation    PT Goals (Current goals can be found in the Care Plan section)  Acute Rehab PT Goals Patient Stated Goal: Get stronger PT Goal Formulation: With patient/family Time For Goal Achievement: 12/11/17 Potential to Achieve Goals: Good    Frequency Min 2X/week   Barriers to discharge        Co-evaluation               AM-PAC PT "6 Clicks" Daily Activity  Outcome Measure Difficulty turning over in bed (including adjusting bedclothes, sheets and blankets)?: Unable Difficulty moving from lying on back to sitting on the side of the bed? : Unable Difficulty sitting down on and standing up from a chair with arms (e.g., wheelchair, bedside commode, etc,.)?: A Little Help needed moving to and from a bed to chair (including a wheelchair)?: A Lot Help needed walking in hospital room?: A Lot Help needed climbing 3-5 steps with a railing? : Total 6 Click Score: 10    End of Session  Equipment Utilized During Treatment: Gait belt Activity Tolerance: Patient limited by fatigue Patient left: in chair;with call bell/phone within reach;with nursing/sitter in room Nurse Communication: Mobility status;Need for lift equipment PT Visit Diagnosis: Other abnormalities of gait and mobility (R26.89);Muscle weakness (generalized) (M62.81)    Time: 7654-6503 PT Time Calculation (min) (ACUTE ONLY): 16 min   Charges:   PT Evaluation $PT Eval Moderate Complexity: 1 Mod     PT G Codes:       Mabeline Caras, PT, DPT Acute Rehab Services  Pager: [place] 11/27/2017, 11:54 AM

## 2017-11-27 NOTE — Progress Notes (Signed)
Pt refusing BIPAP, pulling neb tx off, didn't want to finish scheduled tx. RT will continue to monitor.

## 2017-11-27 NOTE — Progress Notes (Signed)
Subjective:  Patient complains of pleuritic right-sided chest pain. Denies any shortness of breath. Chest tube was removed yesterday.  Objective:  Vital Signs in the last 24 hours: Temp:  [98.1 F (36.7 C)-98.8 F (37.1 C)] 98.1 F (36.7 C) (01/05 0517) Pulse Rate:  [63-76] 63 (01/05 0910) Resp:  [17-29] 20 (01/05 0910) BP: (125-128)/(52-59) 125/57 (01/05 0517) SpO2:  [92 %-100 %] 94 % (01/05 0910) Weight:  [95.6 kg (210 lb 12.2 oz)] 95.6 kg (210 lb 12.2 oz) (01/05 0517)  Intake/Output from previous day: 01/04 0701 - 01/05 0700 In: 120 [P.O.:100; I.V.:20] Out: 725 [Urine:725] Intake/Output from this shift: No intake/output data recorded.  Physical Exam: Neck: no adenopathy, no carotid bruit, no JVD and supple, symmetrical, trachea midline Lungs: Decreased breath sound right lung field Heart: regular rate and rhythm, S1, S2 normal and 2/6 systolic murmur noted Abdomen: soft, non-tender; bowel sounds normal; no masses,  no organomegaly Extremities: extremities normal, atraumatic, no cyanosis or edema Pulses: 2+ and symmetric  Lab Results: No results for input(s): WBC, HGB, PLT in the last 72 hours. Recent Labs    11/26/17 0313  NA 134*  K 4.0  CL 101  CO2 24  GLUCOSE 147*  BUN 19  CREATININE 1.55*   No results for input(s): TROPONINI in the last 72 hours.  Invalid input(s): CK, MB Hepatic Function Panel No results for input(s): PROT, ALBUMIN, AST, ALT, ALKPHOS, BILITOT, BILIDIR, IBILI in the last 72 hours. No results for input(s): CHOL in the last 72 hours. No results for input(s): PROTIME in the last 72 hours.  Imaging: Imaging results have been reviewed and Ct Chest Wo Contrast  Result Date: 11/26/2017 CLINICAL DATA:  Chest pain, shortness of breath. EXAM: CT CHEST WITHOUT CONTRAST TECHNIQUE: Multidetector CT imaging of the chest was performed following the standard protocol without IV contrast. COMPARISON:  CT scan of November 24, 2017. FINDINGS: Cardiovascular:  Atherosclerosis of thoracic aorta is noted without aneurysm formation. Coronary artery calcifications are noted. Normal cardiac size. Mediastinum/Nodes: 2.3 cm subcarinal adenopathy is noted consistent with malignancy. 1.6 cm right peritracheal adenopathy is noted. 1.5 cm precarinal adenopathy is noted. Thyroid gland is unremarkable. Esophagus is unremarkable. Lungs/Pleura: Stable position of pigtail drainage catheter seen posteriorly in right lung base. No definite pneumothorax is noted. Stable malignant pleural thickening is noted in right hemithorax. Stable 4.7 cm cavitated right upper lobe mass is noted consistent with malignancy. Left lung is unremarkable. Stable right posterior basilar opacity is noted concerning for atelectasis or pneumonia. Probable small loculated right pleural effusion is noted. Upper Abdomen: No acute abnormality. Musculoskeletal: No chest wall mass or suspicious bone lesions identified. IMPRESSION: Coronary artery calcifications are noted suggesting coronary artery disease. Stable mediastinal adenopathy is noted consistent with metastatic disease. Stable circumferential pleural thickening is noted in right hemithorax consistent with metastatic disease. Stable 4.7 cm cavitating right upper lobe mass consistent with malignancy. Stable right posterior basilar opacity is noted concerning for atelectasis or pneumonia. Probable small loculated right pleural effusion. Stable position of pigtail drainage catheter seen posteriorly in right lung base. No definite pneumothorax is noted. Aortic Atherosclerosis (ICD10-I70.0). Electronically Signed   By: Marijo Conception, M.D.   On: 11/26/2017 07:24   Dg Chest Port 1 View  Result Date: 11/26/2017 CLINICAL DATA:  Right-sided chest tube removal. EXAM: PORTABLE CHEST 1 VIEW COMPARISON:  Chest x-ray from same day CT 6:21 a.m. FINDINGS: Interval removal of right-sided chest tube. No pneumothorax. Unchanged irregular, masslike pleural thickening  throughout the right  hemithorax. The left lung is clear. Stable cardiomediastinal silhouette. IMPRESSION: 1. No removal of right-sided chest tube.  No pneumothorax. 2. Unchanged irregular, masslike pleural thickening throughout the right hemithorax. Electronically Signed   By: Titus Dubin M.D.   On: 11/26/2017 11:48   Dg Chest Port 1 View  Result Date: 11/26/2017 CLINICAL DATA:  Right chest tube. EXAM: PORTABLE CHEST 1 VIEW COMPARISON:  Chest CT from earlier same day. Chest x-ray dated 11/22/2017. FINDINGS: Pigtail chest tube is stable in position at the right lung base. Again noted is the irregular masslike pleural thickening throughout the right chest, described as malignant pleural thickening on today's earlier chest CT. No pneumothorax seen. Left lung remains clear. Heart size and mediastinal contours are stable. Atherosclerotic changes again noted at the aortic arch IMPRESSION: 1. Right-sided chest tube is stable in position at the right lung base. 2. Overall stable appearance. 3. Aortic atherosclerosis. Electronically Signed   By: Franki Cabot M.D.   On: 11/26/2017 09:11    Cardiac Studies:  Assessment/Plan:  Status post Malignant right pleural effusion Non-small cell lung CA with mets S/P stroke Hypercalcemia of malignancy COPD Hypertension Obesity Type II DM Plan Encouraged ambulation PT consult Tramadol 50 mg every 8 hours when necessary     LOS: 7 days    Charolette Forward 11/27/2017, 10:49 AM

## 2017-11-28 LAB — GLUCOSE, CAPILLARY
GLUCOSE-CAPILLARY: 129 mg/dL — AB (ref 65–99)
GLUCOSE-CAPILLARY: 120 mg/dL — AB (ref 65–99)
Glucose-Capillary: 114 mg/dL — ABNORMAL HIGH (ref 65–99)
Glucose-Capillary: 118 mg/dL — ABNORMAL HIGH (ref 65–99)

## 2017-11-28 NOTE — Progress Notes (Signed)
Subjective:  Patient denies any chest pain or shortness of breath. Feels tired and weak.  Objective:  Vital Signs in the last 24 hours: Temp:  [97.2 F (36.2 C)-98.5 F (36.9 C)] 98.5 F (36.9 C) (01/06 0530) Pulse Rate:  [67-72] 72 (01/06 1023) Resp:  [14-35] 18 (01/06 0753) BP: (107-137)/(53-86) 135/76 (01/06 1023) SpO2:  [91 %-96 %] 96 % (01/06 0753) Weight:  [92.5 kg (203 lb 14.8 oz)] 92.5 kg (203 lb 14.8 oz) (01/06 0227)  Intake/Output from previous day: 01/05 0701 - 01/06 0700 In: -  Out: 2726 [Urine:2726] Intake/Output from this shift: No intake/output data recorded.  Physical Exam: Neck: no adenopathy, no carotid bruit, no JVD and supple, symmetrical, trachea midline Lungs: Decreased breath sound right lung field with occasional rhonchi Heart: regular rate and rhythm, S1, S2 normal and Soft systolic murmur noted Abdomen: soft, non-tender; bowel sounds normal; no masses,  no organomegaly Extremities: extremities normal, atraumatic, no cyanosis or edema  Lab Results: No results for input(s): WBC, HGB, PLT in the last 72 hours. Recent Labs    11/26/17 0313  NA 134*  K 4.0  CL 101  CO2 24  GLUCOSE 147*  BUN 19  CREATININE 1.55*   No results for input(s): TROPONINI in the last 72 hours.  Invalid input(s): CK, MB Hepatic Function Panel No results for input(s): PROT, ALBUMIN, AST, ALT, ALKPHOS, BILITOT, BILIDIR, IBILI in the last 72 hours. No results for input(s): CHOL in the last 72 hours. No results for input(s): PROTIME in the last 72 hours.  Imaging: Imaging results have been reviewed and Dg Chest Port 1 View  Result Date: 11/26/2017 CLINICAL DATA:  Right-sided chest tube removal. EXAM: PORTABLE CHEST 1 VIEW COMPARISON:  Chest x-ray from same day CT 6:21 a.m. FINDINGS: Interval removal of right-sided chest tube. No pneumothorax. Unchanged irregular, masslike pleural thickening throughout the right hemithorax. The left lung is clear. Stable cardiomediastinal  silhouette. IMPRESSION: 1. No removal of right-sided chest tube.  No pneumothorax. 2. Unchanged irregular, masslike pleural thickening throughout the right hemithorax. Electronically Signed   By: Titus Dubin M.D.   On: 11/26/2017 11:48    Cardiac Studies:  Assessment/Plan:  Status post Malignant right pleural effusion Non-small cell lung CA with mets S/P stroke Hypercalcemia of malignancy COPD Hypertension Obesity Type II DM Plan Continue present management Encourage ambulation Check labs and x-ray in a.m.   LOS: 8 days    Charolette Forward 11/28/2017, 11:17 AM

## 2017-11-28 NOTE — Evaluation (Signed)
Occupational Therapy Evaluation Patient Details Name: [name] MRN: [id number] DOB: Oct 10, 1946 Today's Date: 11/28/2017    History of Present Illness Pt is a 72 y.o. 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 CXR and chest CT positive for R-side pleural effusion with possible pneumonia/empyema. Peritnent PMH includes a-fib with RVR, DM, COPD, HTN, vertigo. Of note, admitted 1 month ago with finding of metastatic lesin in left cerebral hemisphere.   Clinical Impression   Pt admitted with the above diagnoses and presents with below problem list. Pt will benefit from continued acute OT to address the below listed deficits and maximize independence with basic ADLs prior to d/c to next venue. Pt is independent with ADLs at baseline. Pt currently max A for UB ADLs, mod-max A +2 for LB ADLs. Lethargy and fatigue limiting session. Also presents with decreased cognition and awareness compared to baseline. Pt reporting some R flank pain, nursing notified. Pt completed bed mobility (max A) and sit EOB 3 minutes (mod A) with pt washing face during that time. Pt fatigued quickly, declined sit<>stand and requested to lie back down. Spouse present throughout session.      Follow Up Recommendations  SNF;Supervision/Assistance - 24 hour    Equipment Recommendations  Other (comment)(defer to next venue)    Recommendations for Other Services       Precautions / Restrictions Precautions Precautions: Fall Restrictions Weight Bearing Restrictions: No      Mobility Bed Mobility Overal bed mobility: Needs Assistance Bed Mobility: Supine to Sit;Sit to Supine     Supine to sit: Max assist;HOB elevated Sit to supine: Mod assist   General bed mobility comments: Max A for all aspects. Lethargic. Pad utilized to advance hips.    Transfers                 General transfer comment: pt declined sit<>stand due to fatigue.     Balance Overall balance assessment: Needs assistance Sitting-balance support: Single extremity supported;Feet supported Sitting balance-Leahy Scale: Poor Sitting balance - Comments: Pt initially needing max A for balance, improved to mod A with occasional min A to min guard. Fatigued quickly after about 3 minutes.                                    ADL either performed or assessed with clinical judgement   ADL Overall ADL's : Needs assistance/impaired Eating/Feeding: Maximal assistance;Bed level   Grooming: Wash/dry face;Maximal assistance;Sitting   Upper Body Bathing: Maximal assistance;Sitting   Lower Body Bathing: Maximal assistance;+2 for physical assistance;Sit to/from stand   Upper Body Dressing : Maximal assistance;Sitting   Lower Body Dressing: Maximal assistance;+2 for physical assistance;Sit to/from stand                 General ADL Comments: Pt completed bed mobility and sat EOB about 3 minutes. Washed face with mod A for balance. Fatigued quickly     Museum/gallery curator      Pertinent Vitals/Pain Pain Assessment: Faces Faces Pain Scale: Hurts even more Pain Location: Chest wall; Generalized Pain Descriptors / Indicators: Discomfort Pain Intervention(s): Limited activity within patient's tolerance;Monitored during session;Repositioned     Hand Dominance Right   Extremity/Trunk Assessment  Upper Extremity Assessment Upper Extremity Assessment: Generalized weakness   Lower Extremity Assessment Lower Extremity Assessment: Defer to PT evaluation;Generalized weakness       Communication Communication Communication: No difficulties   Cognition Arousal/Alertness: Lethargic;Suspect due to medications Behavior During Therapy: Flat affect Overall Cognitive Status: Impaired/Different from baseline Area of  Impairment: Attention;Following commands;Safety/judgement;Awareness;Problem solving                   Current Attention Level: Sustained   Following Commands: Follows one step commands inconsistently Safety/Judgement: Decreased awareness of safety;Decreased awareness of deficits Awareness: Intellectual Problem Solving: Slow processing;Difficulty sequencing;Requires verbal cues;Requires tactile cues;Decreased initiation     General Comments  Husband present    Exercises     Shoulder Instructions      Home Living Family/patient expects to be discharged to:: Private residence Living Arrangements: Spouse/significant other Available Help at Discharge: Available 24 hours/day;Family Type of Home: House Home Access: Stairs to enter [employer] of Steps: 2 Entrance Stairs-Rails: None Home Layout: One level     Bathroom Shower/Tub: Corporate investment banker: Standard Bathroom Accessibility: Yes How Accessible: Accessible via wheelchair;Accessible via walker Home Equipment: Gilford Rile - 2 wheels          Prior Functioning/Environment Level of Independence: Independent        Comments: computer, card and board games        OT Problem List: Decreased strength;Decreased activity tolerance;Impaired balance (sitting and/or standing);Decreased cognition;Decreased safety awareness;Decreased knowledge of use of DME or AE;Decreased knowledge of precautions;Cardiopulmonary status limiting activity;Pain      OT Treatment/Interventions: Self-care/ADL training;Therapeutic exercise;Energy conservation;DME and/or AE instruction;Therapeutic activities;Patient/family education;Balance training    OT Goals(Current goals can be found in the care plan section) Acute Rehab OT Goals Patient Stated Goal: Get stronger OT Goal Formulation: With patient/family Time For Goal Achievement: 12/12/17 Potential to Achieve Goals: Good ADL Goals Pt Will Perform Grooming:  with min assist;sitting Pt Will Perform Upper Body Bathing: with min assist;sitting Pt Will Perform Lower Body Bathing: with min assist;sit to/from stand Pt Will Transfer to Toilet: with min assist;with +2 assist;bedside commode Pt Will Perform Toileting - Clothing Manipulation and hygiene: with min assist;sit to/from stand Additional ADL Goal #1: Pt will complete bed mobility at min A level to prepare for OOB ADLs.  OT Frequency: Min 2X/week   Barriers to D/C:            Co-evaluation              AM-PAC PT "6 Clicks" Daily Activity     Outcome Measure Help from another person eating meals?: A Lot Help from another person taking care of personal grooming?: A Lot Help from another person toileting, which includes using toliet, bedpan, or urinal?: A Lot Help from another person bathing (including washing, rinsing, drying)?: A Lot Help from another person to put on and taking off regular upper body clothing?: A Lot Help from another person to put on and taking off regular lower body clothing?: A Lot 6 Click Score: 12   End of Session Equipment Utilized During Treatment: Oxygen Nurse Communication: Other (comment)(Pt reporting pain on right side, Monai need pain meds. )  Activity Tolerance: Patient limited by fatigue;Patient limited by lethargy Patient left: in bed;with call bell/phone within reach;with family/visitor present  OT Visit Diagnosis: Unsteadiness on feet (R26.81);Muscle weakness (generalized) (M62.81);Other symptoms and signs involving cognitive function;Pain                Time: 3532-9924 OT  Time Calculation (min): 22 min Charges:  OT General Charges $OT Visit: 1 Visit OT Evaluation $OT Eval Low Complexity: 1 Low G-Codes:      Hortencia Pilar 11/28/2017, 9:36 AM

## 2017-11-29 ENCOUNTER — Inpatient Hospital Stay (HOSPITAL_COMMUNITY): Payer: Medicare HMO

## 2017-11-29 DIAGNOSIS — C3481 Malignant neoplasm of overlapping sites of right bronchus and lung: Secondary | ICD-10-CM

## 2017-11-29 LAB — BASIC METABOLIC PANEL
ANION GAP: 11 (ref 5–15)
BUN: 25 mg/dL — ABNORMAL HIGH (ref 6–20)
CO2: 24 mmol/L (ref 22–32)
Chloride: 101 mmol/L (ref 101–111)
Creatinine, Ser: 1.55 mg/dL — ABNORMAL HIGH (ref 0.44–1.00)
GFR calc non Af Amer: 33 mL/min — ABNORMAL LOW (ref >60)
GFR, EST AFRICAN AMERICAN: 38 mL/min — AB (ref >60)
Glucose, Bld: 130 mg/dL — ABNORMAL HIGH (ref 65–99)
POTASSIUM: 4 mmol/L (ref 3.5–5.1)
Sodium: 136 mmol/L (ref 135–145)

## 2017-11-29 LAB — CBC
HCT: 36.8 % (ref 36.0–46.0)
HEMOGLOBIN: 11.8 g/dL — AB (ref 12.0–15.0)
MCH: 27.8 pg (ref 26.0–34.0)
MCHC: 32.1 g/dL (ref 30.0–36.0)
MCV: 86.6 fL (ref 78.0–100.0)
Platelets: 452 10*3/uL — ABNORMAL HIGH (ref 150–400)
RBC: 4.25 MIL/uL (ref 3.87–5.11)
RDW: 14.2 % (ref 11.5–15.5)
WBC: 13.9 10*3/uL — ABNORMAL HIGH (ref 4.0–10.5)

## 2017-11-29 LAB — GLUCOSE, CAPILLARY
GLUCOSE-CAPILLARY: 123 mg/dL — AB (ref 65–99)
GLUCOSE-CAPILLARY: 99 mg/dL (ref 65–99)
Glucose-Capillary: 104 mg/dL — ABNORMAL HIGH (ref 65–99)
Glucose-Capillary: 126 mg/dL — ABNORMAL HIGH (ref 65–99)

## 2017-11-29 MED ORDER — ZOLEDRONIC ACID 4 MG/5ML IV CONC
3.0000 mg | Freq: Once | INTRAVENOUS | Status: AC
  Administered 2017-11-29: 3 mg via INTRAVENOUS
  Filled 2017-11-29: qty 3.75

## 2017-11-29 MED ORDER — ZOLEDRONIC ACID 4 MG/5ML IV CONC: 3.0000 mg | Freq: Once | INTRAVENOUS | Status: DC

## 2017-11-29 MED ORDER — SODIUM CHLORIDE 0.9 % IV SOLN
INTRAVENOUS | Status: DC
  Administered 2017-11-29 – 2017-11-30 (×3): via INTRAVENOUS

## 2017-11-29 NOTE — Progress Notes (Signed)
PULMONARY / CRITICAL CARE MEDICINE   Name: [name] MRN: [id number] DOB: Aug 12, 1946    ADMISSION DATE:  11/19/2017   CHIEF COMPLAINT: Altered mental status with hypercalcemia and a loculated right pleural effusion in a patient with metastatic non-small cell lung cancer  HISTORY OF PRESENT ILLNESS:        72 y.o. Female former smoker ( quit 8/ 2018 ) with a PMHx of metastatic NSCLC  who presents to the ED via EMS with altered mental status.per daughter the patient has been sick for the past 4 days. Patient has been more sleepy and confused for three days and has a diminished appetite recently. Earlier on 11/19/17 they found her sweating while in her chair sitting. Also reports chest pain that is reproducible on palpation of anterior and lateral right chest wall described as sharp. Pt also has a mildly productive cough. 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  She is subsequently lethargic and interactive but interactive for me, reports some discomfort in the right chest, but improved dyspnea.  She is no longer requiring BiPAP and is able to speak in complete sentences.    SUBJECTIVE:  Chest tube removed 1/4 as it was not draining.  CXR today 1/7 without significant change from prior.   VITAL SIGNS: BP (!) 138/56 (BP Location: Left Leg)   Pulse 67   Temp 98 F (36.7 C) (Axillary)   Resp 17   Ht 5\' 7"  (1.702 m)   Wt 92.7 kg (204 lb 5.9 oz)   SpO2 95%   BMI 32.01 kg/m      VENTILATOR SETTINGS:    INTAKE / OUTPUT: I/O last 3 completed shifts: In: -  Out: 2526 [Urine:2526]  PHYSICAL EXAMINATION: General: Adult female, no distress. HEENT: [place] / AT. EOMI. MM  dry. PSY: Dull affect. Neuro: Awake, follows commands intermittently. CV: RRR, no M/R/G. PULM: Slightly diminished on right, otherwise clear. GI: Obese, soft nontender. Extremities: warm/dry, 1+ edema. Skin: no rashes or lesions.   LABS:  BMET Recent Labs  Lab 11/24/17 0319 11/26/17 0313 11/29/17 0218  NA 130* 134* 136  K 3.7 4.0 4.0  CL 100* 101 101  CO2 23 24 24   BUN 18 19 25*  CREATININE 1.65* 1.55* 1.55*  GLUCOSE 233* 147* 130*    Electrolytes Recent Labs  Lab 11/23/17 0327 11/24/17 0319 11/26/17 0313 11/29/17 0218  CALCIUM 10.8* 10.2 11.7* >15.0*  MG 2.1  --   --   --     CBC Recent Labs  Lab 11/23/17 0327 11/29/17 0218  WBC 12.6* 13.9*  HGB 12.2 11.8*  HCT 37.4 36.8  PLT 331 452*    Coag's No results for input(s): APTT, INR in the last 168 hours.  Sepsis Markers Recent Labs  Lab 11/23/17 0544  LATICACIDVEN 2.5*    ABG No results for input(s): PHART, PCO2ART, PO2ART in the last 168 hours.  Liver Enzymes No results for input(s): AST, ALT, ALKPHOS, BILITOT, ALBUMIN in the last 168 hours.  Cardiac Enzymes No results for input(s): TROPONINI, PROBNP in the last 168 hours.  Glucose Recent Labs  Lab 11/27/17 2125 11/28/17 0626 11/28/17 1139 11/28/17 1612 11/28/17 2125 11/29/17 0617  GLUCAP 132* 129*  114* 118* 120* 123*    Imaging Dg Chest 2 View  Result Date: 11/29/2017 CLINICAL DATA:  Metastatic lung carcinoma and prior placement with subsequent removal of pigtail drainage catheter and right pleural space to treat loculated malignant pleural effusion. EXAM: CHEST  2 VIEW COMPARISON:  11/26/2017 FINDINGS: No significant change in appearance of right apical lung mass and lobulated pleural masses in the right hemithorax with reduced ventilation of the right lung. No findings to suggest significant reaccumulation of right pleural fluid. Mild atelectasis present at the left lung base. IMPRESSION: Stable chest x-ray demonstrating right apical  lung mass and lobulated right pleural masses. Electronically Signed   By: Aletta Edouard M.D.   On: 11/29/2017 08:08     CULTURES: Pleural fluid 12/19 > neg  ANTIBIOTICS: Rocephin 12/29 > 1/6   SIGNIFICANT EVENTS: Chest drain placed on 12/30 > 1/4  DISCUSSION: This is a 72 year old with metastatic non-small cell lung cancer, including history of brain metastases with associated seizure activity.  She presented with altered mental status and was found to be hypercalcemic.  In addition she had a right pleural effusion for which she had pigtail catheter placed for drainage.  Cytology confirmed malignant effusion (poorly differentiated carcinoma).  11/26/2017 follow-up chest CT revealed loculated right effusion with no increase in size with good chest tube placement.  Portable chest x-ray showed no change in right effusion.   Chest tube was subsequently removed 1/4.  Repeat CXR 1/7 does not show much change; stable with similar appearing effusion.  Would f/u with oncology as outpatient for continued treatment of her metastatic cancer.  If effusion were to re-accumulate, could entertain repeat thora vs pleur-x vs talc pleurodesis per CVTS.   Nothing further to add at this point.  PCCM will sign off.  Please do not hesitate to call us back if we can be of any further assistance.   Montey Hora, [facility] Pager: [phone]  or [phone] 11/29/2017, 12:10 PM

## 2017-11-29 NOTE — Care Management Important Message (Signed)
Important Message  Patient Details  Name: [name] MRN: [id number] Date of Birth: 1946-06-15   Medicare Important Message Given:  Yes    Lynn Sissel Abena 11/29/2017, 9:00 AM

## 2017-11-29 NOTE — NC FL2 (Signed)
[place] LEVEL OF CARE SCREENING TOOL     IDENTIFICATION  Patient Name: [name] Birthdate: 04-May-1946 Sex: female Admission Date (Current Location): 11/19/2017  [facility] and [place] Number:  Herbalist and Address:  [facility] [place], [place] [address], [place], [place] [address]      Provider Number: [id number]  Attending Physician Name and Address:  Dixie Dials, MD  Relative Name and Phone Number:  Clint Lipps, spouse, [phone]    Current Level of Care: Hospital Recommended Level of Care: [place] Prior Approval Number:    Date Approved/Denied:   PASRR Number: [id number]  Discharge Plan: SNF    Current Diagnoses: Patient Active Problem List   Diagnosis Date Noted  . Palliative care by specialist   . Pleural effusion   . Acute sepsis ([place]) 11/20/2017  . Cerebral edema (HCC)   . Muscle spasm   . Diabetes mellitus type 2 in obese ([place])   . Labile blood glucose   . Cough   . Hypoalbuminemia due to protein-calorie malnutrition ([place])   . Leukocytosis   . Malignant neoplasm of overlapping sites of right lung ([place]) 10/11/2017  . Brain metastases ([place])   . Seizure prophylaxis   . Hyperlipidemia   . Slow transit constipation   . Hyponatremia   . Stage 3 chronic kidney disease ([place])   . Type 2 diabetes mellitus with peripheral neuropathy (HCC)   . Benign essential HTN   . Tobacco abuse   . Focal seizure ([place]) 10/02/2017  . GERD (gastroesophageal reflux disease) 08/16/2017  . Lung mass 08/16/2017  . Shortness of breath 07/05/2017    Class: Acute  . Vaginal odor 02/10/2017  . Dysuria 02/10/2017  . Osteoarthritis of spine with radiculopathy, cervical region 12/25/2016  . COPD (chronic obstructive pulmonary disease) ([place]) 10/11/2015  . Orthostatic hypotension 06/18/2013  . Numbness and tingling in right hand 11/10/2011    Class: Acute  . Hypertension 11/10/2011  . Cervical disc disease 11/10/2011   Class: Chronic  . DM II (diabetes mellitus, type II), controlled ([place]) 11/10/2011    Class: Chronic    Orientation RESPIRATION BLADDER Height & Weight     Self, Time, Situation, Place  O2(Nasal cannula 3L) Incontinent, External catheter Weight: 92.7 kg (204 lb 5.9 oz) Height:  5\' 7"  (170.2 cm)  BEHAVIORAL SYMPTOMS/MOOD NEUROLOGICAL BOWEL NUTRITION STATUS      Incontinent Diet(Please see DC Summary)  AMBULATORY STATUS COMMUNICATION OF NEEDS Skin   Limited Assist Verbally Normal                       Personal Care Assistance Level of Assistance  Bathing, Feeding, Dressing Bathing Assistance: Maximum assistance Feeding assistance: Limited assistance Dressing Assistance: Limited assistance     Functional Limitations Info             SPECIAL CARE FACTORS FREQUENCY  PT (By licensed PT), OT (By licensed OT)     PT Frequency: 5x/week OT Frequency: 3x/week            Contractures      Additional Factors Info  Code Status, Allergies, Insulin Sliding Scale Code Status Info: Full Allergies Info: Oxycodone, Tramadol Hcl   Insulin Sliding Scale Info: 3x daily with meals       Current Medications (11/29/2017):  This is the current hospital active medication list Current Facility-Administered Medications  Medication Dose Route Frequency Provider Last Rate Last Dose  . 0.9 %  sodium chloride infusion   Intravenous Continuous Charolette Forward, MD 50 mL/hr at 11/29/17 0446    . acetaminophen (TYLENOL) tablet 500 mg  500 mg Oral Q6H PRN Dixie Dials, MD   500 mg at 11/27/17 1650  . albuterol (PROVENTIL) (2.5 MG/3ML) 0.083% nebulizer solution 2.5 mg  2.5 mg Nebulization TID Dixie Dials, MD   2.5 mg at 11/29/17 0827  . albuterol (PROVENTIL) (2.5 MG/3ML) 0.083% nebulizer solution 3 mL  3 mL Inhalation Q6H PRN Dixie Dials, MD   3 mL at 11/26/17 2106  . ALPRAZolam Duanne Moron) tablet 0.25 mg  0.25 mg Oral Q8H PRN Dixie Dials, MD   0.25 mg at 11/28/17 2136  . alum & mag  hydroxide-simeth (MAALOX/MYLANTA) 200-200-20 MG/5ML suspension 30 mL  30 mL Oral Q6H PRN Dixie Dials, MD   30 mL at 11/25/17 1434  . amiodarone (PACERONE) tablet 200 mg  200 mg Oral Daily Dixie Dials, MD   200 mg at 11/29/17 0804  . diltiazem (CARDIZEM CD) 24 hr capsule 120 mg  120 mg Oral Daily Dixie Dials, MD   120 mg at 11/29/17 0803  . diphenhydrAMINE (BENADRYL) capsule 25 mg  25 mg Oral QHS PRN Dixie Dials, MD   25 mg at 11/28/17 0246  . furosemide (LASIX) tablet 40 mg  40 mg Oral Daily Dixie Dials, MD   40 mg at 11/29/17 0803  . heparin injection 5,000 Units  5,000 Units Subcutaneous Q8H Dixie Dials, MD   5,000 Units at 11/29/17 0533  . hydrocortisone cream 1 %   Topical BID PRN Dixie Dials, MD      . HYDROmorphone (DILAUDID) tablet 1 mg  1 mg Oral Q6H PRN Charolette Forward, MD   1 mg at 11/28/17 1854  . Influenza vac split quadrivalent PF (FLUZONE HIGH-DOSE) injection 0.5 mL  0.5 mL Intramuscular Tomorrow-1000 Dixie Dials, MD      . insulin aspart (novoLOG) injection 0-15 Units  0-15 Units Subcutaneous TID WC Dixie Dials, MD   2 Units at 11/29/17 6659  . levETIRAcetam (KEPPRA) tablet 500 mg  500 mg Oral BID Dixie Dials, MD   500 mg at 11/29/17 0804  . linagliptin (TRADJENTA) tablet 5 mg  5 mg Oral Daily Dixie Dials, MD   5 mg at 11/29/17 0804  . MEDLINE mouth rinse  15 mL Mouth Rinse BID Dixie Dials, MD   15 mL at 11/28/17 2100  . metFORMIN (GLUCOPHAGE) tablet 500 mg  500 mg Oral BID WC Dixie Dials, MD   500 mg at 11/29/17 0803  . metoprolol succinate (TOPROL-XL) 24 hr tablet 25 mg  25 mg Oral Daily Dixie Dials, MD   25 mg at 11/29/17 0804  . midazolam (VERSED) injection   Intravenous PRN Hoss, Arthur, MD   0.5 mg at 11/21/17 1713  . pantoprazole (PROTONIX) EC tablet 40 mg  40 mg Oral Daily Dixie Dials, MD   40 mg at 11/29/17 0803  . potassium chloride (K-DUR,KLOR-CON) CR tablet 10 mEq  10 mEq Oral Daily Dixie Dials, MD   10 mEq at 11/29/17 0803  . sodium  chloride flush (NS) 0.9 % injection 10-40 mL  10-40 mL Intracatheter PRN Dixie Dials, MD   10 mL at 11/26/17 1606  . sodium chloride flush (NS) 0.9 % injection 5 mL  5 mL Intravenous Q8H Hoss, Arthur, MD   5 mL at 11/28/17 1828  . umeclidinium bromide (INCRUSE ELLIPTA) 62.5 MCG/INH 1 puff  1 puff Inhalation Daily Dixie Dials, MD   1  puff at 11/28/17 0753   Facility-Administered Medications Ordered in Other Encounters  Medication Dose Route Frequency Provider Last Rate Last Dose  . fentaNYL (SUBLIMAZE) injection    Anesthesia Intra-op Candis Shine, CRNA   50 mcg at 09/09/17 0718  . lactated ringers infusion    Continuous PRN Harder, Rebeca Alert, CRNA      . lactated ringers infusion    Continuous PRN Harder, Rebeca Alert, CRNA      . midazolam (VERSED) 5 MG/5ML injection    Anesthesia Intra-op Candis Shine, CRNA   1 mg at 09/09/17 3254     Discharge Medications: Please see discharge summary for a list of discharge medications.  Relevant Imaging Results:  Relevant Lab Results:   Additional Information SSN: [place], [place]

## 2017-11-29 NOTE — Progress Notes (Signed)
  Patient ID: [name], female   DOB: 10/25/1946, 72 y.o.   MRN: [id number]  This NP visited patient at the bedside as a follow up to  yesterday's [place].  Patient remains lethargic, daughter/Takesha at bedside.  Family inquiring about hospice services in the home.  Questions and concerns addressed.  Received call from husband and he plans to meet with me in the morning at 0800.  Family is leaning toward a more comfort approach and taking the patient home with hospice.  Discussed with husband the importance of continued conversation with his family and their  medical providers regarding overall plan of care  ensuring decisions are within the context of the patient/family values and GOCs.  Emotional support offered  Time in 1630         Time out   1650  Total time spent on the unit was 20 minutes   Greater than 50% of the time was spent in counseling and coordination of care  Wadie Lessen NP  Palliative Medicine Team Team Phone # [phone] Pager [phone]

## 2017-11-29 NOTE — Progress Notes (Signed)
Patient refused BIPAP for HS.

## 2017-11-29 NOTE — Clinical Social Work Note (Addendum)
CSW spoke with pt's husband and he defered CSW to contact pt's daughter. CSW spoke with pt's daughter and son via telephone. They are refusing SNF at this time. Pt is only alert to self. Son stated mother would not want to go to a facility. Pt's son states pt has had three PT coming into to home prior to hospitalization. Son lives in [place] and is taking FMLA to come home and stay with mother so someone would be with her 24/7. CSW will notify RNCM. Pt's son contact is [phone] Jaquelyn Bitter [place]). Clinical Social Worker will sign off for now as social work intervention is no longer needed. Please consult [place] again if new need arises.  [place], [place]

## 2017-11-29 NOTE — Progress Notes (Signed)
CRITICAL VALUE ALERT  Critical Value:  Calcium >15.0  Date & Time Notied: 11/29/2017  Provider Notified: Caren Hazy MD  Orders Received/Actions taken: Zometa 3mg  once, Normal saline 50cc/hr, consult Oncology.

## 2017-11-29 NOTE — Plan of Care (Addendum)
  Pain Managment: General experience of comfort will improve 11/29/2017 2338 - Not Progressing by Jaymes Graff, RN  Pt very restless today and this pm, pulling leads, O2, finger probe, etc off repeatedly. Will not answer questions, only makes eye contact. Some relief after PRN Xanax. O2 sats ok, 90-94% on RA  Around midnight, pt nods when asked if she is in pain. Gave PRN dilaudid w/ relief.

## 2017-11-29 NOTE — Progress Notes (Addendum)
Ref: Dixie Dials, MD   Subjective:  Weak. Elevated calcium. Loculated right pleural effusion with mass persist on chest x-ray. Discussed care with Oncologist, Dr. Julien Nordmann.  Objective:  Vital Signs in the last 24 hours: Temp:  [97.9 F (36.6 C)-99.2 F (37.3 C)] 99.2 F (37.3 C) (01/07 1116) Pulse Rate:  [30-74] 74 (01/07 1116) Cardiac Rhythm: Normal sinus rhythm;Heart block (01/07 0805) Resp:  [16-21] 21 (01/07 1116) BP: (105-138)/(46-72) 132/66 (01/07 1116) SpO2:  [92 %-100 %] 97 % (01/07 1116) Weight:  [92.7 kg (204 lb 5.9 oz)] 92.7 kg (204 lb 5.9 oz) (01/07 0050)  Physical Exam: BP Readings from Last 1 Encounters:  11/29/17 132/66     Wt Readings from Last 1 Encounters:  11/29/17 92.7 kg (204 lb 5.9 oz)    Weight change: 0.2 kg (7.1 oz) Body mass index is 32.01 kg/m. HEENT: [place]/AT, Eyes-Brown, PERL, EOMI, Conjunctiva-Pink, Sclera-Non-icteric Neck: No JVD, No bruit, Trachea midline. Lungs:  Clearing, Bilateral. Cardiac:  Regular rhythm, normal S1 and S2, no S3. II/VI systolic murmur. Abdomen:  Soft, non-tender. BS present. Extremities:  No edema present. No cyanosis. No clubbing. CNS: AxOx1, Cranial nerves grossly intact, moves all 4 extremities.  Skin: Warm and dry.   Intake/Output from previous day: 01/06 0701 - 01/07 0700 In: -  Out: 400 [Urine:400]    Lab Results: BMET    Component Value Date/Time   NA 136 11/29/2017 0218   NA 134 (L) 11/26/2017 0313   NA 130 (L) 11/24/2017 0319   NA 139 10/28/2017 1242   NA 131 (L) 09/28/2017 1355   K 4.0 11/29/2017 0218   K 4.0 11/26/2017 0313   K 3.7 11/24/2017 0319   K 3.8 10/28/2017 1242   K 4.5 09/28/2017 1355   CL 101 11/29/2017 0218   CL 101 11/26/2017 0313   CL 100 (L) 11/24/2017 0319   CO2 24 11/29/2017 0218   CO2 24 11/26/2017 0313   CO2 23 11/24/2017 0319   CO2 24 10/28/2017 1242   CO2 23 09/28/2017 1355   GLUCOSE 130 (H) 11/29/2017 0218   GLUCOSE 147 (H) 11/26/2017 0313   GLUCOSE 233 (H)  11/24/2017 0319   GLUCOSE 164 (H) 10/28/2017 1242   GLUCOSE 248 (H) 09/28/2017 1355   BUN 25 (H) 11/29/2017 0218   BUN 19 11/26/2017 0313   BUN 18 11/24/2017 0319   BUN 15.4 10/28/2017 1242   BUN 25.7 09/28/2017 1355   CREATININE 1.55 (H) 11/29/2017 0218   CREATININE 1.55 (H) 11/26/2017 0313   CREATININE 1.65 (H) 11/24/2017 0319   CREATININE 1.1 10/28/2017 1242   CREATININE 1.2 (H) 09/28/2017 1355   CALCIUM >15.0 (HH) 11/29/2017 0218   CALCIUM 11.7 (H) 11/26/2017 0313   CALCIUM 10.2 11/24/2017 0319   CALCIUM 10.4 10/28/2017 1242   CALCIUM 9.1 09/28/2017 1355   GFRNONAA 33 (L) 11/29/2017 0218   GFRNONAA 33 (L) 11/26/2017 0313   GFRNONAA 30 (L) 11/24/2017 0319   GFRAA 38 (L) 11/29/2017 0218   GFRAA 38 (L) 11/26/2017 0313   GFRAA 35 (L) 11/24/2017 0319   CBC    Component Value Date/Time   WBC 13.9 (H) 11/29/2017 0218   RBC 4.25 11/29/2017 0218   HGB 11.8 (L) 11/29/2017 0218   HGB 13.0 10/28/2017 1242   HCT 36.8 11/29/2017 0218   HCT 39.6 10/28/2017 1242   PLT 452 (H) 11/29/2017 0218   PLT 346 10/28/2017 1242   MCV 86.6 11/29/2017 0218   MCV 89.0 10/28/2017 1242   MCH  27.8 11/29/2017 0218   MCHC 32.1 11/29/2017 0218   RDW 14.2 11/29/2017 0218   RDW 13.8 10/28/2017 1242   LYMPHSABS 1.9 11/23/2017 0327   LYMPHSABS 2.6 10/28/2017 1242   MONOABS 1.4 (H) 11/23/2017 0327   MONOABS 0.8 10/28/2017 1242   EOSABS 0.2 11/23/2017 0327   EOSABS 0.3 10/28/2017 1242   BASOSABS 0.0 11/23/2017 0327   BASOSABS 0.0 10/28/2017 1242   HEPATIC Function Panel Recent Labs    11/01/17 1105 11/19/17 1950 11/21/17 1211  PROT 7.1 6.4* 6.3*   HEMOGLOBIN A1C No components found for: HGA1C,  MPG CARDIAC ENZYMES Lab Results  Component Value Date   CKTOTAL 95 01/01/2009   CKMB 0.8 01/01/2009   TROPONINI <0.01        NO INDICATION OF MYOCARDIAL INJURY. 01/01/2009   TROPONINI <0.01        NO INDICATION OF MYOCARDIAL INJURY. 01/01/2009   TROPONINI <0.01        NO INDICATION  OF MYOCARDIAL INJURY. 12/31/2008   BNP No results for input(s): PROBNP in the last 8760 hours. TSH No results for input(s): TSH in the last 8760 hours. CHOLESTEROL No results for input(s): CHOL in the last 8760 hours.  Scheduled Meds: . albuterol  2.5 mg Nebulization TID  . amiodarone  200 mg Oral Daily  . diltiazem  120 mg Oral Daily  . furosemide  40 mg Oral Daily  . heparin  5,000 Units Subcutaneous Q8H  . Influenza vac split quadrivalent PF  0.5 mL Intramuscular Tomorrow-1000  . insulin aspart  0-15 Units Subcutaneous TID WC  . levETIRAcetam  500 mg Oral BID  . linagliptin  5 mg Oral Daily  . mouth rinse  15 mL Mouth Rinse BID  . metFORMIN  500 mg Oral BID WC  . metoprolol succinate  25 mg Oral Daily  . pantoprazole  40 mg Oral Daily  . potassium chloride  10 mEq Oral Daily  . sodium chloride flush  5 mL Intravenous Q8H  . umeclidinium bromide  1 puff Inhalation Daily   Continuous Infusions: . sodium chloride 50 mL/hr at 11/29/17 0446   PRN Meds:.acetaminophen, albuterol, ALPRAZolam, alum & mag hydroxide-simeth, diphenhydrAMINE, hydrocortisone cream, HYDROmorphone, midazolam, sodium chloride flush  Assessment/Plan: Weakness Severe hypercalcemia S/P malignant right pleural effusion Hypercalcemia of malignancy S/P stroke  COPD Hypertension Type Ii DM Obesity Severe hypercalcemia Acute renal failure  IV Zometa for hypercalcemia. Not a candidate for IP cancer treatment. She has OP appointment. Increase activity. Consider SNF.   LOS: 9 days    Dixie Dials  MD  11/29/2017, 1:55 PM

## 2017-11-30 ENCOUNTER — Encounter: Payer: Medicare HMO | Admitting: Thoracic Surgery (Cardiothoracic Vascular Surgery)

## 2017-11-30 LAB — GLUCOSE, CAPILLARY
GLUCOSE-CAPILLARY: 99 mg/dL (ref 65–99)
GLUCOSE-CAPILLARY: 115 mg/dL — AB (ref 65–99)
Glucose-Capillary: 137 mg/dL — ABNORMAL HIGH (ref 65–99)
Glucose-Capillary: 129 mg/dL — ABNORMAL HIGH (ref 65–99)

## 2017-11-30 NOTE — Progress Notes (Signed)
Ref: Dixie Dials, MD   Subjective:  Confusion worse at times. T max 99.2 degree F. Family thinking about home care.  Objective:  Vital Signs in the last 24 hours: Temp:  [96.6 F (35.9 C)-99.2 F (37.3 C)] 96.6 F (35.9 C) (01/08 0450) Pulse Rate:  [67-153] 67 (01/08 0450) Cardiac Rhythm: Heart block (01/08 0704) Resp:  [18-33] 18 (01/08 0450) BP: (114-141)/(51-66) 136/52 (01/08 0450) SpO2:  [89 %-97 %] 89 % (01/08 0811) Weight:  [90.5 kg (199 lb 8.3 oz)] 90.5 kg (199 lb 8.3 oz) (01/08 0450)  Physical Exam: BP Readings from Last 1 Encounters:  11/30/17 (!) 136/52     Wt Readings from Last 1 Encounters:  11/30/17 90.5 kg (199 lb 8.3 oz)    Weight change: -2.2 kg (-13.6 oz) Body mass index is 31.25 kg/m. HEENT: [place]/AT, Eyes-Brown, Conjunctiva-Pink, Sclera-Non-icteric Neck: No JVD, No bruit, Trachea midline. Lungs:  Clearing with RLL dullness. Cardiac:  Regular rhythm, normal S1 and S2, no S3. II/VI systolic murmur. Abdomen:  Soft, non-tender. BS present. Extremities:  No edema present. No cyanosis. No clubbing. CNS: AxOx1, Cranial nerves grossly intact, moves all 4 extremities.  Skin: Warm and dry.   Intake/Output from previous day: 01/07 0701 - 01/08 0700 In: 1211.7 [P.O.:100; I.V.:1111.7] Out: 300 [Urine:300]    Lab Results: BMET    Component Value Date/Time   NA 136 11/29/2017 0218   NA 134 (L) 11/26/2017 0313   NA 130 (L) 11/24/2017 0319   NA 139 10/28/2017 1242   NA 131 (L) 09/28/2017 1355   K 4.0 11/29/2017 0218   K 4.0 11/26/2017 0313   K 3.7 11/24/2017 0319   K 3.8 10/28/2017 1242   K 4.5 09/28/2017 1355   CL 101 11/29/2017 0218   CL 101 11/26/2017 0313   CL 100 (L) 11/24/2017 0319   CO2 24 11/29/2017 0218   CO2 24 11/26/2017 0313   CO2 23 11/24/2017 0319   CO2 24 10/28/2017 1242   CO2 23 09/28/2017 1355   GLUCOSE 130 (H) 11/29/2017 0218   GLUCOSE 147 (H) 11/26/2017 0313   GLUCOSE 233 (H) 11/24/2017 0319   GLUCOSE 164 (H) 10/28/2017 1242   GLUCOSE 248 (H) 09/28/2017 1355   BUN 25 (H) 11/29/2017 0218   BUN 19 11/26/2017 0313   BUN 18 11/24/2017 0319   BUN 15.4 10/28/2017 1242   BUN 25.7 09/28/2017 1355   CREATININE 1.55 (H) 11/29/2017 0218   CREATININE 1.55 (H) 11/26/2017 0313   CREATININE 1.65 (H) 11/24/2017 0319   CREATININE 1.1 10/28/2017 1242   CREATININE 1.2 (H) 09/28/2017 1355   CALCIUM >15.0 (HH) 11/29/2017 0218   CALCIUM 11.7 (H) 11/26/2017 0313   CALCIUM 10.2 11/24/2017 0319   CALCIUM 10.4 10/28/2017 1242   CALCIUM 9.1 09/28/2017 1355   GFRNONAA 33 (L) 11/29/2017 0218   GFRNONAA 33 (L) 11/26/2017 0313   GFRNONAA 30 (L) 11/24/2017 0319   GFRAA 38 (L) 11/29/2017 0218   GFRAA 38 (L) 11/26/2017 0313   GFRAA 35 (L) 11/24/2017 0319   CBC    Component Value Date/Time   WBC 13.9 (H) 11/29/2017 0218   RBC 4.25 11/29/2017 0218   HGB 11.8 (L) 11/29/2017 0218   HGB 13.0 10/28/2017 1242   HCT 36.8 11/29/2017 0218   HCT 39.6 10/28/2017 1242   PLT 452 (H) 11/29/2017 0218   PLT 346 10/28/2017 1242   MCV 86.6 11/29/2017 0218   MCV 89.0 10/28/2017 1242   MCH 27.8 11/29/2017 0218   MCHC  32.1 11/29/2017 0218   RDW 14.2 11/29/2017 0218   RDW 13.8 10/28/2017 1242   LYMPHSABS 1.9 11/23/2017 0327   LYMPHSABS 2.6 10/28/2017 1242   MONOABS 1.4 (H) 11/23/2017 0327   MONOABS 0.8 10/28/2017 1242   EOSABS 0.2 11/23/2017 0327   EOSABS 0.3 10/28/2017 1242   BASOSABS 0.0 11/23/2017 0327   BASOSABS 0.0 10/28/2017 1242   HEPATIC Function Panel Recent Labs    11/01/17 1105 11/19/17 1950 11/21/17 1211  PROT 7.1 6.4* 6.3*   HEMOGLOBIN A1C No components found for: HGA1C,  MPG CARDIAC ENZYMES Lab Results  Component Value Date   CKTOTAL 95 01/01/2009   CKMB 0.8 01/01/2009   TROPONINI <0.01        NO INDICATION OF MYOCARDIAL INJURY. 01/01/2009   TROPONINI <0.01        NO INDICATION OF MYOCARDIAL INJURY. 01/01/2009   TROPONINI <0.01        NO INDICATION OF MYOCARDIAL INJURY. 12/31/2008   BNP No results for  input(s): PROBNP in the last 8760 hours. TSH No results for input(s): TSH in the last 8760 hours. CHOLESTEROL No results for input(s): CHOL in the last 8760 hours.  Scheduled Meds: . albuterol  2.5 mg Nebulization TID  . amiodarone  200 mg Oral Daily  . diltiazem  120 mg Oral Daily  . furosemide  40 mg Oral Daily  . heparin  5,000 Units Subcutaneous Q8H  . Influenza vac split quadrivalent PF  0.5 mL Intramuscular Tomorrow-1000  . insulin aspart  0-15 Units Subcutaneous TID WC  . levETIRAcetam  500 mg Oral BID  . linagliptin  5 mg Oral Daily  . mouth rinse  15 mL Mouth Rinse BID  . metFORMIN  500 mg Oral BID WC  . metoprolol succinate  25 mg Oral Daily  . pantoprazole  40 mg Oral Daily  . potassium chloride  10 mEq Oral Daily  . sodium chloride flush  5 mL Intravenous Q8H  . umeclidinium bromide  1 puff Inhalation Daily   Continuous Infusions: . sodium chloride 50 mL/hr at 11/29/17 2231   PRN Meds:.acetaminophen, albuterol, ALPRAZolam, alum & mag hydroxide-simeth, diphenhydrAMINE, hydrocortisone cream, HYDROmorphone, midazolam, sodium chloride flush  Assessment/Plan: Altered mental status from hypercalcemia  Severe hypercalcemia of malignancy S/P stroke COPD Hypertension Type II DM Obesity Acute renal failure  Discussed hospice care or SNF as opposed to home care. Family to let me know today.   LOS: 10 days    Dixie Dials  MD  11/30/2017, 10:00 AM

## 2017-11-30 NOTE — Progress Notes (Signed)
BIPAP on standy by at bedside.  No distress noted. Patient pulling nebulizer mask off. Daughter at bedside, holding treatment. Patient pushing treatment away.

## 2017-11-30 NOTE — Progress Notes (Signed)
Physical Therapy Treatment Patient Details Name: [name] MRN: [id number] DOB: June 08, 1946 Today's Date: 11/30/2017    History of Present Illness Pt is a 72 y.o. 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 CXR and chest CT positive for R-side pleural effusion with possible pneumonia/empyema. Peritnent PMH includes a-fib with RVR, DM, COPD, HTN, vertigo. Of note, admitted 1 month ago with finding of metastatic lesin in left cerebral hemisphere.    PT Comments    Pt performed transfer from bed to chair with mod to max assistance.  Pt required cues both verbal and tactile during session.  Plan next session for standing trials if patient is able to follow commands for use of device.  Plan for SNF remains appropriate at this time to improve strength and function before returning home.     Follow Up Recommendations  SNF;Supervision/Assistance - 24 hour     Equipment Recommendations  (TBD next venue)    Recommendations for Other Services       Precautions / Restrictions Precautions Precautions: Fall Restrictions Weight Bearing Restrictions: No    Mobility  Bed Mobility Overal bed mobility: Needs Assistance Bed Mobility: Supine to Sit;Sit to Supine     Supine to sit: Mod assist     General bed mobility comments: mod assist for supine to sit.  Pt able to pull on PTA's hand as a railing with support for LE advancement and trunk elevation.  pt with L lateral lean, unclear if LOB or attempting to lie back down in the bed.  Mod assist to right balance to sit edge of bed.  Pt able to scoot to edge of bed with min assistance once in sitting.    Transfers Overall transfer level: Needs assistance Equipment used: None Transfers: Sit to/from [facility] Sit to Stand: Mod assist Stand pivot  transfers: Max assist       General transfer comment: Pt performed sit to stand with cues for hand placement stood x2 trials, on 1st attempt patient unable to achieve erect stance and follow commands for steps from bed to chair.  Pt required return to seated position and chair move closer for stand pivot as patient unable to take steps during session.    Ambulation/Gait Ambulation/Gait assistance: (unable.  )               Stairs            Wheelchair Mobility    Modified Rankin (Stroke Patients Only)       Balance     Sitting balance-Leahy Scale: Poor       Standing balance-Leahy Scale: Poor                              Cognition Arousal/Alertness: Lethargic;Suspect due to medications Behavior During Therapy: Flat affect Overall Cognitive Status: Impaired/Different from baseline Area of Impairment: Attention;Following commands;Safety/judgement;Awareness;Problem solving                   Current Attention Level: Sustained   Following Commands: Follows one step commands inconsistently Safety/Judgement: Decreased awareness of safety;Decreased awareness of deficits Awareness: Intellectual Problem Solving: Slow processing;Difficulty sequencing;Requires verbal cues;Requires tactile cues;Decreased initiation General Comments: Pt with blank stare at times and non-verbal throughout session.        Exercises  General Comments        Pertinent Vitals/Pain Pain Assessment: Faces Pain Location: Chest wall; Generalized Pain Descriptors / Indicators: Discomfort Pain Intervention(s): Monitored during session;Repositioned    Home Living                      Prior Function            PT Goals (current goals can now be found in the care plan section) Acute Rehab PT Goals Patient Stated Goal: Get stronger Potential to Achieve Goals: Good Progress towards PT goals: Progressing toward goals    Frequency    Min  2X/week      PT Plan Current plan remains appropriate    Co-evaluation              AM-PAC PT "6 Clicks" Daily Activity  Outcome Measure  Difficulty turning over in bed (including adjusting bedclothes, sheets and blankets)?: Unable Difficulty moving from lying on back to sitting on the side of the bed? : Unable Difficulty sitting down on and standing up from a chair with arms (e.g., wheelchair, bedside commode, etc,.)?: Unable Help needed moving to and from a bed to chair (including a wheelchair)?: A Lot Help needed walking in hospital room?: Total Help needed climbing 3-5 steps with a railing? : Total 6 Click Score: 7    End of Session Equipment Utilized During Treatment: Gait belt Activity Tolerance: Patient limited by fatigue Patient left: in chair;with call bell/phone within reach;with nursing/sitter in room Nurse Communication: Mobility status;Need for lift equipment PT Visit Diagnosis: Other abnormalities of gait and mobility (R26.89);Muscle weakness (generalized) (M62.81)     Time: 4695-0722 PT Time Calculation (min) (ACUTE ONLY): 17 min  Charges:  $Therapeutic Activity: 8-22 mins                    G Codes:       Governor Rooks, PTA pager [phone]    Cristela Blue 11/30/2017, 12:33 PM

## 2017-12-01 DIAGNOSIS — R0600 Dyspnea, unspecified: Secondary | ICD-10-CM

## 2017-12-01 DIAGNOSIS — R627 Adult failure to thrive: Secondary | ICD-10-CM

## 2017-12-01 LAB — RENAL FUNCTION PANEL
ALBUMIN: 2.1 g/dL — AB (ref 3.5–5.0)
Anion gap: 11 (ref 5–15)
BUN: 26 mg/dL — AB (ref 6–20)
CALCIUM: 14.1 mg/dL — AB (ref 8.9–10.3)
CO2: 21 mmol/L — ABNORMAL LOW (ref 22–32)
Chloride: 107 mmol/L (ref 101–111)
Creatinine, Ser: 1.32 mg/dL — ABNORMAL HIGH (ref 0.44–1.00)
GFR calc Af Amer: 46 mL/min — ABNORMAL LOW (ref >60)
GFR calc non Af Amer: 40 mL/min — ABNORMAL LOW (ref >60)
GLUCOSE: 124 mg/dL — AB (ref 65–99)
PHOSPHORUS: 1.7 mg/dL — AB (ref 2.5–4.6)
Potassium: 3.3 mmol/L — ABNORMAL LOW (ref 3.5–5.1)
SODIUM: 139 mmol/L (ref 135–145)

## 2017-12-01 LAB — GLUCOSE, CAPILLARY
GLUCOSE-CAPILLARY: 110 mg/dL — AB (ref 65–99)
GLUCOSE-CAPILLARY: 130 mg/dL — AB (ref 65–99)
GLUCOSE-CAPILLARY: 112 mg/dL — AB (ref 65–99)

## 2017-12-01 MED ORDER — AMIODARONE HCL 200 MG PO TABS: 200.0000 mg | ORAL_TABLET | Freq: Every day | ORAL | 1 refills | Status: AC

## 2017-12-01 MED ORDER — DILTIAZEM HCL ER COATED BEADS 120 MG PO CP24: 120.0000 mg | ORAL_CAPSULE | Freq: Every day | ORAL | 1 refills | Status: AC

## 2017-12-01 MED ORDER — DIPHENHYDRAMINE HCL 25 MG PO CAPS: 25.0000 mg | ORAL_CAPSULE | Freq: Every evening | ORAL | 0 refills | Status: AC | PRN

## 2017-12-01 MED ORDER — LORAZEPAM 0.5 MG PO TABS: 0.5000 mg | ORAL_TABLET | ORAL | Status: DC | PRN

## 2017-12-01 MED ORDER — MORPHINE SULFATE (CONCENTRATE) 10 MG/0.5ML PO SOLN
5.0000 mg | ORAL | Status: DC | PRN
  Administered 2017-12-01: 5 mg via ORAL
  Filled 2017-12-01: qty 0.5

## 2017-12-01 NOTE — Care Management Note (Signed)
Case Management Note Marvetta Gibbons RN, BSN Unit 4E-Case Manager [phone]  Patient Details  Name: [name] MRN: [id number] Date of Birth: 01/26/1946  Subjective/Objective:    Pt admitted with acute sepsis,  AMS, loculated right pl. Effusion- hx lung CA              Action/Plan: PTA pt lived at home with spouse-  was [facility] with [facility] for HHPT- CM to follow for transition of care needs-   Expected Discharge Date:  12/01/17               Expected Discharge Plan:  [place]  In-House Referral:  NA  Discharge planning Services  CM Consult  Post Acute Care Choice:  Home Health, Resumption of Svcs/PTA Provider, Hospice Choice offered to:  Patient, Spouse, Adult Children  DME Arranged:  [place] DME Agency:  [place] Arranged:  RN, Disease Management [place] Agency:  [facility]ospice and [place]  Status of Service:  Completed, signed off  If discussed at [place] of Stay Meetings, dates discussed:    Discharge Disposition: home/home hospice   Additional Comments:  12/01/17- 1200- Marvetta Gibbons RN, CM- received referral this am for home hospice- pt has chosen [facility] as agency to provide Home hospice services- referral called to Bevely Palmer with [place] with [facility] to f/u with family for home hospice needs- DME to be provided per [facility]. Pt will  Transport home via [facility] once DME in the home later this afternoon. Gold DNR form has been signed and is on shadow chart for transport. CM will assist with transport once DME delivery confirmed. Have notified Butch Penny with [facility] that pt is going home with hospice.   Dawayne Patricia, RN 12/01/2017, 12:14 PM

## 2017-12-01 NOTE — Discharge Summary (Signed)
Physician Discharge Summary  Patient ID: [name] MRN: [id number] DOB/AGE: 71-Mar-1947 71 y.o.  Admit date: 11/19/2017 Discharge date: 12/01/2017  Admission Diagnoses: Altered mental status Sepsis Right pleural effusion, malignant r/o infection Acute atrial fibrillation with RVR, CHA2DS2VASc score of 6 Obesity Type II DM Non-small cell lung CA with metastasis Hypercalcemia of malignancy  Discharge Diagnoses:  Principle problem: *Altered mental status* Active Problems:   Brain metastases (HCC)   Malignant neoplasm (non-small cell CA) of overlapping sites of right lung (HCC)   Acute sepsis (HCC)   Palliative care by specialist   Pleural effusion, malignant   Obesity   Atrial fibrillation, paroxysmal   Type II DM   Hypercalcemia of malignancy  Discharged Condition: poor  Hospital Course: 72 year old female with recent diagnosis of non-small cell CA with mets was confused for few days prior to admission. She had dehydration, lactic acidosis and severe hypercalcemia. She responded to IV fluids, IV antibiotics and IV Zometa. She had large right pleural effusion requiring thoracentesis. Large pleural malignancy was identified by CT scan. She was not a candidate for inpatient cancer treatment. Palliative care consult was obtained. She was sent home for care by family members who are experienced in total care. She will follow up with me in 1 week and as needed.  Consults: cardiology, pulmonary/intensive care and Palliative care  Significant Diagnostic Studies: labs: WBC count 18.2K. Normal Hgb and elevated platelets count. UA was unremarkable. Electrolytes near normal with elevated blood sugar of 180 mg. and Calcium of over 15 mg.   Chest x-ray on admission: Worsening right pleural effusion and right upper lobe mass.  CT head: known metastatic lesion in left superior frontal lobe without hemorrhage and mild decrease in edema.  CT angio-chest: No pulmonary embolism. Very rapid  progression of metastatic disease along pleural space at right hemithorax. 4.1 cm cavitary lesion at right lung apex and enlarged right paratracheal node.  Treatments: IV hydration and cardiac meds: metoprolol, diltiazem, amiodarone and losartan.  Discharge Exam: Blood pressure (!) 148/101, pulse 69, temperature 98 F (36.7 C), temperature source Axillary, resp. rate 20, height 5\' 7"  (1.702 m), weight 89.9 kg (198 lb 3.1 oz), SpO2 93 %. General appearance: awake but disoriented, agitated and appears stated age. Head: Normocephalic, atraumatic. Eyes: Brown eyes, pink conjunctiva, corneas clear. PERRL, EOM's intact.  Neck: No adenopathy, no carotid bruit, no JVD, supple, symmetrical, trachea midline and thyroid not enlarged. Resp: Clearing to auscultation bilaterally. Right sided dullness. Cardio: Regular rate and rhythm, S1, S2 normal, II/VI systolic murmur, no click, rub or gallop. GI: Soft, non-tender; bowel sounds normal; no organomegaly. Extremities: No edema, cyanosis or clubbing. Skin: Warm and dry.  Neurologic: Alert and oriented X 0. Right lower leg flaccid at times.  Disposition: 01-Home or Self Care with hospice.   Allergies as of 12/01/2017      Reactions   Oxycodone Hives, Itching   Tramadol Hcl Itching      Medication List    STOP taking these medications   diltiazem 30 MG tablet Commonly known as:  CARDIZEM   HYDROmorphone 2 MG tablet Commonly known as:  DILAUDID   potassium chloride 10 MEQ tablet Commonly known as:  KLOR-CON M10     TAKE these medications   acetaminophen 500 MG tablet Commonly known as:  TYLENOL Take 1 tablet (500 mg total) every 6 (six) hours as needed by mouth for fever.   VENTOLIN HFA 108 (90 Base) MCG/ACT inhaler Generic drug:  albuterol Inhale 2  puffs into the lungs every 6 (six) hours as needed for wheezing or shortness of breath.   albuterol (2.5 MG/3ML) 0.083% nebulizer solution Commonly known as:  PROVENTIL USE 1 VIAL EVERY 6  HOURS AS NEEDED FOR SHORTNESS OF BREATH   amiodarone 200 MG tablet Commonly known as:  PACERONE Take 1 tablet (200 mg total) by mouth daily.   cholecalciferol 1000 units tablet Commonly known as:  VITAMIN D Take 1,000 Units by mouth daily.   diltiazem 120 MG 24 hr capsule Commonly known as:  CARDIZEM CD Take 1 capsule (120 mg total) by mouth daily.   diphenhydrAMINE 25 mg capsule Commonly known as:  BENADRYL Take 1 capsule (25 mg total) by mouth at bedtime as needed for itching.   esomeprazole 40 MG capsule Commonly known as:  NEXIUM Take 1 capsule (40 mg total) by mouth daily as needed (acid reflux).   HYDROcodone-acetaminophen 5-325 MG tablet Commonly known as:  NORCO/VICODIN Take 1 tablet by mouth every 4 (four) hours as needed for moderate pain or severe pain (As needed for pain.).   JANUMET 50-500 MG tablet Generic drug:  sitaGLIPtin-metformin Take 1 tablet by mouth 2 (two) times daily.   levETIRAcetam 500 MG tablet Commonly known as:  KEPPRA Take 1 tablet (500 mg total) by mouth 2 (two) times daily.   loratadine 10 MG tablet Commonly known as:  CLARITIN Take 10 mg by mouth daily as needed for allergies.   losartan 25 MG tablet Commonly known as:  COZAAR Take 1 tablet (25 mg total) by mouth daily.   meclizine 25 MG tablet Commonly known as:  ANTIVERT Take 1 tablet (25 mg total) by mouth 3 (three) times daily.   metFORMIN 500 MG tablet Commonly known as:  GLUCOPHAGE Take 500 mg by mouth 2 (two) times daily with a meal.   metoprolol succinate 25 MG 24 hr tablet Commonly known as:  TOPROL-XL Take 25 mg by mouth daily.   ONETOUCH VERIO test strip Generic drug:  glucose blood USE AS DIRECTED EVERY DAY   polyethylene glycol packet Commonly known as:  MIRALAX / GLYCOLAX Take 17 g daily as needed by mouth for moderate constipation.   rosuvastatin 10 MG tablet Commonly known as:  CRESTOR Take 1 tablet (10 mg total) by mouth daily.   umeclidinium bromide  62.5 MCG/INH Aepb Commonly known as:  INCRUSE ELLIPTA Inhale 1 puff into the lungs daily.      Follow-up Information    Dixie Dials, MD. Schedule an appointment as soon as possible for a visit in 1 week(s).   Specialty:  Cardiology Contact information: [place] [address] [phone]           Signed: Birdie Riddle 12/01/2017, 10:00 AM

## 2017-12-01 NOTE — Progress Notes (Signed)
Pt d/c'ed home via [facility]. Daughter present w/ pt, paperwork and medlist sent w/ [facility].

## 2017-12-01 NOTE — Progress Notes (Signed)
CRITICAL VALUE ALERT  Critical Value:  Calcium 14.1  Date & Time Notied:  0408 1/9  Provider Notified: Doylene Canard MD  Orders Received/Actions taken: continue NS @ 50. At time of call, pt's midline is occluded. Will attempt another PIV.

## 2017-12-01 NOTE — Progress Notes (Signed)
Family notified staff that equipment is ready for pt to discharge home. [facility] has been arranged. Hospice has been notified.    Grant Fontana BSN, RN

## 2017-12-01 NOTE — Progress Notes (Signed)
Pt had poor effort with MDI. RN at the bedside and aware. RT and RN coached pt with MDI

## 2017-12-01 NOTE — Progress Notes (Signed)
Patient ID: [name], female   DOB: 08-05-1946, 72 y.o.   MRN: [id number]  This NP visited patient at the bedside as for scheduled meeting with husband and family to discuss diagnosis and prognosis, goals of care, disposition and anticipatory care needs.  Patient has had traumatic decline in the past few days.  Today she is minimally responsive but does appear comfortable, she has intermittent agitation.  Patient has verbalized her desire to be at home, family recognize that her time is limited and their goal is to get her home, care for her and her own environment with the assistance of hospice services.  Goals of Care:   Comfort, quality and dignity are the main focus of care -DNR/DNI-documented today -No  artificial feeding or hydration now or in the future -No further diagnostics -Medications for symptom management  All family members verbalize an understanding of what their role will be in the care of this patient as she transitions at end of life.  They are well familiar with hospice services from previous family members.  Will discuss with case management.  Discussed with Dr. Johney Maine is for discharge home today once logistics are put in place  Discussed the natural trajectory and expectations at end of life.   Questions and concerns addressed.  Time in  0745         Time out    0830   Total time spent on the unit was 45 minutes  Greater than 50% of the time was spent in counseling and coordination of care  Wadie Lessen NP  Palliative Medicine Team Team Phone # [phone] Pager [phone]

## 2017-12-01 NOTE — Progress Notes (Addendum)
Hospice and [facility]) Hospital Liaison:  RN visit  Notified by Jari Favre, of patient/family request for [facility] services at home after discharge.  Chart and patient information reviewed by [facility] physician.  Hospice eligibility approved.  Writer spoke with Iona Beard, daughter, at bedside to initiate education related to hospice philosophy, services and team approach to care.  Family verbalized understanding of information given.  Per discussion, plan is for discharge to home by [facility] 12/01/17.  Please send signed and completed DNR form home with patient/family.  Patient will need prescriptions for discharge comfort medications.   DME needs have been discussed, patient currently has the following equipment in the home:  BSC, walker, cane and wheelchair.  Family requests the following DME for delivery to the home:  O2 concentrator, hospital bed 1/2 rails, OBT.  [facility] equipment manager has been notified and will contact [place] to arrange delivery to the home.  Home address has been verified and is correct in the chart.  Clint Lipps, husband, is the family member to contact to arrange time of delivery at [phone].  UPDATE:  DME to be delivered between 400pm and 800pm.   [facility] aware of the above.  Completed discharge summary will need to be faxed to [facility] at [phone], when final.  Please notify [facility] when patient is ready to leave the unit at discharge.  (Call [phone] or [phone] after 5pm).  [facility] information and contact numbers given to [facility] during this visit.  Above information shared with Steffanie Dunn, [facility].   Please call with any hospice related questions.  Thank you for this referral.   Edyth Gunnels, RN, BSN [facility] Liaison [phone] liaisons are now on [place].

## 2017-12-06 DIAGNOSIS — R627 Adult failure to thrive: Secondary | ICD-10-CM

## 2017-12-09 ENCOUNTER — Ambulatory Visit: Payer: Medicare HMO | Admitting: Oncology

## 2017-12-09 ENCOUNTER — Other Ambulatory Visit: Payer: Medicare HMO

## 2017-12-10 ENCOUNTER — Encounter: Payer: Self-pay | Admitting: Medical Oncology

## 2017-12-24 DEATH — deceased

## 2018-01-07 ENCOUNTER — Ambulatory Visit: Payer: Medicare HMO | Admitting: Internal Medicine

## 2018-08-14 IMAGING — CT CT HEAD W/O CM
4 series · 16 of 47 positions shown, 18 images · non-contrast
Comparison: Head CT scan 05/27/2017.  Brain MRI 03/13/2015.

CLINICAL DATA: Sensation of right arm and leg heaviness and
numbness which began last night. History of lung carcinoma.

EXAM:
CT HEAD WITHOUT CONTRAST
TECHNIQUE: Contiguous axial images were obtained from the base of the skull
through the vertex without intravenous contrast.

[Series 3: head without · axial · non-contrast · 0.48mm/px · z∈[-146,-21]mm · 7 of 35 slices shown, 9 images]
[im 5/35  brain]
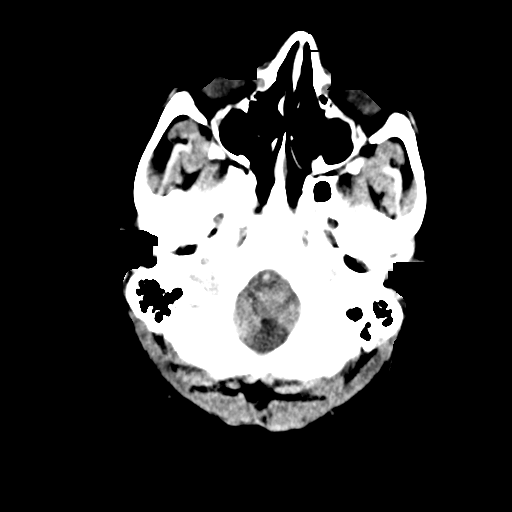
[im 5/35  bone]
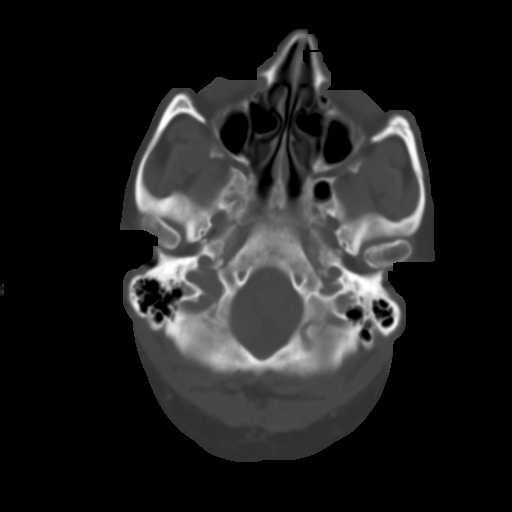
[im 9/35  brain]
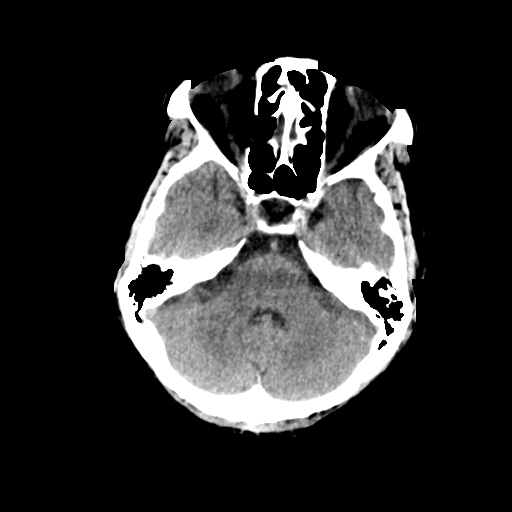
[im 13/35  brain]
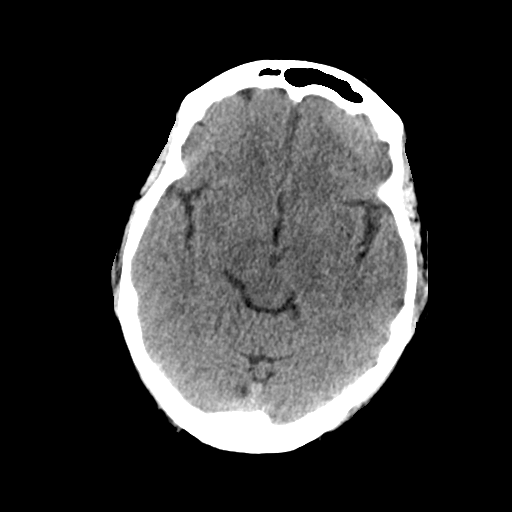
[im 18/35  brain]
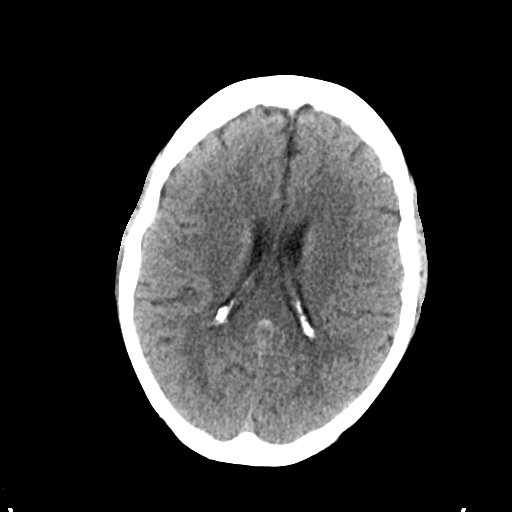
[im 22/35  brain]
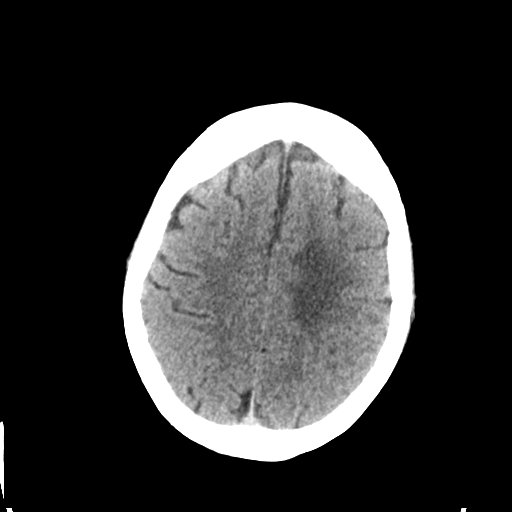
[im 22/35  bone]
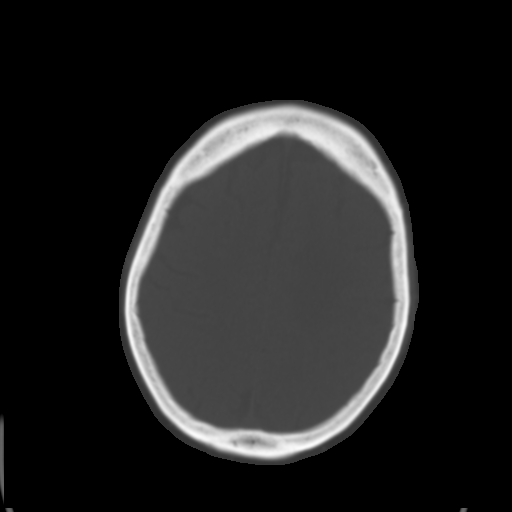
[im 26/35  brain]
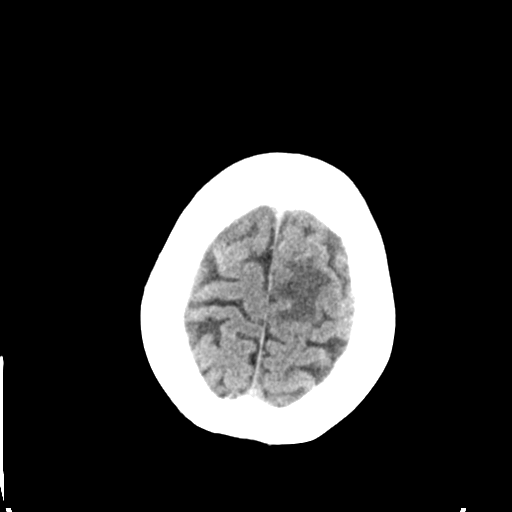
[im 30/35  brain]
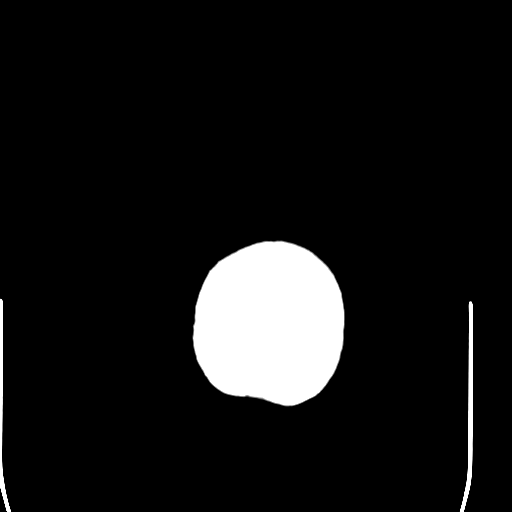

[Series 4: head bone · axial · 0.48mm/px · z∈[-150,-116]mm · 3 of 86 slices shown]
[im 9/86  bone]
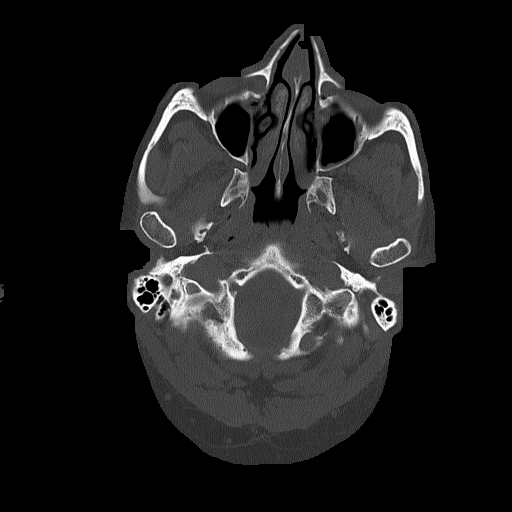
[im 18/86  bone]
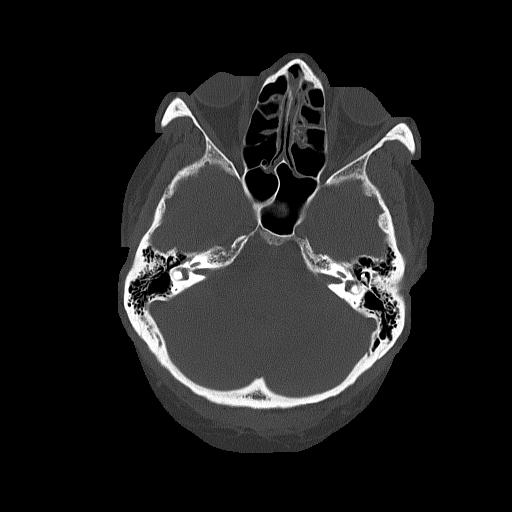
[im 26/86  bone]
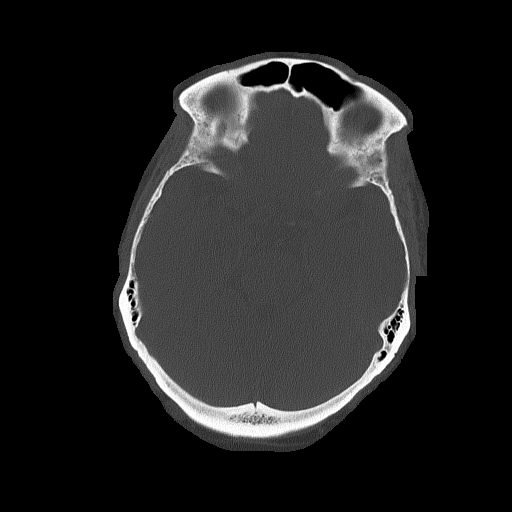

[Series 5: head without cor · coronal · non-contrast · 0.32mm/px · 3 of 74 slices shown]
[im 25/74  brain]
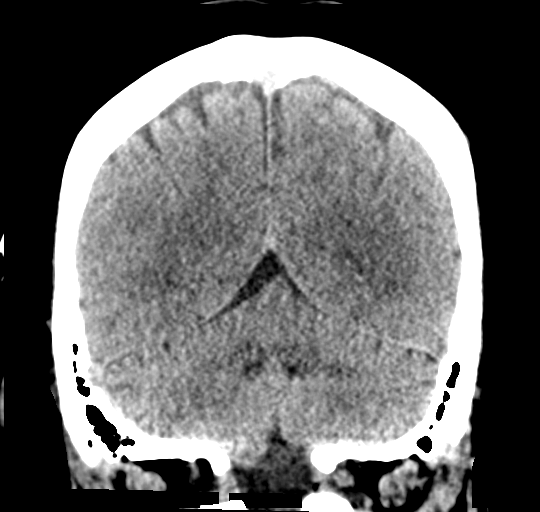
[im 33/74  brain]
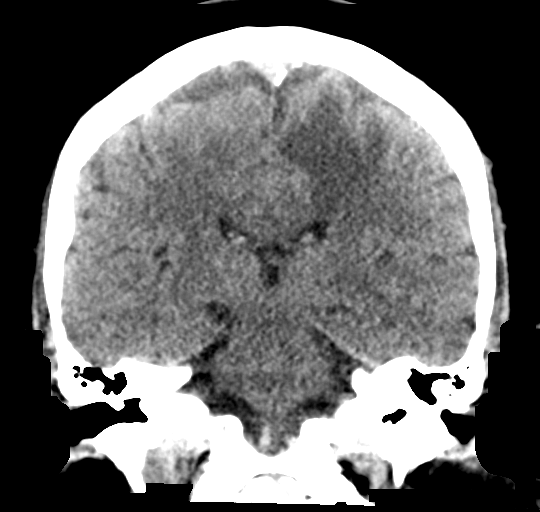
[im 41/74  brain]
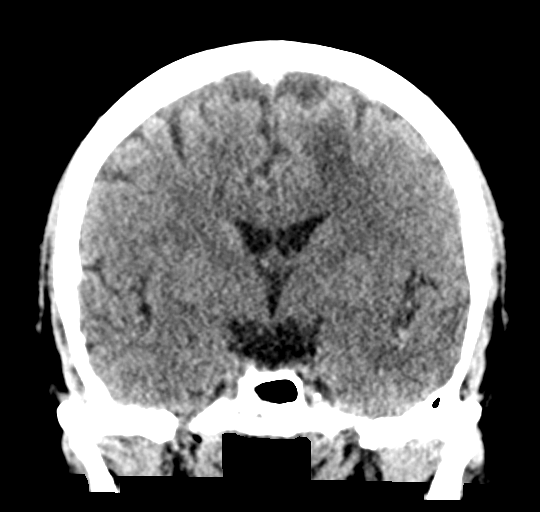

[Series 6: head without sag · sagittal · non-contrast · 0.31mm/px · 3 of 59 slices shown]
[im 20/59  brain]
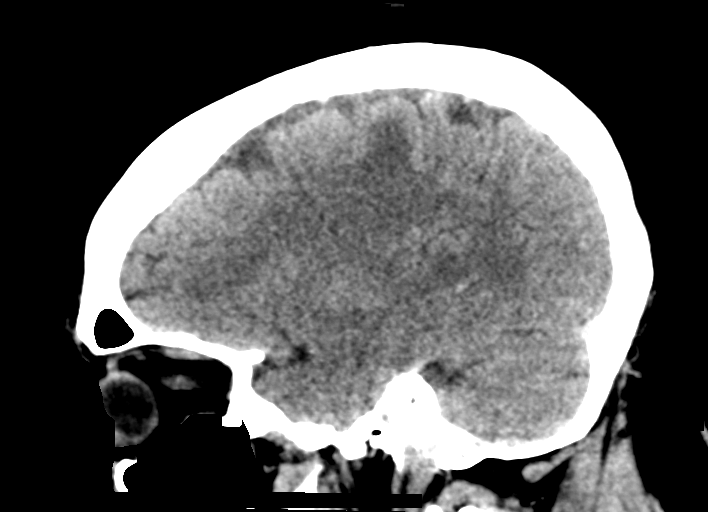
[im 30/59  brain]
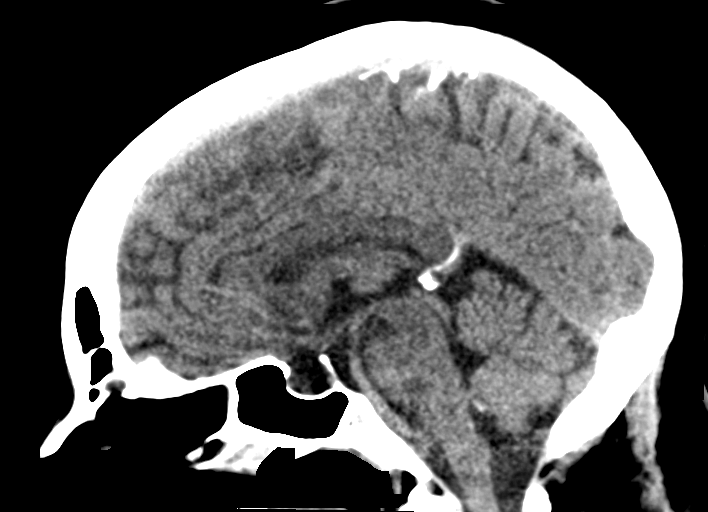
[im 39/59  brain]
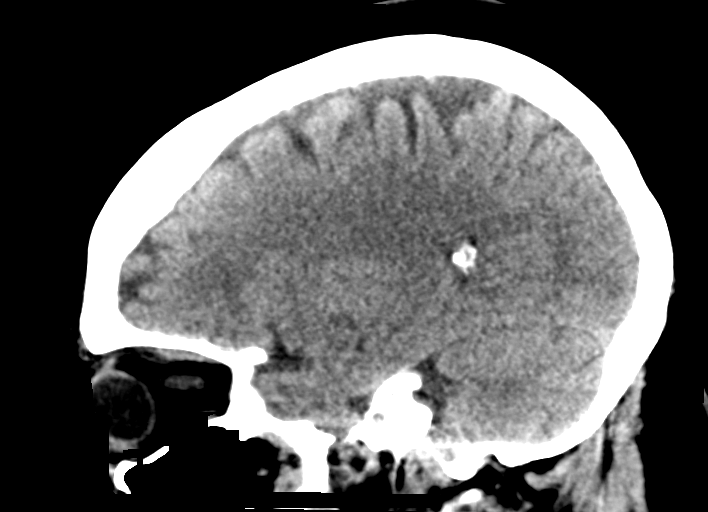

[16 of 47 positions shown; findings below may reference images not displayed]

FINDINGS: Brain: Partially empty sella is noted. There is a new large focus of
hypoattenuation high left frontal lobe with sparing of the gray
matter most consistent with vasogenic edema and presence of a mass
lesion. No evidence of acute infarct, hemorrhage, hydrocephalus,
pneumocephalus or midline shift.

Vascular: Atherosclerosis noted.

Skull: Intact.  No focal lesion.

Sinuses/Orbits: Minimal ethmoid air cell disease is noted.

Other: Negative.
IMPRESSION: Large focus of vasogenic edema in the high left frontal lobe highly
suspicious for metastatic disease in this patient with a history of
lung carcinoma. Brain MRI with and without contrast is recommended
for further evaluation.

These results were called by telephone at the time of interpretation
on 09/09/2017 at [DATE] to Dr. SIMEON RIZVI , who verbally
acknowledged these results.

## 2018-09-11 IMAGING — DX DG SHOULDER 1V*R*
1 series · 1 of 1 positions shown · non-contrast
Comparison: Chest radiograph September 24, 2017

CLINICAL DATA: RIGHT shoulder pain.

EXAM:
RIGHT SHOULDER - 1 VIEW

[shoulder]
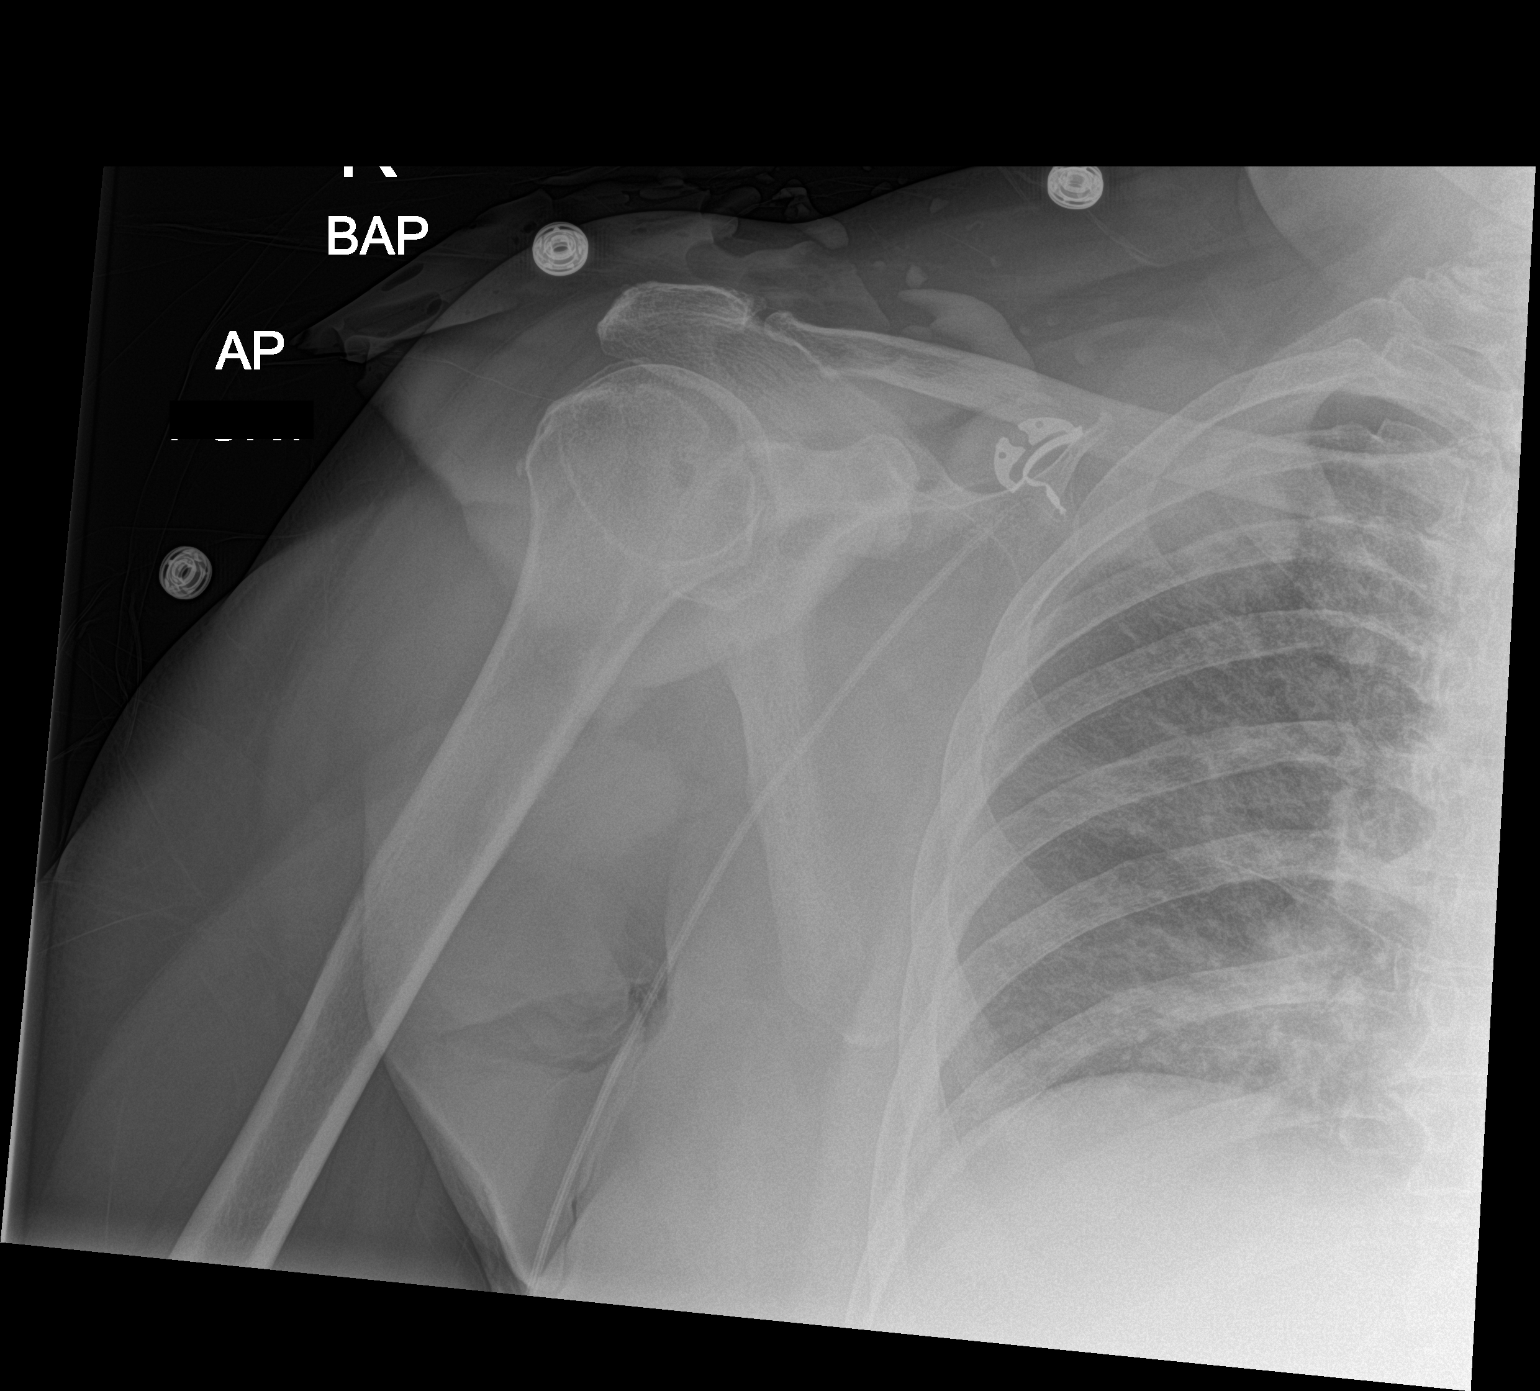

[1 of 1 positions shown; findings below may reference images not displayed]

FINDINGS: No acute fracture deformity or dislocation.. Tiny calcification
projects at greater tuberosity. No advanced arthropathy . No
destructive bony lesions. RIGHT apical lung mass, recently biopsied.
Radiopaque material overlies RIGHT shoulder, recommend direct
inspection.
IMPRESSION: No acute fracture deformity or dislocation. Tiny calcification at
greater tuberosity associated with calcific tendinopathy.

## 2018-10-15 IMAGING — DX DG CHEST 1V
1 series · 1 of 1 positions shown · non-contrast
Comparison: Chest x-ray from yesterday.

CLINICAL DATA: Status post thoracentesis.

EXAM:
CHEST 1 VIEW

[chest pa]
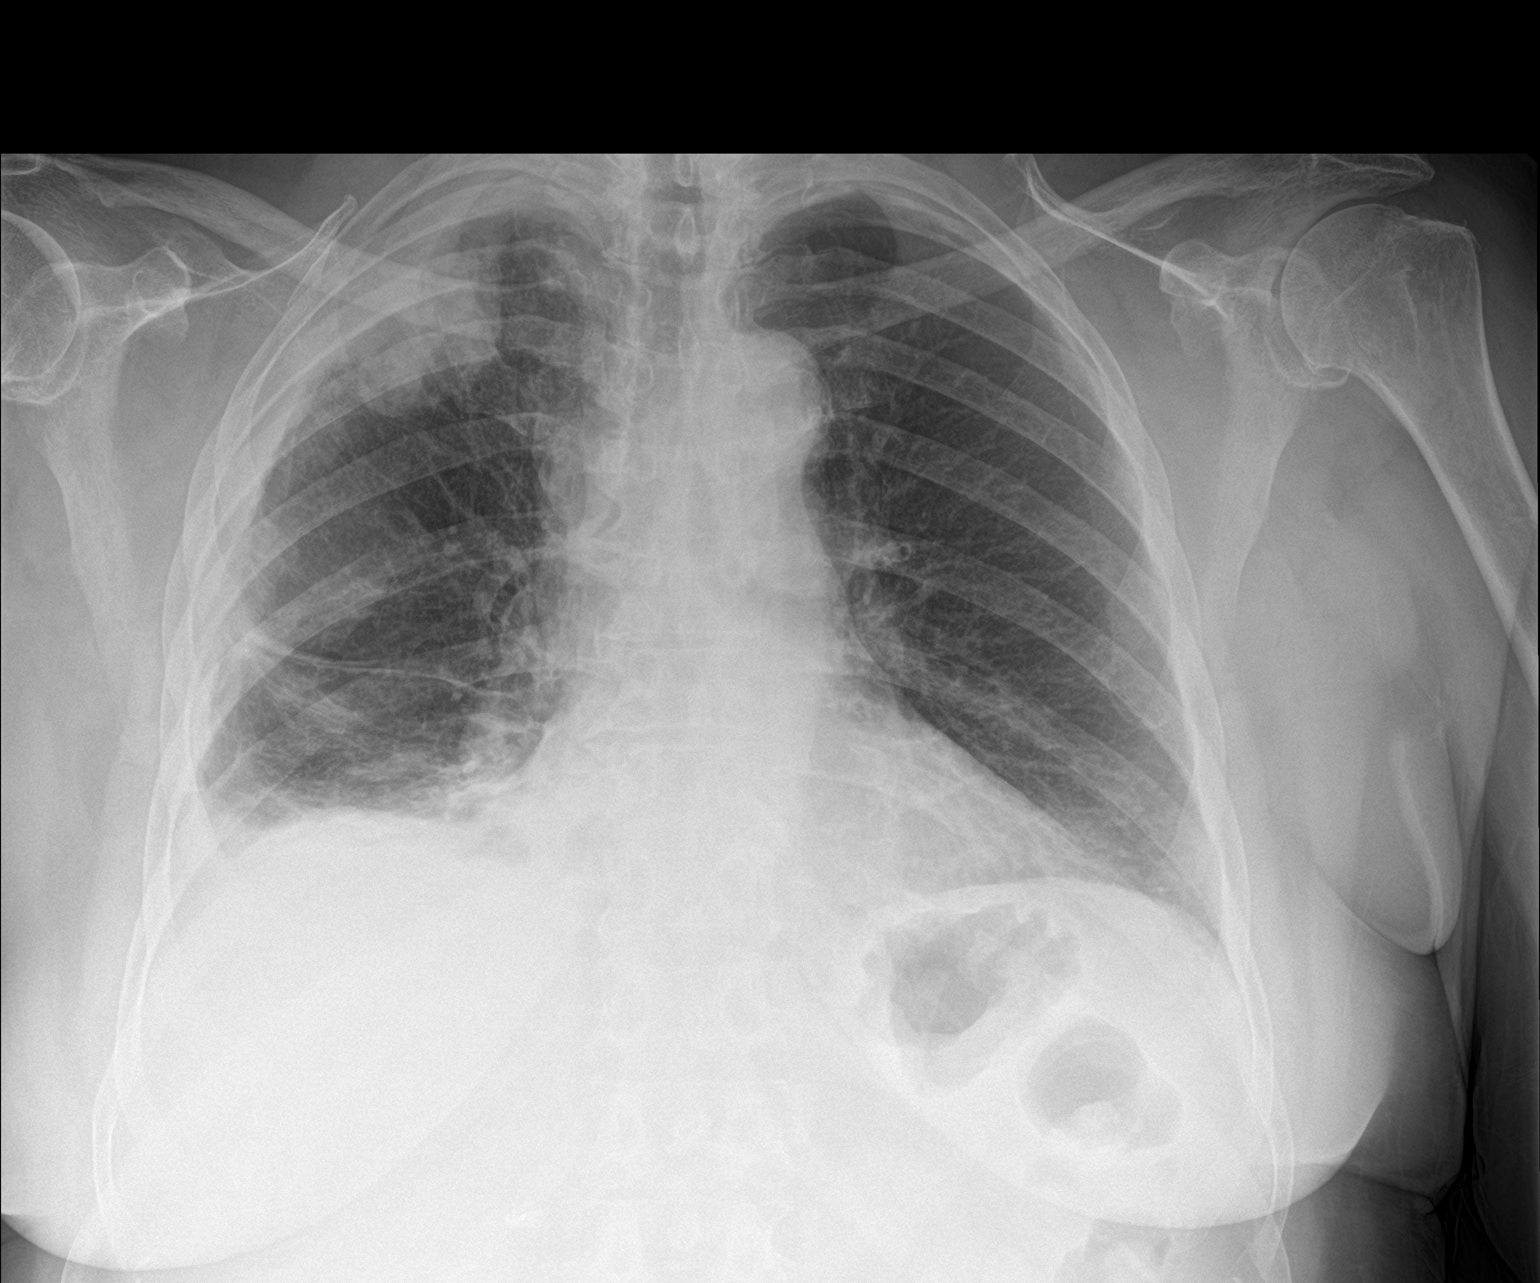

[1 of 1 positions shown; findings below may reference images not displayed]

FINDINGS: Stable cardiomediastinal silhouette. Grossly unchanged right upper
lobe lung mass. Interval decrease in size of now small right pleural
effusion status post thoracentesis. No pneumothorax. Right basilar
atelectasis is slightly improved. The left lung is clear. No acute
osseous abnormality.
IMPRESSION: 1. Interval decrease in size of now small right pleural effusion
status post thoracentesis. No pneumothorax.
2. Unchanged right upper lobe mass.
